# Patient Record
Sex: Male | Born: 1955 | Race: White | Hispanic: No | Smoking: Never smoker
Health system: Southern US, Community
[De-identification: ages and names within clinical notes are randomized; demographics above are authoritative.]

## PROBLEM LIST (undated history)

## (undated) DIAGNOSIS — R001 Bradycardia, unspecified: Secondary | ICD-10-CM

## (undated) DIAGNOSIS — I4719 Other supraventricular tachycardia: Secondary | ICD-10-CM

## (undated) DIAGNOSIS — F1090 Alcohol use, unspecified, uncomplicated: Secondary | ICD-10-CM

## (undated) DIAGNOSIS — R0600 Dyspnea, unspecified: Secondary | ICD-10-CM

## (undated) DIAGNOSIS — I493 Ventricular premature depolarization: Secondary | ICD-10-CM

## (undated) DIAGNOSIS — I491 Atrial premature depolarization: Secondary | ICD-10-CM

## (undated) DIAGNOSIS — D649 Anemia, unspecified: Secondary | ICD-10-CM

## (undated) DIAGNOSIS — I2510 Atherosclerotic heart disease of native coronary artery without angina pectoris: Secondary | ICD-10-CM

## (undated) DIAGNOSIS — Z7289 Other problems related to lifestyle: Secondary | ICD-10-CM

## (undated) DIAGNOSIS — Z9289 Personal history of other medical treatment: Secondary | ICD-10-CM

## (undated) DIAGNOSIS — I77819 Aortic ectasia, unspecified site: Secondary | ICD-10-CM

## (undated) DIAGNOSIS — M109 Gout, unspecified: Secondary | ICD-10-CM

## (undated) DIAGNOSIS — I209 Angina pectoris, unspecified: Secondary | ICD-10-CM

## (undated) DIAGNOSIS — I471 Supraventricular tachycardia: Secondary | ICD-10-CM

## (undated) HISTORY — DX: Atrial premature depolarization: I49.1

## (undated) HISTORY — PX: TONSILLECTOMY: SUR1361

## (undated) HISTORY — DX: Supraventricular tachycardia: I47.1

## (undated) HISTORY — DX: Atherosclerotic heart disease of native coronary artery without angina pectoris: I25.10

## (undated) HISTORY — DX: Other supraventricular tachycardia: I47.19

## (undated) HISTORY — DX: Other problems related to lifestyle: Z72.89

## (undated) HISTORY — DX: Aortic ectasia, unspecified site: I77.819

## (undated) HISTORY — DX: Bradycardia, unspecified: R00.1

## (undated) HISTORY — DX: Ventricular premature depolarization: I49.3

## (undated) HISTORY — DX: Alcohol use, unspecified, uncomplicated: F10.90

## (undated) NOTE — *Deleted (*Deleted)
[facility]   [phone] [phone]   Clinic Follow up Note   Patient Care Team: Deatra James, MD as PCP - General (Family Medicine) Malachy Mood, MD as Consulting Physician (Oncology) Radonna Ricker, RN as Registered Nurse Kathi Der, MD as Consulting Physician (Gastroenterology) Clinton Gallant, RN as [facility] Care Management Anabel Bene, RD as Dietitian (Nutrition) Corliss Skains, MD as Consulting Physician (Cardiothoracic Surgery) Willis Modena, MD as Consulting Physician (Gastroenterology)  Date of Service:  03/10/2020  CHIEF COMPLAINT: F/u ofEsophageal cancer  SUMMARY OF ONCOLOGIC HISTORY: Oncology History Overview Note  Cancer Staging Malignant neoplasm of lower third of esophagus (HCC) Staging form: Esophagus - Squamous Cell Carcinoma, AJCC 8th Edition - Clinical stage from 11/23/2019: Stage Unknown (cTX, cN1, cM0) - Signed by Malachy Mood, MD on 11/26/2019     Malignant neoplasm of lower third of esophagus (HCC)  09/14/2019 Imaging   CT AP W Contrast 09/14/19 IMPRESSION: 1. Large hiatal hernia. 2. Hepatic and bilateral simple renal cysts. 3. Colonic diverticulosis. 4. Small fat containing left inguinal hernia.   11/10/2019 Imaging   CT Chest 11/10/19  IMPRESSION: 1. There is a large mass of the lower third of the esophagus, with ill-defined margins, measuring approximately 6.0 x 5.4 x 7.7 cm. This appears enlarged and more appreciably masslike than appearance on prior CT dated 09/14/2019. 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 2. There are prominent gastrohepatic ligament lymph nodes adjacent to the lesser curvature, concerning for nodal metastatic disease although not ideally imaged on this examination of the chest. 3. No evidence of metastatic disease within in the chest. 4. Coronary artery disease.   11/22/2019 PET scan   PET 11/22/19 IMPRESSION: 1. Intensely  hypermetabolic (max SUV 20.2) lower thoracic esophageal 6.0 x 5.5 x 9.4 cm mass extending to the esophagogastric junction with probable involvement of the gastric cardia, compatible with primary esophageal malignancy. 2. Hypermetabolic gastrohepatic ligament nodal metastases. 3. No hypermetabolic liver or other distant metastases. 4. Chronic findings include: Aortic Atherosclerosis (ICD10-I70.0). Coronary atherosclerosis. Marked diffuse colonic diverticulosis.   11/23/2019 Procedure   EGD with Upper Endoscopy by Dr Dulce Sellar 11/23/19 IMPRESSION - Partially obstructing, likely malignant esophageal tumor was found in the lower third of the esophagus. Biopsied. 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 Biopsied. - Normal duodenal bulb, first portion of the duodenum and second portion of the duodenum.   11/23/2019 Initial Biopsy   FINAL MICROSCOPIC DIAGNOSIS:   A. STOMACH, CARDIA, BIOPSY:  - Invasive well-differentiated squamous cell carcinoma.  See comment   B. ESOPHAGUS, DISTAL, BIOPSY:  - Invasive well-differentiated squamous cell carcinoma.  See comment     COMMENT:   A  B.   Dr. Berneice Heinrich reviewed the case and concurs with the diagnosis.  Dr. Dulce Sellar was paged on 11/24/2019.    11/23/2019 Cancer Staging   Staging form: Esophagus - Squamous Cell Carcinoma, AJCC 8th Edition - Clinical stage from 11/23/2019: Stage Unknown (cTX, cN1, cM0) - Signed by Malachy Mood, MD on 11/26/2019   11/24/2019 Initial Diagnosis   Malignant neoplasm of lower third of esophagus (HCC)   12/06/2019 - 01/03/2020 Chemotherapy   Concurrent chemoradiation with weekly carboplatin and Taxol starting 12/06/19 - 01/13/20, last chemo 01/03/2020, cycle 6 held due to neutropenia   12/07/2019 - 01/13/2020 Radiation Therapy   Concurrent chemoradiation with Dr Mitzi Hansen starting 12/07/19      CURRENT THERAPY:  ***  INTERVAL HISTORY: *** ARIES TOWNLEY is here for a follow  up after surgery. He presents to the clinic alone.     REVIEW OF SYSTEMS:  *** Constitutional: Denies fevers, chills or abnormal weight loss Eyes: Denies blurriness of vision Ears, nose, mouth, throat, and face: Denies mucositis or sore throat Respiratory: Denies cough, dyspnea or wheezes Cardiovascular: Denies palpitation, chest discomfort or lower extremity swelling Gastrointestinal:  Denies nausea, heartburn or change in bowel habits Skin: Denies abnormal skin rashes Lymphatics: Denies new lymphadenopathy or easy bruising Neurological:Denies numbness, tingling or new weaknesses Behavioral/Psych: Mood is stable, no new changes  All other systems were reviewed with the patient and are negative.  MEDICAL HISTORY:  Past Medical History:  Diagnosis Date  . Anemia   . Anemia   . Malignant neoplasm of lower third of esophagus (HCC) 11/24/2019    SURGICAL HISTORY: Past Surgical History:  Procedure Laterality Date  . BIOPSY  11/23/2019   Procedure: BIOPSY;  Surgeon: Willis Modena, MD;  Location: [facility];  Service: Endoscopy;;  . ESOPHAGOGASTRODUODENOSCOPY N/A 02/24/2020   Procedure: ESOPHAGOGASTRODUODENOSCOPY (EGD);  Surgeon: Corliss Skains, MD;  Location[facility];  Service: Thoracic;  Laterality: N/A;  . ESOPHAGOGASTRODUODENOSCOPY (EGD) WITH PROPOFOL N/A 11/23/2019   Procedure: ESOPHAGOGASTRODUODENOSCOPY (EGD) WITH PROPOFOL;  Surgeon: Willis Modena, MD;  Location: [facility];  Service: Endoscopy;  Laterality: N/A;  . INTERCOSTAL NERVE BLOCK Right 02/24/2020   Procedure: INTERCOSTAL NERVE BLOCK;  Surgeon: Corliss Skains, MD;  Location: [facility] OR;  Service: Thoracic;  Laterality: Right;  . TONSILLECTOMY      I have reviewed the social history and family history with the patient and they are unchanged from previous note.  ALLERGIES:  has No Known Allergies.  MEDICATIONS:  No current facility-administered medications for this visit.   No current outpatient medications on file.   Facility-Administered Medications Ordered  in Other Visits  Medication Dose Route Frequency Provider Last Rate Last Admin  . acetaminophen (TYLENOL) 160 MG/5ML solution 650 mg  650 mg Per Tube Q4H PRN Corliss Skains, MD   650 mg at 03/10/20 0340  . enoxaparin (LOVENOX) injection 40 mg  40 mg Subcutaneous Q24H Mosetta Anis, RPH   40 mg at 03/09/20 1758  . feeding supplement (BOOST / RESOURCE BREEZE) liquid 1 Container  1 Container Oral TID BM Corliss Skains, MD   1 Container at 03/10/20 1000  . feeding supplement (OSMOLITE 1.5 CAL) liquid 1,520 mL  1,520 mL Per Tube Q24H Corliss Skains, MD 95 mL/hr at 03/10/20 0406 1,520 mL at 03/10/20 0406  . feeding supplement (PROSource TF) liquid 45 mL  45 mL Per Tube Daily Corliss Skains, MD   45 mL at 03/10/20 0923  . Gerhardt's butt cream   Topical Daily Corliss Skains, MD   Given at 03/08/20 1036  . guaiFENesin-dextromethorphan (ROBITUSSIN DM) 100-10 MG/5ML syrup 5 mL  5 mL Per Tube Q6H PRN Gold, Wayne E, PA-C   5 mL at 03/10/20 0923  . insulin aspart (novoLOG) injection 0-24 Units  0-24 Units Subcutaneous Q4H Rowe Clack, PA-C   2 Units at 03/10/20 0428  . levalbuterol (XOPENEX) nebulizer solution 0.63 mg  0.63 mg Nebulization Q6H PRN Corliss Skains, MD   0.63 mg at 03/06/20 0405  . morphine 2 MG/ML injection 1-4 mg  1-4 mg Intravenous Q1H PRN Gershon Crane E, PA-C   2 mg at 03/09/20 2058  . ondansetron (ZOFRAN) injection 4 mg  4 mg Intravenous Q4H PRN Gold, Wayne E, PA-C      .  pantoprazole (PROTONIX) injection 40 mg  40 mg Intravenous Q12H Gold, Wayne E, PA-C   40 mg at 03/10/20 1610  . piperacillin-tazobactam (ZOSYN) IVPB 3.375 g  3.375 g Intravenous Q8H Lightfoot, Eliezer Lofts, MD 12.5 mL/hr at 03/10/20 0646 3.375 g at 03/10/20 0646  . silver sulfADIAZINE (SILVADENE) 1 % cream   Topical BID Corliss Skains, MD   Given at 03/09/20 2054  . sorbitol 70 % solution 30 mL  30 mL Per Tube Once Doree Fudge M, PA-C      . vancomycin (VANCOREADY) IVPB  1750 mg/350 mL  1,750 mg Intravenous Q12H Corliss Skains, MD 175 mL/hr at 03/10/20 0345 1,750 mg at 03/10/20 0345    PHYSICAL EXAMINATION: ECOG PERFORMANCE STATUS: {CHL ONC ECOG PS:[id number]}  There were no vitals filed for this visit. There were no vitals filed for this visit. *** GENERAL:alert, no distress and comfortable SKIN: skin color, texture, turgor are normal, no rashes or significant lesions EYES: normal, Conjunctiva are pink and non-injected, sclera clear {OROPHARYNX:no exudate, no erythema and lips, buccal mucosa, and tongue normal}  NECK: supple, thyroid normal size, non-tender, without nodularity LYMPH:  no palpable lymphadenopathy in the cervical, axillary {or inguinal} LUNGS: clear to auscultation and percussion with normal breathing effort HEART: regular rate & rhythm and no murmurs and no lower extremity edema ABDOMEN:abdomen soft, non-tender and normal bowel sounds Musculoskeletal:no cyanosis of digits and no clubbing  NEURO: alert & oriented x 3 with fluent speech, no focal motor/sensory deficits  LABORATORY DATA:  I have reviewed the data as listed CBC Latest Ref Rng & Units 03/10/2020 03/09/2020 03/08/2020  WBC 4.0 - 10.5 K/uL 7.2 8.9 12.6(H)  Hemoglobin 13.0 - 17.0 g/dL 9.6(E) 4.5(W) 0.9(W)  Hematocrit 39 - 52 % 29.6(L) 29.6(L) 29.0(L)  Platelets 150 - 400 K/uL 347 379 355     CMP Latest Ref Rng & Units 03/10/2020 03/09/2020 03/08/2020  Glucose 70 - 99 mg/dL 119(J) 478(G) 956(O)  BUN 8 - 23 mg/dL 11 14 18   Creatinine 0.61 - 1.24 mg/dL 1.30 8.65 7.84  Sodium 135 - 145 mmol/L 134(L) 136 136  Potassium 3.5 - 5.1 mmol/L 3.6 4.0 3.7  Chloride 98 - 111 mmol/L 98 99 99  CO2 22 - 32 mmol/L 27 27 28   Calcium 8.9 - 10.3 mg/dL 7.4(L) 7.8(L) 7.8(L)  Total Protein 6.5 - 8.1 g/dL - - -  Total Bilirubin 0.3 - 1.2 mg/dL - - -  Alkaline Phos 38 - 126 U/L - - -  AST 15 - 41 U/L - - -  ALT 0 - 44 U/L - - -      RADIOGRAPHIC STUDIES: I have personally reviewed  the radiological images as listed and agreed with the findings in the report. DG CHEST PORT 1 VIEW  Result Date: 03/10/2020 CLINICAL DATA:  Pleural effusion. EXAM: PORTABLE CHEST 1 VIEW COMPARISON:  CT 03/07/2020.  Chest x-ray 03/07/2020. FINDINGS: Right chest tube in stable position. Left lower chest tube in stable position. Interim placement of a second left chest tube just above the left lower chest tube. Interim improvement of left-sided pleural effusion with residual pleural effusion present. No pneumothorax. Persistent bibasilar atelectasis/infiltrates. Small right pleural effusion. Heart size stable. IMPRESSION: Right chest tube in stable position. Left lower chest tube in stable position. Interim placement of a second left chest tube just above the left lower chest tube. Interim improvement of left-sided pleural effusion with residual moderate pleural effusion present. Persistent bibasilar atelectasis/infiltrates. Electronically Signed   By: Maisie Fus  Register   On: 03/10/2020 07:15   CT IMAGE GUIDED DRAINAGE BY PERCUTANEOUS CATHETER  Result Date: 03/08/2020 INDICATION: 70 year old male with large left hydropneumothorax status post esophagectomy and gastric pull-through procedure. He presents for CT-guided drain placement. EXAM: CT-guided chest tube MEDICATIONS: The patient is currently admitted to the hospital and receiving intravenous antibiotics. The antibiotics were administered within an appropriate time frame prior to the initiation of the procedure. ANESTHESIA/SEDATION: Fentanyl 50 mcg IV; Versed 1 mg IV Moderate Sedation Time:  16 minutes The patient was continuously monitored during the procedure by the interventional radiology nurse under my direct supervision. COMPLICATIONS: None immediate. PROCEDURE: Informed written consent was obtained from the patient after a thorough discussion of the procedural risks, benefits and alternatives. All questions were addressed. Maximal Sterile Barrier  Technique was utilized including caps, mask, sterile gowns, sterile gloves, sterile drape, hand hygiene and skin antiseptic. A timeout was performed prior to the initiation of the procedure. A planning axial CT scan was performed. The large hydropneumothorax was identified in the left hemithorax. A suitable skin entry site was selected and marked. The overlying skin was sterilely prepped and draped in the standard fashion using chlorhexidine skin prep. Local anesthesia was attained by infiltration with 1% lidocaine. A small dermatotomy was made. Under intermittent CT guidance, an 18 gauge trocar needle was advanced into the collection. A 0.035 wire was then advanced into the pleural space. The needle was removed. The skin tract was dilated to 12 [place]. A Cook 12 [place] all-purpose drainage catheter was advanced over the wire and formed in the hydropneumothorax. The catheter was connected to a pleura vac and wall suction. Follow-up CT imaging demonstrates a well-positioned percutaneous chest tube and decreasing hydropneumothorax. The catheter was secured to the skin with 0 Prolene suture. Sterile bandages were applied. IMPRESSION: Successful placement of a left-sided 60 French percutaneous thoracostomy tube. Electronically Signed   By: Malachy Moan M.D.   On: 03/08/2020 16:27     ASSESSMENT & PLAN:  JAMONT MELLIN is a 60 y.o. male with   1.Esophagealsquamous cell carcinoma, in distalesophagus,cTxN1M0 -The patient's work-up began in May 2021 after presenting with symptomatic anemia and syncope. -The patient's colonoscopy and an endoscopy was performed on 11/09/2019. The endoscopy was significant for a large esophageal tumor which is circumferential and non-obstructive around the distal esophagus with invasion into the cardia. The path noted ulcerated, highly atypical squamous cell epithelium. -11/10/19 The patient then had a CT scan of the chest which noted a large mass in the lower third of the  esophagus with ill-defined margins, measuring approximately 6.0 x 5.4 x 7.7 cm. The scan also demonstrated prominent gastrohepatic ligament lymph node adjacent to the lesser curvature. -His 7/19/21PET showed hypermetabolic low thoracic esophageal mass, and hypermetabolic gastrohepatic ligament node,no other node or distant metastasis. -He underwentsecondEGDand biopsyby Dr Dulce Sellar on 11/23/19.Hispathology showed Invasive well-differentiated squamous cell carcinomaof esophaguswhich extend tostomach cardia. -Patient started concurrent chemoradiation with weekly carboplatin and Taxol on8/2/21. He has been tolerating well so far  -S/p week 3 his weight is slowly trending down and his swallowing is improving.  -Labs reviewed, CBC and CMP WNL except WBC 3.3, Hg 10.5, BG 117, albumin 3.3. Overall adequate to proceed with week 3 CT today at same dose.  -Continue RT  -Will schedule EUS with Dr. Dulce Sellar after he completes chemoRT and PET scan 4 weeks after. He will f/u with Surgeon Dr Cliffton Asters after chemoRT -Follow-up weekly   2. Iron deficiency anemia secondary to blood loss -Secondary  to #1 -The patient received blood transfusion on 09/27/19 and 1 dose Feraheme on 09/18/19 while in the hospital for symptomatic anemia.Due to persistent low iron and anemia, he received 2 more IV iron doses in 11/2019. -Anemia now mild and stable.   3. Weight loss and dysphagia -He initially presented with mild-moderate dysphagia. His mass is nonobstructive -The patient reports losing approximately 60 pounds over 6 months since January 2021 -He was seen by dietitian -S/p week 4 his dysphagia is improving and able to eat most solid foods. He will continue sucralfate. His weight is slowly trending down, I encouraged him to continue nutritional supplement and eat enough to maintain weight.     PLAN: -He is tolerating treatment well -Labs reviewed and adequate to proceed with week 4 CT today  -Continue RT   -Follow-up next week -He will call Dr Cliffton Asters to schedule his f/u with him -will send him back to Dr. Dulce Sellar for EUS after last cycle chemoRT   No problem-specific Assessment & Plan notes found for this encounter.   No orders of the defined types were placed in this encounter.  All questions were answered. The patient knows to call the clinic with any problems, questions or concerns. No barriers to learning was detected. The total time spent in the appointment was {CHL ONC TIME VISIT - [id number]}.     Delphina Cahill 03/10/2020   Rogelia Rohrer, am acting as scribe for Malachy Mood, MD.   {Add scribe attestation statement}

---

## 2011-07-29 ENCOUNTER — Emergency Department (HOSPITAL_COMMUNITY)
Admission: EM | Admit: 2011-07-29 | Discharge: 2011-07-29 | Disposition: A | Discharge status: Home / Self Care | Payer: Self-pay | Attending: Emergency Medicine | Admitting: Emergency Medicine

## 2011-07-29 ENCOUNTER — Encounter (HOSPITAL_COMMUNITY): Payer: Self-pay | Admitting: Emergency Medicine

## 2011-07-29 DIAGNOSIS — W261XXA Contact with sword or dagger, initial encounter: Secondary | ICD-10-CM | POA: Insufficient documentation

## 2011-07-29 DIAGNOSIS — F17200 Nicotine dependence, unspecified, uncomplicated: Secondary | ICD-10-CM | POA: Insufficient documentation

## 2011-07-29 DIAGNOSIS — S61412A Laceration without foreign body of left hand, initial encounter: Secondary | ICD-10-CM

## 2011-07-29 DIAGNOSIS — W260XXA Contact with knife, initial encounter: Secondary | ICD-10-CM | POA: Insufficient documentation

## 2011-07-29 DIAGNOSIS — S61409A Unspecified open wound of unspecified hand, initial encounter: Secondary | ICD-10-CM | POA: Insufficient documentation

## 2011-07-29 DIAGNOSIS — Z23 Encounter for immunization: Secondary | ICD-10-CM | POA: Insufficient documentation

## 2011-07-29 MED ORDER — TETANUS-DIPHTH-ACELL PERTUSSIS 5-2.5-18.5 LF-MCG/0.5 IM SUSP
0.5000 mL | Freq: Once | INTRAMUSCULAR | Status: AC
  Administered 2011-07-29: 0.5 mL via INTRAMUSCULAR
  Filled 2011-07-29: qty 0.5

## 2011-07-29 MED ORDER — CEPHALEXIN 500 MG PO CAPS: 500.0000 mg | ORAL_CAPSULE | Freq: Four times a day (QID) | ORAL | Status: AC

## 2011-07-29 NOTE — ED Notes (Signed)
Pt with left hand laceration from razor; bleeding under control; pt sts not utd on TD

## 2011-07-29 NOTE — ED Provider Notes (Signed)
History     CSN: [id number]  Arrival date & time 07/29/11  1335   First MD Initiated Contact with Patient 07/29/11 1552      Chief Complaint  Patient presents with  . Extremity Laceration    (Consider location/radiation/quality/duration/timing/severity/associated sxs/prior treatment) Patient is a 56 y.o. male presenting with skin laceration. The history is provided by the patient.  Laceration  The incident occurred 1 to 2 hours ago. The laceration is located on the left hand. The laceration is 4 cm in size. The laceration mechanism was a a dirty knife. The pain is at a severity of 5/10. The pain is moderate. The pain has been constant since onset. He reports no foreign bodies present. His tetanus status is out of date.    History reviewed. No pertinent past medical history.  History reviewed. No pertinent past surgical history.  History reviewed. No pertinent family history.  History  Substance Use Topics  . Smoking status: Current Some Day Smoker  . Smokeless tobacco: Not on file  . Alcohol Use: Yes      Review of Systems  Constitutional: Negative for fever.  HENT: Negative for congestion, sore throat and neck pain.   Eyes: Negative.   Respiratory: Negative for chest tightness and shortness of breath.   Cardiovascular: Negative for chest pain.  Gastrointestinal: Negative for nausea and abdominal pain.  Genitourinary: Negative.   Musculoskeletal: Negative for joint swelling and arthralgias.  Skin: Positive for wound. Negative for rash.  Neurological: Negative for dizziness, weakness, light-headedness, numbness and headaches.  Hematological: Negative.   Psychiatric/Behavioral: Negative.     Allergies  Review of patient's allergies indicates no known allergies.  Home Medications   Current Outpatient Rx  Name Route Sig Dispense Refill  . IBUPROFEN 200 MG PO TABS Oral Take 200-400 mg by mouth every 6 (six) hours as needed. As needed for back pain.    . CEPHALEXIN  500 MG PO CAPS Oral Take 1 capsule (500 mg total) by mouth 4 (four) times daily. 20 capsule 0    BP 144/75  Pulse 66  Temp 98.3 F (36.8 C)  Resp 18  SpO2 96%  Physical Exam  Nursing note and vitals reviewed. Constitutional: He is oriented to person, place, and time. He appears well-developed and well-nourished.  HENT:  Head: Normocephalic and atraumatic.  Eyes: Conjunctivae are normal.  Neck: Normal range of motion.  Cardiovascular: Normal rate, regular rhythm, normal heart sounds and intact distal pulses.   Pulmonary/Chest: Effort normal and breath sounds normal. He has no wheezes.  Abdominal: Soft. Bowel sounds are normal. There is no tenderness.  Musculoskeletal: Normal range of motion.  Neurological: He is alert and oriented to person, place, and time.  Skin: Skin is warm and dry. No erythema.       4 cm laceration, subcutaneous left palm of hand proximal to index and third fingers.  No tendon involvement.  Patient has full range of motion and full-strength of all of his digits.  He does have lateral numbness to his distal index finger.  Less than 2 second cap refill present.  Psychiatric: He has a normal mood and affect.    ED Course  Procedures (including critical care time)  Labs Reviewed - No data to display No results found.   1. Laceration of left hand    LACERATION REPAIR Performed by: Candis Musa Authorized by: Candis Musa Consent: Verbal consent obtained. Risks and benefits: risks, benefits and alternatives were discussed Consent given by: patient Patient  identity confirmed: provided demographic data Prepped and Draped in normal sterile fashion Wound explored  Laceration Location: Left hand  Laceration Length: 4 cm  No Foreign Bodies seen or palpated  Anesthesia: local infiltration  Local anesthetic: lidocaine 2 % with epinephrine  Anesthetic total: 4 ml  Irrigation method: syringe Amount of cleaning: standard  Skin closure:  suture  Number of sutures: 8 simple interrupted   Technique: Simple interrupted   Patient tolerance: Patient tolerated the procedure well with no immediate complications.    MDM  Patient's tetanus was updated today.  He was prescribed Keflex 500 mg 4 times a day for 5 days.  Suture removal in 10 days at our urgent care Center, return sooner for any signs of infection including redness swelling increased pain or drainage of pus.        Candis Musa, PA 07/29/11 1706

## 2011-07-29 NOTE — ED Notes (Signed)
Discharge instructions reviewed with pt; verbalizes understanding.  No allergic reaction noted from tetanus shot.  No questions asked; no further c/o's voiced.  Pt ambulatory to lobby.  NAD noted.

## 2011-07-29 NOTE — Discharge Instructions (Signed)

## 2011-07-31 NOTE — ED Provider Notes (Signed)
Medical screening examination/treatment/procedure(s) were performed by non-physician practitioner and as supervising physician I was immediately available for consultation/collaboration.   Carleene Cooper III, MD 07/31/11 1014

## 2011-08-11 ENCOUNTER — Emergency Department (INDEPENDENT_AMBULATORY_CARE_PROVIDER_SITE_OTHER)
Admission: EM | Admit: 2011-08-11 | Discharge: 2011-08-11 | Disposition: A | Discharge status: Home / Self Care | Payer: Self-pay | Source: Home / Self Care | Attending: Family Medicine | Admitting: Family Medicine

## 2011-08-11 ENCOUNTER — Encounter (HOSPITAL_COMMUNITY): Payer: Self-pay | Admitting: *Deleted

## 2011-08-11 DIAGNOSIS — Z4802 Encounter for removal of sutures: Secondary | ICD-10-CM

## 2011-08-11 NOTE — Discharge Instructions (Signed)
Staple Removal  Care After  The staples used to close your skin have been removed. The wound needs continued care so it can heal completely and without problems. The care described here will need to be done for another 5-10 days unless your caregiver advises otherwise.   HOME CARE INSTRUCTIONS    Keep wound site dry and clean.   If skin adhesive strips were applied after the staples were removed, they will begin to peel off in a few days. If they remain after fourteen days, they may be peeled off and discarded.   If you still have a dressing, change it at least once a day or as instructed by your caregiver. If the bandage sticks, soak it off with warm water. Pat dry with a clean towel. Look for signs of infection (see below).   Reapply cream or ointment according to your caregiver's instruction. This will help prevent infection and keep the bandage from sticking. Use of a non-stick material over the wound and under the dressing or wrap will also help keep the bandage from sticking.   If the bandage becomes wet, dirty or develops a foul smell, change it as soon as possible.   New scars become sunburned easily. Use sunscreens with protection factor (SPF) of at least 15 when out in the sun.   Only take over-the-counter or prescription medicines for pain, discomfort or fever as directed by your caregiver.  SEEK IMMEDIATE MEDICAL CARE IF:    There is redness, swelling or increasing pain in the wound.   Pus is coming from the wound.   An unexplained oral temperature above 102 F (38.9 C) develops.   You notice a foul smell coming from the wound or dressing.   There is a breaking open of the suture line (edges not staying together) of the wound edges after staples have been removed.  Document Released: 04/04/2008 Document Revised: 04/11/2011 Document Reviewed: 04/04/2008  [employer] Patient Information 2012 [employer]

## 2011-08-11 NOTE — ED Provider Notes (Signed)
History     CSN: [id number]  Arrival date & time 08/11/11  1057   First MD Initiated Contact with Patient 08/11/11 1108      Chief Complaint  Patient presents with  . Suture / Staple Removal    (Consider location/radiation/quality/duration/timing/severity/associated sxs/prior treatment) Patient is a 56 y.o. male presenting with suture removal. The history is provided by the patient.  Suture / Staple Removal  The sutures were placed 11 to 14 days ago. There has been no treatment since the wound repair. There has been no drainage from the wound. There is no redness (Point finger left hand with numbness) present.    History reviewed. No pertinent past medical history.  History reviewed. No pertinent past surgical history.  History reviewed. No pertinent family history.  History  Substance Use Topics  . Smoking status: Never Smoker   . Smokeless tobacco: Not on file  . Alcohol Use: Yes      Review of Systems  All other systems reviewed and are negative.    Allergies  Review of patient's allergies indicates no known allergies.  Home Medications   Current Outpatient Rx  Name Route Sig Dispense Refill  . IBUPROFEN 200 MG PO TABS Oral Take 200-400 mg by mouth every 6 (six) hours as needed. As needed for back pain.      BP 129/89  Pulse 70  Temp(Src) 98.1 F (36.7 C) (Oral)  Resp 16  SpO2 96%  Physical Exam  Constitutional: He is oriented to person, place, and time. He appears well-developed and well-nourished.  Musculoskeletal: Normal range of motion.       Hand healing well. Sutures intact . Patient indicates that some come out on their own.  Neurological: He is alert and oriented to person, place, and time.  Skin: Skin is warm and dry.  Psychiatric: He has a normal mood and affect.    ED Course  SUTURE REMOVAL Performed by: Hassan Rowan Authorized by: Hassan Rowan Consent: Verbal consent not obtained. Written consent not obtained. Body area: upper  extremity Location details: left hand Wound Appearance: clean Sutures Removed: 5 Patient tolerance: Patient tolerated the procedure well with no immediate complications.   (including critical care time)  cfollow up as needed  MDM          Hassan Rowan, MD 08/11/11 2151

## 2011-08-11 NOTE — ED Notes (Cosign Needed)
Sutures palm left hand 3/25 [place] ed - per pt 2 sutures fell out - no redness or swelling at site

## 2017-05-20 ENCOUNTER — Ambulatory Visit: Payer: Self-pay

## 2017-05-20 NOTE — Telephone Encounter (Signed)
Patient called in with "back pain." He has an appointment for 05/21/17 already scheduled for this issue. He asked "what medication can I take, Tylenol, Advil, Aleve?" I advised he can take either one. He said "I've been taking Advil, so I will just keep doing that."

## 2017-05-21 ENCOUNTER — Other Ambulatory Visit: Payer: Self-pay

## 2017-05-21 ENCOUNTER — Encounter: Payer: Self-pay | Admitting: Physician Assistant

## 2017-05-21 ENCOUNTER — Ambulatory Visit: Payer: BLUE CROSS/BLUE SHIELD | Admitting: Physician Assistant

## 2017-05-21 VITALS — BP 148/92 | HR 76 | Temp 98.4°F | Resp 16 | Ht 71.0 in | Wt 302.0 lb

## 2017-05-21 DIAGNOSIS — M79604 Pain in right leg: Secondary | ICD-10-CM | POA: Diagnosis not present

## 2017-05-21 DIAGNOSIS — M5431 Sciatica, right side: Secondary | ICD-10-CM | POA: Diagnosis not present

## 2017-05-21 DIAGNOSIS — M545 Low back pain, unspecified: Secondary | ICD-10-CM

## 2017-05-21 LAB — POCT URINALYSIS DIP (MANUAL ENTRY)
Bilirubin, UA: NEGATIVE
Blood, UA: NEGATIVE
Glucose, UA: NEGATIVE mg/dL
Ketones, POC UA: NEGATIVE mg/dL
Leukocytes, UA: NEGATIVE
Nitrite, UA: NEGATIVE
Spec Grav, UA: >=1.03 — AB (ref 1.010–1.025)
Urobilinogen, UA: 2 E.U./dL — AB
pH, UA: 5.5 (ref 5.0–8.0)

## 2017-05-21 LAB — POCT GLYCOSYLATED HEMOGLOBIN (HGB A1C): Hemoglobin A1C: 5.6

## 2017-05-21 LAB — POC MICROSCOPIC URINALYSIS (UMFC): Mucus: ABSENT

## 2017-05-21 MED ORDER — TRAMADOL HCL 50 MG PO TABS: 50.0000 mg | ORAL_TABLET | Freq: Three times a day (TID) | ORAL | 0 refills | Status: DC | PRN

## 2017-05-21 MED ORDER — PREDNISONE 20 MG PO TABS: ORAL_TABLET | ORAL | 0 refills | Status: DC

## 2017-05-21 MED ORDER — MELOXICAM 15 MG PO TABS: 15.0000 mg | ORAL_TABLET | Freq: Every day | ORAL | 0 refills | Status: DC

## 2017-05-21 NOTE — Patient Instructions (Addendum)
Prednisone is a steroid - this will help reduce inflammation. Take this as directed (three days each of 60 mg, 40 mg, and 20 mg per day) Meloxicam is an NSAID - this will also help with inflammation. Do not use with any other otc pain medication other than tylenol/acetaminophen - so no aleve, ibuprofen, motrin, advil, etc. Tramadol is an opoid pain medication. This may make you drowsy. Do not take this if you need to work or drive.   Activity modification is as important as pain relieving treatment in the management of sciatica. The goals are to lessen nerve root impingement and to avoid activities that make your pain worse.  Come back and see me if you are not improving in 3-4 weeks.    Thank you for coming in today. I hope you feel we met your needs. Feel free to call PCP if you have any questions or further requests. Please consider signing up for MyChart if you do not already have it, as this is a great way to communicate with me.  Best,  Whitney McVey, PA-C   Sciatica Sciatica is pain, numbness, weakness, or tingling along the path of the sciatic nerve. The sciatic nerve starts in the lower back and runs down the back of each leg. The nerve controls the muscles in the lower leg and in the back of the knee. It also provides feeling (sensation) to the back of the thigh, the lower leg, and the sole of the foot. Sciatica is a symptom of another medical condition that pinches or puts pressure on the sciatic nerve. Generally, sciatica only affects one side of the body. Sciatica usually goes away on its own or with treatment. In some cases, sciatica may keep coming back (recur). What are the causes? This condition is caused by pressure on the sciatic nerve, or pinching of the sciatic nerve. This may be the result of:  A disk in between the bones of the spine (vertebrae) bulging out too far (herniated disk).  Age-related changes in the spinal disks (degenerative disk disease).  A pain disorder  that affects a muscle in the buttock (piriformis syndrome).  Extra bone growth (bone spur) near the sciatic nerve.  An injury or break (fracture) of the pelvis.  Pregnancy.  Tumor (rare).  What increases the risk? The following factors may make you more likely to develop this condition:  Playing sports that place pressure or stress on the spine, such as football or weight lifting.  Having poor strength and flexibility.  A history of back injury.  A history of back surgery.  Sitting for long periods of time.  Doing activities that involve repetitive bending or lifting.  Obesity.  What are the signs or symptoms? Symptoms can vary from mild to very severe, and they may include:  Any of these problems in the lower back, leg, hip, or buttock: ? Mild tingling or dull aches. ? Burning sensations. ? Sharp pains.  Numbness in the back of the calf or the sole of the foot.  Leg weakness.  Severe back pain that makes movement difficult.  These symptoms may get worse when you cough, sneeze, or laugh, or when you sit or stand for long periods of time. Being overweight may also make symptoms worse. In some cases, symptoms may recur over time. How is this diagnosed? This condition may be diagnosed based on:  Your symptoms.  A physical exam. Your health care provider may ask you to do certain movements to check whether  those movements trigger your symptoms.  You may have tests, including: ? Blood tests. ? X-rays. ? MRI. ? CT scan.  How is this treated? In many cases, this condition improves on its own, without any treatment. However, treatment may include:  Reducing or modifying physical activity during periods of pain.  Exercising and stretching to strengthen your abdomen and improve the flexibility of your spine.  Icing and applying heat to the affected area.  Medicines that help: ? To relieve pain and swelling. ? To relax your muscles.  Injections of medicines  that help to relieve pain, irritation, and inflammation around the sciatic nerve (steroids).  Surgery.  Follow these instructions at home: Medicines  Take over-the-counter and prescription medicines only as told by your health care provider.  Do not drive or operate heavy machinery while taking prescription pain medicine. Managing pain  If directed, apply ice to the affected area. ? Put ice in a plastic bag. ? Place a towel between your skin and the bag. ? Leave the ice on for 20 minutes, 2-3 times a day.  After icing, apply heat to the affected area before you exercise or as often as told by your health care provider. Use the heat source that your health care provider recommends, such as a moist heat pack or a heating pad. ? Place a towel between your skin and the heat source. ? Leave the heat on for 20-30 minutes. ? Remove the heat if your skin turns bright red. This is especially important if you are unable to feel pain, heat, or cold. You may have a greater risk of getting burned. Activity  Return to your normal activities as told by your health care provider. Ask your health care provider what activities are safe for you. ? Avoid activities that make your symptoms worse.  Take brief periods of rest throughout the day. Resting in a lying or standing position is usually better than sitting to rest. ? When you rest for longer periods, mix in some mild activity or stretching between periods of rest. This will help to prevent stiffness and pain. ? Avoid sitting for long periods of time without moving. Get up and move around at least one time each hour.  Exercise and stretch regularly, as told by your health care provider.  Do not lift anything that is heavier than 10 lb (4.5 kg) while you have symptoms of sciatica. When you do not have symptoms, you should still avoid heavy lifting, especially repetitive heavy lifting.  When you lift objects, always use proper lifting technique,  which includes: ? Bending your knees. ? Keeping the load close to your body. ? Avoiding twisting. General instructions  Use good posture. ? Avoid leaning forward while sitting. ? Avoid hunching over while standing.  Maintain a healthy weight. Excess weight puts extra stress on your back and makes it difficult to maintain good posture.  Wear supportive, comfortable shoes. Avoid wearing high heels.  Avoid sleeping on a mattress that is too soft or too hard. A mattress that is firm enough to support your back when you sleep may help to reduce your pain.  Keep all follow-up visits as told by your health care provider. This is important. Contact a health care provider if:  You have pain that wakes you up when you are sleeping.  You have pain that gets worse when you lie down.  Your pain is worse than you have experienced in the past.  Your pain lasts longer than 4  weeks.  You experience unexplained weight loss. Get help right away if:  You lose control of your bowel or bladder (incontinence).  You have: ? Weakness in your lower back, pelvis, buttocks, or legs that gets worse. ? Redness or swelling of your back. ? A burning sensation when you urinate. This information is not intended to replace advice given to you by your health care provider. Make sure you discuss any questions you have with your health care provider. Document Released: 04/16/2001 Document Revised: 09/26/2015 Document Reviewed: 12/30/2014 Elsevier Interactive Patient Education  2018 [employer].   IF you received an x-ray today, you will receive an invoice from [facility]. Please contact [facility] at [phone] with questions or concerns regarding your invoice.   IF you received labwork today, you will receive an invoice from [place]. Please contact [facility] at [phone] with questions or concerns regarding your invoice.   Our billing staff will not be able to assist you with  questions regarding bills from these companies.  You will be contacted with the lab results as soon as they are available. The fastest way to get your results is to activate your My Chart account. Instructions are located on the last page of this paperwork. If you have not heard from [place] regarding the results in 2 weeks, please contact this office.

## 2017-05-21 NOTE — Progress Notes (Signed)
Brett Dawson  MRN: [id number] DOB: November 25, 1955  PCP: Default, Provider, MD  Subjective:  Pt is a 62 year old male who presents to clinic for right low back pain x 5 days. Pain radiates down right leg. He has a tough time finding a position of comfort. Pain is 8/10. Pain is constant. Nothing makes it better. Has not taken anything to feel better.  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 Pain is worsening over the past few days. No MOI.  He had sciatica 20 years ago.  He has appt next week at [facility] physicians for a check up. Has not seen medical provider in about 20 years.  Denies muscle weakness, saddle anesthesia, loss of bowel or bladder function, n/t LES.   Review of Systems  Constitutional: Negative for diaphoresis and fatigue.  Musculoskeletal: Positive for arthralgias (right leg), back pain and gait problem.  Skin: Negative.   Neurological: Negative for weakness and numbness.  Psychiatric/Behavioral: Positive for sleep disturbance.    There are no active problems to display for this patient.   Current Outpatient Medications on File Prior to Visit  Medication Sig Dispense Refill  . ibuprofen (ADVIL,MOTRIN) 200 MG tablet Take 200-400 mg by mouth every 6 (six) hours as needed. As needed for back pain.     No current facility-administered medications on file prior to visit.     No Known Allergies   Objective:  BP (!) 148/92   Pulse 76   Temp 98.4 F (36.9 C) (Oral)   Resp 16   Ht 5\' 11"  (1.803 m)   Wt (!) 302 lb (137 kg)   SpO2 95%   BMI 42.12 kg/m   Physical Exam  Constitutional: He is oriented to person, place, and time and well-developed, well-nourished, and in no distress. No distress.  obese  Musculoskeletal:       Lumbar back: He exhibits normal range of motion, no tenderness, no bony tenderness, no pain and no spasm.  Neurological: He is alert and oriented to person, place, and time. He has normal sensation and normal  strength. He has an abnormal Straight Leg Raise Test (right leg). GCS score is 15.  Pt lies supine with knees bent to alleviate back pain. Appears comfortable in seated position.   Skin: Skin is warm and dry.  Psychiatric: Mood, memory, affect and judgment normal.  Vitals reviewed.  Results for orders placed or performed in visit on 05/21/17  POCT urinalysis dipstick  Result Value Ref Range   Color, UA yellow yellow   Clarity, UA clear clear   Glucose, UA negative negative mg/dL   Bilirubin, UA negative negative   Ketones, POC UA negative negative mg/dL   Spec Grav, UA >=3.086 (A) 1.010 - 1.025   Blood, UA negative negative   pH, UA 5.5 5.0 - 8.0   Protein Ur, POC trace (A) negative mg/dL   Urobilinogen, UA 2.0 (A) 0.2 or 1.0 E.U./dL   Nitrite, UA Negative Negative   Leukocytes, UA Negative Negative  POCT glycosylated hemoglobin (Hb A1C)  Result Value Ref Range   Hemoglobin A1C 5.6     Assessment and Plan :  1. Sciatica of right side - meloxicam (MOBIC) 15 MG tablet; Take 1 tablet (15 mg total) by mouth daily.  Dispense: 30 tablet; Refill: 0 - traMADol (ULTRAM) 50 MG tablet; Take 1 tablet (50 mg total) by mouth every 8 (eight) hours as needed.  Dispense: 30 tablet; Refill:  0 - predniSONE (DELTASONE) 20 MG tablet; Take 3 PO QAM x3days, 2 PO QAM x3days, 1 PO QAM x3days  Dispense: 18 tablet; Refill: 0 - Pt c/o right leg pain. +SLR on PE, plan to treat for sciatica. Discussed activity modifications. POCT A1C is 5.6%. UA is negative for UTI.  RTC in 3-4 weeks if no improvement. Consider referral or imaging.  2. Low back pain radiating to right lower extremity 3. Pain of right lower extremity - POCT urinalysis dipstick - POCT Microscopic Urinalysis ([facility]) - POCT glycosylated hemoglobin (Hb A1C)   Whitney Nickalas Mccarrick, PA-C  Primary [facility] at [facility] 05/21/2017 10:20 AM

## 2017-05-26 DIAGNOSIS — M5431 Sciatica, right side: Secondary | ICD-10-CM | POA: Diagnosis not present

## 2017-05-26 DIAGNOSIS — R062 Wheezing: Secondary | ICD-10-CM | POA: Diagnosis not present

## 2017-05-26 DIAGNOSIS — R030 Elevated blood-pressure reading, without diagnosis of hypertension: Secondary | ICD-10-CM | POA: Diagnosis not present

## 2018-08-17 DIAGNOSIS — M62838 Other muscle spasm: Secondary | ICD-10-CM | POA: Diagnosis not present

## 2019-02-26 DIAGNOSIS — K219 Gastro-esophageal reflux disease without esophagitis: Secondary | ICD-10-CM | POA: Diagnosis not present

## 2019-09-14 ENCOUNTER — Emergency Department (HOSPITAL_COMMUNITY)
Admission: EM | Admit: 2019-09-14 | Discharge: 2019-09-14 | Disposition: A | Discharge status: Home / Self Care | Payer: 59 | Attending: Emergency Medicine | Admitting: Emergency Medicine

## 2019-09-14 ENCOUNTER — Other Ambulatory Visit: Payer: Self-pay

## 2019-09-14 ENCOUNTER — Emergency Department (HOSPITAL_COMMUNITY): Payer: 59

## 2019-09-14 ENCOUNTER — Encounter (HOSPITAL_COMMUNITY): Payer: Self-pay | Admitting: Emergency Medicine

## 2019-09-14 DIAGNOSIS — D649 Anemia, unspecified: Secondary | ICD-10-CM | POA: Diagnosis not present

## 2019-09-14 DIAGNOSIS — R112 Nausea with vomiting, unspecified: Secondary | ICD-10-CM | POA: Insufficient documentation

## 2019-09-14 DIAGNOSIS — K7689 Other specified diseases of liver: Secondary | ICD-10-CM | POA: Insufficient documentation

## 2019-09-14 DIAGNOSIS — K449 Diaphragmatic hernia without obstruction or gangrene: Secondary | ICD-10-CM | POA: Diagnosis not present

## 2019-09-14 DIAGNOSIS — N281 Cyst of kidney, acquired: Secondary | ICD-10-CM | POA: Insufficient documentation

## 2019-09-14 DIAGNOSIS — Z79899 Other long term (current) drug therapy: Secondary | ICD-10-CM | POA: Diagnosis not present

## 2019-09-14 LAB — COMPREHENSIVE METABOLIC PANEL
ALT: 16 U/L (ref 0–44)
AST: 19 U/L (ref 15–41)
Albumin: 3.3 g/dL — ABNORMAL LOW (ref 3.5–5.0)
Alkaline Phosphatase: 62 U/L (ref 38–126)
Anion gap: 12 (ref 5–15)
BUN: 16 mg/dL (ref 8–23)
CO2: 24 mmol/L (ref 22–32)
Calcium: 8.9 mg/dL (ref 8.9–10.3)
Chloride: 100 mmol/L (ref 98–111)
Creatinine, Ser: 0.84 mg/dL (ref 0.61–1.24)
GFR calc Af Amer: >60 mL/min (ref >60)
GFR calc non Af Amer: >60 mL/min (ref >60)
Glucose, Bld: 104 mg/dL — ABNORMAL HIGH (ref 70–99)
Potassium: 3.9 mmol/L (ref 3.5–5.1)
Sodium: 136 mmol/L (ref 135–145)
Total Bilirubin: 0.7 mg/dL (ref 0.3–1.2)
Total Protein: 7.2 g/dL (ref 6.5–8.1)

## 2019-09-14 LAB — CBC
HCT: 22.4 % — ABNORMAL LOW (ref 39.0–52.0)
Hemoglobin: 6.7 g/dL — CL (ref 13.0–17.0)
MCH: 24.3 pg — ABNORMAL LOW (ref 26.0–34.0)
MCHC: 29.9 g/dL — ABNORMAL LOW (ref 30.0–36.0)
MCV: 81.2 fL (ref 80.0–100.0)
Platelets: 417 10*3/uL — ABNORMAL HIGH (ref 150–400)
RBC: 2.76 MIL/uL — ABNORMAL LOW (ref 4.22–5.81)
RDW: 15.5 % (ref 11.5–15.5)
WBC: 9.4 10*3/uL (ref 4.0–10.5)
nRBC: 0 % (ref 0.0–0.2)

## 2019-09-14 LAB — PREPARE RBC (CROSSMATCH)

## 2019-09-14 LAB — BPAM RBC: Unit Type and Rh: 1700

## 2019-09-14 LAB — ABO/RH: ABO/RH(D): B NEG

## 2019-09-14 LAB — POC OCCULT BLOOD, ED: Fecal Occult Bld: NEGATIVE

## 2019-09-14 LAB — TYPE AND SCREEN

## 2019-09-14 MED ORDER — SODIUM CHLORIDE 0.9 % IV SOLN
10.0000 mL/h | Freq: Once | INTRAVENOUS | Status: AC
  Administered 2019-09-14: 10 mL/h via INTRAVENOUS

## 2019-09-14 MED ORDER — IOHEXOL 300 MG/ML  SOLN
100.0000 mL | Freq: Once | INTRAMUSCULAR | Status: AC | PRN
  Administered 2019-09-14: 100 mL via INTRAVENOUS

## 2019-09-14 NOTE — ED Notes (Signed)
Pt stated that he is feeling fine. Repositioned him and bed. He is in good spirits.

## 2019-09-14 NOTE — ED Notes (Signed)
Patient transported to CT 

## 2019-09-14 NOTE — ED Triage Notes (Signed)
Pt states he had a f/u with [facility] today and had blood work and was told to come to ER due to an abnormal lab. Pt unsure exactly which lab or the value but did state he was placed on iron tablets last visit. Pt has no other complaints.

## 2019-09-14 NOTE — ED Notes (Signed)
Blood ready in blood bank

## 2019-09-14 NOTE — ED Provider Notes (Signed)
[facility] EMERGENCY DEPARTMENT Provider Note   CSN: [id number] Arrival date & time: 09/14/19  1502    History Chief Complaint  Patient presents with  . Abnormal Lab    Brett Dawson is a 64 y.o. male with past medical history significant for as a child who presents for evaluation of abnormal lab.  Was noted to be anemic at PCP office 1 month ago.  Started on iron.  Had recheck of labs today which noted worsening anemia.  Has been compliant with his medications.  Does note constipation and black stools which she thinks is likely due to his iron supplements.  Otherwise states he feels well.  Does have recurrent regurgitation of food which he describes as reflux.  Has noted a 40 pound weight loss over the last few months. No hemoptysis, vomiting, headache, lightheadedness, dizziness, chest pain, shortness of breath, abdominal pain, dysuria. Denies aggravating or leading factors. Has noticed appetite change due to reflux.  History obtained from patient and past medical records. No interpreter is used.  HPI    History reviewed. No pertinent past medical history.  There are no problems to display for this patient.   No past surgical history on file.     Family History  Problem Relation Age of Onset  . Hyperlipidemia Mother     Social History   Tobacco Use  . Smoking status: Never Smoker  . Smokeless tobacco: Never Used  Substance Use Topics  . Alcohol use: Yes    Alcohol/week: 40.0 standard drinks    Types: 40 Standard drinks or equivalent per week  . Drug use: Yes    Types: Marijuana    Home Medications Prior to Admission medications   Medication Sig Start Date End Date Taking? Authorizing Provider  FEROSUL 325 (65 Fe) MG tablet Take 325 mg by mouth daily. 07/27/19  Yes [provider]  ibuprofen (ADVIL,MOTRIN) 200 MG tablet Take 200-400 mg by mouth every 6 (six) hours as needed (back pain).    Yes [provider]  pantoprazole  (PROTONIX) 20 MG tablet Take 20 mg by mouth daily. 09/14/19  Yes [provider]  meloxicam (MOBIC) 15 MG tablet Take 1 tablet (15 mg total) by mouth daily. Patient not taking: Reported on 09/14/2019 05/21/17   McVey, Gelene Mink, PA-C  predniSONE (DELTASONE) 20 MG tablet Take 3 PO QAM x3days, 2 PO QAM x3days, 1 PO QAM x3days Patient not taking: Reported on 09/14/2019 05/21/17   McVey, Gelene Mink, PA-C  traMADol (ULTRAM) 50 MG tablet Take 1 tablet (50 mg total) by mouth every 8 (eight) hours as needed. Patient not taking: Reported on 09/14/2019 05/21/17   McVey, Gelene Mink, PA-C    Allergies    Patient has no known allergies.  Review of Systems   Review of Systems  Constitutional: Positive for appetite change.  HENT: Negative.   Respiratory: Negative.   Cardiovascular: Negative.   Gastrointestinal: Negative.        "reflux"  Genitourinary: Negative.        Dark stools  Musculoskeletal: Negative.   Skin: Negative.   Neurological: Negative.   All other systems reviewed and are negative.   Physical Exam Updated Vital Signs BP 109/75   Pulse 67   Temp 98.4 F (36.9 C)   Resp 11   Ht 5\' 11"  (1.803 m)   Wt 101.6 kg   SpO2 100%   BMI 31.24 kg/m   Physical Exam Vitals and nursing note reviewed.  Constitutional:  General: He is not in acute distress.    Appearance: He is well-developed. He is not ill-appearing, toxic-appearing or diaphoretic.  HENT:     Head: Normocephalic and atraumatic.     Nose: Nose normal.     Mouth/Throat:     Mouth: Mucous membranes are moist.  Eyes:     Pupils: Pupils are equal, round, and reactive to light.  Cardiovascular:     Rate and Rhythm: Normal rate and regular rhythm.     Pulses: Normal pulses.     Heart sounds: Normal heart sounds.  Pulmonary:     Effort: Pulmonary effort is normal. No respiratory distress.     Breath sounds: Normal breath sounds.  Abdominal:     General: Bowel sounds are normal. There is  no distension.     Palpations: Abdomen is soft.     Tenderness: There is no abdominal tenderness. There is no right CVA tenderness, left CVA tenderness, guarding or rebound.  Genitourinary:    Rectum: Guaiac result negative. No mass, tenderness, anal fissure or internal hemorrhoid.     Comments: Brown stool in vault. No gross blood. Musculoskeletal:        General: Normal range of motion.     Cervical back: Normal range of motion and neck supple.  Skin:    General: Skin is warm and dry.  Neurological:     Mental Status: He is alert.    ED Results / Procedures / Treatments   Labs (all labs ordered are listed, but only abnormal results are displayed) Labs Reviewed  COMPREHENSIVE METABOLIC PANEL - Abnormal; Notable for the following components:      Result Value   Glucose, Bld 104 (*)    Albumin 3.3 (*)    All other components within normal limits  CBC - Abnormal; Notable for the following components:   RBC 2.76 (*)    Hemoglobin 6.7 (*)    HCT 22.4 (*)    MCH 24.3 (*)    MCHC 29.9 (*)    Platelets 417 (*)    All other components within normal limits  POC OCCULT BLOOD, ED  POC OCCULT BLOOD, ED  TYPE AND SCREEN  ABO/RH  PREPARE RBC (CROSSMATCH)    EKG EKG Interpretation  Date/Time:  Tuesday Sep 14 2019 17:39:18 EDT Ventricular Rate:  64 PR Interval:    QRS Duration: 113 QT Interval:  440 QTC Calculation: 454 R Axis:   78 Text Interpretation: Sinus rhythm Incomplete right bundle branch block No STEMI Confirmed by Octaviano Glow [phone]) on 09/14/2019 5:49:44 PM   Radiology CT Abdomen Pelvis W Contrast  Result Date: 09/14/2019 CLINICAL DATA:  Abnormal labs with nausea and vomiting. EXAM: CT ABDOMEN AND PELVIS WITH CONTRAST TECHNIQUE: Multidetector CT imaging of the abdomen and pelvis was performed using the standard protocol following bolus administration of intravenous contrast. CONTRAST:  129mL OMNIPAQUE IOHEXOL 300 MG/ML  SOLN COMPARISON:  None. FINDINGS: Lower  chest: No acute abnormality. Hepatobiliary: A 5.0 cm x 4.4 cm cyst is seen within the right lobe of the liver, posterior to the gallbladder fossa. No gallstones, gallbladder wall thickening, or biliary dilatation. Pancreas: Unremarkable. No pancreatic ductal dilatation or surrounding inflammatory changes. Spleen: Normal in size without focal abnormality. Adrenals/Urinary Tract: Adrenal glands are unremarkable. Kidneys are normal in size, without renal calculi or hydronephrosis. Multiple simple cysts of various sizes are seen within both kidneys. Bladder is unremarkable. Stomach/Bowel: There is a large hiatal hernia. Appendix appears normal. No evidence of bowel dilatation. Noninflamed  diverticula are seen throughout the large bowel. Vascular/Lymphatic: No significant vascular findings are present. No enlarged abdominal or pelvic lymph nodes. Reproductive: Prostate is unremarkable. Other: There is a 2.1 cm x 1.5 cm fat containing left inguinal hernia. No abdominopelvic ascites. Musculoskeletal: Moderate severity degenerative changes seen within the lower lumbar spine. IMPRESSION: 1. Large hiatal hernia. 2. Hepatic and bilateral simple renal cysts. 3. Colonic diverticulosis. 4. Small fat containing left inguinal hernia. Electronically Signed   By: Virgina Norfolk M.D.   On: 09/14/2019 18:37   DG Chest Portable 1 View  Result Date: 09/14/2019 CLINICAL DATA:  Low hemoglobin and shortness of breath. EXAM: PORTABLE CHEST 1 VIEW COMPARISON:  None. FINDINGS: There is no evidence of acute infiltrate, pleural effusion or pneumothorax. The heart size and mediastinal contours are within normal limits. Degenerative changes seen within the mid to lower thoracic spine. IMPRESSION: No active disease. Electronically Signed   By: Virgina Norfolk M.D.   On: 09/14/2019 18:04    Procedures .Critical Care Performed by: Nettie Elm, PA-C Authorized by: Nettie Elm, PA-C   Critical care provider statement:     Critical care time (minutes):  45   Critical care was necessary to treat or prevent imminent or life-threatening deterioration of the following conditions: Anemia requiring transfusion.   Critical care was time spent personally by me on the following activities:  Discussions with consultants, evaluation of patient's response to treatment, examination of patient, ordering and performing treatments and interventions, ordering and review of laboratory studies, ordering and review of radiographic studies, pulse oximetry, re-evaluation of patient's condition, obtaining history from patient or surrogate and review of old charts   (including critical care time)  Medications Ordered in ED Medications  0.9 %  sodium chloride infusion (0 mL/hr Intravenous Stopped 09/14/19 2126)  iohexol (OMNIPAQUE) 300 MG/ML solution 100 mL (100 mLs Intravenous Contrast Given 09/14/19 1808)    ED Course  I have reviewed the triage vital signs and the nursing notes.  Pertinent labs & imaging results that were available during my care of the patient were reviewed by me and considered in my medical decision making (see chart for details).  64 year old male presents for evaluation of abnormal lab. Noted to be anemic at PCP office 1 month ago with repeat labs today was worsening anemia. Admits to dark stools however states this is been constant since he was started on iron. Has had a 40 pound weight loss over the last few months. Admits to decreased appetite which he thinks is likely due to reflux. Denies any pain, lightheadedness, dizziness. No gross blood on exam and occult negative. Due to weight changes will obtain CT abdomen pelvis. Patient is amenable to transfusion.  Labs and imaging personally reviewed and interpreted Occult negative, likely dark stools secondary to iron supplements CBC without leukocytosis, hemoglobin 6.7 Metabolic panel without electrolyte, renal abnormality DG chest without acute infiltrates,  cardiomegaly, pleural edema, pneumothorax CT abdomen pelvis without acute findings. EKG with incomplete right bundle branch.  Denies chest pain, shortness of breath.  Patient reassessed.  Currently getting transfusion.  Will attempt to consult [facility] as he has equal PCP to discuss disposition.  Consult with Dr. Alessandra Bevels with [facility].  States since patient is asymptomatic he may follow-up closely in office tomorrow or the next day.  Does not feel patient needs to be admitted at this time which I feel is reasonable.  Reassessed patient.  He continues to deny any symptoms.  Tolerating p.o. intake.  Discussed plan with close follow-up with [facility].  Patient amenable to plan.  Currently finishing up on transfusion.  Plan discharge once complete.  Patient reassessed. Transfusion complete.  Ambulatory that difficulty.  Continues to 90 lightheadedness or dizziness.  Stressed close follow-up with  The patient has been appropriately medically screened and/or stabilized in the ED. I have low suspicion for any other emergent medical condition which would require further screening, evaluation or treatment in the ED or require inpatient management.  Patient is hemodynamically stable and in no acute distress.  Patient able to ambulate in department prior to ED.  Evaluation does not show acute pathology that would require ongoing or additional emergent interventions while in the emergency department or further inpatient treatment.  I have discussed the diagnosis with the patient and answered all questions.  Pain is been managed while in the emergency department and patient has no further complaints prior to discharge.  Patient is comfortable with plan discussed in room and is stable for discharge at this time.  I have discussed strict return precautions for returning to the emergency department.  Patient was encouraged to follow-up with PCP/specialist refer to at discharge.  Patient seen eval by attending, Dr.  Langston Masker agrees with the treatment, plan and disposition  Clinical Course as of Sep 13 2225  Tue Sep 13, 7240  6021 64 year old male with a history of reflux and recently diagnosed iron deficiency anemia, on iron, presenting to the ED with concern for anemia.  Patient was seen by GI doctor today for an initial evaluation and told that his hemoglobin level was low and should come to the ED.  His hemoglobin here is 6.7.  Patient reports that he had blood test done approximately a month ago by his primary care doctor and was told his hemoglobin is low at that time, and started on iron pills, she has been compliant with.  The patient denies any chest pain, shortness of breath, lightheadedness.  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  He has had a distant history of anemia as a childhood but this apparently resolved.  He otherwise reports no medical problems.  He has never had a colonoscopy.  He has a family history of oropharyngeal cancer in his brother who was a heavy smoker.  On exam the patient is extremely well-appearing.  His vitals are within normal limits.  He is not tachycardic.  His Hemoccult was negative.  We have consented him verbally for transfusion 1 unit of blood given his hemoglobin less than 7 here.  He is also getting a CT scan to evaluate for possible malignancy.  Clinically he is well appearing, will reassess   [MT]  1849 IMPRESSION: 1. Large hiatal hernia. 2. Hepatic and bilateral simple renal cysts. 3. Colonic diverticulosis. 4. Small fat containing left inguinal hernia.   [MT]  2013 Spoke to GI who will be seeing him tomorrow in clinic.  Given that he is completely asymptomatic, I suspect his anemia is likely chronic.  His BUN is also not elevated to suggest acute upper GI bleed.  We can discharge him after his transfusion   [MT]    Clinical Course User Index [MT] Trifan, Carola Rhine, MD   MDM Rules/Calculators/A&P                        Final Clinical Impression(s) / ED Diagnoses Final diagnoses:  Anemia, unspecified type  Rx / DC Orders ED Discharge Orders    None       Jamile Rekowski A, PA-C 09/14/19 2227    Wyvonnia Dusky, MD 09/15/19 [phone]

## 2019-09-14 NOTE — ED Notes (Signed)
Pt back from CT

## 2019-09-14 NOTE — Discharge Instructions (Signed)
Follow up with GI tomorrow.  If you develop any lightheadedness, dizziness, passing out episodes or severe abdominal pain please seek reevaluation

## 2019-09-15 LAB — TYPE AND SCREEN
ABO/RH(D): B NEG
Antibody Screen: NEGATIVE
Unit division: 0

## 2019-09-15 LAB — BPAM RBC
Blood Product Expiration Date: 202105252359
ISSUE DATE / TIME: 202105111848

## 2019-09-17 ENCOUNTER — Encounter (HOSPITAL_COMMUNITY): Payer: Self-pay

## 2019-09-17 ENCOUNTER — Other Ambulatory Visit: Payer: Self-pay

## 2019-09-17 ENCOUNTER — Observation Stay (HOSPITAL_COMMUNITY)
Admission: EM | Admit: 2019-09-17 | Discharge: 2019-09-19 | Disposition: A | Discharge status: Home / Self Care | Payer: 59 | Attending: Internal Medicine | Admitting: Internal Medicine

## 2019-09-17 DIAGNOSIS — Z791 Long term (current) use of non-steroidal anti-inflammatories (NSAID): Secondary | ICD-10-CM | POA: Diagnosis not present

## 2019-09-17 DIAGNOSIS — D649 Anemia, unspecified: Secondary | ICD-10-CM | POA: Diagnosis present

## 2019-09-17 DIAGNOSIS — Z20822 Contact with and (suspected) exposure to covid-19: Secondary | ICD-10-CM | POA: Insufficient documentation

## 2019-09-17 DIAGNOSIS — E876 Hypokalemia: Secondary | ICD-10-CM | POA: Diagnosis not present

## 2019-09-17 DIAGNOSIS — R55 Syncope and collapse: Secondary | ICD-10-CM

## 2019-09-17 DIAGNOSIS — D509 Iron deficiency anemia, unspecified: Secondary | ICD-10-CM | POA: Diagnosis not present

## 2019-09-17 DIAGNOSIS — K219 Gastro-esophageal reflux disease without esophagitis: Secondary | ICD-10-CM | POA: Insufficient documentation

## 2019-09-17 DIAGNOSIS — D630 Anemia in neoplastic disease: Secondary | ICD-10-CM | POA: Diagnosis present

## 2019-09-17 DIAGNOSIS — W07XXXA Fall from chair, initial encounter: Secondary | ICD-10-CM | POA: Insufficient documentation

## 2019-09-17 HISTORY — DX: Anemia, unspecified: D64.9

## 2019-09-17 LAB — URINALYSIS, ROUTINE W REFLEX MICROSCOPIC
Bilirubin Urine: NEGATIVE
Glucose, UA: NEGATIVE mg/dL
Hgb urine dipstick: NEGATIVE
Ketones, ur: NEGATIVE mg/dL
Leukocytes,Ua: NEGATIVE
Nitrite: NEGATIVE
Protein, ur: NEGATIVE mg/dL
Specific Gravity, Urine: 1.024 (ref 1.005–1.030)
pH: 5 (ref 5.0–8.0)

## 2019-09-17 LAB — CBC
HCT: 25 % — ABNORMAL LOW (ref 39.0–52.0)
Hemoglobin: 7.4 g/dL — ABNORMAL LOW (ref 13.0–17.0)
MCH: 24 pg — ABNORMAL LOW (ref 26.0–34.0)
MCHC: 29.6 g/dL — ABNORMAL LOW (ref 30.0–36.0)
MCV: 81.2 fL (ref 80.0–100.0)
Platelets: 437 10*3/uL — ABNORMAL HIGH (ref 150–400)
RBC: 3.08 MIL/uL — ABNORMAL LOW (ref 4.22–5.81)
RDW: 15.9 % — ABNORMAL HIGH (ref 11.5–15.5)
WBC: 10.7 10*3/uL — ABNORMAL HIGH (ref 4.0–10.5)
nRBC: 0 % (ref 0.0–0.2)

## 2019-09-17 LAB — BASIC METABOLIC PANEL
Anion gap: 13 (ref 5–15)
BUN: 10 mg/dL (ref 8–23)
CO2: 24 mmol/L (ref 22–32)
Calcium: 8.7 mg/dL — ABNORMAL LOW (ref 8.9–10.3)
Chloride: 102 mmol/L (ref 98–111)
Creatinine, Ser: 0.77 mg/dL (ref 0.61–1.24)
GFR calc Af Amer: >60 mL/min (ref >60)
GFR calc non Af Amer: >60 mL/min (ref >60)
Glucose, Bld: 130 mg/dL — ABNORMAL HIGH (ref 70–99)
Potassium: 3.4 mmol/L — ABNORMAL LOW (ref 3.5–5.1)
Sodium: 139 mmol/L (ref 135–145)

## 2019-09-17 MED ORDER — SODIUM CHLORIDE 0.9% FLUSH
3.0000 mL | Freq: Once | INTRAVENOUS | Status: AC
  Administered 2019-09-18: 3 mL via INTRAVENOUS

## 2019-09-17 NOTE — ED Triage Notes (Addendum)
Pt arrives to ED w/ c/o syncopal episode last night. Pt had LOC episode of approx 1 minute. Pt was sitting down, did not fall or hit head. Pt states that he had a blood transfusion last Tuesday for hgb 7.1, pt has hx of anemia.

## 2019-09-18 ENCOUNTER — Encounter (HOSPITAL_COMMUNITY): Payer: Self-pay | Admitting: Internal Medicine

## 2019-09-18 ENCOUNTER — Other Ambulatory Visit: Payer: Self-pay

## 2019-09-18 DIAGNOSIS — D630 Anemia in neoplastic disease: Secondary | ICD-10-CM | POA: Diagnosis present

## 2019-09-18 LAB — COMPREHENSIVE METABOLIC PANEL
ALT: 15 U/L (ref 0–44)
AST: 17 U/L (ref 15–41)
Albumin: 3.4 g/dL — ABNORMAL LOW (ref 3.5–5.0)
Alkaline Phosphatase: 60 U/L (ref 38–126)
Anion gap: 9 (ref 5–15)
BUN: 9 mg/dL (ref 8–23)
CO2: 25 mmol/L (ref 22–32)
Calcium: 9 mg/dL (ref 8.9–10.3)
Chloride: 105 mmol/L (ref 98–111)
Creatinine, Ser: 0.73 mg/dL (ref 0.61–1.24)
GFR calc Af Amer: >60 mL/min (ref >60)
GFR calc non Af Amer: >60 mL/min (ref >60)
Glucose, Bld: 105 mg/dL — ABNORMAL HIGH (ref 70–99)
Potassium: 3.3 mmol/L — ABNORMAL LOW (ref 3.5–5.1)
Sodium: 139 mmol/L (ref 135–145)
Total Bilirubin: 0.6 mg/dL (ref 0.3–1.2)
Total Protein: 6.9 g/dL (ref 6.5–8.1)

## 2019-09-18 LAB — CBC
HCT: 24.5 % — ABNORMAL LOW (ref 39.0–52.0)
HCT: 26.2 % — ABNORMAL LOW (ref 39.0–52.0)
Hemoglobin: 7.2 g/dL — ABNORMAL LOW (ref 13.0–17.0)
Hemoglobin: 8 g/dL — ABNORMAL LOW (ref 13.0–17.0)
MCH: 23.5 pg — ABNORMAL LOW (ref 26.0–34.0)
MCH: 24.4 pg — ABNORMAL LOW (ref 26.0–34.0)
MCHC: 29.4 g/dL — ABNORMAL LOW (ref 30.0–36.0)
MCHC: 30.5 g/dL (ref 30.0–36.0)
MCV: 80.1 fL (ref 80.0–100.0)
MCV: 79.9 fL — ABNORMAL LOW (ref 80.0–100.0)
Platelets: 386 10*3/uL (ref 150–400)
Platelets: 361 10*3/uL (ref 150–400)
RBC: 3.06 MIL/uL — ABNORMAL LOW (ref 4.22–5.81)
RBC: 3.28 MIL/uL — ABNORMAL LOW (ref 4.22–5.81)
RDW: 15.7 % — ABNORMAL HIGH (ref 11.5–15.5)
RDW: 15.6 % — ABNORMAL HIGH (ref 11.5–15.5)
WBC: 8.5 10*3/uL (ref 4.0–10.5)
WBC: 9.3 10*3/uL (ref 4.0–10.5)
nRBC: 0 % (ref 0.0–0.2)
nRBC: 0 % (ref 0.0–0.2)

## 2019-09-18 LAB — LIPASE, BLOOD: Lipase: 35 U/L (ref 11–51)

## 2019-09-18 LAB — SARS CORONAVIRUS 2 BY RT PCR (HOSPITAL ORDER, PERFORMED IN ~~LOC~~ HOSPITAL LAB): SARS Coronavirus 2: NEGATIVE

## 2019-09-18 LAB — IRON AND TIBC
Iron: 12 ug/dL — ABNORMAL LOW (ref 45–182)
Saturation Ratios: 3 % — ABNORMAL LOW (ref 17.9–39.5)
TIBC: 351 ug/dL (ref 250–450)
UIBC: 339 ug/dL

## 2019-09-18 LAB — FERRITIN: Ferritin: 4 ng/mL — ABNORMAL LOW (ref 24–336)

## 2019-09-18 LAB — VITAMIN B12: Vitamin B-12: 213 pg/mL (ref 180–914)

## 2019-09-18 LAB — PREPARE RBC (CROSSMATCH)

## 2019-09-18 LAB — MAGNESIUM: Magnesium: 1.9 mg/dL (ref 1.7–2.4)

## 2019-09-18 LAB — PROTIME-INR
INR: 1.2 (ref 0.8–1.2)
Prothrombin Time: 14.4 seconds (ref 11.4–15.2)

## 2019-09-18 LAB — RETICULOCYTES
Immature Retic Fract: 24.8 % — ABNORMAL HIGH (ref 2.3–15.9)
RBC.: 3.09 MIL/uL — ABNORMAL LOW (ref 4.22–5.81)
Retic Count, Absolute: 47.6 10*3/uL (ref 19.0–186.0)
Retic Ct Pct: 1.5 % (ref 0.4–3.1)

## 2019-09-18 LAB — FOLATE: Folate: 7.8 ng/mL (ref >5.9)

## 2019-09-18 LAB — TROPONIN I (HIGH SENSITIVITY)
Troponin I (High Sensitivity): 4 ng/L (ref <18)
Troponin I (High Sensitivity): 4 ng/L (ref <18)

## 2019-09-18 LAB — LACTATE DEHYDROGENASE: LDH: 116 U/L (ref 98–192)

## 2019-09-18 LAB — HIV ANTIBODY (ROUTINE TESTING W REFLEX): HIV Screen 4th Generation wRfx: NONREACTIVE

## 2019-09-18 MED ORDER — POTASSIUM CHLORIDE CRYS ER 20 MEQ PO TBCR
20.0000 meq | EXTENDED_RELEASE_TABLET | Freq: Two times a day (BID) | ORAL | Status: AC
  Administered 2019-09-18 – 2019-09-19 (×2): 20 meq via ORAL
  Filled 2019-09-18 (×2): qty 1

## 2019-09-18 MED ORDER — SODIUM CHLORIDE 0.9 % IV SOLN
510.0000 mg | Freq: Once | INTRAVENOUS | Status: AC
  Administered 2019-09-18: 510 mg via INTRAVENOUS
  Filled 2019-09-18 (×2): qty 17

## 2019-09-18 MED ORDER — SODIUM CHLORIDE 0.9 % IV BOLUS
1000.0000 mL | Freq: Once | INTRAVENOUS | Status: AC
  Administered 2019-09-18: 1000 mL via INTRAVENOUS

## 2019-09-18 MED ORDER — PANTOPRAZOLE SODIUM 40 MG IV SOLR
40.0000 mg | Freq: Two times a day (BID) | INTRAVENOUS | Status: DC
  Administered 2019-09-18 – 2019-09-19 (×3): 40 mg via INTRAVENOUS
  Filled 2019-09-18 (×3): qty 40

## 2019-09-18 MED ORDER — SODIUM CHLORIDE 0.9% IV SOLUTION: Freq: Once | INTRAVENOUS | Status: AC

## 2019-09-18 NOTE — H&P (Addendum)
Date: 09/18/2019               Patient Name:  Brett Dawson MRN: [id number]  DOB: 02-19-1956 Age / Sex: 64 y.o., male   PCP: Default, Provider, MD         Medical Service: Internal Medicine Teaching Service         Attending Physician: Dr. Sid Falcon, MD    First Contact: Dr. Gilford Rile Pager: [phone]  Second Contact: Dr. Koleen Distance Pager: [phone]       After Hours (After 5p/  First Contact Pager: [phone]  weekends / holidays): Second Contact Pager: [phone]   Chief Complaint: Syncope  History of Present Illness:  Mr. Hartke is a 64 y/o male with a PMH of GERD and anemia, who presents to [facility] after experiencing a syncopal event. Patient states that he walked into a bar this afternoon to eat. While he was sitting at the bar, he states that he felt like "my head was swimming," felt sweaty, and had tunnel vision. He states that he then fainted. After falling out of the chair, he states no witnesses noticed any jerking movements. He denied urinary incontinence, fecal incontinence, or biting his tongue. He denies hitting his head. Patient attributes his symptoms to his anemia, which he was diagnosed with as a child. He additionally states that maybe his blood loss is due to his GERD. He is scheduled to meet with [facility] on Monday at 11:45 am for a telehealth visit. Patient does endorse 20-30 pound unintentional weight loss that he attributes to cutting back on alcohol since the COVID 19 Pandemic began. He states that he has not had a syncopal event before, and has not had an episode since coming to the ED. He denies nausea, vomiting, chest pain, palpitations, abdominal pain.    Patient was recently seen in the ED after getting a blood draw from his PCP, who told him he was anemic and needed to go to the ED. Patient arrived and had hgb of 6.7 and was transfused with a unit of blood and was discharged. Patient was found to have a hgb of 7.4 on admission with a repeat hgb of 7.2     Meds:  Current Meds  Medication Sig  . FEROSUL 325 (65 Fe) MG tablet Take 325 mg by mouth daily.     Allergies: Allergies as of 09/17/2019  . (No Known Allergies)   History reviewed. No pertinent past medical history.  Family History:  DM: None HTN: Mother Cancer: Brother (Throat)  Social History: Housing: Lives at home  Tobacco: Denies ETOH: Previous ETOH user, 2 ETOH per day, has stopped since COVID pandemic.  Drugs: Occasional marijuana use.   Review of Systems: A complete ROS was negative except as per HPI.   Physical Exam: Blood pressure 119/77, pulse (!) 55, temperature (!) 96.5 F (35.8 C), temperature source Tympanic, resp. rate 11, SpO2 100 %. Physical Exam Vitals and nursing note reviewed.  Constitutional:      General: He is not in acute distress.    Appearance: Normal appearance. He is not ill-appearing or toxic-appearing.  HENT:     Head: Normocephalic and atraumatic.  Eyes:     General:        Right eye: No discharge.        Left eye: No discharge.     Conjunctiva/sclera: Conjunctivae normal.  Cardiovascular:     Rate and Rhythm: Regular rhythm. Bradycardia present.  Pulses: Normal pulses.     Heart sounds: Normal heart sounds. No murmur. No friction rub. No gallop.   Pulmonary:     Effort: Pulmonary effort is normal.     Breath sounds: Normal breath sounds. No wheezing, rhonchi or rales.  Abdominal:     General: Bowel sounds are normal.     Palpations: Abdomen is soft.     Tenderness: There is no abdominal tenderness. There is no guarding.  Musculoskeletal:        General: No swelling.     Right lower leg: No edema.     Left lower leg: No edema.  Neurological:     General: No focal deficit present.     Mental Status: He is alert and oriented to person, place, and time.  Psychiatric:        Mood and Affect: Mood normal.        Behavior: Behavior normal.     EKG: personally reviewed my interpretation is NSR with no ST  elevations  Assessment & Plan by Problem: Active Problems:   Symptomatic anemia  [name] is a 64 y/o male with a PMH anemia and GERD, presenting to the ED for syncope and admitted for anemia.   Symptomatic Anemia:  Patient presenting to the ED with symptomatic anemia with syncope. Patient presented to the ED on 5/11 with a Hgb of 6.7 and received a unit of blood and was discharged. He presents today, after a night in the ED, with a Hgb of 7.4->7.2. Patient has a history of anemia since he was a child. Patient endorses a family history of PVD, but denies family history of blood disorders. Patient endorses dark stool, but takes an iron supplement, and is constipated with - FOBT on previous ED visit. Patient has a Hx of GERD, and wonders if this could be due to his reflux. Patient also endorses unintentional weight loss of 20-30 pounds, which may concerning for malignancy. Patient scheduled to meet with [facility] on Monday at 11:45 am. Receiving 1 unit PRBC and 510 [place]. Labs concerning for iron deficiency anemia. - Reached out to [facility]. We discussed waiting for Mr. Nocera's H&H results. If patient's anemia improves, he can be discharged and followed closely by GI in the outpatient setting. If patient is resistant, we will reach out to [facility]. We appreciate their recommendations.  - Follow up H&H - Trend CBC - LDH - Reticulocytes - Ferritin - Iron and TIBC - Folate - Vit b12 - Protonix 40 mg IV - Clear Liquid diet - Orthostatic vital signs - Continuous pulse oximetry - Vital signs  Hypokalemia:  K: 3.3 - KCL 20 mEq BID 2 doses - Trend BMP  Dispo: Admit patient to Observation with expected length of stay less than 2 midnights.  Signed: Maudie Mercury, MD 09/18/2019, [place] PM  Pager: [phone]

## 2019-09-18 NOTE — Progress Notes (Signed)
New Admission Note:  Arrival Method: By stretcher from ED at this time.  Mental Orientation: Alert and oriented x 4 Telemetry: Box 15, CCMD notified Assessment: Completed Skin: Completed, refer to flowsheets IV: R A.C. S.L. Pain: Denies Tubes: None Safety Measures: Safety Fall Prevention Plan was given, discussed. Admission: Completed [facility] [place] Orientation: Patient has been orientated to the room, unit and the staff. Family: None  Orders have been reviewed and implemented. Will continue to monitor the patient. Call light has been placed within reach and bed alarm has been activated. Pt was placed on a low bed due to being admitted for a syncopal episode and low hemoglobin. Pt understands why he needs to be on a low bed.   Perry Mount, RN  Phone Number: [phone]

## 2019-09-18 NOTE — ED Provider Notes (Signed)
[facility] EMERGENCY DEPARTMENT Provider Note   CSN: [id number] Arrival date & time: 09/17/19  1831     History Chief Complaint  Patient presents with  . Loss of Consciousness    Brett Dawson is a 64 y.o. male.  The history is provided by the patient and medical records. No language interpreter was used.  Loss of Consciousness Episode history:  Single Most recent episode:  Yesterday Duration:  1 minute Timing:  Unable to specify Progression:  Unchanged Chronicity:  New Witnessed: yes   Relieved by:  Nothing Worsened by:  Nothing Ineffective treatments:  None tried Associated symptoms: malaise/fatigue and nausea   Associated symptoms: no chest pain, no diaphoresis, no dizziness, no fever, no focal weakness, no headaches, no palpitations, no recent injury, no rectal bleeding, no seizures, no shortness of breath, no vomiting and no weakness        History reviewed. No pertinent past medical history.  There are no problems to display for this patient.   History reviewed. No pertinent surgical history.     Family History  Problem Relation Age of Onset  . Hyperlipidemia Mother     Social History   Tobacco Use  . Smoking status: Never Smoker  . Smokeless tobacco: Never Used  Substance Use Topics  . Alcohol use: Yes    Alcohol/week: 40.0 standard drinks    Types: 40 Standard drinks or equivalent per week  . Drug use: Yes    Types: Marijuana    Home Medications Prior to Admission medications   Medication Sig Start Date End Date Taking? Authorizing Provider  FEROSUL 325 (65 Fe) MG tablet Take 325 mg by mouth daily. 07/27/19   [provider]  ibuprofen (ADVIL,MOTRIN) 200 MG tablet Take 200-400 mg by mouth every 6 (six) hours as needed (back pain).     [provider]  meloxicam (MOBIC) 15 MG tablet Take 1 tablet (15 mg total) by mouth daily. Patient not taking: Reported on 09/14/2019 05/21/17   McVey, Gelene Mink, PA-C    pantoprazole (PROTONIX) 20 MG tablet Take 20 mg by mouth daily. 09/14/19   [provider]  predniSONE (DELTASONE) 20 MG tablet Take 3 PO QAM x3days, 2 PO QAM x3days, 1 PO QAM x3days Patient not taking: Reported on 09/14/2019 05/21/17   McVey, Gelene Mink, PA-C  traMADol (ULTRAM) 50 MG tablet Take 1 tablet (50 mg total) by mouth every 8 (eight) hours as needed. Patient not taking: Reported on 09/14/2019 05/21/17   McVey, Gelene Mink, PA-C    Allergies    Patient has no known allergies.  Review of Systems   Review of Systems  Constitutional: Positive for fatigue and malaise/fatigue. Negative for chills, diaphoresis and fever.  HENT: Negative for congestion.   Eyes: Negative for visual disturbance.  Respiratory: Negative for cough, chest tightness and shortness of breath.   Cardiovascular: Positive for syncope. Negative for chest pain and palpitations.  Gastrointestinal: Positive for nausea. Negative for abdominal pain and vomiting.  Musculoskeletal: Negative for back pain and neck pain.  Skin: Negative for wound.  Neurological: Positive for syncope and light-headedness. Negative for dizziness, focal weakness, seizures, facial asymmetry, speech difficulty, weakness, numbness and headaches.  Psychiatric/Behavioral: Negative for agitation.  All other systems reviewed and are negative.   Physical Exam Updated Vital Signs BP 104/64   Pulse 73   Temp 98 F (36.7 C) (Oral)   Resp 16   SpO2 100%   Physical Exam Vitals and nursing note reviewed.  Constitutional:      General: He is not in acute distress.    Appearance: He is well-developed. He is not ill-appearing, toxic-appearing or diaphoretic.  HENT:     Head: Normocephalic and atraumatic.     Nose: No congestion or rhinorrhea.     Mouth/Throat:     Mouth: Mucous membranes are moist.     Pharynx: No oropharyngeal exudate or posterior oropharyngeal erythema.  Eyes:     Extraocular Movements: Extraocular  movements intact.     Conjunctiva/sclera: Conjunctivae normal.     Pupils: Pupils are equal, round, and reactive to light.  Cardiovascular:     Rate and Rhythm: Normal rate and regular rhythm.     Heart sounds: No murmur.  Pulmonary:     Effort: Pulmonary effort is normal. No respiratory distress.     Breath sounds: Normal breath sounds. No wheezing, rhonchi or rales.  Chest:     Chest wall: No tenderness.  Abdominal:     General: Abdomen is flat.     Palpations: Abdomen is soft.     Tenderness: There is no abdominal tenderness. There is no right CVA tenderness, left CVA tenderness, guarding or rebound.  Musculoskeletal:        General: No tenderness.     Cervical back: Neck supple. No tenderness.     Right lower leg: No edema.     Left lower leg: No edema.  Skin:    General: Skin is warm and dry.     Capillary Refill: Capillary refill takes less than 2 seconds.     Coloration: Skin is pale.     Findings: No erythema or rash.  Neurological:     General: No focal deficit present.     Mental Status: He is alert.  Psychiatric:        Mood and Affect: Mood normal.     ED Results / Procedures / Treatments   Labs (all labs ordered are listed, but only abnormal results are displayed) Labs Reviewed  BASIC METABOLIC PANEL - Abnormal; Notable for the following components:      Result Value   Potassium 3.4 (*)    Glucose, Bld 130 (*)    Calcium 8.7 (*)    All other components within normal limits  CBC - Abnormal; Notable for the following components:   WBC 10.7 (*)    RBC 3.08 (*)    Hemoglobin 7.4 (*)    HCT 25.0 (*)    MCH 24.0 (*)    MCHC 29.6 (*)    RDW 15.9 (*)    Platelets 437 (*)    All other components within normal limits  URINALYSIS, ROUTINE W REFLEX MICROSCOPIC - Abnormal; Notable for the following components:   APPearance HAZY (*)    All other components within normal limits  CBC - Abnormal; Notable for the following components:   RBC 3.06 (*)    Hemoglobin  7.2 (*)    HCT 24.5 (*)    MCH 23.5 (*)    MCHC 29.4 (*)    RDW 15.7 (*)    All other components within normal limits  COMPREHENSIVE METABOLIC PANEL - Abnormal; Notable for the following components:   Potassium 3.3 (*)    Glucose, Bld 105 (*)    Albumin 3.4 (*)    All other components within normal limits  IRON AND TIBC - Abnormal; Notable for the following components:   Iron 12 (*)    Saturation Ratios 3 (*)  All other components within normal limits  FERRITIN - Abnormal; Notable for the following components:   Ferritin 4 (*)    All other components within normal limits  RETICULOCYTES - Abnormal; Notable for the following components:   RBC. 3.09 (*)    Immature Retic Fract 24.8 (*)    All other components within normal limits  SARS CORONAVIRUS 2 BY RT PCR (HOSPITAL ORDER, [facility])  MAGNESIUM  PROTIME-INR  LIPASE, BLOOD  VITAMIN B12  FOLATE  LACTATE DEHYDROGENASE  HIV ANTIBODY (ROUTINE TESTING W REFLEX)  CBC  PATHOLOGIST SMEAR REVIEW  CBG MONITORING, ED  TYPE AND SCREEN  PREPARE RBC (CROSSMATCH)  TROPONIN I (HIGH SENSITIVITY)  TROPONIN I (HIGH SENSITIVITY)    EKG EKG Interpretation  Date/Time:  Friday Sep 17 2019 18:37:52 EDT Ventricular Rate:  68 PR Interval:  200 QRS Duration: 102 QT Interval:  412 QTC Calculation: 438 R Axis:   67 Text Interpretation: Normal sinus rhythm Normal ECG When compared to prior, no significant changes seen. No sTEMI Confirmed by Antony Blackbird [phone]) on 09/18/2019 7:53:50 AM   Radiology No results found.  Procedures Procedures (including critical care time)  Medications Ordered in ED Medications  sodium chloride flush (NS) 0.9 % injection 3 mL (has no administration in time range)  pantoprazole (PROTONIX) injection 40 mg (40 mg Intravenous Given 09/18/19 1453)  sodium chloride 0.9 % bolus 1,000 mL (0 mLs Intravenous Stopped 09/18/19 1533)  0.9 %  sodium chloride infusion (Manually program via  Guardrails IV Fluids) ( Intravenous Stopped 09/18/19 1554)    ED Course  I have reviewed the triage vital signs and the nursing notes.  Pertinent labs & imaging results that were available during my care of the patient were reviewed by me and considered in my medical decision making (see chart for details).    MDM Rules/Calculators/A&P                      Brett Dawson is a 64 y.o. male with a past medical history significant for anemia of unknown etiology who presents with syncope.  Patient reports that he went to see his PCP earlier this week as he was having some nausea and vomiting episodes and thought it was reflux.  When he had blood drawn at PCP, he was told he had anemia and need to go to the emergency department.  Patient was found to have anemia of 6.7 here on Tuesday and was given a unit of blood before he was discharged as he was feeling well.  Patient says that last night, while sitting in a restaurant, he syncopized around 1 minute without trauma.  He reports everything got dark and he passed out.  He denied any preceding chest pain, palpitations, or shortness of breath.  He presented approximately 13 hours ago to the emergency department where he initially had blood work collected revealing a hemoglobin of 7.4.  I just saw the patient after approximately 13 hours and 15 minutes of his ED stay and aside from feeling tired, he denies any further syncopal episodes.  He denies any chest pain or palpitations at this time.  He says that he has had dark stools since he was started on iron pills but he reports that his fecal test was negative last time he was here.  He has no history of GI bleeds to his knowledge.  He reports that years ago he syncopized in the setting of anemia.  On  exam, lungs are clear and chest is nontender.  Abdomen is nontender.  Patient has no focal neurologic deficits and has good pulses in all extremities.  Patient does appear pale with pale mucous membranes.   Otherwise, exam was overall well-appearing.  Blood pressure was around [id number] systolic when I first evaluated him.  Clinically I am concerned about symptomatic anemia leading to a syncopal episode as it did years ago.  Patient had blood drawn over 12 hours ago thus, we will get a repeat blood draw right now to see what his hemoglobin is trending towards.  If he is downtrending, anticipate he will need blood transfusion at admission for symptomatic anemia.  If it is stable or increasing, will have a shared decision-making conversation about game plan as this is the second visit in the last week for significant anemia.  Anticipate reassessment after work-up.  Patient is in no distress on initial evaluation.  Repeat hemoglobin is downtrending to 7.2.  Given the patient's syncopal episode with downtrending hemoglobin, I feel he needs admission for determination of the etiology of his anemia as well as for treatment for his syncopal episode.  Patient agrees with plan of care and will be admitted.  Will defer to admitting team as to when to transfuse.  Patient agrees with this plan and was admitted in stable condition.   Final Clinical Impression(s) / ED Diagnoses Final diagnoses:  Syncope, unspecified syncope type  Symptomatic anemia     Clinical Impression: 1. Syncope, unspecified syncope type   2. Symptomatic anemia     Disposition: Admit  This note was prepared with assistance of Dragon voice recognition software. Occasional wrong-word or sound-a-like substitutions may have occurred due to the inherent limitations of voice recognition software.      Zakyra Kukuk, Gwenyth Allegra, MD 09/18/19 [phone]

## 2019-09-19 DIAGNOSIS — D649 Anemia, unspecified: Secondary | ICD-10-CM

## 2019-09-19 DIAGNOSIS — R55 Syncope and collapse: Secondary | ICD-10-CM

## 2019-09-19 LAB — BPAM RBC
Blood Product Expiration Date: 202105252359
ISSUE DATE / TIME: 202105151515
Unit Type and Rh: 1700

## 2019-09-19 LAB — TYPE AND SCREEN
ABO/RH(D): B NEG
Antibody Screen: NEGATIVE
Unit division: 0

## 2019-09-19 LAB — CBC
HCT: 25.6 % — ABNORMAL LOW (ref 39.0–52.0)
Hemoglobin: 7.7 g/dL — ABNORMAL LOW (ref 13.0–17.0)
MCH: 24.1 pg — ABNORMAL LOW (ref 26.0–34.0)
MCHC: 30.1 g/dL (ref 30.0–36.0)
MCV: 80 fL (ref 80.0–100.0)
Platelets: 377 10*3/uL (ref 150–400)
RBC: 3.2 MIL/uL — ABNORMAL LOW (ref 4.22–5.81)
RDW: 15.6 % — ABNORMAL HIGH (ref 11.5–15.5)
WBC: 9.4 10*3/uL (ref 4.0–10.5)
nRBC: 0 % (ref 0.0–0.2)

## 2019-09-19 LAB — BASIC METABOLIC PANEL
Anion gap: 8 (ref 5–15)
BUN: 6 mg/dL — ABNORMAL LOW (ref 8–23)
CO2: 24 mmol/L (ref 22–32)
Calcium: 8.7 mg/dL — ABNORMAL LOW (ref 8.9–10.3)
Chloride: 107 mmol/L (ref 98–111)
Creatinine, Ser: 0.78 mg/dL (ref 0.61–1.24)
GFR calc Af Amer: >60 mL/min (ref >60)
GFR calc non Af Amer: >60 mL/min (ref >60)
Glucose, Bld: 98 mg/dL (ref 70–99)
Potassium: 3.8 mmol/L (ref 3.5–5.1)
Sodium: 139 mmol/L (ref 135–145)

## 2019-09-19 LAB — HEMOGLOBIN AND HEMATOCRIT, BLOOD
HCT: 25.9 % — ABNORMAL LOW (ref 39.0–52.0)
Hemoglobin: 7.7 g/dL — ABNORMAL LOW (ref 13.0–17.0)

## 2019-09-19 NOTE — Progress Notes (Addendum)
   Subjective:  O/N Events: None Mr. Seidman feeling well this morning. No further light headedness or vision changes. Walking around the room without dizziness. Denies chest pain, shortness of breath. Had a regular BM this morning. Tolerating liquid diet. Discussed plan for discharge today with close GI follow-up.   Objective:  Vital signs in last 24 hours: Vitals:   09/18/19 1928 09/18/19 1945 09/18/19 2030 09/19/19 0451  BP: 125/64 109/67 110/77 119/63  Pulse: 79 67 70 66  Resp: 13 12 17 18   Temp:  98.4 F (36.9 C) 99 F (37.2 C) 98.6 F (37 C)  TempSrc:   Oral Oral  SpO2: 98% 99% 100% 98%   Physical Exam Constitutional:      General: He is not in acute distress.    Appearance: Normal appearance. He is not ill-appearing, toxic-appearing or diaphoretic.  HENT:     Head: Normocephalic and atraumatic.  Cardiovascular:     Rate and Rhythm: Normal rate and regular rhythm.     Pulses: Normal pulses.     Heart sounds: Normal heart sounds. No murmur. No friction rub. No gallop.   Pulmonary:     Effort: Pulmonary effort is normal.     Breath sounds: Normal breath sounds. No wheezing, rhonchi or rales.  Musculoskeletal:     Right lower leg: No edema.     Left lower leg: No edema.  Neurological:     Mental Status: He is alert and oriented to person, place, and time.  Psychiatric:        Mood and Affect: Mood normal.        Behavior: Behavior normal.     Assessment/Plan:  Active Problems:   Symptomatic anemia  Symptomatic Anemia:  Iron deficiency anemia MCV: 79.9. Receiving 1 unit PRBC and 510 Faraheme. Labs concerning for iron deficiency anemia. Will order H&H for 11:00 if stable, will discharge today - CBC: 7.7 - LDH 116 - Reticulocytes: immature retic fraction 24.8 - Ferritin 4 - Iron and TIBC 12, 3 - Folate 7.8  - Vit b12 213 - Protonix 40 mg IV - Clear Liquid diet - Orthostatic vital signs - Continuous pulse oximetry - Vital signs  Hypokalemia: RESOLVED K:  3.8   Prior to Admission Living Arrangement: Home Anticipated Discharge Location: Home Barriers to Discharge: Home  Dispo: Anticipated discharge in approximately 1 day(s).   Maudie Mercury, MD 09/19/2019, 6:38 AM Pager: [phone]

## 2019-09-19 NOTE — Discharge Instructions (Signed)
To Mr. Criado,  It was a pleasure taking care of you during your hospital stay. You were hospitalized for your low hemoglobin and syncope. You were given a unit of blood and an iron injection. Please continue taking your iron supplement. Please follow up with [facility] appointment tomorrow, and tell them about your brief stay here at [facility]. Please come back to be reevaluated if you have another fainting episode, shortness of breath, or light headed.    Anemia  Anemia is a condition in which you do not have enough red blood cells or hemoglobin. Hemoglobin is a substance in red blood cells that carries oxygen. When you do not have enough red blood cells or hemoglobin (are anemic), your body cannot get enough oxygen and your organs may not work properly. As a result, you may feel very tired or have other problems. What are the causes? Common causes of anemia include:  Excessive bleeding. Anemia can be caused by excessive bleeding inside or outside the body, including bleeding from the intestine or from periods in women.  Poor nutrition.  Long-lasting (chronic) kidney, thyroid, and liver disease.  Bone marrow disorders.  Cancer and treatments for cancer.  HIV (human immunodeficiency virus) and AIDS (acquired immunodeficiency syndrome).  Treatments for HIV and AIDS.  Spleen problems.  Blood disorders.  Infections, medicines, and autoimmune disorders that destroy red blood cells. What are the signs or symptoms? Symptoms of this condition include:  Minor weakness.  Dizziness.  Headache.  Feeling heartbeats that are irregular or faster than normal (palpitations).  Shortness of breath, especially with exercise.  Paleness.  Cold sensitivity.  Indigestion.  Nausea.  Difficulty sleeping.  Difficulty concentrating. Symptoms may occur suddenly or develop slowly. If your anemia is mild, you may not have symptoms. How is this diagnosed? This condition is diagnosed based  on:  Blood tests.  Your medical history.  A physical exam.  Bone marrow biopsy. Your health care provider may also check your stool (feces) for blood and may do additional testing to look for the cause of your bleeding. You may also have other tests, including:  Imaging tests, such as a CT scan or MRI.  Endoscopy.  Colonoscopy. How is this treated? Treatment for this condition depends on the cause. If you continue to lose a lot of blood, you may need to be treated at a hospital. Treatment may include:  Taking supplements of iron, vitamin Z61, or folic acid.  Taking a hormone medicine (erythropoietin) that can help to stimulate red blood cell growth.  Having a blood transfusion. This may be needed if you lose a lot of blood.  Making changes to your diet.  Having surgery to remove your spleen. Follow these instructions at home:  Take over-the-counter and prescription medicines only as told by your health care provider.  Take supplements only as told by your health care provider.  Follow any diet instructions that you were given.  Keep all follow-up visits as told by your health care provider. This is important. Contact a health care provider if:  You develop new bleeding anywhere in the body. Get help right away if:  You are very weak.  You are short of breath.  You have pain in your abdomen or chest.  You are dizzy or feel faint.  You have trouble concentrating.  You have bloody or black, tarry stools.  You vomit repeatedly or you vomit up blood. Summary  Anemia is a condition in which you do not have enough  red blood cells or enough of a substance in your red blood cells that carries oxygen (hemoglobin).  Symptoms may occur suddenly or develop slowly.  If your anemia is mild, you may not have symptoms.  This condition is diagnosed with blood tests as well as a medical history and physical exam. Other tests may be needed.  Treatment for this condition  depends on the cause of the anemia. This information is not intended to replace advice given to you by your health care provider. Make sure you discuss any questions you have with your health care provider. Document Revised: 04/04/2017 Document Reviewed: 05/24/2016 Elsevier Patient Education  [place].   Near-Syncope Near-syncope is when you suddenly feel like you might pass out (faint), but you do not actually lose consciousness. This may also be referred to as presyncope. During an episode of near-syncope, you may:  Feel dizzy, weak, or light-headed.  Feel nauseous.  See all white or all black in your field of vision, or see spots.  Have cold, clammy skin. This condition is caused by a sudden decrease in blood flow to the brain. This decrease can result from various causes, but most of those causes are not dangerous. However, near-syncope may be a sign of a serious medical problem, so it is important to seek medical care. Follow these instructions at home: Medicines  Take over-the-counter and prescription medicines only as told by your health care provider.  If you are taking blood pressure or heart medicine, get up slowly and take several minutes to sit and then stand. This can reduce dizziness. General instructions  Pay attention to any changes in your symptoms.  Talk with your health care provider about your symptoms. You may need to have testing to understand the cause of your near-syncope.  If you start to feel like you might faint, lie down right away and raise (elevate) your feet above the level of your heart. Breathe deeply and steadily. Wait until all of the symptoms have passed.  Have someone stay with you until you feel stable.  Do not drive, use machinery, or play sports until your health care provider says it is okay.  Drink enough fluid to keep your urine pale yellow.  Keep all follow-up visits as told by your health care provider. This is  important. Get help right away if you:  Have a seizure.  Have unusual pain in your chest, abdomen, or back.  Faint once or repeatedly.  Have a severe headache.  Are bleeding from your mouth or rectum, or you have black or tarry stool.  Have a very fast or irregular heartbeat (palpitations).  Are confused.  Have trouble walking.  Have severe weakness.  Have vision problems. These symptoms may represent a serious problem that is an emergency. Do not wait to see if your symptoms will go away. Get medical help right away. Call your local emergency services (911 in the [place] Do not drive yourself to the hospital. Summary  Near-syncope is when you suddenly feel like you might pass out (faint), but you do not actually lose consciousness.  This condition is caused by a sudden decrease in blood flow to the brain. This decrease can result from various causes, but most of those causes are not dangerous.  Near-syncope may be a sign of a serious medical problem, so it is important to seek medical care. This information is not intended to replace advice given to you by your health care provider. Make sure you discuss  any questions you have with your health care provider. Document Revised: 08/14/2018 Document Reviewed: 03/11/2018 Elsevier Patient Education  [place].

## 2019-09-19 NOTE — Discharge Summary (Signed)
Name: Brett Dawson MRN: [id number] DOB: [birth year] 64 y.o. PCP: Default, Provider, MD  Date of Admission: 09/17/2019  6:38 PM Date of Discharge: 09/19/2019 Attending Physician: Sid Falcon, MD  Discharge Diagnosis: 1. Symptomatic Iron Deficiency Anemia  Discharge Medications: Allergies as of 09/19/2019   No Known Allergies     Medication List    STOP taking these medications   meloxicam 15 MG tablet Commonly known as: MOBIC   traMADol 50 MG tablet Commonly known as: ULTRAM     TAKE these medications   FeroSul 325 (65 FE) MG tablet Generic drug: ferrous sulfate Take 325 mg by mouth daily.       Disposition and follow-up:   Brett Dawson was discharged from [facility] in Stable condition.  At the hospital follow up visit please address:  1.  Anemia Workup  2.  Labs / imaging needed at time of follow-up: CBC  3.  Pending labs/ test needing follow-up: None  Follow-up Appointments:   Hospital Course by problem list: 1. Symptomatic Anemia:  Patient presenting to the emergency department with symptomatic anemia asociated with syncope. Patient originally presented to the hemoglobin on 09/14/2019 with a hemoglobin of 6.7 and received a unit of packed red blood cells and was discharged in stable condition. He presented on 09/17/2019,after having a syncopal event with a hemoglobin of 7.4. Patient endorsed following with [facility] on Monday for a consult for an EGD and colonoscopy. [place] [facility] was consulted, and it was recommended that if the hemoglobin was stable in the morning patient should be followed in the outpatient setting. Patient's hemoglobin was 7.7 after observation overnight. Patient was discharged in stable condition with instructions to follow with [facility].   Discharge Vitals:   BP 102/64 (BP Location: Left Arm)   Pulse (!) 54   Temp 98.2 F (36.8 C) (Oral)   Resp 18   SpO2 97%   Pertinent Labs, Studies, and  Procedures:  CBC Latest Ref Rng & Units 09/19/2019 09/19/2019 09/18/2019  WBC 4.0 - 10.5 K/uL - 9.4 9.3  Hemoglobin 13.0 - 17.0 g/dL 7.7(L) 7.7(L) 8.0(L)  Hematocrit 39.0 - 52.0 % 25.9(L) 25.6(L) 26.2(L)  Platelets 150 - 400 K/uL - 377 361   Retic Ct Pct 0.4 - 3.1 % 1.5   RBC. 4.22 - 5.81 MIL/uL 3.09Low    Retic Count, Absolute 19.0 - 186.0 K/uL 47.6   Immature Retic Fract 2.3 - 15.9 % 24.8High      Ref Range & Units 2 d ago  Ferritin 24 - 336 ng/mL 4Low     Ref Range & Units 2 d ago   Iron 45 - 182 ug/dL 12Low    TIBC 250 - 450 ug/dL 351   Saturation Ratios 17.9 - 39.5 % 3Low    UIBC ug/dL 339     Ref Range & Units 2 d ago  Folate >5.9 ng/mL 7.8    Ref Range & Units 2 d ago   Vitamin B-12 180 - 914 pg/mL 213     Discharge Instructions: Discharge Instructions    Call MD for:  extreme fatigue   Complete by: As directed    Call MD for:  persistant dizziness or light-headedness   Complete by: As directed    Call MD for:  persistant nausea and vomiting   Complete by: As directed    Increase activity slowly   Complete by: As directed       Signed: Maudie Mercury, MD 09/19/2019,  11:58 AM   Pager: [phone]

## 2019-09-21 LAB — PATHOLOGIST SMEAR REVIEW

## 2019-11-10 ENCOUNTER — Encounter (HOSPITAL_COMMUNITY): Payer: Self-pay

## 2019-11-10 ENCOUNTER — Other Ambulatory Visit: Payer: Self-pay | Admitting: Gastroenterology

## 2019-11-10 ENCOUNTER — Ambulatory Visit (HOSPITAL_COMMUNITY)
Admission: RE | Admit: 2019-11-10 | Discharge: 2019-11-10 | Disposition: A | Discharge status: Home / Self Care | Payer: 59 | Source: Ambulatory Visit | Attending: Gastroenterology | Admitting: Gastroenterology

## 2019-11-10 ENCOUNTER — Other Ambulatory Visit (HOSPITAL_COMMUNITY): Payer: Self-pay | Admitting: Gastroenterology

## 2019-11-10 DIAGNOSIS — K2289 Other specified disease of esophagus: Secondary | ICD-10-CM

## 2019-11-10 DIAGNOSIS — K228 Other specified diseases of esophagus: Secondary | ICD-10-CM | POA: Insufficient documentation

## 2019-11-10 LAB — POCT I-STAT CREATININE: Creatinine, Ser: 0.8 mg/dL (ref 0.61–1.24)

## 2019-11-10 MED ORDER — IOHEXOL 300 MG/ML  SOLN
75.0000 mL | Freq: Once | INTRAMUSCULAR | Status: AC | PRN
  Administered 2019-11-10: 75 mL via INTRAVENOUS

## 2019-11-10 MED ORDER — SODIUM CHLORIDE (PF) 0.9 % IJ SOLN
INTRAMUSCULAR | Status: AC
  Filled 2019-11-10: qty 50

## 2019-11-11 NOTE — Progress Notes (Signed)
[place] [phone]   [phone]  CONSULT NOTE  REFERRING PHYSICIAN: Dr. Alessandra Bevels MD  REASON FOR CONSULTATION:  Esophageal Mass  HPI Brett Dawson is a 64 y.o. male with a past medical history significant for reflux, gout, alcohol use, iron deficiency anemia, and obesity is referred to the clinic for evaluation of an esophageal mass.  The patient's work-up began after he saw his primary care provider in early May 2021 for worsening symptoms of what he thought was reflux.  He had routine blood work performed while in the clinic that  which noted a hemoglobin at 6.7.  He was subsequently sent to the emergency room.  The patient was transfused with 1 unit of blood.  He also had a CT scan of the abdomen and pelvis performed while in the emergency room which is unremarkable except for a large hiatal hernia.  The patient was then admitted to the hospital a few days later on 09/18/2019 after presenting to the ER again for the chief complaint of iron deficiency anemia (Hbg 7.7) and syncope.  The patient was transfused with 1 unit of blood and given Feraheme. He was discharged with out patient workup to gastroenterology. Of note, he had FOBT's testing performed which was negative although he reported dark stools during this time. Besides the episode of syncope, the patient was asymptomatic with his anemia. He denied fatigue, chest pain, palpitations, or generalized weakness.   The patient is followed by [facility] gastroenterology with Dr. Alessandra Bevels.  The patient had lab work performed which noted iron deficiency with a ferritin of 4.9, iron low at 13, transferritin at 263, saturation at 3%, TIBC at 377.  He states he has a remote history of anemia when he was a child/teenager but denies known anemia since that time. He was started on ferrous sulfate by GI. The patient takes 1 tablet (324 mg) daily and is compliant with this medication. He experiences associated hard  stool for which he uses stool softeners. The patient recently had a colonoscopy and an endoscopy performed on 11/09/2019.  The colonoscopy demonstrated multiple polyps in the colon.  The patient had an endoscopy which was significant for a large esophageal tumor which is circumferential and non-obstructive around the distal esophagus with invasion into the cardia.   Final pathology report notes atypical squamous cell. The patient then had a CT scan of the chest which noted a large mass in the lower third of the esophagus with ill-defined margins, measuring approximately 6.0 x 5.4 x 7.7 cm.  The scan also demonstrated prominent gastrohepatic ligament lymph node adjacent to the lesser curvature, which is concerning for nodal metastatic disease although not ideally imaged on chest CT.  There is no evidence of metastatic disease in the chest.  Today, the patient is feeling fairly well. His main symptoms are related to occasionally feeling that "food is getting stuck" when swallowing solids. He notes this happens "sometimes" but resolves after coughing.  He denies any associated odynophagia. He also notes an approximate 60 lb weight loss since January 2021. He states he used to weight in the 270lb range and now weights around 210 lbs. He denies any abdominal pain, chest pain, melena, or hematochezia at this time.  He denies any nausea, vomiting, diarrhea, constipation, or bloating. He takes pantoprazole for reflux.   The patient's family history significant for mother who had cancer in her "back" although the patient is unsure of the primary site of the malignancy.  The patient's mother also reportedly had heart disease and had underwent CABG.  The patient's father was diagnosed with a brain tumor.  It is unclear if this was a primary brain tumor or a metastatic lesion.  The patient father had a surgical resection of the tumor and lived for several more years. His father passed away at the age of 17.  The patient had  another brother who is deceased from "throat cancer".  The patient has another brother without any known medical problems.  The patient also has a daughter who lives in [place] without any known medical problems.  Patient currently works as a Geophysical data processor.  Patient has a history of alcohol use in which he used to drink 2-3 beers daily.  Currently, the patient reports he drinks 1-2 alcoholic beverages per week.  The patient denies any drug use.  The patient denies any tobacco use but smokes pot daily.  The patient is divorced/widowed and has 1 daughter as noted above.    HPI  Past Medical History:  Diagnosis Date  . Anemia   . Anemia     History reviewed. No pertinent surgical history.  Family History  Problem Relation Age of Onset  . Hyperlipidemia Mother     Social History Social History   Tobacco Use  . Smoking status: Never Smoker  . Smokeless tobacco: Never Used  Substance Use Topics  . Alcohol use: Yes    Alcohol/week: 40.0 standard drinks    Types: 40 Standard drinks or equivalent per week  . Drug use: Yes    Types: Marijuana    No Known Allergies  Current Outpatient Medications  Medication Sig Dispense Refill  . Colchicine 0.6 MG CAPS Take 1 capsule by mouth 2 (two) times daily as needed (gout).     . FEROSUL 325 (65 Fe) MG tablet Take 325 mg by mouth daily.     No current facility-administered medications for this visit.    Review of Systems  REVIEW OF SYSTEMS:   Constitutional: Positive for weight loss. negative for appetite change, chills, fatigue, and fever. HENT: Positive for dysphagia. Negative for mouth sores, nosebleeds, odynophagia, or sore throat.  Eyes: Negative for eye problems and icterus.  Respiratory: Negative for cough, hemoptysis, shortness of breath and wheezing.   Cardiovascular: Negative for chest pain and leg swelling.  Gastrointestinal: Positive for reflux.  Negative for abdominal pain, melena, hematochezia,  constipation, diarrhea, nausea and vomiting.  Genitourinary: Negative for bladder incontinence, difficulty urinating, dysuria, frequency and hematuria.   Musculoskeletal: Negative for back pain, gait problem, neck pain and neck stiffness.  Skin: Negative for itching and rash.  Neurological: Negative for dizziness, extremity weakness, gait problem, headaches, light-headedness and seizures.  Hematological: Negative for adenopathy. Does not bruise/bleed easily.  Psychiatric/Behavioral: Negative for confusion, depression and sleep disturbance. The patient is not nervous/anxious.     PHYSICAL EXAMINATION:  Blood pressure (!) 99/49, pulse 77, temperature 97.7 F (36.5 C), temperature source Temporal, resp. rate 17, height 5\' 11"  (1.803 m), weight 214 lb 8 oz (97.3 kg), SpO2 100 %.  ECOG PERFORMANCE STATUS: 1  Physical Exam  Constitutional: Oriented to person, place, and time and well-developed, well-nourished, and in no distress.  HENT:  Head: Normocephalic and atraumatic.  Mouth/Throat: Oropharynx is clear and moist. No oropharyngeal exudate.  Eyes: Conjunctivae are normal. Right eye exhibits no discharge. Left eye exhibits no discharge. No scleral icterus.  Neck: Normal range of motion. Neck supple.  Cardiovascular: Normal rate, regular rhythm, normal  heart sounds and intact distal pulses.   Pulmonary/Chest: Effort normal and breath sounds normal. No respiratory distress. No wheezes. No rales.  Abdominal: Soft. Bowel sounds are normal. Exhibits no distension and no mass. There is no tenderness.  Musculoskeletal: Normal range of motion. Exhibits no edema.  Lymphadenopathy:    No cervical adenopathy.  Neurological: Alert and oriented to person, place, and time. Exhibits normal muscle tone. Gait normal. Coordination normal.  Skin: Skin is warm and dry. No rash noted. Not diaphoretic. No erythema. No pallor.  Psychiatric: Mood, memory and judgment normal.  Vitals reviewed.  LABORATORY  DATA: Lab Results  Component Value Date   WBC 8.1 11/12/2019   HGB 7.9 (L) 11/12/2019   HCT 26.6 (L) 11/12/2019   MCV 78.7 (L) 11/12/2019   PLT 366 11/12/2019      Chemistry      Component Value Date/Time   NA 140 11/12/2019 1217   K 4.5 11/12/2019 1217   CL 104 11/12/2019 1217   CO2 27 11/12/2019 1217   BUN 11 11/12/2019 1217   CREATININE 0.78 11/12/2019 1217      Component Value Date/Time   CALCIUM 9.1 11/12/2019 1217   ALKPHOS 69 11/12/2019 1217   AST 12 (L) 11/12/2019 1217   ALT 9 11/12/2019 1217   BILITOT 0.2 (L) 11/12/2019 1217       RADIOGRAPHIC STUDIES: CT CHEST W CONTRAST  Result Date: 11/10/2019 CLINICAL DATA:  Esophageal mass diagnosed by endoscopy EXAM: CT CHEST WITH CONTRAST TECHNIQUE: Multidetector CT imaging of the chest was performed during intravenous contrast administration. CONTRAST:  73mL OMNIPAQUE IOHEXOL 300 MG/ML  SOLN COMPARISON:  CT abdomen pelvis, 09/14/2019 FINDINGS: Cardiovascular: No significant vascular findings. Normal heart size. Left coronary artery calcifications. No pericardial effusion. Mediastinum/Nodes: No enlarged mediastinal, hilar, or axillary lymph nodes. There is a large mass of the lower third of the esophagus, with ill-defined margins, measuring approximately 6.0 x 5.4 x 7.7 cm (series 2, image 121, series 6, image 88). This appears enlarged and more appreciably masslike than appearance on prior CT dated 09/14/2019. Thyroid gland, trachea, and esophagus demonstrate no significant findings. Lungs/Pleura: Lungs are clear. No pleural effusion or pneumothorax. Upper Abdomen: No acute abnormality. Multiple cysts of the included kidneys. There are prominent gastrohepatic ligament lymph nodes adjacent to the lesser curvature measuring up to 1.4 x 1.2 cm (series 2, image 149). Musculoskeletal: No chest wall mass or suspicious bone lesions identified. IMPRESSION: 1. There is a large mass of the lower third of the esophagus, with ill-defined margins,  measuring approximately 6.0 x 5.4 x 7.7 cm. This appears enlarged and more appreciably masslike than appearance on prior CT dated 09/14/2019. 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 2. There are prominent gastrohepatic ligament lymph nodes adjacent to the lesser curvature, concerning for nodal metastatic disease although not ideally imaged on this examination of the chest. 3. No evidence of metastatic disease within in the chest. 4. Coronary artery disease. Electronically Signed   By: Eddie Candle M.D.   On: 11/10/2019 13:48    ASSESSMENT:  This is a very pleasant 64 year old Caucasian male with:   1.  Suspicious esophageal mass, biopsy ulcerated, highly atypical squamous cell epithelium -The patient's work-up began in May 2021 after presenting with symptomatic anemia and syncope. -The patient recently had a colonoscopy and an endoscopy performed on 11/09/2019.  The endoscopy was significant for a large esophageal tumor which is circumferential and non-obstructive around the distal esophagus with invasion into the cardia.  -11/10/19  The patient then had a CT scan of the chest which noted a large mass in the lower third of the esophagus with ill-defined margins, measuring approximately 6.0 x 5.4 x 7.7 cm. The scan also demonstrated prominent gastrohepatic ligament lymph node adjacent to the lesser curvature. -Pathology noted ulcerated, highly atypical squamous cell epithelium, a repeat biopsy is recommended.  -Dr. Burr Medico discussed the case with Dr. Alessandra Bevels who agreed to evaluate the patient for repeat biopsy.  -The patient was seen with Dr. Burr Medico today.  Dr. Burr Medico had a lengthy discussion today with the patient about his current condition and recommended treatment options if the pathology is consistent with carcinoma. The final treatment plan will be determined once we can confirm the diagnosis with pathology.  -Dr. Burr Medico also recommends completing the staging work-up with the staging PET  scan. -F/U with Dr. Burr Medico in ~2 weeks -I personally called the patient and discussed the change in plan  2. Iron deficiency anemia secondary to blood loss -Secondary to #1 -The patient received Feraheme x1 in May 2021 while in the hospital for symptomatic anemia. -We will repeat the patient CBC, CMP, iron studies, and ferritin today.  CBC from today demonstrated a hemoglobin of 7.9. Ferritin of 6, total iron of 14, saturation ratio at 5%. -I will arrange for the patient to have Feraheme infusions x2 starting next week.  3. Weight loss and dysphagia -The patient is reporting mild-moderate dysphagia at this time.  His mass is nonobstructive -The patient reports losing approximately 60 pounds since January 2021 -We will place a referral to the nutritionist at the cancer center for evaluation -In the meantime, the patient was encouraged to have a soft food diet and he was given a handout on this diet on his AVS today.  4. Alcohol use  -The patient has a prior history of alcohol abuse.  -He is currently drinking approximately 2-3 alcoholic beverages per week from the previous 3-4 beers daily. -Dr. Burr Medico strongly encouraged the patient to abstain from drinking alcohol.   PLAN: -Referral to dietician -Ordered staging PET scan  -Follow up with GI for consideration of rebiopsy -Repeat CBC, CMP, Ferritin, and iron studies -IV iron feraheme next week and the following week -F/U with Dr. Burr Medico in 2-3 weeks    Tarnisha Kachmar L Stoney Karczewski November 12, 2019, 4:09 PM  Addendum  I have seen the patient, examined him. I agree with the assessment and and plan and have edited the notes.   I reviewed his EGD reports, and discussed with pathologist Dr. Donato Heinz today.  EGD showed a large frail, ulcerated mass in the distal and the GE junction, however biopsy showed highly atypical squamous no definitive malignant cells.  I recommend repeating EGD and biopsy. I spoke with his GI Dr. Alessandra Bevels today and he is  agreeable/   Will get a PET for further evaluation.  I personally reviewed his CT scan images, which is concerning regional lymph node metastasis at gastrohepatic ligament.  If PET scan shows positive lymph node, I do not think we need EGD for staging.  We also discussed iron deficient anemia management and monitoring.  I discussed standard treatment course for locally advanced esophageal cancer which includes neoadjuvant chemo and radiation concurrently, followed by esophagectomy, and adjuvant immunotherapy. Will refer him to rad/onc and thoracic surgeon.  All questions were answered. Plan to discuss his case in thoracic tumor board when the definitive diagnosis is obtained.   [name]  11/12/2019

## 2019-11-11 NOTE — Progress Notes (Signed)
Spoke with patient regarding referral we received from [facility] for esophageal mass.  I explained my role as nurse navigator.  Informed him of appointment for Monday 7/12 at 3 pm with Dr. Burr Medico to arrive no later than 2:45.  He is aware of our location and had no further questions at this time.  Notified [facility] of upcoming appointment. Pathology report will be sent to [place] upon receipt.   Sent records from [facility] to HIM to be scanned into the chart.

## 2019-11-11 NOTE — Progress Notes (Signed)
Spoke with Manuela Schwartz at [facility] Pathology pathology is not back but she spoke with pathologist and they put a rush on it and should have it back tomorrow.  She will fax me the results.

## 2019-11-11 NOTE — Progress Notes (Signed)
Called patient back and asked him to come in tomorrow 7/9 at 11:00 to arrive at 10:45.  He agreed.

## 2019-11-12 ENCOUNTER — Inpatient Hospital Stay (HOSPITAL_BASED_OUTPATIENT_CLINIC_OR_DEPARTMENT_OTHER): Payer: 59 | Admitting: Physician Assistant

## 2019-11-12 ENCOUNTER — Other Ambulatory Visit: Payer: Self-pay | Admitting: Gastroenterology

## 2019-11-12 ENCOUNTER — Inpatient Hospital Stay: Payer: 59

## 2019-11-12 ENCOUNTER — Encounter: Payer: Self-pay | Admitting: Physician Assistant

## 2019-11-12 ENCOUNTER — Other Ambulatory Visit: Payer: Self-pay

## 2019-11-12 VITALS — BP 99/49 | HR 77 | Temp 97.7°F | Resp 17 | Ht 71.0 in | Wt 214.5 lb

## 2019-11-12 DIAGNOSIS — Z510 Encounter for antineoplastic radiation therapy: Secondary | ICD-10-CM | POA: Diagnosis not present

## 2019-11-12 DIAGNOSIS — D500 Iron deficiency anemia secondary to blood loss (chronic): Secondary | ICD-10-CM

## 2019-11-12 DIAGNOSIS — K2289 Other specified disease of esophagus: Secondary | ICD-10-CM | POA: Insufficient documentation

## 2019-11-12 DIAGNOSIS — E669 Obesity, unspecified: Secondary | ICD-10-CM | POA: Diagnosis not present

## 2019-11-12 DIAGNOSIS — K219 Gastro-esophageal reflux disease without esophagitis: Secondary | ICD-10-CM

## 2019-11-12 DIAGNOSIS — R1310 Dysphagia, unspecified: Secondary | ICD-10-CM | POA: Insufficient documentation

## 2019-11-12 DIAGNOSIS — I2510 Atherosclerotic heart disease of native coronary artery without angina pectoris: Secondary | ICD-10-CM | POA: Insufficient documentation

## 2019-11-12 DIAGNOSIS — K229 Disease of esophagus, unspecified: Secondary | ICD-10-CM | POA: Insufficient documentation

## 2019-11-12 DIAGNOSIS — Z01812 Encounter for preprocedural laboratory examination: Secondary | ICD-10-CM | POA: Insufficient documentation

## 2019-11-12 DIAGNOSIS — R634 Abnormal weight loss: Secondary | ICD-10-CM | POA: Diagnosis not present

## 2019-11-12 DIAGNOSIS — F1010 Alcohol abuse, uncomplicated: Secondary | ICD-10-CM | POA: Insufficient documentation

## 2019-11-12 DIAGNOSIS — Z20822 Contact with and (suspected) exposure to covid-19: Secondary | ICD-10-CM | POA: Insufficient documentation

## 2019-11-12 DIAGNOSIS — C155 Malignant neoplasm of lower third of esophagus: Secondary | ICD-10-CM | POA: Insufficient documentation

## 2019-11-12 DIAGNOSIS — Z79899 Other long term (current) drug therapy: Secondary | ICD-10-CM | POA: Diagnosis not present

## 2019-11-12 LAB — CMP (CANCER CENTER ONLY)
ALT: 9 U/L (ref 0–44)
AST: 12 U/L — ABNORMAL LOW (ref 15–41)
Albumin: 3.2 g/dL — ABNORMAL LOW (ref 3.5–5.0)
Alkaline Phosphatase: 69 U/L (ref 38–126)
Anion gap: 9 (ref 5–15)
BUN: 11 mg/dL (ref 8–23)
CO2: 27 mmol/L (ref 22–32)
Calcium: 9.1 mg/dL (ref 8.9–10.3)
Chloride: 104 mmol/L (ref 98–111)
Creatinine: 0.78 mg/dL (ref 0.61–1.24)
GFR, Est AFR Am: >60 mL/min (ref >60)
GFR, Estimated: >60 mL/min (ref >60)
Glucose, Bld: 109 mg/dL — ABNORMAL HIGH (ref 70–99)
Potassium: 4.5 mmol/L (ref 3.5–5.1)
Sodium: 140 mmol/L (ref 135–145)
Total Bilirubin: 0.2 mg/dL — ABNORMAL LOW (ref 0.3–1.2)
Total Protein: 7.1 g/dL (ref 6.5–8.1)

## 2019-11-12 LAB — CBC WITH DIFFERENTIAL (CANCER CENTER ONLY)
Abs Immature Granulocytes: 0.01 10*3/uL (ref 0.00–0.07)
Basophils Absolute: 0 10*3/uL (ref 0.0–0.1)
Basophils Relative: 0 %
Eosinophils Absolute: 0.3 10*3/uL (ref 0.0–0.5)
Eosinophils Relative: 4 %
HCT: 26.6 % — ABNORMAL LOW (ref 39.0–52.0)
Hemoglobin: 7.9 g/dL — ABNORMAL LOW (ref 13.0–17.0)
Immature Granulocytes: 0 %
Lymphocytes Relative: 16 %
Lymphs Abs: 1.3 10*3/uL (ref 0.7–4.0)
MCH: 23.4 pg — ABNORMAL LOW (ref 26.0–34.0)
MCHC: 29.7 g/dL — ABNORMAL LOW (ref 30.0–36.0)
MCV: 78.7 fL — ABNORMAL LOW (ref 80.0–100.0)
Monocytes Absolute: 0.6 10*3/uL (ref 0.1–1.0)
Monocytes Relative: 7 %
Neutro Abs: 5.9 10*3/uL (ref 1.7–7.7)
Neutrophils Relative %: 73 %
Platelet Count: 366 10*3/uL (ref 150–400)
RBC: 3.38 MIL/uL — ABNORMAL LOW (ref 4.22–5.81)
RDW: 18.6 % — ABNORMAL HIGH (ref 11.5–15.5)
WBC Count: 8.1 10*3/uL (ref 4.0–10.5)
nRBC: 0 % (ref 0.0–0.2)

## 2019-11-12 LAB — IRON AND TIBC
Iron: 14 ug/dL — ABNORMAL LOW (ref 42–163)
Saturation Ratios: 5 % — ABNORMAL LOW (ref 20–55)
TIBC: 275 ug/dL (ref 202–409)
UIBC: 262 ug/dL (ref 117–376)

## 2019-11-12 LAB — FERRITIN: Ferritin: 6 ng/mL — ABNORMAL LOW (ref 24–336)

## 2019-11-12 NOTE — Progress Notes (Signed)
Met with patient at initial medical oncology appointment today with [facility]ilingo[facility] PA-C and Dr. Burr Medico.  I explained my role as nurse navigator and he was given a card with my direct contact information and encouraged to call with any questions or concerns.

## 2019-11-12 NOTE — Patient Instructions (Addendum)
-We will check your blood work today to make sure that you do not need another blood transfusion.  We will also recheck her iron studies and plan on giving you an extra dose of IV iron in the next week or so. -We would like you to have a PET scan performed.  The scan is a more sensitive test to assess if there is other areas involved such as the lymph nodes. So far it appears the area of concern is localized.  -I have placed a referral to radiation oncology today.  He will likely see a radiation doctor named Dr. Lisbeth Renshaw.  He will discuss in more detail with you what to expect from possible radiation treatment -There is 2 main types of esophageal cancer.  Adenocarcinoma and squamous cell carcinoma.  Squamous cell carcinoma is more so associated with tobacco use and alcohol use.  Please continue cutting back on your alcohol intake with the goal of quitting completely. -I have added information below regarding a soft food diet.  I also placed referral to the dietitian who will speak with you in more details regarding recommendations. -We may refer you to see a surgeon in the future once we get the results from your PET scan.  It is likely the plan will consist of being treated with chemo and radiation for approximately 6 weeks or so. Possibly surgery following chemotherapy/radiation.  We will call you once we have a more definitive plan based on the final pathology. -Before any treatment is started, he will sit down with a nurse educator one-on-one who will discuss your treatment in more detail as well is give you important and valuable information regarding the cancer center.   Soft-Food Eating Plan A soft-food eating plan includes foods that are safe and easy to chew and swallow. Your health care provider or dietitian can help you find foods and flavors that fit into this plan. Follow this plan until your health care provider or dietitian says it is safe to start eating other foods and food textures. What  are tips for following this plan? General guidelines   Take small bites of food, or cut food into pieces about  inch or smaller. Bite-sized pieces of food are easier to chew and swallow.  Eat moist foods. Avoid overly dry foods.  Avoid foods that: ? Are difficult to swallow, such as dry, chunky, crispy, or sticky foods. ? Are difficult to chew, such as hard, tough, or stringy foods. ? Contain nuts, seeds, or fruits.  Follow instructions from your dietitian about the types of liquids that are safe for you to swallow. You may be allowed to have: ? Thick liquids only. This includes only liquids that are thicker than honey. ? Thin and thick liquids. This includes all beverages and foods that become liquid at room temperature.  To make thick liquids: ? Purchase a commercial liquid thickening powder. These are available at grocery stores and pharmacies. ? Mix the thickener into liquids according to instructions on the label. ? Purchase ready-made thickened liquids. ? Thicken soup by pureeing, straining to remove chunks, and adding flour, potato flakes, or corn starch. ? Add commercial thickener to foods that become liquid at room temperature, such as milk shakes, yogurt, ice cream, gelatin, and sherbet.  Ask your health care provider whether you need to take a fiber supplement. Cooking  Cook meats so they stay tender and moist. Use methods like braising, stewing, or baking in liquid.  Cook vegetables and fruit until they are  soft enough to be mashed with a fork.  Peel soft, fresh fruits such as peaches, nectarines, and melons.  When making soup, make sure chunks of meat and vegetables are smaller than  inch.  Reheat leftover foods slowly so that a tough crust does not form. What foods are allowed? The items listed below may not be a complete list. Talk with your dietitian about what dietary choices are best for you. Grains Breads, muffins, pancakes, or waffles moistened with syrup,  jelly, or butter. Dry cereals well-moistened with milk. Moist, cooked cereals. Well-cooked pasta and rice. Vegetables All soft-cooked vegetables. Shredded lettuce. Fruits All canned and cooked fruits. Soft, peeled fresh fruits. Strawberries. Dairy Milk. Cream. Yogurt. Cottage cheese. Soft cheese without the rind. Meats and other protein foods Tender, moist ground meat, poultry, or fish. Meat cooked in gravy or sauces. Eggs. Sweets and desserts Ice cream. Milk shakes. Sherbet. Pudding. Fats and oils Butter. Margarine. Olive, canola, sunflower, and grapeseed oil. Smooth salad dressing. Smooth cream cheese. Mayonnaise. Gravy. What foods are not allowed? The items listed bemay not be a complete list. Talk with your dietitian about what dietary choices are best for you. Grains Coarse or dry cereals, such as bran, granola, and shredded wheat. Tough or chewy crusty breads, such as [place] bread or baguettes. Breads with nuts, seeds, or fruit. Vegetables All raw vegetables. Cooked corn. Cooked vegetables that are tough or stringy. Tough, crisp, fried potatoes and potato skins. Fruits Fresh fruits with skins or seeds, or both, such as apples, pears, and grapes. Stringy, high-pulp fruits, such as papaya, pineapple, coconut, and mango. Fruit leather and all dried fruit. Dairy Yogurt with nuts or coconut. Meats and other protein foods Hard, dry sausages. Dry meat, poultry, or fish. Meats with gristle. Fish with bones. Fried meat or fish. Lunch meat and hotdogs. Nuts and seeds. Chunky peanut butter or other nut butters. Sweets and desserts Cakes or cookies that are very dry or chewy. Desserts with dried fruit, nuts, or coconut. Fried pastries. Very rich pastries. Fats and oils Cream cheese with fruit or nuts. Salad dressings with seeds or chunks. Summary  A soft-food eating plan includes foods that are safe and easy to swallow. Generally, the foods should be soft enough to be mashed with a  fork.  Avoid foods that are dry, hard to chew, crunchy, sticky, stringy, or crispy.  Ask your health care provider whether you need to thicken your liquids and if you need to take a fiber supplement. This information is not intended to replace advice given to you by your health care provider. Make sure you discuss any questions you have with your health care provider. Document Revised: 08/13/2018 Document Reviewed: 06/25/2016 Elsevier Patient Education  [place].

## 2019-11-15 ENCOUNTER — Ambulatory Visit: Payer: 59 | Admitting: Hematology

## 2019-11-15 ENCOUNTER — Telehealth: Payer: Self-pay | Admitting: Physician Assistant

## 2019-11-15 NOTE — Telephone Encounter (Signed)
Scheduled per los. Called and spoke with patient. Confirmed appt 

## 2019-11-19 ENCOUNTER — Other Ambulatory Visit: Payer: Self-pay

## 2019-11-19 ENCOUNTER — Other Ambulatory Visit (HOSPITAL_COMMUNITY)
Admission: RE | Admit: 2019-11-19 | Discharge: 2019-11-19 | Disposition: A | Discharge status: Home / Self Care | Payer: 59 | Source: Ambulatory Visit | Attending: Gastroenterology | Admitting: Gastroenterology

## 2019-11-19 ENCOUNTER — Inpatient Hospital Stay: Payer: 59

## 2019-11-19 VITALS — BP 105/58 | HR 61 | Temp 98.2°F | Resp 16

## 2019-11-19 DIAGNOSIS — D649 Anemia, unspecified: Secondary | ICD-10-CM

## 2019-11-19 DIAGNOSIS — Z510 Encounter for antineoplastic radiation therapy: Secondary | ICD-10-CM | POA: Diagnosis not present

## 2019-11-19 LAB — SARS CORONAVIRUS 2 (TAT 6-24 HRS): SARS Coronavirus 2: NEGATIVE

## 2019-11-19 MED ORDER — ACETAMINOPHEN 325 MG PO TABS
ORAL_TABLET | ORAL | Status: AC
  Filled 2019-11-19: qty 2

## 2019-11-19 MED ORDER — SODIUM CHLORIDE 0.9 % IV SOLN
510.0000 mg | Freq: Once | INTRAVENOUS | Status: AC
  Administered 2019-11-19: 510 mg via INTRAVENOUS
  Filled 2019-11-19: qty 510

## 2019-11-19 MED ORDER — SODIUM CHLORIDE 0.9 % IV SOLN
Freq: Once | INTRAVENOUS | Status: AC
  Filled 2019-11-19: qty 250

## 2019-11-19 MED ORDER — ACETAMINOPHEN 325 MG PO TABS
650.0000 mg | ORAL_TABLET | Freq: Once | ORAL | Status: AC
  Administered 2019-11-19: 650 mg via ORAL

## 2019-11-19 MED ORDER — DIPHENHYDRAMINE HCL 25 MG PO CAPS
ORAL_CAPSULE | ORAL | Status: AC
  Filled 2019-11-19: qty 2

## 2019-11-19 MED ORDER — DIPHENHYDRAMINE HCL 25 MG PO CAPS
50.0000 mg | ORAL_CAPSULE | Freq: Once | ORAL | Status: AC
  Administered 2019-11-19: 50 mg via ORAL

## 2019-11-19 NOTE — Patient Instructions (Signed)

## 2019-11-22 ENCOUNTER — Other Ambulatory Visit: Payer: Self-pay

## 2019-11-22 ENCOUNTER — Ambulatory Visit (HOSPITAL_COMMUNITY)
Admission: RE | Admit: 2019-11-22 | Discharge: 2019-11-22 | Disposition: A | Discharge status: Home / Self Care | Payer: 59 | Source: Ambulatory Visit | Attending: Physician Assistant | Admitting: Physician Assistant

## 2019-11-22 DIAGNOSIS — K2289 Other specified disease of esophagus: Secondary | ICD-10-CM

## 2019-11-22 DIAGNOSIS — K228 Other specified diseases of esophagus: Secondary | ICD-10-CM | POA: Insufficient documentation

## 2019-11-22 LAB — GLUCOSE, CAPILLARY: Glucose-Capillary: 100 mg/dL — ABNORMAL HIGH (ref 70–99)

## 2019-11-22 MED ORDER — FLUDEOXYGLUCOSE F - 18 (FDG) INJECTION
11.4000 | Freq: Once | INTRAVENOUS | Status: AC | PRN
  Administered 2019-11-22: 11.4 via INTRAVENOUS

## 2019-11-22 NOTE — Progress Notes (Signed)
Pre op call for endo procedure complete. Patient confirmed he has been quarantined since covid screening, will arrive to hospital at 1045, and confirmed he has a driver taking him home post procedure. All questions addressed.

## 2019-11-23 ENCOUNTER — Ambulatory Visit (HOSPITAL_COMMUNITY): Payer: 59 | Admitting: Anesthesiology

## 2019-11-23 ENCOUNTER — Encounter (HOSPITAL_COMMUNITY): Payer: Self-pay | Admitting: Gastroenterology

## 2019-11-23 ENCOUNTER — Encounter (HOSPITAL_COMMUNITY): Payer: 59

## 2019-11-23 ENCOUNTER — Ambulatory Visit (HOSPITAL_COMMUNITY)
Admission: RE | Admit: 2019-11-23 | Discharge: 2019-11-23 | Disposition: A | Discharge status: Home / Self Care | Payer: 59 | Attending: Gastroenterology | Admitting: Gastroenterology

## 2019-11-23 ENCOUNTER — Encounter (HOSPITAL_COMMUNITY)
Admission: RE | Disposition: A | Discharge status: Home / Self Care | Payer: Self-pay | Source: Home / Self Care | Attending: Gastroenterology

## 2019-11-23 DIAGNOSIS — D500 Iron deficiency anemia secondary to blood loss (chronic): Secondary | ICD-10-CM | POA: Diagnosis not present

## 2019-11-23 DIAGNOSIS — Z79899 Other long term (current) drug therapy: Secondary | ICD-10-CM | POA: Insufficient documentation

## 2019-11-23 DIAGNOSIS — K222 Esophageal obstruction: Secondary | ICD-10-CM | POA: Diagnosis not present

## 2019-11-23 DIAGNOSIS — K219 Gastro-esophageal reflux disease without esophagitis: Secondary | ICD-10-CM | POA: Insufficient documentation

## 2019-11-23 DIAGNOSIS — M109 Gout, unspecified: Secondary | ICD-10-CM | POA: Insufficient documentation

## 2019-11-23 DIAGNOSIS — C160 Malignant neoplasm of cardia: Secondary | ICD-10-CM | POA: Insufficient documentation

## 2019-11-23 DIAGNOSIS — R1310 Dysphagia, unspecified: Secondary | ICD-10-CM | POA: Diagnosis present

## 2019-11-23 HISTORY — PX: ESOPHAGOGASTRODUODENOSCOPY (EGD) WITH PROPOFOL: SHX5813

## 2019-11-23 HISTORY — PX: BIOPSY: SHX5522

## 2019-11-23 SURGERY — ESOPHAGOGASTRODUODENOSCOPY (EGD) WITH PROPOFOL
Anesthesia: Monitor Anesthesia Care

## 2019-11-23 MED ORDER — PROPOFOL 500 MG/50ML IV EMUL
INTRAVENOUS | Status: AC
  Filled 2019-11-23: qty 50

## 2019-11-23 MED ORDER — PROPOFOL 10 MG/ML IV BOLUS
INTRAVENOUS | Status: DC | PRN
  Administered 2019-11-23 (×2): 40 mg via INTRAVENOUS
  Administered 2019-11-23 (×4): 20 mg via INTRAVENOUS

## 2019-11-23 MED ORDER — LIDOCAINE 2% (20 MG/ML) 5 ML SYRINGE
INTRAMUSCULAR | Status: DC | PRN
  Administered 2019-11-23: 100 mg via INTRAVENOUS

## 2019-11-23 MED ORDER — PROPOFOL 10 MG/ML IV BOLUS
INTRAVENOUS | Status: AC
  Filled 2019-11-23: qty 20

## 2019-11-23 MED ORDER — PROPOFOL 500 MG/50ML IV EMUL
INTRAVENOUS | Status: DC | PRN
  Administered 2019-11-23: 100 ug/kg/min via INTRAVENOUS

## 2019-11-23 MED ORDER — LACTATED RINGERS IV SOLN
INTRAVENOUS | Status: DC
  Administered 2019-11-23: 1000 mL via INTRAVENOUS

## 2019-11-23 NOTE — H&P (Signed)
Patient interval history reviewed.  Patient examined again.  There has been no change from documented H/P scanned into chart from our office except as documented below.  Assessment:  1.  Esophageal mass, biopsies worrisome for malignancy but histology not definitive. 2.  PET scan, no organ metastases but regional adenopathy noted.  Plan:  1.  EGD with repeat biopsies.  Based on PET scan findings, EUS is not necessary. 2.  Risks (bleeding, infection, bowel perforation that could require surgery, sedation-related changes in cardiopulmonary systems), benefits (identification and possible treatment of source of symptoms, exclusion of certain causes of symptoms), and alternatives (watchful waiting, radiographic imaging studies, empiric medical treatment) of upper endoscopy (EGD) were explained to patient/family in detail and patient wishes to proceed.

## 2019-11-23 NOTE — Op Note (Signed)
[facility] Patient Name: Brett Dawson Procedure Date: 11/23/2019 MRN: [id number] Attending MD: Arta Silence , MD Date of Birth: [birth year] CSN: [id number] Age: 64 Admit Type: Outpatient Procedure:                Upper GI endoscopy Indications:              Dysphagia, Abnormal PET scan of the GI tract,                            history of malignant esophageal neoplasm Providers:                Arta Silence, MD, Cleda Daub, RN, Laverda Sorenson, Technician, Tyrone Apple, Technician,                            Danley Danker, CRNA Referring MD:             [name], MD Medicines:                Monitored Anesthesia Care Complications:            No immediate complications. Estimated Blood Loss:     Estimated blood loss was minimal. Procedure:                Pre-Anesthesia Assessment:                           - Prior to the procedure, a History and Physical                            was performed, and patient medications and                            allergies were reviewed. The patient's tolerance of                            previous anesthesia was also reviewed. The risks                            and benefits of the procedure and the sedation                            options and risks were discussed with the patient.                            All questions were answered, and informed consent                            was obtained. Prior Anticoagulants: The patient has                            taken no previous anticoagulant or antiplatelet  agents. ASA Grade Assessment: II - A patient with                            mild systemic disease. After reviewing the risks                            and benefits, the patient was deemed in                            satisfactory condition to undergo the procedure.                           After obtaining informed consent, the endoscope was                             passed under direct vision. Throughout the                            procedure, the patient's blood pressure, pulse, and                            oxygen saturations were monitored continuously. The                            GIF-H190 ([id number]) Olympus gastroscope was                            introduced through the mouth, and advanced to the                            second part of duodenum. The upper GI endoscopy was                            accomplished without difficulty. The patient                            tolerated the procedure well. Scope In: Scope Out: Findings:      A large, fungating and ulcerating mass was found in the lower third of       the esophagus, beginning 34 cm from the incisors and extending       circumferentially and confluently to the GE junction (41 cm) and further       into the cardia. The mass was partially obstructing and circumferential;       diagnostic endoscope could pass through tumor with mild resistance.       Biopsies were taken with a cold forceps for histology. Estimated blood       loss was minimal.      The exam of the esophagus was otherwise normal.      A medium-sized, fungating, infiltrative and ulcerated, circumferential       mass with no bleeding was found in the cardia, seemingly an extensive of       the distal esophageal mass (or vice versa). Biopsies were taken with a       cold forceps for histology.  The exam of the stomach was otherwise normal.      The duodenal bulb, first portion of the duodenum and second portion of       the duodenum were normal. Impression:               - Partially obstructing, likely malignant                            esophageal tumor was found in the lower third of                            the esophagus. Biopsied.                           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                            Biopsied.                           - Normal duodenal bulb, first  portion of the                            duodenum and second portion of the duodenum. Moderate Sedation:      Not Applicable - Patient had care per Anesthesia. Recommendation:           - Patient has a contact number available for                            emergencies. The signs and symptoms of potential                            delayed complications were discussed with the                            patient. Return to normal activities tomorrow.                            Written discharge instructions were provided to the                            patient.                           - Discharge patient to home (via wheelchair).                           - Chopped diet and mechanical soft diet                            indefinitely.                           - Continue present medications.                           - Await  pathology results.                           - Return to GI clinic PRN.                           - Return to referring physician as previously                            scheduled. Procedure Code(s):        --- Professional ---                           [phone], Esophagogastroduodenoscopy, flexible,                            transoral; with biopsy, single or multiple Diagnosis Code(s):        --- Professional ---                           D49.0, Neoplasm of unspecified behavior of                            digestive system                           R13.10, Dysphagia, unspecified                           Z85.01, Personal history of malignant neoplasm of                            esophagus                           R93.3, Abnormal findings on diagnostic imaging of                            other parts of digestive tract CPT copyright 2019 [employer]. All rights reserved. The codes documented in this report are preliminary and upon coder review may  be revised to meet current compliance requirements. Arta Silence, MD 11/23/2019 12:35:32  PM This report has been signed electronically. Number of Addenda: 0

## 2019-11-23 NOTE — Discharge Instructions (Signed)
Endoscopy °Care After °Please read the instructions outlined below and refer to this sheet in the next few weeks. These discharge instructions provide you with general information on caring for yourself after you leave the hospital. Your doctor may also give you specific instructions. While your treatment has been planned according to the most current medical practices available, unavoidable complications occasionally occur. If you have any problems or questions after discharge, please call Dr. Jodee Wagenaar ([facility]) at [phone]. ° °HOME CARE INSTRUCTIONS °Activity °· You may resume your regular activity but move at a slower pace for the next 24 hours.  °· Take frequent rest periods for the next 24 hours.  °· Walking will help expel (get rid of) the air and reduce the bloated feeling in your abdomen.  °· No driving for 24 hours (because of the anesthesia (medicine) used during the test).  °· You may shower.  °· Do not sign any important legal documents or operate any machinery for 24 hours (because of the anesthesia used during the test).  °Nutrition °· Drink plenty of fluids.  °· You may resume your normal diet.  °· Begin with a light meal and progress to your normal diet.  °· Avoid alcoholic beverages for 24 hours or as instructed by your caregiver.  °Medications °You may resume your normal medications unless your caregiver tells you otherwise. °What you can expect today °· You may experience abdominal discomfort such as a feeling of fullness or "gas" pains.  °· You may experience a sore throat for 2 to 3 days. This is normal. Gargling with salt water may help this.  °·  °SEEK IMMEDIATE MEDICAL CARE IF: °· You have excessive nausea (feeling sick to your stomach) and/or vomiting.  °· You have severe abdominal pain and distention (swelling).  °· You have trouble swallowing.  °· You have a temperature over 100° F (37.8° C).  °· You have rectal bleeding or vomiting of blood.  °Document Released:  12/05/2003 Document Revised: 01/02/2011 Document Reviewed: 06/17/2007 °[employer]® Patient Information ©2012 [employer]. °

## 2019-11-23 NOTE — Anesthesia Preprocedure Evaluation (Addendum)
Anesthesia Evaluation  Patient identified by MRN, date of birth, ID band Patient awake    Reviewed: Allergy & Precautions, NPO status , Patient's Chart, lab work & pertinent test results  Airway Mallampati: II  TM Distance: >3 FB Neck ROM: Full    Dental no notable dental hx. (+) Poor Dentition, Chipped, Missing, Loose, Dental Advisory Given   Pulmonary    Pulmonary exam normal breath sounds clear to auscultation       Cardiovascular Normal cardiovascular exam Rhythm:Regular Rate:Normal     Neuro/Psych negative psych ROS   GI/Hepatic (+)     substance abuse  marijuana use,   Endo/Other    Renal/GU   negative genitourinary   Musculoskeletal   Abdominal   Peds  Hematology  (+) Blood dyscrasia, anemia ,   Anesthesia Other Findings   Reproductive/Obstetrics                           Anesthesia Physical Anesthesia Plan  ASA: II  Anesthesia Plan: MAC   Post-op Pain Management:    Induction: Intravenous  PONV Risk Score and Plan: 1 and Treatment may vary due to age or medical condition  Airway Management Planned: Mask and Natural Airway  Additional Equipment:   Intra-op Plan:   Post-operative Plan:   Informed Consent: I have reviewed the patients History and Physical, chart, labs and discussed the procedure including the risks, benefits and alternatives for the proposed anesthesia with the patient or authorized representative who has indicated his/her understanding and acceptance.     Dental advisory given  Plan Discussed with: Anesthesiologist  Anesthesia Plan Comments:       Anesthesia Quick Evaluation

## 2019-11-23 NOTE — Transfer of Care (Signed)
Immediate Anesthesia Transfer of Care Note  Patient: Brett Dawson  Procedure(s) Performed: ESOPHAGOGASTRODUODENOSCOPY (EGD) WITH PROPOFOL (N/A )  Patient Location: Endoscopy Unit  Anesthesia Type:MAC  Level of Consciousness: alert  and oriented  Airway & Oxygen Therapy: Patient Spontanous Breathing  Post-op Assessment: Report given to RN and Post -op Vital signs reviewed and stable  Post vital signs: Reviewed and stable  Last Vitals:  Vitals Value Taken Time  BP    Temp    Pulse    Resp    SpO2      Last Pain:  Vitals:   11/23/19 1054  TempSrc: Oral  PainSc: 0-No pain         Complications: No complications documented.

## 2019-11-23 NOTE — Anesthesia Postprocedure Evaluation (Signed)
Anesthesia Post Note  Patient: Brett Dawson  Procedure(s) Performed: ESOPHAGOGASTRODUODENOSCOPY (EGD) WITH PROPOFOL (N/A ) BIOPSY     Patient location during evaluation: PACU Anesthesia Type: MAC Level of consciousness: awake and alert Pain management: pain level controlled Vital Signs Assessment: post-procedure vital signs reviewed and stable Respiratory status: spontaneous breathing, nonlabored ventilation, respiratory function stable and patient connected to nasal cannula oxygen Cardiovascular status: stable and blood pressure returned to baseline Postop Assessment: no apparent nausea or vomiting Anesthetic complications: no   No complications documented.  Last Vitals:  Vitals:   11/23/19 1245 11/23/19 1250  BP:  107/63  Pulse: 67 (!) 58  Resp: 15 13  Temp:    SpO2: 92% 97%    Last Pain:  Vitals:   11/23/19 1250  TempSrc:   PainSc: 0-No pain                 Shirely Toren

## 2019-11-24 ENCOUNTER — Other Ambulatory Visit: Payer: Self-pay

## 2019-11-24 ENCOUNTER — Ambulatory Visit
Admission: RE | Admit: 2019-11-24 | Discharge: 2019-11-24 | Disposition: A | Discharge status: Home / Self Care | Payer: 59 | Source: Ambulatory Visit | Attending: Radiation Oncology | Admitting: Radiation Oncology

## 2019-11-24 ENCOUNTER — Encounter: Payer: Self-pay | Admitting: Radiation Oncology

## 2019-11-24 VITALS — Ht 71.0 in | Wt 210.0 lb

## 2019-11-24 DIAGNOSIS — C155 Malignant neoplasm of lower third of esophagus: Secondary | ICD-10-CM

## 2019-11-24 DIAGNOSIS — K2289 Other specified disease of esophagus: Secondary | ICD-10-CM

## 2019-11-24 HISTORY — DX: Malignant neoplasm of lower third of esophagus: C15.5

## 2019-11-24 LAB — SURGICAL PATHOLOGY

## 2019-11-24 NOTE — Progress Notes (Signed)
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  Offered for Dr. Burr Medico to see him Friday 7/23 at 7:45 prior to his iron infusion.  He agreed and appreciated being able to talk to her sooner than Monday 7/26.  I changed his nutritionist appointment for 7/26 to a Telephone call so he does not have to come in that day.

## 2019-11-24 NOTE — Progress Notes (Addendum)
GI Location of Tumor / Histology: Mass at lower third esophagus  Brett Dawson presented in early May 2021 with symptomatic anemia and syncopal episode.  Upper Endoscopy 11/23/2019: A large, fungating and ulcerating mass was found in the lower third of the esophagus, beginning 34 cm from the incisors and extending circumferentially and confluently to the GE junction and further into the cardia. The mass was partially obstructing and circumferential;  PET 11/22/2019: Intensely hypermetabolic (max SUV 88.1) lower thoracic esophageal 6.0 x 5.5 x 9.4 cm mass extending to the esophagogastric junction with probable involvement of the gastric cardia, compatible with primary esophageal malignancy.   Hypermetabolic gastrohepatic ligament nodal metastases, largest 1.1 cm.  No hypermetabolic liver or other distant metastases.  CT Chest 11/10/2019: large mass in the lower third of the esophagus with ill defined margins, measuring approximately 6.0 x 5.4 x 7.7 cm.  Prominent gastrohepatic ligament lymph node adjacent to the lesser curvature, which is concerning for nodal metastatic disease although not ideally imaged on chest CT.  There is no evidence of metastatic disease in the chest.  Endoscopy 11/09/2019: large esophageal tumor which is circumferential and non-obstructive around the distal esophagus with invasion into the cardia.  Biopsies of GE Junction Mass 11/23/2019   Past/Anticipated interventions by surgeon, if any:   Past/Anticipated interventions by medical oncology, if any: PA Heilingoetter/ Dr. Burr Medico 11/12/2019 -Dr. Burr Medico discussed the case with Dr. Alessandra Bevels who agreed to evaluate the patient for repeat biopsy.  -The patient was seen with Dr. Burr Medico today.  Dr. Burr Medico had a lengthy discussion today with the patient about his current condition and recommended treatment options if the pathology is consistent with carcinoma. The final treatment plan will be determined once we can confirm the diagnosis with  pathology.  -Dr. Burr Medico also recommends completing the staging work-up with the staging PET scan. -F/U with Dr. Burr Medico in 11/29/2019   Weight changes, if any: Lost about 60 pounds since January/February 2021.  Bowel/Bladder complaints, if any: Has some constipation and black stools, he is taking iron daily.  Using stool softeners.  Nausea / Vomiting, if any: no  Pain issues, if any:  no  Appetite: Eating softer foods.  He is staying away from tougher meats.     SAFETY ISSUES:  Prior radiation? No  Pacemaker/ICD? No  Possible current pregnancy? n/a  Is the patient on methotrexate? No  Current Complaints/Details:

## 2019-11-24 NOTE — Progress Notes (Signed)
Radiation Oncology         [phone] ________________________________  Initial Outpatient Consultation - Conducted via telephone due to current COVID-19 concerns for limiting patient exposure  I spoke with the patient to conduct this consult visit via telephone to spare the patient unnecessary potential exposure in the healthcare setting during the current COVID-19 pandemic. The patient was notified in advance and was offered a [place] meeting to allow for face to face communication but unfortunately reported that they did not have the appropriate resources/technology to support such a visit and instead preferred to proceed with a telephone consult.    Name: Brett Dawson        MRN: [id number]  Date of Service: 11/24/2019 DOB: [birth year]  CC:Sun, Gari Crown, MD  Heilingoetter, Cassandr*     REFERRING PHYSICIAN: Heilingoetter, Cassandr*   DIAGNOSIS: The primary encounter diagnosis was Esophageal mass. A diagnosis of Malignant neoplasm of lower third of esophagus (HCC) was also pertinent to this visit.   HISTORY OF PRESENT ILLNESS: Brett Dawson is a 64 y.o. male seen at the request of Dr. Burr Medico for a probable esophageal malignancy who is still in the midst of work up. He developed symptoms felt to be related to reflux but routine blood work at his PCP office showed a hgb of 6.7, and he was sent for ED evaluation which included a CT of the abdomen and pelvis which showed a hiatial hernia. He represented back to the ED after brief admission with syncope and a hgb of 7.7. he was discharged with outpatient evaluation with gastroenterology after receiving blood products and has continued on iron supplementation. A colonoscopy and EGD was recommended and on 11/09/19 this was performed revealing multiple colon polyps and a large but non obstructing mass in the distal esophagus extending to and invading the gastric cardia. A biopsy of esophageal mass was consistent with atypical features, suspicious for  squamous cell carcinoma. A CT on 11/10/19 revealed a mass in the lower third of the esophagus measuring 6 x 5.4 x 7.7 cm with prominent gastrohepatic nodes measuring up to 14 x 12 mm. He had a PET scan on 11/22/19 that revealed hypermetabolic change with an SUV of 20.2 extending to the EG junction from the lower thoracic esophagus. No hypermetabolic findings were noted in the chest, but there was hypermetabolism of 4.8 in the gastrohepatic ligament nodes the largest measuring 1.1 cm. There was a simple 5.1 cm right liver cyst, and bilateral renal cysts. No other sites of disease were noted. He underwent repeat EGD yesterday and pathology confirmed squamous cell carcinoma. He's contacted to discuss treatment options of his cancer.   PREVIOUS RADIATION THERAPY: No   PAST MEDICAL HISTORY:  Past Medical History:  Diagnosis Date  . Anemia   . Anemia   . Malignant neoplasm of lower third of esophagus ([place]) 11/24/2019       PAST SURGICAL HISTORY: Past Surgical History:  Procedure Laterality Date  . TONSILLECTOMY       FAMILY HISTORY:  Family History  Problem Relation Age of Onset  . Hyperlipidemia Mother      SOCIAL HISTORY:  reports that he has never smoked. He has never used smokeless tobacco. He reports current alcohol use of about 40.0 standard drinks of alcohol per week. He reports current drug use. Drug: Marijuana. The patient is separated and lives in [place]. 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   ALLERGIES: Patient has no known allergies.   MEDICATIONS:  Current Outpatient Medications  Medication Sig Dispense Refill  . Colchicine 0.6 MG CAPS Take 1 capsule by mouth 2 (two) times daily as needed (gout).     . FEROSUL 325 (65 Fe) MG tablet Take 325 mg by mouth daily.     No current facility-administered medications for this encounter.     REVIEW OF SYSTEMS: On review of systems, the patient reports that he is doing well overall. He had  been able to eat solid foods but has lost weight that he attributed to discontinuing beer, and he's lost about 50 pounds since January. He denies any chest pain, shortness of breath, cough, fevers, chills, night sweats. He denies any bowel or bladder disturbances, and denies abdominal pain, nausea or vomiting. He denies any new musculoskeletal or joint aches or pains. A complete review of systems is obtained and is otherwise negative.     PHYSICAL EXAM:  Wt Readings from Last 3 Encounters:  11/24/19 210 lb (95.3 kg)  11/23/19 210 lb (95.3 kg)  11/12/19 214 lb 8 oz (97.3 kg)   Unable to assess due to encounter type.  ECOG = 1  0 - Asymptomatic (Fully active, able to carry on all predisease activities without restriction)  1 - Symptomatic but completely ambulatory (Restricted in physically strenuous activity but ambulatory and able to carry out work of a light or sedentary nature. For example, light housework, office work)  2 - Symptomatic, <50% in bed during the day (Ambulatory and capable of all self care but unable to carry out any work activities. Up and about more than 50% of waking hours)  3 - Symptomatic, >50% in bed, but not bedbound (Capable of only limited self-care, confined to bed or chair 50% or more of waking hours)  4 - Bedbound (Completely disabled. Cannot carry on any self-care. Totally confined to bed or chair)  5 - Death   Brett Dawson MM, Creech RH, Tormey DC, et al. [phone]). "Toxicity and response criteria of the [facility]". [place] Oncol. 5 (6): 649-55    LABORATORY DATA:  Lab Results  Component Value Date   WBC 8.1 11/12/2019   HGB 7.9 (L) 11/12/2019   HCT 26.6 (L) 11/12/2019   MCV 78.7 (L) 11/12/2019   PLT 366 11/12/2019   Lab Results  Component Value Date   NA 140 11/12/2019   K 4.5 11/12/2019   CL 104 11/12/2019   CO2 27 11/12/2019   Lab Results  Component Value Date   ALT 9 11/12/2019   AST 12 (L) 11/12/2019   ALKPHOS 69  11/12/2019   BILITOT 0.2 (L) 11/12/2019      RADIOGRAPHY: CT CHEST W CONTRAST  Result Date: 11/10/2019 CLINICAL DATA:  Esophageal mass diagnosed by endoscopy EXAM: CT CHEST WITH CONTRAST TECHNIQUE: Multidetector CT imaging of the chest was performed during intravenous contrast administration. CONTRAST:  24mL OMNIPAQUE IOHEXOL 300 MG/ML  SOLN COMPARISON:  CT abdomen pelvis, 09/14/2019 FINDINGS: Cardiovascular: No significant vascular findings. Normal heart size. Left coronary artery calcifications. No pericardial effusion. Mediastinum/Nodes: No enlarged mediastinal, hilar, or axillary lymph nodes. There is a large mass of the lower third of the esophagus, with ill-defined margins, measuring approximately 6.0 x 5.4 x 7.7 cm (series 2, image 121, series 6, image 88). This appears enlarged and more appreciably masslike than appearance on prior CT dated 09/14/2019. Thyroid gland, trachea, and esophagus demonstrate no significant findings. Lungs/Pleura: Lungs are clear. No pleural effusion or pneumothorax. Upper Abdomen: No acute abnormality. Multiple cysts  of the included kidneys. There are prominent gastrohepatic ligament lymph nodes adjacent to the lesser curvature measuring up to 1.4 x 1.2 cm (series 2, image 149). Musculoskeletal: No chest wall mass or suspicious bone lesions identified. IMPRESSION: 1. There is a large mass of the lower third of the esophagus, with ill-defined margins, measuring approximately 6.0 x 5.4 x 7.7 cm. This appears enlarged and more appreciably masslike than appearance on prior CT dated 09/14/2019. 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 2. There are prominent gastrohepatic ligament lymph nodes adjacent to the lesser curvature, concerning for nodal metastatic disease although not ideally imaged on this examination of the chest. 3. No evidence of metastatic disease within in the chest. 4. Coronary artery disease. Electronically Signed   By: Eddie Candle M.D.   On:  11/10/2019 13:48   NM PET Image Initial (PI) Skull Base To Thigh  Result Date: 11/22/2019 CLINICAL DATA:  Initial treatment strategy for esophageal mass suspected to be malignant. EXAM: NUCLEAR MEDICINE PET SKULL BASE TO THIGH TECHNIQUE: 11.4 mCi F-18 FDG was injected intravenously. Full-ring PET imaging was performed from the skull base to thigh after the radiotracer. CT data was obtained and used for attenuation correction and anatomic localization. Fasting blood glucose: 100 mg/dl COMPARISON:  11/10/2019 chest CT.  09/14/2019 CT abdomen/pelvis. FINDINGS: Mediastinal blood pool activity: SUV max 2.8 Liver activity: SUV max NA NECK: No hypermetabolic lymph nodes in the neck. Incidental CT findings: none CHEST: Intensely hypermetabolic lower thoracic esophageal mass measuring 6.0 x 5.5 cm axial dimensions and approximately 9.4 cm craniocaudal extent with max SUV 20.2 (series 4/image 97), extending to the esophagogastric junction with probable involvement of the gastric cardia. No enlarged or hypermetabolic axillary, mediastinal or hilar lymph nodes. No hypermetabolic pulmonary findings. Incidental CT findings: Coronary atherosclerosis. Atherosclerotic nonaneurysmal thoracic aorta. No acute consolidative airspace disease or significant pulmonary nodules. ABDOMEN/PELVIS: A few mildly enlarged hypermetabolic gastrohepatic ligament nodes, largest 1.1 cm with max SUV 4.8 (series 4/image 113). No additional enlarged or hypermetabolic lymph nodes in the abdomen. No abnormal hypermetabolic activity within the liver, pancreas, adrenal glands, or spleen. No hypermetabolic lymph nodes in the pelvis. Incidental CT findings: Simple 5.1 cm inferior right liver cyst. Numerous bilateral renal cysts, largest 3.9 cm in the lateral upper right kidney with thin faintly calcified internal septation. Mildly atherosclerotic nonaneurysmal abdominal aorta. Marked diffuse colonic diverticulosis. SKELETON: No focal hypermetabolic activity  to suggest skeletal metastasis. Incidental CT findings: none IMPRESSION: 1. Intensely hypermetabolic (max SUV 45.8) lower thoracic esophageal 6.0 x 5.5 x 9.4 cm mass extending to the esophagogastric junction with probable involvement of the gastric cardia, compatible with primary esophageal malignancy. 2. Hypermetabolic gastrohepatic ligament nodal metastases. 3. No hypermetabolic liver or other distant metastases. 4. Chronic findings include: Aortic Atherosclerosis (ICD10-I70.0). Coronary atherosclerosis. Marked diffuse colonic diverticulosis. Electronically Signed   By: Ilona Sorrel M.D.   On: 11/22/2019 17:11       IMPRESSION/PLAN: 1. Probable Squamous Cell Carcinoma of overlapping sites of the esophagus. Dr. Lisbeth Renshaw discusses the pathology findings and work up thus far and reviews the nature of esophageal cancer. Dr. Lisbeth Renshaw believes he is good candidate for chemoRT and would offer this therapy along with Dr. Burr Medico.  We discussed the risks, benefits, short, and long term effects of radiotherapy, and the patient is interested in proceeding. Dr. Lisbeth Renshaw discusses the delivery and logistics of radiotherapy and anticipates a course of 5 1/2 weeks of radiotherapy. He will simulate on Friday this week and at that time sign  written consent to proceed. We anticipate starting therapy 12/06/19.  Given current concerns for patient exposure during the COVID-19 pandemic, this encounter was conducted via telephone.  The patient has provided two factor identification and has given verbal consent for this type of encounter and has been advised to only accept a meeting of this type in a secure network environment. The time spent during this encounter was 60 minutes including preparation, discussion, and coordination of the patient's care. The attendants for this meeting include Blenda Nicely, RN, Dr. Lisbeth Renshaw, Hayden Pedro  and [name].  During the encounter,  Blenda Nicely, RN, Dr. Lisbeth Renshaw, and Hayden Pedro  were located at [facility] Radiation Oncology Department.  JANARD CULP was located at home.    The above documentation reflects my direct findings during this shared patient visit. Please see the separate note by Dr. Lisbeth Renshaw on this date for the remainder of the patient's plan of care.    Carola Rhine, PAC

## 2019-11-25 ENCOUNTER — Ambulatory Visit: Payer: 59 | Admitting: Radiation Oncology

## 2019-11-25 NOTE — Progress Notes (Signed)
[place]   Telephone:(336) [phone] Fax:(336) [phone]   Clinic Follow up Note   Patient Care Team: [name], MD as PCP - General (Family Medicine) [name], MD as Consulting Physician (Oncology) Jonnie Finner, RN as Registered Nurse Otis Brace, MD as Consulting Physician (Gastroenterology) Barbaraann Faster, RN as [facility] [facility]  Date of Service:  11/26/2019  CHIEF COMPLAINT: Esophoageal cancer  SUMMARY OF ONCOLOGIC HISTORY: Oncology History Overview Note  Cancer Staging Malignant neoplasm of lower third of esophagus ([place]) Staging form: Esophagus - Squamous Cell Carcinoma, AJCC 8th Edition - Clinical stage from 11/23/2019: Stage Unknown (cTX, cN1, cM0) - Signed by [name], MD on 11/26/2019     Malignant neoplasm of lower third of esophagus ([place])  09/14/2019 Imaging   CT AP W Contrast 09/14/19 IMPRESSION: 1. Large hiatal hernia. 2. Hepatic and bilateral simple renal cysts. 3. Colonic diverticulosis. 4. Small fat containing left inguinal hernia.   11/10/2019 Imaging   CT Chest 11/10/19  IMPRESSION: 1. There is a large mass of the lower third of the esophagus, with ill-defined margins, measuring approximately 6.0 x 5.4 x 7.7 cm. This appears enlarged and more appreciably masslike than appearance on prior CT dated 09/14/2019. 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 2. There are prominent gastrohepatic ligament lymph nodes adjacent to the lesser curvature, concerning for nodal metastatic disease although not ideally imaged on this examination of the chest. 3. No evidence of metastatic disease within in the chest. 4. Coronary artery disease.   11/22/2019 PET scan   PET 11/22/19 IMPRESSION: 1. Intensely hypermetabolic (max SUV 27.0) lower thoracic esophageal 6.0 x 5.5 x 9.4 cm mass extending to the esophagogastric junction with probable involvement of the gastric cardia, compatible with primary  esophageal malignancy. 2. Hypermetabolic gastrohepatic ligament nodal metastases. 3. No hypermetabolic liver or other distant metastases. 4. Chronic findings include: Aortic Atherosclerosis (ICD10-I70.0). Coronary atherosclerosis. Marked diffuse colonic diverticulosis.   11/23/2019 Procedure   EGD with Upper Endoscopy by Dr [name] 11/23/19 IMPRESSION - Partially obstructing, likely malignant esophageal tumor was found in the lower third of the esophagus. Biopsied. 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 Biopsied. - Normal duodenal bulb, first portion of the duodenum and second portion of the duodenum.   11/23/2019 Initial Biopsy   FINAL MICROSCOPIC DIAGNOSIS:   A. STOMACH, CARDIA, BIOPSY:  - Invasive well-differentiated squamous cell carcinoma.  See comment   B. ESOPHAGUS, DISTAL, BIOPSY:  - Invasive well-differentiated squamous cell carcinoma.  See comment     COMMENT:   A  B.   Dr. Tresa Moore reviewed the case and concurs with the diagnosis.  Dr. [name] was paged on 11/24/2019.    11/23/2019 Cancer Staging   Staging form: Esophagus - Squamous Cell Carcinoma, AJCC 8th Edition - Clinical stage from 11/23/2019: Stage Unknown (cTX, cN1, cM0) - Signed by [name], MD on 11/26/2019   11/24/2019 Initial Diagnosis   Malignant neoplasm of lower third of esophagus ([place])      CURRENT THERAPY:  Pending concurrent chemoradiation with weekly carboplatin and Taxol  INTERVAL HISTORY:  Brett Dawson is here for a follow up of Esophageal cancer. He presents to the clinic alone.  He underwent a second EGD and a biopsy earlier this week, which confirmed squamous cell carcinoma.  He is clinically stable, dysphagia is mild and is stable, he is tolerating most of food well.  He did lose about 6 pounds in the past 2 weeks.  No odynophagia, or  new complaints.  Review of system otherwise negative.  MEDICAL HISTORY:  Past Medical History:  Diagnosis Date  . Anemia   . Anemia   . Malignant  neoplasm of lower third of esophagus ([place]) 11/24/2019    SURGICAL HISTORY: Past Surgical History:  Procedure Laterality Date  . BIOPSY  11/23/2019   Procedure: BIOPSY;  Surgeon: Arta Silence, MD;  Location: [facility];  Service: Endoscopy;;  . ESOPHAGOGASTRODUODENOSCOPY (EGD) WITH PROPOFOL N/A 11/23/2019   Procedure: ESOPHAGOGASTRODUODENOSCOPY (EGD) WITH PROPOFOL;  Surgeon: Arta Silence, MD;  Location: [facility] ENDOSCOPY;  Service: Endoscopy;  Laterality: N/A;  . TONSILLECTOMY      I have reviewed the social history and family history with the patient and they are unchanged from previous note.  ALLERGIES:  has No Known Allergies.  MEDICATIONS:  Current Outpatient Medications  Medication Sig Dispense Refill  . Colchicine 0.6 MG CAPS Take 1 capsule by mouth 2 (two) times daily as needed (gout).     . FEROSUL 325 (65 Fe) MG tablet Take 325 mg by mouth daily.     No current facility-administered medications for this visit.    PHYSICAL EXAMINATION: ECOG PERFORMANCE STATUS: 1 - Symptomatic but completely ambulatory  Vitals:   11/26/19 0745  BP: (!) 109/59  Pulse: 69  Resp: 18  Temp: (!) 96.3 F (35.7 C)  SpO2: 100%   Filed Weights   11/26/19 0745  Weight: (!) 208 lb 6 oz (94.5 kg)    GENERAL:alert, no distress and comfortable SKIN: skin color, texture, turgor are normal, no rashes or significant lesions EYES: normal, Conjunctiva are pink and non-injected, sclera clear NECK: supple, thyroid normal size, non-tender, without nodularity LYMPH:  no palpable lymphadenopathy in the cervical, axillary  LUNGS: clear to auscultation and percussion with normal breathing effort HEART: regular rate & rhythm and no murmurs and no lower extremity edema ABDOMEN:abdomen soft, non-tender and normal bowel sounds Musculoskeletal:no cyanosis of digits and no clubbing  NEURO: alert & oriented x 3 with fluent speech, no focal motor/sensory deficits  LABORATORY DATA:  I have reviewed the data  as listed CBC Latest Ref Rng & Units 11/12/2019 09/19/2019 09/19/2019  WBC 4.0 - 10.5 K/uL 8.1 - 9.4  Hemoglobin 13.0 - 17.0 g/dL 7.9(L) 7.7(L) 7.7(L)  Hematocrit 39 - 52 % 26.6(L) 25.9(L) 25.6(L)  Platelets 150 - 400 K/uL 366 - 377     CMP Latest Ref Rng & Units 11/12/2019 11/10/2019 09/19/2019  Glucose 70 - 99 mg/dL 109(H) - 98  BUN 8 - 23 mg/dL 11 - 6(L)  Creatinine 0.61 - 1.24 mg/dL 0.78 0.80 0.78  Sodium 135 - 145 mmol/L 140 - 139  Potassium 3.5 - 5.1 mmol/L 4.5 - 3.8  Chloride 98 - 111 mmol/L 104 - 107  CO2 22 - 32 mmol/L 27 - 24  Calcium 8.9 - 10.3 mg/dL 9.1 - 8.7(L)  Total Protein 6.5 - 8.1 g/dL 7.1 - -  Total Bilirubin 0.3 - 1.2 mg/dL 0.2(L) - -  Alkaline Phos 38 - 126 U/L 69 - -  AST 15 - 41 U/L 12(L) - -  ALT 0 - 44 U/L 9 - -      RADIOGRAPHIC STUDIES: I have personally reviewed the radiological images as listed and agreed with the findings in the report. No results found.   ASSESSMENT & PLAN:  Brett Dawson is a 64 y.o. male with    1. Esophageal squamous cell carcinoma, in distal esophagus, cTxN1M0 -The patient's work-up began in May 2021 after presenting  with symptomatic anemia and syncope. -The patient's colonoscopy and an endoscopy was performed on 11/09/2019.  The endoscopy was significant for a large esophageal tumor which is circumferential and non-obstructive around the distal esophagus with invasion into the cardia. The path noted ulcerated, highly atypical squamous cell epithelium. -11/10/19 The patient then had a CT scan of the chest which noted a large mass in the lower third of the esophagus with ill-defined margins, measuring approximately 6.0 x 5.4 x 7.7 cm. The scan also demonstrated prominent gastrohepatic ligament lymph node adjacent to the lesser curvature. -I personally reviewed and discussed his PET scan from 11/22/19 with pt today which showed hypermetabolic low thoracic esophageal mass, and hypermetabolic gastrohepatic ligament node, no other node or  distant metastasis. -He underwent second EGD and biopsy by Dr [name] on 11/23/19. We discussed his pathology showed Invasive well-differentiated squamous cell carcinoma of esophagus which extend to stomach cardia.  -I discussed standard treatment course for locally advanced esophageal cancer which includes neoadjuvant chemo and radiation concurrently, followed by esophagectomy, and adjuvant immunotherapy. Will refer him to rad/onc and thoracic surgeon.  I recommend concurrent weekly carbo and Taxol --Chemotherapy consent: Side effects including but does not not limited to, fatigue, nausea, vomiting, diarrhea, hair loss, neuropathy, fluid retention, renal and kidney dysfunction, neutropenic fever, needed for blood transfusion, bleeding, were discussed with patient in great detail. He agrees to proceed. -The goal of therapy is curative -Plan to start chemo and radiation on August 2   2. Iron deficiency anemia secondary to blood loss -Secondary to #1 -The patient received blood transfusion on 09/27/19 and 1 dose Feraheme on 09/18/19 while in the hospital for symptomatic anemia. -Given persistently low iron (14) and Ferritin (6) and Hg (7.9) on 11/12/19 labs, he was given another Feraheme dose today    3. Weight loss and dysphagia -He initially presented with mild-moderate dysphagia. His mass is nonobstructive -The patient reports losing approximately 60 pounds over 6 months since January 2021 -He was previously referred to dietician, appointment next week.  -In the meantime, the patient was encouraged to have a soft food diet.   4. Alcohol use  -The patient has a prior history of alcohol abuse.  -He is currently drinking approximately 2-3 alcoholic beverages per week from the previous 3-4 beers daily. -I have strongly encouraged the patient to abstain from drinking alcohol.   PLAN: -PET and second the biopsy results reviewed with patient -Plan to start concurrent chemoradiation with  carboplatin and Taxol on August 2 -Cardiothoracic surgery referral -I will see him back on first day of chemo and radiation -He is scheduled to see dietitian next Monday -We will proceed second dose of Feraheme today -chemo class next week      No problem-specific Assessment & Plan notes found for this encounter.   Orders Placed This Encounter  Procedures  . Ambulatory referral to Cardiothoracic Surgery    Referral Priority:   Routine    Referral Type:   Surgical    Referral Reason:   Specialty Services Required    Requested Specialty:   Cardiothoracic Surgery    Number of Visits Requested:   1   All questions were answered. The patient knows to call the clinic with any problems, questions or concerns. No barriers to learning was detected. The total time spent in the appointment was 40 minutes.     [name], MD 11/26/2019  I, Joslyn Devon, am acting as scribe for [name], MD.   I have reviewed the  above documentation for accuracy and completeness, and I agree with the above.

## 2019-11-26 ENCOUNTER — Other Ambulatory Visit: Payer: Self-pay

## 2019-11-26 ENCOUNTER — Other Ambulatory Visit: Payer: Self-pay | Admitting: *Deleted

## 2019-11-26 ENCOUNTER — Inpatient Hospital Stay: Payer: 59

## 2019-11-26 ENCOUNTER — Inpatient Hospital Stay (HOSPITAL_BASED_OUTPATIENT_CLINIC_OR_DEPARTMENT_OTHER): Payer: 59 | Admitting: Hematology

## 2019-11-26 ENCOUNTER — Ambulatory Visit
Admission: RE | Admit: 2019-11-26 | Discharge: 2019-11-26 | Disposition: A | Discharge status: Home / Self Care | Payer: 59 | Source: Ambulatory Visit | Attending: Radiation Oncology | Admitting: Radiation Oncology

## 2019-11-26 ENCOUNTER — Telehealth: Payer: Self-pay | Admitting: Hematology

## 2019-11-26 VITALS — BP 109/59 | HR 69 | Temp 96.3°F | Resp 18 | Ht 71.0 in | Wt 208.4 lb

## 2019-11-26 VITALS — BP 108/68 | HR 69 | Temp 97.9°F | Resp 18

## 2019-11-26 DIAGNOSIS — C155 Malignant neoplasm of lower third of esophagus: Secondary | ICD-10-CM

## 2019-11-26 DIAGNOSIS — Z510 Encounter for antineoplastic radiation therapy: Secondary | ICD-10-CM | POA: Diagnosis not present

## 2019-11-26 DIAGNOSIS — D649 Anemia, unspecified: Secondary | ICD-10-CM

## 2019-11-26 MED ORDER — SODIUM CHLORIDE 0.9 % IV SOLN
510.0000 mg | Freq: Once | INTRAVENOUS | Status: AC
  Administered 2019-11-26: 510 mg via INTRAVENOUS
  Filled 2019-11-26: qty 510

## 2019-11-26 MED ORDER — SODIUM CHLORIDE 0.9 % IV SOLN
Freq: Once | INTRAVENOUS | Status: AC
  Filled 2019-11-26: qty 250

## 2019-11-26 MED ORDER — DIPHENHYDRAMINE HCL 25 MG PO CAPS
ORAL_CAPSULE | ORAL | Status: AC
  Filled 2019-11-26: qty 2

## 2019-11-26 MED ORDER — DIPHENHYDRAMINE HCL 25 MG PO CAPS
50.0000 mg | ORAL_CAPSULE | Freq: Once | ORAL | Status: AC
  Administered 2019-11-26: 50 mg via ORAL

## 2019-11-26 MED ORDER — ACETAMINOPHEN 325 MG PO TABS
ORAL_TABLET | ORAL | Status: AC
  Filled 2019-11-26: qty 2

## 2019-11-26 MED ORDER — ACETAMINOPHEN 325 MG PO TABS
650.0000 mg | ORAL_TABLET | Freq: Once | ORAL | Status: AC
  Administered 2019-11-26: 650 mg via ORAL

## 2019-11-26 NOTE — Telephone Encounter (Signed)
Pt is aware of appt on 7/29 per 7/23 los.

## 2019-11-26 NOTE — Patient Outreach (Signed)
[facility]) Care Management  11/26/2019  Brett Dawson 09/25/1955 [id number]   [facility] Telephone Assessment/Screen [employer] insurance referral  Referral Date: 11/24/19 Referral Source: [facility]  Referral Reason: esophageal tumor with invasion into cradia   Insurance: [employer] Admissions x 1 in last 6 months  11/23/19 EGD with upper endoscopy Dr [name] 11/19/19  Another Feraheme dose given 09/17/19 to 09/19/19 syncope hgb 6.7 given blood + Feraheme on 09/18/19 dx  systemic iron deficiency anemia observed and Hgb increased to 7.7 followed by [facility] gastroenterology   Outreach attempt #1 successful  Patient is able to verify HIPAA, DOB and address Reviewed and addressed referral to [facility] with patient  Confirms 60 lbs weight loss ht 5'11" Last wt 208 lbs BMI 29.01  (was 268 lbs) no Supplement at this time  He reports he continues to eat but not as much as he had prior to May 2021 He denies pain  Spoke with pcp in 2021 about gout   THN RN CM completed THN initial telephonic screening and found no care coordination needs at this time  He denies needs with transportation to medical appointments, medication needs   THN RN CM discussed THN services of THN RN CM, THN SW and [facility] plus further [facility] RN CM  follow up outreach to continue to monitor for needs and disease management  He agrees to further follow outreaches   Social: Brett Dawson is a 64 year old male who 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 He lives alone and is independent with all care He has a brother Brett Dawson who assists at intervals. His co workers are very supportive He has a daughter in [place] He states he can get to all of his medical appointments independently He denies needs with social determinants such as housing, finances etc  He was encouraged to please updated [facility] RN CM if there are changes in needs with social determinants    Conditions: Large fat containing hiatal hernia that was found to be Esophageal mass with invasion into cradia-approximately 6.0 x 5.4 x 7.7 cm after biopsies  -followed by Dr Ky Barban, hepatic and bilateral simple renal cysts, colonic diverticulosis, dysphagia,  60 lb weight loss in 6 months since January 2021, 09/17/19 syncope leading to  systemic iron deficiency anemia, history of alcohol abuse, (pt now states 2-3 beers per week was 3-4 qd)  gout  [address] with syncope on 09/17/19 with no injury He was entering a bar  DME: none  Medications: He denies concerns with taking medications as prescribed, affording medications, side effects of medications and questions about medications   Advance Directives: He denies need for assist with advance directives    Consent: THN RN CM reviewed [facility] services with patient. Patient gave verbal consent for services Brett Dawson telephonic RN CM.   Plan: [facility] RN CM will follow up with Brett Dawson within the next 2 weeks for further follow up  [facility] RN CM will send a Gaffer with [facility] brochure, Magnet, [facility] consent form with return envelope and know before you go sheet enclosed for review MD involvement barriers letter to be sent  Pt encouraged to return a call to [facility] RN CM prn Routed note to MD   [place]. Brett Hamman, RN, BSN, [place] Coordinator Office number [phone] Mobile number [phone]  Main THN number [phone] Fax number [phone]

## 2019-11-26 NOTE — Patient Instructions (Signed)

## 2019-11-27 ENCOUNTER — Encounter: Payer: Self-pay | Admitting: Hematology

## 2019-11-27 MED ORDER — PROCHLORPERAZINE MALEATE 10 MG PO TABS: 10.0000 mg | ORAL_TABLET | Freq: Four times a day (QID) | ORAL | 1 refills | Status: DC | PRN

## 2019-11-27 MED ORDER — ONDANSETRON HCL 8 MG PO TABS: 8.0000 mg | ORAL_TABLET | Freq: Two times a day (BID) | ORAL | 1 refills | Status: DC | PRN

## 2019-11-27 NOTE — Progress Notes (Signed)
START ON PATHWAY REGIMEN - Gastroesophageal     Administer weekly during RT:     Paclitaxel      Carboplatin   **Always confirm dose/schedule in your pharmacy ordering system**  Patient Characteristics: Esophageal & GE Junction, Squamous Cell, Preoperative or Nonsurgical Candidate (Clinical Staging), cT2 or Higher or cN+, Surgical Candidate (Up to cT4a) - Preoperative Therapy Histology: Squamous Cell Disease Classification: Esophageal Therapeutic Status: Preoperative or Nonsurgical Candidate (Clinical Staging) AJCC M Category: cM0 AJCC 8 Stage Grouping: Unknown AJCC Grade: G1 AJCC Location: Lower AJCC T Category: cTX AJCC N Category: cN1 Intent of Therapy: Curative Intent, Discussed with Patient

## 2019-11-29 ENCOUNTER — Inpatient Hospital Stay: Payer: 59 | Admitting: Nutrition

## 2019-11-29 ENCOUNTER — Telehealth: Payer: Self-pay | Admitting: Nutrition

## 2019-11-29 ENCOUNTER — Telehealth: Payer: Self-pay | Admitting: Hematology

## 2019-11-29 ENCOUNTER — Inpatient Hospital Stay: Payer: 59

## 2019-11-29 ENCOUNTER — Inpatient Hospital Stay: Payer: 59 | Admitting: Hematology

## 2019-11-29 NOTE — Telephone Encounter (Signed)
64 year old male diagnosed with esophageal cancer.  He is a patient of Dr. Burr Medico. Plan is for concurrent chemoradiation therapy.  Past medical history includes reflux, gout, alcohol usage, iron deficiency anemia, and obesity.  Medications include colchicine, ferosul, Zofran, and Compazine.  Labs were reviewed.  Height: 5 feet 11 inches. Weight: 214.5 pounds on July 9. Usual body weight: 270 pounds per patient (21% weight loss.) 302 pounds documented in January 2019. BMI: 29.92.  Patient reports weight loss was unintentional.  Reports he started drinking less beer during the pandemic because he was not going out to restaurants. He denies nausea and vomiting. He does have some swallowing issues and reports some foods feel like they are stuck in his throat. He has been drinking boost. Reports he has gout and tries to avoid certain foods so as not to cause a flareup.  Nutrition diagnosis:  Unintended weight loss related to esophageal cancer as evidenced by 21% weight loss from usual body weight.  Intervention: Educated patient to consume smaller more frequent meals and snacks with increased calories and protein to support weight maintenance. Reviewed strategies for improving swallowing difficulties. Brief education provided on foods to avoid secondary to gout. Encourage protein foods 6 times daily. I will mail fact sheets to patient's home address. Provided contact information.  Questions were answered.  Teach back method used.  Monitoring, evaluation, goals: Patient will tolerate adequate calories and protein to minimize weight loss throughout treatment.  Next visit: Tuesday, August 3 during infusion.  **Disclaimer: This note was dictated with voice recognition software. Similar sounding words can inadvertently be transcribed and this note may contain transcription errors which may not have been corrected upon publication of note.**

## 2019-11-29 NOTE — Telephone Encounter (Signed)
Scheduled per 7/23 los. Pt is aware of appts added. Noted to give pt updated calendar on next visit.

## 2019-11-29 NOTE — Progress Notes (Signed)
See telephone call

## 2019-11-30 ENCOUNTER — Telehealth: Payer: Self-pay | Admitting: Hematology

## 2019-11-30 NOTE — Telephone Encounter (Signed)
Rescheduled per 7/27 staff message. appts moved from 8/10 to 8/9 per YF. Noted to give pt appt calendar on next visit.

## 2019-12-01 NOTE — Progress Notes (Signed)
Patient calls for clarification on his appointment on Friday.  I explained this is with the thoracic surgeon for a consult to see whether they think he will be able to have surgery at some point for his esophageal cancer.  He verbalized an understanding.  He is also asking he we have any samples or coupons for Boost.  He has been drinking the chocolate.  I have forwarded to this Ernestene Kiel RD.

## 2019-12-02 ENCOUNTER — Other Ambulatory Visit: Payer: Self-pay

## 2019-12-02 ENCOUNTER — Other Ambulatory Visit: Payer: 59

## 2019-12-02 ENCOUNTER — Inpatient Hospital Stay: Payer: 59

## 2019-12-02 DIAGNOSIS — Z510 Encounter for antineoplastic radiation therapy: Secondary | ICD-10-CM | POA: Diagnosis not present

## 2019-12-03 ENCOUNTER — Institutional Professional Consult (permissible substitution): Payer: 59 | Admitting: Thoracic Surgery (Cardiothoracic Vascular Surgery)

## 2019-12-03 ENCOUNTER — Encounter: Payer: Self-pay | Admitting: Thoracic Surgery (Cardiothoracic Vascular Surgery)

## 2019-12-03 VITALS — BP 121/71 | HR 74 | Temp 97.7°F | Resp 20 | Ht 71.0 in | Wt 210.0 lb

## 2019-12-03 DIAGNOSIS — C155 Malignant neoplasm of lower third of esophagus: Secondary | ICD-10-CM

## 2019-12-03 NOTE — Progress Notes (Signed)
[address]       [place] [address]             [phone]                    Curtiss G [place] Medical Record #[id number] Date of Birth: 1956-01-30  Referring: [name], MD Primary Care: [name], MD Primary Cardiologist: No primary care provider on file.  Chief Complaint:    Chief Complaint  Patient presents with  . Esophageal Cancer    Surgical eval, upper GI 11/22/19, PET Scan 11/23/19    History of Present Illness:    Brett Dawson 64 y.o. male is referred by Dr. 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  He has had approximately 60 pound weight loss since January of this year which he thought was attributed to cutting down on alcohol intake.  He is also had some dysphagia, but he presented to the emergency department for evaluation and was noted to be anemic.  He underwent cross-sectional imaging which noted a large lower esophageal mass.  On his original EGD the biopsies were inconclusive, but on repeat endoscopy the pathology was consistent with squamous cell cancer.  The mass was noted at the lower esophagus with extension into the cardia of the stomach.  He is met with medical oncology, and is scheduled to undergo concurrent chemoradiation starting on August 2.  In regards to his diet he is able to tolerate most food but must eat small portions.  He occasionally has some burning with certain meals.    Smoking Hx: Non-smoker   Zubrod Score: At the time of surgery this patient's most appropriate activity status/level should be described as: '[x]'     0    Normal activity, no symptoms '[]'     1    Restricted in physical strenuous activity but ambulatory, able to do out light work '[]'     2    Ambulatory and capable of self care, unable to do work activities, up and about               >50 % of waking hours                              '[]'     3    Only limited self care, in bed greater than 50% of waking  hours '[]'     4    Completely disabled, no self care, confined to bed or chair '[]'     5    Moribund   Past Medical History:  Diagnosis Date  . Anemia   . Anemia   . Malignant neoplasm of lower third of esophagus ([place]) 11/24/2019    Past Surgical History:  Procedure Laterality Date  . BIOPSY  11/23/2019   Procedure: BIOPSY;  Surgeon: Arta Silence, MD;  Location: [facility];  Service: Endoscopy;;  . ESOPHAGOGASTRODUODENOSCOPY (EGD) WITH PROPOFOL N/A 11/23/2019   Procedure: ESOPHAGOGASTRODUODENOSCOPY (EGD) WITH PROPOFOL;  Surgeon: Arta Silence, MD;  Location: [facility];  Service: Endoscopy;  Laterality: N/A;  . TONSILLECTOMY      Family History  Problem Relation Age of Onset  . Hyperlipidemia Mother      Social History   Tobacco Use  Smoking Status Never Smoker  Smokeless Tobacco Never Used    Social History   Substance and Sexual Activity  Alcohol Use Yes  .  Alcohol/week: 40.0 standard drinks  . Types: 40 Standard drinks or equivalent per week     No Known Allergies  Current Outpatient Medications  Medication Sig Dispense Refill  . Colchicine 0.6 MG CAPS Take 1 capsule by mouth 2 (two) times daily as needed (gout).     . FEROSUL 325 (65 Fe) MG tablet Take 325 mg by mouth daily.    . ondansetron (ZOFRAN) 8 MG tablet Take 1 tablet (8 mg total) by mouth 2 (two) times daily as needed for refractory nausea / vomiting. Start on day 3 after chemo. 30 tablet 1  . pantoprazole (PROTONIX) 40 MG tablet Take 40 mg by mouth daily.    . prochlorperazine (COMPAZINE) 10 MG tablet Take 1 tablet (10 mg total) by mouth every 6 (six) hours as needed (Nausea or vomiting). 30 tablet 1   No current facility-administered medications for this visit.    Review of Systems  Constitutional: Positive for malaise/fatigue and weight loss.  Respiratory: Negative.   Cardiovascular: Positive for chest pain.  Gastrointestinal: Positive for abdominal pain, nausea and vomiting. Negative for  heartburn.  Musculoskeletal: Negative.      PHYSICAL EXAMINATION: BP 121/71   Pulse 74   Temp 97.7 F (36.5 C) (Skin)   Resp 20   Ht '5\' 11"'  (1.803 m)   Wt (!) 210 lb (95.3 kg)   SpO2 98% Comment: RA  BMI 29.29 kg/m  Physical Exam Constitutional:      Appearance: Normal appearance.  HENT:     Head: Normocephalic and atraumatic.  Eyes:     Extraocular Movements: Extraocular movements intact.     Conjunctiva/sclera: Conjunctivae normal.  Cardiovascular:     Rate and Rhythm: Normal rate.  Pulmonary:     Effort: Pulmonary effort is normal. No respiratory distress.  Abdominal:     General: Abdomen is flat. There is no distension.     Palpations: Abdomen is soft.  Musculoskeletal:        General: Normal range of motion.     Cervical back: Normal range of motion.  Skin:    General: Skin is warm and dry.  Neurological:     General: No focal deficit present.     Mental Status: He is alert and oriented to person, place, and time.     Diagnostic Studies & Laboratory data:    CT Scan: CT from May 2021 showed what was thought to originally be large hiatal hernia.  But on repeat imaging in July, this was noted to be more consistent with an esophageal mass.  It does extend up behind the heart in the lower esophagus.  Gastrohepatic ligament lymph nodes are also enlarged.  PET/CT: PET/CT from 11/22/2019 shows a 6 cm x 9 cm esophageal mass with an SUV uptake of 20.2.  The gastric cardia is also hypermetabolic.  Gastrohepatic ligament nodes are enlarged 1.1 cm in the SUV of 4.8.  EGD/EUS: Endoscopy was performed by Dr. [name] on 11/23/2019.  An EUS was not performed  A large, fungating and ulcerating mass was found in the lower third of the esophagus, beginning 34 cmfrom the incisors and extending circumferentially and confluently to the GE junction (41 cm) and further into the cardia. The mass was partially obstructing and circumferential; diagnostic endoscope could pass through tumor  with mild resistance.     Path:  A. STOMACH, CARDIA, BIOPSY:  - Invasive well-differentiated squamous cell carcinoma. See comment   B. ESOPHAGUS, DISTAL, BIOPSY:  - Invasive well-differentiated squamous cell carcinoma.  See comme      I have independently reviewed the above radiology studies  and reviewed the findings with the patient.   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  This is a very large tumor, and an EUS would have been very helpful and adequate staging.  There does not appear to be any extension into any of the mediastinal structures on cross-sectional imaging but this again is unclear.  There does appear to be some lymphadenopathy in the gastrohepatic ligament.  He is scheduled to begin neoadjuvant chemoradiation starting on August 2.  He has already had significant weight loss and that has lost 60 pounds, and will need to maintain his current weight to be an adequate surgical candidate.  I will touch base with him every 3weeks for weight checks.  But if he drops below 190 pounds he would be at high risk of complications following surgery.  Additionally, this patient will likely benefit from a post therapy EGD with EUS to ensure no mediastinal invasion given the size of his tumor.  He is scheduled to undergo 6 weeks of neoadjuvant chemo rads, and will require a repeat PET/CT 4 weeks upon completion.  And we will discuss  surgical options at that point.     I  spent 55 minutes with  the patient face to face and greater then 50% of the time was spent in counseling and coordination of care.    Brett Dawson 12/03/2019 6:07 PM

## 2019-12-06 ENCOUNTER — Other Ambulatory Visit: Payer: Self-pay

## 2019-12-06 ENCOUNTER — Other Ambulatory Visit: Payer: Self-pay | Admitting: *Deleted

## 2019-12-06 ENCOUNTER — Other Ambulatory Visit: Payer: Self-pay | Admitting: Hematology

## 2019-12-06 ENCOUNTER — Ambulatory Visit
Admission: RE | Admit: 2019-12-06 | Discharge: 2019-12-06 | Disposition: A | Discharge status: Home / Self Care | Payer: 59 | Source: Ambulatory Visit | Attending: Radiation Oncology | Admitting: Radiation Oncology

## 2019-12-06 DIAGNOSIS — C155 Malignant neoplasm of lower third of esophagus: Secondary | ICD-10-CM | POA: Diagnosis present

## 2019-12-06 DIAGNOSIS — D508 Other iron deficiency anemias: Secondary | ICD-10-CM

## 2019-12-06 DIAGNOSIS — Z79899 Other long term (current) drug therapy: Secondary | ICD-10-CM | POA: Diagnosis not present

## 2019-12-06 DIAGNOSIS — Z5111 Encounter for antineoplastic chemotherapy: Secondary | ICD-10-CM | POA: Diagnosis not present

## 2019-12-06 DIAGNOSIS — Z01812 Encounter for preprocedural laboratory examination: Secondary | ICD-10-CM | POA: Insufficient documentation

## 2019-12-06 DIAGNOSIS — F1010 Alcohol abuse, uncomplicated: Secondary | ICD-10-CM | POA: Diagnosis not present

## 2019-12-06 DIAGNOSIS — D500 Iron deficiency anemia secondary to blood loss (chronic): Secondary | ICD-10-CM | POA: Insufficient documentation

## 2019-12-06 DIAGNOSIS — Z7952 Long term (current) use of systemic steroids: Secondary | ICD-10-CM | POA: Diagnosis not present

## 2019-12-06 DIAGNOSIS — R1310 Dysphagia, unspecified: Secondary | ICD-10-CM | POA: Diagnosis not present

## 2019-12-06 DIAGNOSIS — Z510 Encounter for antineoplastic radiation therapy: Secondary | ICD-10-CM | POA: Insufficient documentation

## 2019-12-06 DIAGNOSIS — K219 Gastro-esophageal reflux disease without esophagitis: Secondary | ICD-10-CM | POA: Insufficient documentation

## 2019-12-06 DIAGNOSIS — I2510 Atherosclerotic heart disease of native coronary artery without angina pectoris: Secondary | ICD-10-CM | POA: Diagnosis not present

## 2019-12-06 DIAGNOSIS — E669 Obesity, unspecified: Secondary | ICD-10-CM | POA: Insufficient documentation

## 2019-12-06 DIAGNOSIS — Z20822 Contact with and (suspected) exposure to covid-19: Secondary | ICD-10-CM | POA: Insufficient documentation

## 2019-12-06 NOTE — Patient Outreach (Signed)
[facility]) Care Management  12/06/2019  Brett Dawson 03-13-1956 [id number]   [facility] &/29/21 Case Conference note and  THN Follow up Telephone Assessment/Screen for this [facility] referral  Brett Dawson was referred to [facility] on 11/24/19 Referral Source: [facility]  Referral Reason: esophageal tumor with invasion into cradia   Insurance: [employer] Admissions x 1 in last 6 months  11/23/19 EGD with upper endoscopy Dr Dulce Sellar 11/19/19  Another Feraheme dose given 09/17/19 to 09/19/19 syncope hgb 6.7 given blood + feraheme on 09/18/19 dx  systemic iron deficiency anemia observed and Hgb increased to 7.7 followed by [facility] gastroenterology   On 12/02/19 [facility] RN CM reviewed Brett Dawson during [facility] multidisciplinary care discussion [facility] RN CM reviewed pt's medical history and present dx of esophageal cancer with low Hemoglobin (Hg) values & Dysphagia, Feraheme doses, alcohol history (hx), weight loss from ?268 to 208 (60 lb wt loss since January 2021) Recent nutritional consult 11/29/19  and his intake of supplements discussed. Started Chocolate boost. Discussed the upcoming consult with a cardiovascular thoracic surgeon.  To start chemo/radiation on 12/06/19 Recommendations to continue to follow for pre and post op education/care (care coordination and disease management) after his visit with the cardiac thoracic surgeon  On 12/03/19 to see if he will be able to have surgery at some point for his esophageal cancer.  Outreach attempt #2  successful to his home/mobile  number after he contacted [facility] office line  Patient is able to verify HIPAA, DOB and address Reviewed and addressed referral to [facility] patient  Follow up assessment  Brett Dawson confirms his visit with the nutritionist on 11/29/19 He continues to eat soft foods, blended without issues and takes in the chocolate boost supplements He continues to work and reports no difficulty He reports his hours  have been cut back only by "five hours a week" He had weighed 302 lbs in 2019 At the beginning of 2021 he was at 26-270 lbs   He states the cardiac thoracic surgeon, Brynda Greathouse who "explained stuff, where it is located and it is not spreading to my heart" will re evaluate him after his finishes his radiation He reports he had his first chemo treatment (paclitaxel infusion, aloxi injection, carboplatin injection) today 12/06/19 and feels well  He is listed in Epic to have treatment daily in the mornings until 01/13/20 (6 weeks). He denies pain or any other worsening symptom. He states he went to work at 12 noon after this treatment "I feel fine" He reports he is still working basically full time, his company is small and his supervisors will be working with him related to his schedule and pay. He reports there is not an Human resources (HR) program . He states he will be "fine as long I can continue to work enough to pay my bills" He was encouraged to outreach to [facility] RN CM if things changes.     [facility]N RN CM followed up on possible dental needs He reports he does have some dental needs (teeth that need pulling) but he was informed by his oncology provider those needs will be care for after the treatments are completed. He reports wanting to get a partial denture.  THN RN CM discussed the importance of monitoring and reporting any bleeding, sores, pain or swelling of his teeth to his pcp or oncologist [facility] RN CM discussed minimizing risks of infection He voices understanding     [facility] RN Cm  interventions THN RN CM answered questions about stage of his esophageal cancer  It is not listed at this time it has not been documented in Epic [facility] RN CM discussed staging related to size and if it has spread  [facility] RN CM referred him to the oncologist   [facility] RN CM answered questions about his food texture as he reports burping and tasting the foods he has eaten  He has been given education on soft food eating  plan in July by a PA   Brett Dawson is to be sent EMMI materials via his listed e-mail address in [facility] manager for Chemotherapy, esophageal cancer, nausea and vomiting with cancer treatment, diet after mouth or throat surgery, soft diet   Social: Brett Dawson is a 64 year old male who 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 He lives alone and is independent with all care He has a brother [name] who assists at intervals. His co workers are very supportive He has a daughter in [place] He states he can get to all of his medical appointments independently He denies needs with social determinants such as housing, finances etc  He was encouraged to please updated [facility] RN CM if there are changes in needs with social determinants   Conditions: Large fat containing hiatal hernia that was found to be Esophageal mass with invasion into cradia-approximately 6.0 x 5.4 x 7.7 cm after biopsies  -followed by Dr Adrian Saran, hepatic and bilateral simple renal cysts, colonic diverticulosis, dysphagia,  60 lb weight loss in 6 months since January 2021, 09/17/19 syncope leading to  systemic iron deficiency anemia, history of alcohol abuse, (pt now states 2-3 beers per week was 3-4 qd)  gout  [place] with syncope on 09/17/19 with no injury He was entering a bar   Plans [facility] RN CM will follow up with Brett Dawson within the next 7-10 business days to see how he is doing with his oncology treatments, evaluate for care coordination and disease management needs Pt encouraged to return a call to [facility] RN CM prn Routed note to MD  Goals Addressed              This Visit's Progress     Patient Stated   .  Patient will be able to manage his health needs (including weight loss, fall prevention) at home related to treatments for esophageal cancer (pt-stated)        CARE PLAN ENTRY (see longitudinal plan of care for additional care plan information)  Current Barriers:  Marland Kitchen Knowledge  Deficits related to fall precautions . Knowledge Deficits related to esophageal cancer, weight loss and side effects of cancer treatment  . Cognitive Deficits  Clinical Goal(s):  Marland Kitchen Over the next 45 days, patient will not experience additional falls Last fall Sep 17 2019  . Over the next 90 days, patient will be able to maintain his weight and report any abnormal or worsening symptoms to MDs during and after cancer treatment  Interventions:   . Assessed for falls since last encounter. . Assessed patients knowledge of fall risk prevention secondary to previously provided education. . Follow up on his coping and home management after medical appointments and treatments . Assess & Encouraged good nutritional intake to include intake of Boost supplements . Assess for weight changes . Answered questions about cancer staging, food intake, food texture . Assessed for dental care and needs . Discussed the importance of monitoring and reporting  any bleeding, sores, pain or swelling of his teeth to his pcp or oncologist [facility] RN CM discussed minimizing risks of infection  . Chemotherapy, esophageal cancer, nausea and vomiting with cancer treatment, diet after mouth or throat surgery, soft diet EMMI materials sent via e-mail . Assessed for social determinants of health needs and encouraged patient to outreach if needs arise   Patient Self Care Activities:  . De-clutter walkways . Change positions slowly . Wear secure fitting shoes at all times with ambulation . Utilize home lighting for dim lit areas . Have self and pet awareness at all times . Reports any bleeding, sores, pain or swelling of his teeth to his pcp or oncologist  . Eats soft foods and take in nutritional supplements (Boost) . Maintains good oral care  . Follow up with MD about cancer staging  Plan: . CCM RN CM will follow up within the next 7-10 business days to see how he is doing with his oncology treatments, evaluate for care  coordination and disease management needs    Initial goal documentation Please see other previous [facility] care plan information listed in Epic under the flow sheet section           Miyah [place] L. Noelle Penner, RN, BSN, CCM [facility] Telephonic Care Management Care Coordinator Office number [phone] Mobile number [phone]  Main THN number [phone] Fax number [phone]

## 2019-12-06 NOTE — Progress Notes (Signed)
[place]   [phone] [phone]   Clinic Follow up Note   Patient Care Team: [name], MD as PCP - General (Family Medicine) [name], MD as Consulting Physician (Oncology) Jonnie Finner, RN as Registered Nurse Otis Brace, MD as Consulting Physician (Gastroenterology) Barbaraann Faster, RN as [place] [facility] Karie Mainland, RD as Dietitian (Nutrition) 12/07/2019  CHIEF COMPLAINT: Follow-up locally advanced esophagus cancer  SUMMARY OF ONCOLOGIC HISTORY: Oncology History Overview Note  Cancer Staging Malignant neoplasm of lower third of esophagus [facility]) Staging form: Esophagus - Squamous Cell Carcinoma, AJCC 8th Edition - Clinical stage from 11/23/2019: Stage Unknown (cTX, cN1, cM0) - Signed by [name], MD on 11/26/2019     Malignant neoplasm of lower third of esophagus ([place])  09/14/2019 Imaging   CT AP W Contrast 09/14/19 IMPRESSION: 1. Large hiatal hernia. 2. Hepatic and bilateral simple renal cysts. 3. Colonic diverticulosis. 4. Small fat containing left inguinal hernia.   11/10/2019 Imaging   CT Chest 11/10/19  IMPRESSION: 1. There is a large mass of the lower third of the esophagus, with ill-defined margins, measuring approximately 6.0 x 5.4 x 7.7 cm. This appears enlarged and more appreciably masslike than appearance on prior CT dated 09/14/2019. 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 2. There are prominent gastrohepatic ligament lymph nodes adjacent to the lesser curvature, concerning for nodal metastatic disease although not ideally imaged on this examination of the chest. 3. No evidence of metastatic disease within in the chest. 4. Coronary artery disease.   11/22/2019 PET scan   PET 11/22/19 IMPRESSION: 1. Intensely hypermetabolic (max SUV 66.0) lower thoracic esophageal 6.0 x 5.5 x 9.4 cm mass extending to the esophagogastric junction with probable involvement of  the gastric cardia, compatible with primary esophageal malignancy. 2. Hypermetabolic gastrohepatic ligament nodal metastases. 3. No hypermetabolic liver or other distant metastases. 4. Chronic findings include: Aortic Atherosclerosis (ICD10-I70.0). Coronary atherosclerosis. Marked diffuse colonic diverticulosis.   11/23/2019 Procedure   EGD with Upper Endoscopy by Dr [name] 11/23/19 IMPRESSION - Partially obstructing, likely malignant esophageal tumor was found in the lower third of the esophagus. Biopsied. 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 Biopsied. - Normal duodenal bulb, first portion of the duodenum and second portion of the duodenum.   11/23/2019 Initial Biopsy   FINAL MICROSCOPIC DIAGNOSIS:   A. STOMACH, CARDIA, BIOPSY:  - Invasive well-differentiated squamous cell carcinoma.  See comment   B. ESOPHAGUS, DISTAL, BIOPSY:  - Invasive well-differentiated squamous cell carcinoma.  See comment     COMMENT:   A  B.   Dr. Tresa Moore reviewed the case and concurs with the diagnosis.  Dr. [name] was paged on 11/24/2019.    11/23/2019 Cancer Staging   Staging form: Esophagus - Squamous Cell Carcinoma, AJCC 8th Edition - Clinical stage from 11/23/2019: Stage Unknown (cTX, cN1, cM0) - Signed by [name], MD on 11/26/2019   11/24/2019 Initial Diagnosis   Malignant neoplasm of lower third of esophagus ([place])   12/06/2019 -  Chemotherapy   The patient had dexamethasone (DECADRON) 4 MG tablet, 8 mg, Oral, Daily, 0 of 1 cycle, Start date: --, End date: -- palonosetron (ALOXI) injection 0.25 mg, 0.25 mg, Intravenous,  Once, 0 of 1 cycle CARBOplatin (PARAPLATIN) in sodium chloride 0.9 % 100 mL chemo infusion, , Intravenous,  Once, 0 of 1 cycle PACLitaxel (TAXOL) 108 mg in sodium chloride 0.9 % 250 mL chemo infusion (</= 80mg /m2), 50 mg/m2 = 108 mg, Intravenous,  Once, 0 of 1 cycle  for chemotherapy treatment.      CURRENT THERAPY: Neoadjuvant ccRT with Taxol and carboplatin,  starting 8/2  INTERVAL HISTORY: Brett Dawson returns for follow-up as scheduled.  He began radiation on 12/06/2019.  He is scheduled to start chemo today.  He is doing well, energy and appetite are normal.  He is more or less able to eat a normal diet including salads and meat he has to chew heavily and take small portions.  He has mild dysphagia and will need to cough, no odynophagia.  Denies any pain.  He takes iron once daily, has constipation and dark stools, denies nausea, vomiting, diarrhea, blood in stool.  He has significantly cut back on alcohol but still drinks beer occasionally.  Denies recent fever, chills, chest pain, dyspnea.  He has occasional cough with white phlegm.  At night his hands fall asleep. Feet swell periodically, wears compression socks; otherwise denies neuropathy.    MEDICAL HISTORY:  Past Medical History:  Diagnosis Date  . Anemia   . Anemia   . Malignant neoplasm of lower third of esophagus ([place]) 11/24/2019    SURGICAL HISTORY: Past Surgical History:  Procedure Laterality Date  . BIOPSY  11/23/2019   Procedure: BIOPSY;  Surgeon: Arta Silence, MD;  Location: [facility];  Service: Endoscopy;;  . ESOPHAGOGASTRODUODENOSCOPY (EGD) WITH PROPOFOL N/A 11/23/2019   Procedure: ESOPHAGOGASTRODUODENOSCOPY (EGD) WITH PROPOFOL;  Surgeon: Arta Silence, MD;  Location: [facility];  Service: Endoscopy;  Laterality: N/A;  . TONSILLECTOMY      I have reviewed the social history and family history with the patient and they are unchanged from previous note.  ALLERGIES:  has No Known Allergies.  MEDICATIONS:  Current Outpatient Medications  Medication Sig Dispense Refill  . Colchicine 0.6 MG CAPS Take 1 capsule by mouth 2 (two) times daily as needed (gout).     . FEROSUL 325 (65 Fe) MG tablet Take 325 mg by mouth daily.    . ondansetron (ZOFRAN) 8 MG tablet Take 1 tablet (8 mg total) by mouth 2 (two) times daily as needed for refractory nausea / vomiting. Start on day 3  after chemo. 30 tablet 1  . pantoprazole (PROTONIX) 40 MG tablet Take 40 mg by mouth daily.    . prochlorperazine (COMPAZINE) 10 MG tablet Take 1 tablet (10 mg total) by mouth every 6 (six) hours as needed (Nausea or vomiting). 30 tablet 1   No current facility-administered medications for this visit.    PHYSICAL EXAMINATION: ECOG PERFORMANCE STATUS: 0 - Asymptomatic  Vitals:   12/07/19 0823  BP: 109/65  Pulse: 65  Resp: 18  Temp: 97.8 F (36.6 C)  SpO2: 100%   Filed Weights   12/07/19 0823  Weight: 208 lb 3.2 oz (94.4 kg)    GENERAL:alert, no distress and comfortable SKIN: No rash to exposed skin EYES:  sclera clear LUNGS: clear with normal breathing effort HEART: regular rate & rhythm, mild bilateral lower extremity edema NEURO: alert & oriented x 3 with fluent speech  LABORATORY DATA:  I have reviewed the data as listed CBC Latest Ref Rng & Units 12/07/2019 11/12/2019 09/19/2019  WBC 4.0 - 10.5 K/uL 8.2 8.1 -  Hemoglobin 13.0 - 17.0 g/dL 9.6(L) 7.9(L) 7.7(L)  Hematocrit 39 - 52 % 31.2(L) 26.6(L) 25.9(L)  Platelets 150 - 400 K/uL 318 366 -     CMP Latest Ref Rng & Units 12/07/2019 11/12/2019 11/10/2019  Glucose 70 - 99 mg/dL 136(H) 109(H) -  BUN  8 - 23 mg/dL 12 11 -  Creatinine 0.61 - 1.24 mg/dL 0.71 0.78 0.80  Sodium 135 - 145 mmol/L 137 140 -  Potassium 3.5 - 5.1 mmol/L 3.8 4.5 -  Chloride 98 - 111 mmol/L 104 104 -  CO2 22 - 32 mmol/L 24 27 -  Calcium 8.9 - 10.3 mg/dL 9.8 9.1 -  Total Protein 6.5 - 8.1 g/dL 7.1 7.1 -  Total Bilirubin 0.3 - 1.2 mg/dL 0.2(L) 0.2(L) -  Alkaline Phos 38 - 126 U/L 62 69 -  AST 15 - 41 U/L 12(L) 12(L) -  ALT 0 - 44 U/L 9 9 -      RADIOGRAPHIC STUDIES: I have personally reviewed the radiological images as listed and agreed with the findings in the report. No results found.   ASSESSMENT & PLAN: Brett Dawson is a 64 y.o. male with    1. Esophageal squamous cell carcinoma, in distal esophagus, cTxN1M0 -He presented with  symptomatic anemia and syncope in 09/2019 and 60 pound weight loss since 05/2019.  Endoscopy on 11/09/2019 significant for a large distal esophageal tumor with invasion into the cardia. The path noted ulcerated, highly atypical squamous cell epithelium. -11/10/19 CT noted a large mass in the lower third of the esophagus measuring approximately 6.0 x 5.4 x 7.7 cm with prominent gastrohepatic ligament lymph node adjacent to the lesser curvature. -11/22/2019 PET showed hypermetabolic low thoracic esophageal mass with probable involvement in the gastric cardia, and hypermetabolic gastrohepatic ligament node, no other node or distant metastasis. -He underwent second EGD and biopsy by Dr [name] on 11/23/19. Pathology showed Invasive well-differentiated squamous cell carcinoma of esophagus which extend to stomach cardia.  -He was seen by cardiothoracic surgeon Dr. Kipp Brood on 12/03/2019 who feels this is potentially resectable in the event he is able to maintain his weight (goal >190) during ccRT.  He will require post therapy EGD with EUS to ensure no mediastinal invasion given the size of his tumor and repeat PET/CT 4 weeks after completing neoadjuvant treatment at which time he will discuss surgical options with the patient. -For his locally advanced esophageal squamous cell carcinoma, he began neoadjuvant concurrent chemoradiation with Taxol/carbo with curative intent on 8/2 (chemo starting 12/07/19)  2. Iron deficiency anemia secondary to blood loss -Secondary to #1 -S/p blood transfusion on 09/27/19 and IV Feraheme on 09/18/19 while in the hospital for symptomatic anemia, then again on 7/16 and 11/26/2019. -Hgb improved to 9.6 today, ferritin is pending   3. Weight loss and dysphagia -He initially presented with mild-moderate dysphagia. His mass is nonobstructive -The patient reports losing approximately 60 pounds over 6 months since January 2021 -Followed by dietitian -Weight stable on a normal diet, mild  dysphagia without odynophagia  4. Alcohol use  -The patient has a prior history of alcohol abuse.  -He has cut back significantly, again encouraged to stop altogether  Disposition: Mr. Moehring appears stable.  He is able to maintain a stable weight on a normal diet with mild dysphagia, no odynophagia.  He began radiation on 12/06/2019.  We reviewed potential side effects and symptom management with chemo.  CBC and CMP are stable, adequate to begin day 1 Taxol and carboplatin today. Will follow up on the ferritin from today.  Follow-up weekly with chemo.   All questions were answered. The patient knows to call the clinic with any problems, questions or concerns. No barriers to learning were detected.     Alla Feeling, NP 12/07/19

## 2019-12-07 ENCOUNTER — Inpatient Hospital Stay: Payer: 59

## 2019-12-07 ENCOUNTER — Ambulatory Visit: Payer: 59

## 2019-12-07 ENCOUNTER — Encounter: Payer: Self-pay | Admitting: Hematology

## 2019-12-07 ENCOUNTER — Inpatient Hospital Stay: Payer: 59 | Attending: Physician Assistant | Admitting: Nurse Practitioner

## 2019-12-07 ENCOUNTER — Ambulatory Visit
Admission: RE | Admit: 2019-12-07 | Discharge: 2019-12-07 | Disposition: A | Discharge status: Home / Self Care | Payer: 59 | Source: Ambulatory Visit | Attending: Radiation Oncology | Admitting: Radiation Oncology

## 2019-12-07 ENCOUNTER — Telehealth: Payer: Self-pay

## 2019-12-07 ENCOUNTER — Other Ambulatory Visit: Payer: Self-pay

## 2019-12-07 ENCOUNTER — Encounter: Payer: Self-pay | Admitting: Nurse Practitioner

## 2019-12-07 ENCOUNTER — Inpatient Hospital Stay: Payer: 59 | Admitting: Nutrition

## 2019-12-07 VITALS — BP 106/65 | HR 58 | Temp 98.2°F | Resp 18

## 2019-12-07 VITALS — BP 109/65 | HR 65 | Temp 97.8°F | Resp 18 | Ht 71.0 in | Wt 208.2 lb

## 2019-12-07 DIAGNOSIS — D508 Other iron deficiency anemias: Secondary | ICD-10-CM

## 2019-12-07 DIAGNOSIS — I2510 Atherosclerotic heart disease of native coronary artery without angina pectoris: Secondary | ICD-10-CM | POA: Insufficient documentation

## 2019-12-07 DIAGNOSIS — D500 Iron deficiency anemia secondary to blood loss (chronic): Secondary | ICD-10-CM | POA: Insufficient documentation

## 2019-12-07 DIAGNOSIS — R1310 Dysphagia, unspecified: Secondary | ICD-10-CM | POA: Insufficient documentation

## 2019-12-07 DIAGNOSIS — F1010 Alcohol abuse, uncomplicated: Secondary | ICD-10-CM | POA: Insufficient documentation

## 2019-12-07 DIAGNOSIS — Z5111 Encounter for antineoplastic chemotherapy: Secondary | ICD-10-CM | POA: Diagnosis not present

## 2019-12-07 DIAGNOSIS — C155 Malignant neoplasm of lower third of esophagus: Secondary | ICD-10-CM

## 2019-12-07 DIAGNOSIS — Z7952 Long term (current) use of systemic steroids: Secondary | ICD-10-CM | POA: Insufficient documentation

## 2019-12-07 DIAGNOSIS — Z79899 Other long term (current) drug therapy: Secondary | ICD-10-CM | POA: Insufficient documentation

## 2019-12-07 LAB — CBC WITH DIFFERENTIAL (CANCER CENTER ONLY)
Abs Immature Granulocytes: 0.04 10*3/uL (ref 0.00–0.07)
Basophils Absolute: 0 10*3/uL (ref 0.0–0.1)
Basophils Relative: 0 %
Eosinophils Absolute: 0.1 10*3/uL (ref 0.0–0.5)
Eosinophils Relative: 2 %
HCT: 31.2 % — ABNORMAL LOW (ref 39.0–52.0)
Hemoglobin: 9.6 g/dL — ABNORMAL LOW (ref 13.0–17.0)
Immature Granulocytes: 1 %
Lymphocytes Relative: 9 %
Lymphs Abs: 0.7 10*3/uL (ref 0.7–4.0)
MCH: 24.5 pg — ABNORMAL LOW (ref 26.0–34.0)
MCHC: 30.8 g/dL (ref 30.0–36.0)
MCV: 79.6 fL — ABNORMAL LOW (ref 80.0–100.0)
Monocytes Absolute: 0.5 10*3/uL (ref 0.1–1.0)
Monocytes Relative: 6 %
Neutro Abs: 6.8 10*3/uL (ref 1.7–7.7)
Neutrophils Relative %: 82 %
Platelet Count: 318 10*3/uL (ref 150–400)
RBC: 3.92 MIL/uL — ABNORMAL LOW (ref 4.22–5.81)
RDW: 21.7 % — ABNORMAL HIGH (ref 11.5–15.5)
WBC Count: 8.2 10*3/uL (ref 4.0–10.5)
nRBC: 0 % (ref 0.0–0.2)

## 2019-12-07 LAB — CMP (CANCER CENTER ONLY)
ALT: 9 U/L (ref 0–44)
AST: 12 U/L — ABNORMAL LOW (ref 15–41)
Albumin: 3.3 g/dL — ABNORMAL LOW (ref 3.5–5.0)
Alkaline Phosphatase: 62 U/L (ref 38–126)
Anion gap: 9 (ref 5–15)
BUN: 12 mg/dL (ref 8–23)
CO2: 24 mmol/L (ref 22–32)
Calcium: 9.8 mg/dL (ref 8.9–10.3)
Chloride: 104 mmol/L (ref 98–111)
Creatinine: 0.71 mg/dL (ref 0.61–1.24)
GFR, Est AFR Am: >60 mL/min (ref >60)
GFR, Estimated: >60 mL/min (ref >60)
Glucose, Bld: 136 mg/dL — ABNORMAL HIGH (ref 70–99)
Potassium: 3.8 mmol/L (ref 3.5–5.1)
Sodium: 137 mmol/L (ref 135–145)
Total Bilirubin: 0.2 mg/dL — ABNORMAL LOW (ref 0.3–1.2)
Total Protein: 7.1 g/dL (ref 6.5–8.1)

## 2019-12-07 LAB — FERRITIN: Ferritin: 183 ng/mL (ref 24–336)

## 2019-12-07 MED ORDER — PALONOSETRON HCL INJECTION 0.25 MG/5ML
INTRAVENOUS | Status: AC
  Filled 2019-12-07: qty 5

## 2019-12-07 MED ORDER — DIPHENHYDRAMINE HCL 50 MG/ML IJ SOLN
INTRAMUSCULAR | Status: AC
  Filled 2019-12-07: qty 1

## 2019-12-07 MED ORDER — SODIUM CHLORIDE 0.9 % IV SOLN
20.0000 mg | Freq: Once | INTRAVENOUS | Status: AC
  Administered 2019-12-07: 20 mg via INTRAVENOUS
  Filled 2019-12-07: qty 20

## 2019-12-07 MED ORDER — SODIUM CHLORIDE 0.9 % IV SOLN
300.0000 mg | Freq: Once | INTRAVENOUS | Status: AC
  Administered 2019-12-07: 300 mg via INTRAVENOUS
  Filled 2019-12-07: qty 30

## 2019-12-07 MED ORDER — SODIUM CHLORIDE 0.9 % IV SOLN
Freq: Once | INTRAVENOUS | Status: AC
  Filled 2019-12-07: qty 250

## 2019-12-07 MED ORDER — DIPHENHYDRAMINE HCL 50 MG/ML IJ SOLN
25.0000 mg | Freq: Once | INTRAMUSCULAR | Status: AC
  Administered 2019-12-07: 25 mg via INTRAVENOUS

## 2019-12-07 MED ORDER — SODIUM CHLORIDE 0.9 % IV SOLN
50.0000 mg/m2 | Freq: Once | INTRAVENOUS | Status: AC
  Administered 2019-12-07: 108 mg via INTRAVENOUS
  Filled 2019-12-07: qty 18

## 2019-12-07 MED ORDER — FAMOTIDINE IN NACL 20-0.9 MG/50ML-% IV SOLN
20.0000 mg | Freq: Once | INTRAVENOUS | Status: AC
  Administered 2019-12-07: 20 mg via INTRAVENOUS

## 2019-12-07 MED ORDER — FAMOTIDINE IN NACL 20-0.9 MG/50ML-% IV SOLN
INTRAVENOUS | Status: AC
  Filled 2019-12-07: qty 50

## 2019-12-07 MED ORDER — PALONOSETRON HCL INJECTION 0.25 MG/5ML
0.2500 mg | Freq: Once | INTRAVENOUS | Status: AC
  Administered 2019-12-07: 0.25 mg via INTRAVENOUS

## 2019-12-07 NOTE — Progress Notes (Signed)
Patient requested speaking with someone regarding financial assistance. Gave card to registration staff with my contact information for patient to call to discuss.   Went to lobby area to call for patient to formally introduce myself and discuss [employer], no answer. Patient has several appointments today. Will wait for call from patient to avoid delays.

## 2019-12-07 NOTE — Progress Notes (Signed)
Nutrition follow-up completed with patient during infusion for esophageal cancer. Today is patient's first chemotherapy.  Radiation therapy started yesterday. Weight is relatively stable and was documented as 208.2 pounds down from 210 pounds July 20 but down from 214.5 pounds July 9. Patient reports appetite is normal. He takes small bites and chews his food well. He is drinking boost twice daily. Denies nutrition impact symptoms.  Nutrition diagnosis: Unintended weight loss continues.  Intervention: Educated patient to continue strategies for small frequent meals and snacks consisting of high-calorie, high-protein food. Encouraged him to take small bites and chew food well. Continue boost twice daily. Questions were answered.  Teach back method used.  Monitoring, evaluation, goals: Patient will tolerate increased calories and protein for weight stability.  Next visit: Monday, August 16 during infusion.  Patient has my contact information if he develops nutrition questions or concerns prior to next appointment.  **Disclaimer: This note was dictated with voice recognition software. Similar sounding words can inadvertently be transcribed and this note may contain transcription errors which may not have been corrected upon publication of note.**

## 2019-12-07 NOTE — Telephone Encounter (Signed)
I called and spoke with Mr. Brett Dawson and notified him that his ferritin responded well to IV iron, No need for additional iron infusions for now. I also notified Mr. Brett Dawson that we will continue to monitor him closely and continue oral iron as tolerated/ Mr. Brett Dawson verbalized understanding.

## 2019-12-07 NOTE — Patient Instructions (Signed)
Paclitaxel injection What is this medicine? PACLITAXEL (PAK li TAX el) is a chemotherapy drug. It targets fast dividing cells, like cancer cells, and causes these cells to die. This medicine is used to treat ovarian cancer, breast cancer, lung cancer, Kaposi's sarcoma, and other cancers. This medicine may be used for other purposes; ask your health care provider or pharmacist if you have questions. COMMON BRAND NAME(S): Onxol, Taxol What should I tell my health care provider before I take this medicine? They need to know if you have any of these conditions:  history of irregular heartbeat  liver disease  low blood counts, like low white cell, platelet, or red cell counts  lung or breathing disease, like asthma  tingling of the fingers or toes, or other nerve disorder  an unusual or allergic reaction to paclitaxel, alcohol, polyoxyethylated castor oil, other chemotherapy, other medicines, foods, dyes, or preservatives  pregnant or trying to get pregnant  breast-feeding How should I use this medicine? This drug is given as an infusion into a vein. It is administered in a hospital or clinic by a specially trained health care professional. Talk to your pediatrician regarding the use of this medicine in children. Special care may be needed. Overdosage: If you think you have taken too much of this medicine contact a poison control center or emergency room at once. NOTE: This medicine is only for you. Do not share this medicine with others. What if I miss a dose? It is important not to miss your dose. Call your doctor or health care professional if you are unable to keep an appointment. What may interact with this medicine? Do not take this medicine with any of the following medications:  disulfiram  metronidazole This medicine may also interact with the following medications:  antiviral medicines for hepatitis, HIV or AIDS  certain antibiotics like erythromycin and  clarithromycin  certain medicines for fungal infections like ketoconazole and itraconazole  certain medicines for seizures like carbamazepine, phenobarbital, phenytoin  gemfibrozil  nefazodone  rifampin  St. John's wort This list may not describe all possible interactions. Give your health care provider a list of all the medicines, herbs, non-prescription drugs, or dietary supplements you use. Also tell them if you smoke, drink alcohol, or use illegal drugs. Some items may interact with your medicine. What should I watch for while using this medicine? Your condition will be monitored carefully while you are receiving this medicine. You will need important blood work done while you are taking this medicine. This medicine can cause serious allergic reactions. To reduce your risk you will need to take other medicine(s) before treatment with this medicine. If you experience allergic reactions like skin rash, itching or hives, swelling of the face, lips, or tongue, tell your doctor or health care professional right away. In some cases, you may be given additional medicines to help with side effects. Follow all directions for their use. This drug may make you feel generally unwell. This is not uncommon, as chemotherapy can affect healthy cells as well as cancer cells. Report any side effects. Continue your course of treatment even though you feel ill unless your doctor tells you to stop. Call your doctor or health care professional for advice if you get a fever, chills or sore throat, or other symptoms of a cold or flu. Do not treat yourself. This drug decreases your body's ability to fight infections. Try to avoid being around people who are sick. This medicine may increase your risk to bruise   or bleed. Call your doctor or health care professional if you notice any unusual bleeding. Be careful brushing and flossing your teeth or using a toothpick because you may get an infection or bleed more easily.  If you have any dental work done, tell your dentist you are receiving this medicine. Avoid taking products that contain aspirin, acetaminophen, ibuprofen, naproxen, or ketoprofen unless instructed by your doctor. These medicines may hide a fever. Do not become pregnant while taking this medicine. Women should inform their doctor if they wish to become pregnant or think they might be pregnant. There is a potential for serious side effects to an unborn child. Talk to your health care professional or pharmacist for more information. Do not breast-feed an infant while taking this medicine. Men are advised not to father a child while receiving this medicine. This product may contain alcohol. Ask your pharmacist or healthcare provider if this medicine contains alcohol. Be sure to tell all healthcare providers you are taking this medicine. Certain medicines, like metronidazole and disulfiram, can cause an unpleasant reaction when taken with alcohol. The reaction includes flushing, headache, nausea, vomiting, sweating, and increased thirst. The reaction can last from 30 minutes to several hours. What side effects may I notice from receiving this medicine? Side effects that you should report to your doctor or health care professional as soon as possible:  allergic reactions like skin rash, itching or hives, swelling of the face, lips, or tongue  breathing problems  changes in vision  fast, irregular heartbeat  high or low blood pressure  mouth sores  pain, tingling, numbness in the hands or feet  signs of decreased platelets or bleeding - bruising, pinpoint red spots on the skin, black, tarry stools, blood in the urine  signs of decreased red blood cells - unusually weak or tired, feeling faint or lightheaded, falls  signs of infection - fever or chills, cough, sore throat, pain or difficulty passing urine  signs and symptoms of liver injury like dark yellow or brown urine; general ill feeling or  flu-like symptoms; light-colored stools; loss of appetite; nausea; right upper belly pain; unusually weak or tired; yellowing of the eyes or skin  swelling of the ankles, feet, hands  unusually slow heartbeat Side effects that usually do not require medical attention (report to your doctor or health care professional if they continue or are bothersome):  diarrhea  hair loss  loss of appetite  muscle or joint pain  nausea, vomiting  pain, redness, or irritation at site where injected  tiredness This list may not describe all possible side effects. Call your doctor for medical advice about side effects. You may report side effects to FDA at 1-800-FDA-1088. Where should I keep my medicine? This drug is given in a hospital or clinic and will not be stored at home. NOTE: This sheet is a summary. It may not cover all possible information. If you have questions about this medicine, talk to your doctor, pharmacist, or health care provider.  2020 Elsevier/Gold Standard (2016-12-24 13:14:55) Carboplatin injection What is this medicine? CARBOPLATIN (KAR boe pla tin) is a chemotherapy drug. It targets fast dividing cells, like cancer cells, and causes these cells to die. This medicine is used to treat ovarian cancer and many other cancers. This medicine may be used for other purposes; ask your health care provider or pharmacist if you have questions. COMMON BRAND NAME(S): Paraplatin What should I tell my health care provider before I take this medicine? They need to   know if you have any of these conditions:  blood disorders  hearing problems  kidney disease  recent or ongoing radiation therapy  an unusual or allergic reaction to carboplatin, cisplatin, other chemotherapy, other medicines, foods, dyes, or preservatives  pregnant or trying to get pregnant  breast-feeding How should I use this medicine? This drug is usually given as an infusion into a vein. It is administered in a  hospital or clinic by a specially trained health care professional. Talk to your pediatrician regarding the use of this medicine in children. Special care may be needed. Overdosage: If you think you have taken too much of this medicine contact a poison control center or emergency room at once. NOTE: This medicine is only for you. Do not share this medicine with others. What if I miss a dose? It is important not to miss a dose. Call your doctor or health care professional if you are unable to keep an appointment. What may interact with this medicine?  medicines for seizures  medicines to increase blood counts like filgrastim, pegfilgrastim, sargramostim  some antibiotics like amikacin, gentamicin, neomycin, streptomycin, tobramycin  vaccines Talk to your doctor or health care professional before taking any of these medicines:  acetaminophen  aspirin  ibuprofen  ketoprofen  naproxen This list may not describe all possible interactions. Give your health care provider a list of all the medicines, herbs, non-prescription drugs, or dietary supplements you use. Also tell them if you smoke, drink alcohol, or use illegal drugs. Some items may interact with your medicine. What should I watch for while using this medicine? Your condition will be monitored carefully while you are receiving this medicine. You will need important blood work done while you are taking this medicine. This drug may make you feel generally unwell. This is not uncommon, as chemotherapy can affect healthy cells as well as cancer cells. Report any side effects. Continue your course of treatment even though you feel ill unless your doctor tells you to stop. In some cases, you may be given additional medicines to help with side effects. Follow all directions for their use. Call your doctor or health care professional for advice if you get a fever, chills or sore throat, or other symptoms of a cold or flu. Do not treat  yourself. This drug decreases your body's ability to fight infections. Try to avoid being around people who are sick. This medicine may increase your risk to bruise or bleed. Call your doctor or health care professional if you notice any unusual bleeding. Be careful brushing and flossing your teeth or using a toothpick because you may get an infection or bleed more easily. If you have any dental work done, tell your dentist you are receiving this medicine. Avoid taking products that contain aspirin, acetaminophen, ibuprofen, naproxen, or ketoprofen unless instructed by your doctor. These medicines may hide a fever. Do not become pregnant while taking this medicine. Women should inform their doctor if they wish to become pregnant or think they might be pregnant. There is a potential for serious side effects to an unborn child. Talk to your health care professional or pharmacist for more information. Do not breast-feed an infant while taking this medicine. What side effects may I notice from receiving this medicine? Side effects that you should report to your doctor or health care professional as soon as possible:  allergic reactions like skin rash, itching or hives, swelling of the face, lips, or tongue  signs of infection - fever or   chills, cough, sore throat, pain or difficulty passing urine  signs of decreased platelets or bleeding - bruising, pinpoint red spots on the skin, black, tarry stools, nosebleeds  signs of decreased red blood cells - unusually weak or tired, fainting spells, lightheadedness  breathing problems  changes in hearing  changes in vision  chest pain  high blood pressure  low blood counts - This drug may decrease the number of white blood cells, red blood cells and platelets. You may be at increased risk for infections and bleeding.  nausea and vomiting  pain, swelling, redness or irritation at the injection site  pain, tingling, numbness in the hands or  feet  problems with balance, talking, walking  trouble passing urine or change in the amount of urine Side effects that usually do not require medical attention (report to your doctor or health care professional if they continue or are bothersome):  hair loss  loss of appetite  metallic taste in the mouth or changes in taste This list may not describe all possible side effects. Call your doctor for medical advice about side effects. You may report side effects to FDA at 1-800-FDA-1088. Where should I keep my medicine? This drug is given in a hospital or clinic and will not be stored at home. NOTE: This sheet is a summary. It may not cover all possible information. If you have questions about this medicine, talk to your doctor, pharmacist, or health care provider.  2020 Elsevier/Gold Standard (2007-07-28 14:38:05)  

## 2019-12-07 NOTE — Telephone Encounter (Signed)
-----   Message from Alla Feeling, NP sent at 12/07/2019  3:44 PM EDT ----- Please let him know ferritin responded well to IV iron, no need for additional iron infusions for now, will monitor closely. Continues oral iron as tolerated.  Thanks, Regan Rakers

## 2019-12-08 ENCOUNTER — Ambulatory Visit
Admission: RE | Admit: 2019-12-08 | Discharge: 2019-12-08 | Disposition: A | Discharge status: Home / Self Care | Payer: 59 | Source: Ambulatory Visit | Attending: Radiation Oncology | Admitting: Radiation Oncology

## 2019-12-08 ENCOUNTER — Other Ambulatory Visit: Payer: Self-pay

## 2019-12-08 DIAGNOSIS — Z5111 Encounter for antineoplastic chemotherapy: Secondary | ICD-10-CM | POA: Diagnosis not present

## 2019-12-08 NOTE — Progress Notes (Signed)
Brett Dawson   [phone] Fax:(336) [phone]   Clinic Follow up Note   Patient Care Team: [name], MD as PCP - General (Family Medicine) [name], MD as Consulting Physician (Oncology) Jonnie Finner, RN as Registered Nurse Otis Brace, MD as Consulting Physician (Gastroenterology) Barbaraann Faster, RN as [facility] Karie Mainland, RD as Dietitian (Nutrition) Lajuana Matte, MD as Consulting Physician (Cardiothoracic Surgery)  Date of Service:  12/13/2019  CHIEF COMPLAINT: F/u of Esophoageal cancer  SUMMARY OF ONCOLOGIC HISTORY: Oncology History Overview Note  Cancer Staging Malignant neoplasm of lower third of esophagus [facility]) Staging form: Esophagus - Squamous Cell Carcinoma, AJCC 8th Edition - Clinical stage from 11/23/2019: Stage Unknown (cTX, cN1, cM0) - Signed by [name], MD on 11/26/2019     Malignant neoplasm of lower third of esophagus ([place])  09/14/2019 Imaging   CT AP W Contrast 09/14/19 IMPRESSION: 1. Large hiatal hernia. 2. Hepatic and bilateral simple renal cysts. 3. Colonic diverticulosis. 4. Small fat containing left inguinal hernia.   11/10/2019 Imaging   CT Chest 11/10/19  IMPRESSION: 1. There is a large mass of the lower third of the esophagus, with ill-defined margins, measuring approximately 6.0 x 5.4 x 7.7 cm. This appears enlarged and more appreciably masslike than appearance on prior CT dated 09/14/2019. 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 2. There are prominent gastrohepatic ligament lymph nodes adjacent to the lesser curvature, concerning for nodal metastatic disease although not ideally imaged on this examination of the chest. 3. No evidence of metastatic disease within in the chest. 4. Coronary artery disease.   11/22/2019 PET scan   PET 11/22/19 IMPRESSION: 1. Intensely hypermetabolic (max SUV 16.0) lower thoracic esophageal 6.0 x 5.5 x 9.4 cm  mass extending to the esophagogastric junction with probable involvement of the gastric cardia, compatible with primary esophageal malignancy. 2. Hypermetabolic gastrohepatic ligament nodal metastases. 3. No hypermetabolic liver or other distant metastases. 4. Chronic findings include: Aortic Atherosclerosis (ICD10-I70.0). Coronary atherosclerosis. Marked diffuse colonic diverticulosis.   11/23/2019 Procedure   EGD with Upper Endoscopy by Dr [name] 11/23/19 IMPRESSION - Partially obstructing, likely malignant esophageal tumor was found in the lower third of the esophagus. Biopsied. 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 Biopsied. - Normal duodenal bulb, first portion of the duodenum and second portion of the duodenum.   11/23/2019 Initial Biopsy   FINAL MICROSCOPIC DIAGNOSIS:   A. STOMACH, CARDIA, BIOPSY:  - Invasive well-differentiated squamous cell carcinoma.  See comment   B. ESOPHAGUS, DISTAL, BIOPSY:  - Invasive well-differentiated squamous cell carcinoma.  See comment     COMMENT:   A  B.   Dr. Tresa Moore reviewed the case and concurs with the diagnosis.  Dr. [name] was paged on 11/24/2019.    11/23/2019 Cancer Staging   Staging form: Esophagus - Squamous Cell Carcinoma, AJCC 8th Edition - Clinical stage from 11/23/2019: Stage Unknown (cTX, cN1, cM0) - Signed by [name], MD on 11/26/2019   11/24/2019 Initial Diagnosis   Malignant neoplasm of lower third of esophagus ([place])   12/07/2019 -  Chemotherapy   Concurrent chemoradiation with weekly carboplatin and Taxol starting 12/07/19   12/07/2019 -  Radiation Therapy   Concurrent chemoradiation with Dr Lisbeth Renshaw starting 12/07/19      CURRENT THERAPY:  Concurrent chemoradiation with weekly carboplatin and Taxol starting 12/07/19  INTERVAL HISTORY:  Brett Dawson is here for a follow up and treatment. He presents to the clinic alone.  Doing well overall he tolerated first week treatment well last week, had a mild nausea last  Friday, resolved after taking Compazine.  His appetite is good, he is on soft diet.  He also drinks boost 2 bottles a day.  He has lost 7 pounds since last week, but some of the weight change was related to his clothing per patient.  No fever, chills, pain, or other new complaints.  Energy level is decent.  Review of system otherwise negative  MEDICAL HISTORY:  Past Medical History:  Diagnosis Date  . Anemia   . Anemia   . Malignant neoplasm of lower third of esophagus ([place]) 11/24/2019    SURGICAL HISTORY: Past Surgical History:  Procedure Laterality Date  . BIOPSY  11/23/2019   Procedure: BIOPSY;  Surgeon: Arta Silence, MD;  Location: [facility];  Service: Endoscopy;;  . ESOPHAGOGASTRODUODENOSCOPY (EGD) WITH PROPOFOL N/A 11/23/2019   Procedure: ESOPHAGOGASTRODUODENOSCOPY (EGD) WITH PROPOFOL;  Surgeon: Arta Silence, MD;  Location: [facility];  Service: Endoscopy;  Laterality: N/A;  . TONSILLECTOMY      I have reviewed the social history and family history with the patient and they are unchanged from previous note.  ALLERGIES:  has No Known Allergies.  MEDICATIONS:  Current Outpatient Medications  Medication Sig Dispense Refill  . Colchicine 0.6 MG CAPS Take 1 capsule by mouth 2 (two) times daily as needed (gout).     . FEROSUL 325 (65 Fe) MG tablet Take 325 mg by mouth daily.    . ondansetron (ZOFRAN) 8 MG tablet Take 1 tablet (8 mg total) by mouth 2 (two) times daily as needed for refractory nausea / vomiting. Start on day 3 after chemo. 30 tablet 1  . pantoprazole (PROTONIX) 40 MG tablet Take 40 mg by mouth daily.    . prochlorperazine (COMPAZINE) 10 MG tablet Take 1 tablet (10 mg total) by mouth every 6 (six) hours as needed (Nausea or vomiting). 30 tablet 1   No current facility-administered medications for this visit.    PHYSICAL EXAMINATION: ECOG PERFORMANCE STATUS: 1 - Symptomatic but completely ambulatory  Vitals:   12/13/19 0812  BP: 103/70  Pulse: 66  Resp:  18  Temp: (!) 96.7 F (35.9 C)  SpO2: 100%   Filed Weights   12/13/19 0812  Weight: 201 lb 8 oz (91.4 kg)    GENERAL:alert, no distress and comfortable SKIN: skin color, texture, turgor are normal, no rashes or significant lesions EYES: normal, Conjunctiva are pink and non-injected, sclera clear NECK: supple, thyroid normal size, non-tender, without nodularity LYMPH:  no palpable lymphadenopathy in the cervical, axillary  LUNGS: clear to auscultation and percussion with normal breathing effort HEART: regular rate & rhythm and no murmurs and no lower extremity edema ABDOMEN:abdomen soft, non-tender and normal bowel sounds Musculoskeletal:no cyanosis of digits and no clubbing, mild bilateral lower extremity edema, patient is on compression socks NEURO: alert & oriented x 3 with fluent speech, no focal motor/sensory deficits  LABORATORY DATA:  I have reviewed the data as listed CBC Latest Ref Rng & Units 12/07/2019 11/12/2019 09/19/2019  WBC 4.0 - 10.5 K/uL 8.2 8.1 -  Hemoglobin 13.0 - 17.0 g/dL 9.6(L) 7.9(L) 7.7(L)  Hematocrit 39 - 52 % 31.2(L) 26.6(L) 25.9(L)  Platelets 150 - 400 K/uL 318 366 -     CMP Latest Ref Rng & Units 12/07/2019 11/12/2019 11/10/2019  Glucose 70 - 99 mg/dL 136(H) 109(H) -  BUN 8 - 23 mg/dL 12 11 -  Creatinine 0.61 - 1.24 mg/dL 0.71  0.78 0.80  Sodium 135 - 145 mmol/L 137 140 -  Potassium 3.5 - 5.1 mmol/L 3.8 4.5 -  Chloride 98 - 111 mmol/L 104 104 -  CO2 22 - 32 mmol/L 24 27 -  Calcium 8.9 - 10.3 mg/dL 9.8 9.1 -  Total Protein 6.5 - 8.1 g/dL 7.1 7.1 -  Total Bilirubin 0.3 - 1.2 mg/dL 0.2(L) 0.2(L) -  Alkaline Phos 38 - 126 U/L 62 69 -  AST 15 - 41 U/L 12(L) 12(L) -  ALT 0 - 44 U/L 9 9 -      RADIOGRAPHIC STUDIES: I have personally reviewed the radiological images as listed and agreed with the findings in the report. No results found.   ASSESSMENT & PLAN:  WES LEZOTTE is a 64 y.o. male with    1. Esophageal squamous cell carcinoma, in distal  esophagus, cTxN1M0 -The patient's work-up began in May 2021 after presenting with symptomatic anemia and syncope. -The patient's colonoscopy and an endoscopy was performed on 11/09/2019.  The endoscopy was significant for a large esophageal tumor which is circumferential and non-obstructive around the distal esophagus with invasion into the cardia. The path noted ulcerated, highly atypical squamous cell epithelium. -11/10/19 The patient then had a CT scan of the chest which noted a large mass in the lower third of the esophagus with ill-defined margins, measuring approximately 6.0 x 5.4 x 7.7 cm. The scan also demonstrated prominent gastrohepatic ligament lymph node adjacent to the lesser curvature. -His 11/22/19 PET showed hypermetabolic low thoracic esophageal mass, and hypermetabolic gastrohepatic ligament node, no other node or distant metastasis. -He underwent second EGD and biopsy by Dr [name] on 11/23/19. His pathology showed Invasive well-differentiated squamous cell carcinoma of esophagus which extend to stomach cardia.  -Patient started concurrent chemoradiation with weekly carboplatin and Taxol on 8/2, tolerated first week well. -Lab today is pending, if adequate, will proceed to second cycle chemotherapy at the same dose. -Follow-up weekly   2. Iron deficiency anemia secondary to blood loss -Secondary to #1 -The patient received blood transfusion on 09/27/19 and 1 dose Feraheme on 09/18/19 while in the hospital for symptomatic anemia. Due to persistent low iron and anemia, he received 2 more IV iron doses in 11/2019.  -Anemia improved  3. Weight loss and dysphagia -He initially presented with mild-moderate dysphagia. His mass is nonobstructive -The patient reports losing approximately 60 pounds over 6 months since January 2021 -He was seen by dietitian -He is on boost to bottles a day, I encouraged him to increase  4. Alcohol use  -The patient has a prior history of alcohol abuse.  -He  is currently drinking approximately 2-3 alcoholic beverages per week from the previous 3-4 beers daily. -I have strongly encouraged the patient to abstain from drinking alcohol.   PLAN: -He is tolerating treatment well -If labs are adequate, plan to proceed to second cycle chemotherapy today -Follow-up next week  No problem-specific Assessment & Plan notes found for this encounter.   No orders of the defined types were placed in this encounter.  All questions were answered. The patient knows to call the clinic with any problems, questions or concerns. No barriers to learning was detected. The total time spent in the appointment was 30 minutes.     [name], MD 12/13/2019   I, Joslyn Devon, am acting as scribe for [name], MD.   I have reviewed the above documentation for accuracy and completeness, and I agree with the above.

## 2019-12-09 ENCOUNTER — Ambulatory Visit
Admission: RE | Admit: 2019-12-09 | Discharge: 2019-12-09 | Disposition: A | Discharge status: Home / Self Care | Payer: 59 | Source: Ambulatory Visit | Attending: Radiation Oncology | Admitting: Radiation Oncology

## 2019-12-09 ENCOUNTER — Telehealth: Payer: Self-pay | Admitting: Nurse Practitioner

## 2019-12-09 ENCOUNTER — Other Ambulatory Visit: Payer: Self-pay

## 2019-12-09 DIAGNOSIS — Z5111 Encounter for antineoplastic chemotherapy: Secondary | ICD-10-CM | POA: Diagnosis not present

## 2019-12-09 NOTE — Telephone Encounter (Signed)
No 8/3 los °

## 2019-12-10 ENCOUNTER — Other Ambulatory Visit: Payer: Self-pay

## 2019-12-10 ENCOUNTER — Ambulatory Visit
Admission: RE | Admit: 2019-12-10 | Discharge: 2019-12-10 | Disposition: A | Discharge status: Home / Self Care | Payer: 59 | Source: Ambulatory Visit | Attending: Radiation Oncology | Admitting: Radiation Oncology

## 2019-12-10 DIAGNOSIS — Z5111 Encounter for antineoplastic chemotherapy: Secondary | ICD-10-CM | POA: Diagnosis not present

## 2019-12-10 DIAGNOSIS — C155 Malignant neoplasm of lower third of esophagus: Secondary | ICD-10-CM

## 2019-12-10 MED ORDER — SONAFINE EX EMUL
1.0000 "application " | Freq: Once | CUTANEOUS | Status: AC
  Administered 2019-12-10: 1 via TOPICAL

## 2019-12-10 NOTE — Progress Notes (Signed)
Pt here for patient teaching.  Pt given Radiation and You booklet, skin care instructions and Sonafine.  Reviewed areas of pertinence such as fatigue, hair loss, skin changes and throat changes . Pt able to give teach back of to pat skin and use unscented/gentle soap,apply Sonafine bid and avoid applying anything to skin within 4 hours of treatment. Pt verbalizes understanding of information given and will contact nursing with any questions or concerns.     Deanna Wiater M. Mateya Torti RN, BSN     

## 2019-12-11 ENCOUNTER — Other Ambulatory Visit: Payer: Self-pay | Admitting: Hematology

## 2019-12-11 DIAGNOSIS — C155 Malignant neoplasm of lower third of esophagus: Secondary | ICD-10-CM

## 2019-12-13 ENCOUNTER — Encounter: Payer: Self-pay | Admitting: Hematology

## 2019-12-13 ENCOUNTER — Other Ambulatory Visit: Payer: Self-pay

## 2019-12-13 ENCOUNTER — Inpatient Hospital Stay: Payer: 59

## 2019-12-13 ENCOUNTER — Inpatient Hospital Stay (HOSPITAL_BASED_OUTPATIENT_CLINIC_OR_DEPARTMENT_OTHER): Payer: 59 | Admitting: Hematology

## 2019-12-13 ENCOUNTER — Ambulatory Visit
Admission: RE | Admit: 2019-12-13 | Discharge: 2019-12-13 | Disposition: A | Discharge status: Home / Self Care | Payer: 59 | Source: Ambulatory Visit | Attending: Radiation Oncology | Admitting: Radiation Oncology

## 2019-12-13 VITALS — BP 103/70 | HR 66 | Temp 96.7°F | Resp 18 | Ht 71.0 in | Wt 201.5 lb

## 2019-12-13 DIAGNOSIS — C155 Malignant neoplasm of lower third of esophagus: Secondary | ICD-10-CM | POA: Diagnosis not present

## 2019-12-13 DIAGNOSIS — Z5111 Encounter for antineoplastic chemotherapy: Secondary | ICD-10-CM | POA: Diagnosis not present

## 2019-12-13 LAB — CBC WITH DIFFERENTIAL (CANCER CENTER ONLY)
Abs Immature Granulocytes: 0.12 10*3/uL — ABNORMAL HIGH (ref 0.00–0.07)
Basophils Absolute: 0 10*3/uL (ref 0.0–0.1)
Basophils Relative: 0 %
Eosinophils Absolute: 0.2 10*3/uL (ref 0.0–0.5)
Eosinophils Relative: 2 %
HCT: 32.3 % — ABNORMAL LOW (ref 39.0–52.0)
Hemoglobin: 10.1 g/dL — ABNORMAL LOW (ref 13.0–17.0)
Immature Granulocytes: 1 %
Lymphocytes Relative: 5 %
Lymphs Abs: 0.5 10*3/uL — ABNORMAL LOW (ref 0.7–4.0)
MCH: 25.1 pg — ABNORMAL LOW (ref 26.0–34.0)
MCHC: 31.3 g/dL (ref 30.0–36.0)
MCV: 80.1 fL (ref 80.0–100.0)
Monocytes Absolute: 0.6 10*3/uL (ref 0.1–1.0)
Monocytes Relative: 7 %
Neutro Abs: 8.4 10*3/uL — ABNORMAL HIGH (ref 1.7–7.7)
Neutrophils Relative %: 85 %
Platelet Count: 368 10*3/uL (ref 150–400)
RBC: 4.03 MIL/uL — ABNORMAL LOW (ref 4.22–5.81)
RDW: 21.5 % — ABNORMAL HIGH (ref 11.5–15.5)
WBC Count: 9.9 10*3/uL (ref 4.0–10.5)
nRBC: 0 % (ref 0.0–0.2)

## 2019-12-13 LAB — CMP (CANCER CENTER ONLY)
ALT: 8 U/L (ref 0–44)
AST: 9 U/L — ABNORMAL LOW (ref 15–41)
Albumin: 3.4 g/dL — ABNORMAL LOW (ref 3.5–5.0)
Alkaline Phosphatase: 61 U/L (ref 38–126)
Anion gap: 8 (ref 5–15)
BUN: 13 mg/dL (ref 8–23)
CO2: 25 mmol/L (ref 22–32)
Calcium: 10 mg/dL (ref 8.9–10.3)
Chloride: 103 mmol/L (ref 98–111)
Creatinine: 0.69 mg/dL (ref 0.61–1.24)
GFR, Est AFR Am: >60 mL/min (ref >60)
GFR, Estimated: >60 mL/min (ref >60)
Glucose, Bld: 96 mg/dL (ref 70–99)
Potassium: 4.3 mmol/L (ref 3.5–5.1)
Sodium: 136 mmol/L (ref 135–145)
Total Bilirubin: 0.3 mg/dL (ref 0.3–1.2)
Total Protein: 7.4 g/dL (ref 6.5–8.1)

## 2019-12-13 MED ORDER — PALONOSETRON HCL INJECTION 0.25 MG/5ML
INTRAVENOUS | Status: AC
  Filled 2019-12-13: qty 5

## 2019-12-13 MED ORDER — PALONOSETRON HCL INJECTION 0.25 MG/5ML
0.2500 mg | Freq: Once | INTRAVENOUS | Status: AC
  Administered 2019-12-13: 0.25 mg via INTRAVENOUS

## 2019-12-13 MED ORDER — DIPHENHYDRAMINE HCL 50 MG/ML IJ SOLN
25.0000 mg | Freq: Once | INTRAMUSCULAR | Status: AC
  Administered 2019-12-13: 25 mg via INTRAVENOUS

## 2019-12-13 MED ORDER — DIPHENHYDRAMINE HCL 50 MG/ML IJ SOLN
INTRAMUSCULAR | Status: AC
  Filled 2019-12-13: qty 1

## 2019-12-13 MED ORDER — FAMOTIDINE IN NACL 20-0.9 MG/50ML-% IV SOLN
INTRAVENOUS | Status: AC
  Filled 2019-12-13: qty 50

## 2019-12-13 MED ORDER — SODIUM CHLORIDE 0.9 % IV SOLN
300.0000 mg | Freq: Once | INTRAVENOUS | Status: AC
  Administered 2019-12-13: 300 mg via INTRAVENOUS
  Filled 2019-12-13: qty 30

## 2019-12-13 MED ORDER — SODIUM CHLORIDE 0.9 % IV SOLN
Freq: Once | INTRAVENOUS | Status: AC
  Filled 2019-12-13: qty 250

## 2019-12-13 MED ORDER — FEROSUL 325 (65 FE) MG PO TABS: 325.0000 mg | ORAL_TABLET | Freq: Every day | ORAL | 2 refills | Status: DC

## 2019-12-13 MED ORDER — SODIUM CHLORIDE 0.9 % IV SOLN
50.0000 mg/m2 | Freq: Once | INTRAVENOUS | Status: AC
  Administered 2019-12-13: 108 mg via INTRAVENOUS
  Filled 2019-12-13: qty 18

## 2019-12-13 MED ORDER — SODIUM CHLORIDE 0.9 % IV SOLN
20.0000 mg | Freq: Once | INTRAVENOUS | Status: AC
  Administered 2019-12-13: 20 mg via INTRAVENOUS
  Filled 2019-12-13: qty 20

## 2019-12-13 MED ORDER — FAMOTIDINE IN NACL 20-0.9 MG/50ML-% IV SOLN
20.0000 mg | Freq: Once | INTRAVENOUS | Status: AC
  Administered 2019-12-13: 20 mg via INTRAVENOUS

## 2019-12-13 NOTE — Patient Instructions (Signed)
[facility] Discharge Instructions for Patients Receiving Chemotherapy  Today you received the following chemotherapy agents: paclitaxel and carboplatin.  To help prevent nausea and vomiting after your treatment, we encourage you to take your nausea medication as directed.   If you develop nausea and vomiting that is not controlled by your nausea medication, call the clinic.   BELOW ARE SYMPTOMS THAT SHOULD BE REPORTED IMMEDIATELY:  *FEVER GREATER THAN 100.5 F  *CHILLS WITH OR WITHOUT FEVER  NAUSEA AND VOMITING THAT IS NOT CONTROLLED WITH YOUR NAUSEA MEDICATION  *UNUSUAL SHORTNESS OF BREATH  *UNUSUAL BRUISING OR BLEEDING  TENDERNESS IN MOUTH AND THROAT WITH OR WITHOUT PRESENCE OF ULCERS  *URINARY PROBLEMS  *BOWEL PROBLEMS  UNUSUAL RASH Items with * indicate a potential emergency and should be followed up as soon as possible.  Feel free to call the clinic should you have any questions or concerns. The clinic phone number is [phone].  Please show the CHEMO ALERT CARD at check-in to the Emergency Department and triage nurse.   

## 2019-12-14 ENCOUNTER — Ambulatory Visit: Payer: 59 | Admitting: Nurse Practitioner

## 2019-12-14 ENCOUNTER — Ambulatory Visit: Payer: 59

## 2019-12-14 ENCOUNTER — Encounter: Payer: Self-pay | Admitting: *Deleted

## 2019-12-14 ENCOUNTER — Other Ambulatory Visit: Payer: 59

## 2019-12-14 ENCOUNTER — Other Ambulatory Visit: Payer: Self-pay | Admitting: *Deleted

## 2019-12-14 ENCOUNTER — Other Ambulatory Visit: Payer: Self-pay

## 2019-12-14 ENCOUNTER — Ambulatory Visit
Admission: RE | Admit: 2019-12-14 | Discharge: 2019-12-14 | Disposition: A | Discharge status: Home / Self Care | Payer: 59 | Source: Ambulatory Visit | Attending: Radiation Oncology | Admitting: Radiation Oncology

## 2019-12-14 DIAGNOSIS — Z5111 Encounter for antineoplastic chemotherapy: Secondary | ICD-10-CM | POA: Diagnosis not present

## 2019-12-14 DIAGNOSIS — M109 Gout, unspecified: Secondary | ICD-10-CM | POA: Insufficient documentation

## 2019-12-14 NOTE — Patient Outreach (Signed)
[facility]) Care Management  12/14/2019  Brett Dawson 1955-11-24 [id number]   [facility] Follow up Telephone Assessment/Screen for [facility] insurancereferral  Brett Dawson was referred to [facility] on 11/24/19 Referral Source:[employer] Insurance Referral Reason:esophageal tumor with invasion into cradia  Insurance:[employer] Admissions x1in last 6 months  11/23/19 EGD with upper endoscopy Dr Dulce Sellar 7/16/21AnotherFeraheme dosegiven Admission 09/17/19 to 09/19/19 syncopehgb 6.7 given blood+ feraheme on 09/18/19 dx systemic iron deficiency anemia observed and Hgb increased to 7.7 followed by [facility] gastroenterology    Follow up assessment after second oncology treatment Patient is able to verify HIPAA [facility] Portability and Accountability Act) identifiers, date of birth (DOB) and address Reviewed and addressed the purpose of the follow up call with the patient  Consent: [facility] ([facility] Customer service manager) RN CM reviewed [facility] services with patient. Patient gave verbal consent for services.  Assessment of symptoms after treatments He reports he continues to cope well  He denies he has fatigue He confirms he was anemic "as a child. They use to give me liver" He reports he now is not able to eat liver related to worsening symptoms of his gout.  He reports being familiar with fatigue and feels he has adjusted so that he is not able to determine any increased fatigue symptoms  He reports post oncology treatment symptoms noted to include burping and tasting foods recently ingested, chest discomfort/soreness and Phlegm noted primarily in the mornings that may cause some shortness of breath (sob). He also reports having a history of phlegm in his throat before being diagnosed and starting treatment (tx) He is on Protonix 40 mg daily [facility] RN CM discussed home interventions to assist with these symptoms to include eating soft foods to promote complete  digestion, GERD (gastroesophageal reflux disease), difference in chest pain and discomfort (when to report to MD) work on cough & breathing techniques to remove phlegm and encourage the night use of a prn inhaler he reports the pcp had provided a while before his esophageal diagnosis. Explained the use of the inhaler to promote opening up the lungs. He will check his pharmacy to see if he has any further refills for this inhaler. Not listed in Epic  He reports he sleeps on his side. He is unable to state if snoring is noted. His insurance coverage does not offer an over the counter (OTC) program to possibly get a pulse oximeter to check oxygen intake    Constipation related to iron intake is minimal per patient Discussed the importance of monitoring constipation at home. He reports his stool are black dark and solid at intervals and he take miralax Encouraged vegetable, water  He has gout and is not able to take certain foods no liver  Still reports no problem swallowing except bread and hamburger  He reports eating pork chop, chicken in small pieces Discussed discount coupons for boost  [facility] RN CM consulted with [facility] CMA about boost coupons  Still reported with some weight loss and he has been encouraged to increase the boost intake from bid to tid by Dr Mosetta Putt per pt   Importance of follow up with primary care provider (PCP) saw Dr Wynelle Link after gout dx in January or  March 2021  encourage to make an appointment with pcp  Discussed the importance of follow ups with primary care provider (pcp)  Falls No further falls   Malignant neoplasm Stage  Brett Dawson and [facility] RN CM discussed the cancer staging Discussed  size 6.0 x 5.4 x 7.7 being between size of an egg and a peach He report Dr Cliffton Asters showed him pictures [facility] RN CM discussed and answered questions about Dr Mosetta Putt note indicating 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 He lives alone and isindependent with all careHe has a brother [name] who assists at intervals. 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, 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, cN1, cM0) , hepatic and bilateral simple renal cysts, colonic diverticulosis, dysphagia,60 lb weight loss in 6 months since January 2021, 5/14/21syncope leading to systemic iron deficiency anemia- reports dx as anemic as a child,history of alcohol abuse,(pt now states2-3 beers per week was 3-4 qd) gout  Fellwith syncope on 09/17/19 withno injury He was entering a bar  Plans [facility] RN CM will follow up with Brett Dawson within the next 14-21 business days Pt encouraged to return a call to [facility] RN CM prn Routed note to MD  Goals Addressed              This Visit's Progress     Patient Stated   .  Patient will be able to manage his health needs (including weight loss, fall prevention) at home related to treatments for esophageal cancer (pt-stated)   On track     CARE PLAN ENTRY (see longitudinal plan of care for additional care plan information)  Current Barriers:  Marland Kitchen Knowledge Deficits related to fall precautions . Knowledge Deficits related to esophageal cancer, weight loss and side effects of cancer treatment  . Cognitive Deficits  Clinical  Goal(s):  Marland Kitchen Over the next 45 days, patient will not experience additional falls Last fall Sep 17 2019  . Over the next 90 days, patient will be able to maintain his weight and report any abnormal or worsening symptoms to MDs during and after cancer treatment- Still reported with some weight loss and he has been encouraged to increase the boost intake from bid to tid by Dr Mosetta Putt per pt   Interventions:   . Assessed for falls since last encounter. . Assessed patients knowledge of fall risk prevention secondary to previously provided education. . Follow up on his coping and home management after medical appointments and treatments . Assess & Encouraged good nutritional intake to include intake of Boost supplements . Assess for weight changes . Answered questions about cancer staging, food intake, food texture . Assessed for dental care and needs . Discussed the importance of monitoring and reporting any bleeding, sores, pain or swelling of his teeth to his pcp or oncologist [facility] RN CM discussed minimizing risks of infection  . Chemotherapy, esophageal cancer, nausea and vomiting with cancer treatment, diet after mouth or throat surgery, soft diet EMMI materials sent via e-mail . Assessed for social determinants of health needs and encouraged patient to outreach if  needs arise . Sent further EMMI materials on stages of cancer, low purine diet, home treatments for constipation when you have cancer, coughing techniques and barrett's esophagus discharge instructions via his e-mail address   Patient Self Care Activities:  . De-clutter walkways . Change positions slowly . Wear secure fitting shoes at all times with ambulation . Utilize home lighting for dim lit areas . Have self and pet awareness at all times . Reports any bleeding, sores, pain or swelling of his teeth to his pcp or oncologist  . Eats soft foods and take in nutritional supplements (Boost)-increase intake from bid to tid  prn . Maintains good oral care  . Follow up with MD about cancer staging . Night cough, breathing treatments  . Make an appointment with pcp . Check on inhaler prescription . Review EMMI education materials   Plan: . CCM RN CM will follow up within the next 14-21 business days to see how he is doing with his oncology treatments, evaluate for care coordination and disease management needs    Initial goal documentation Please see other previous [facility] care plan information listed in Epic under the flow sheet section            Tamarra Geiselman L. Noelle Penner, RN, BSN, CCM [facility] Telephonic Care Management Care Coordinator Office number [phone] Mobile number [phone]  Main [facility] number [phone] Fax number [phone]

## 2019-12-15 ENCOUNTER — Other Ambulatory Visit: Payer: Self-pay

## 2019-12-15 ENCOUNTER — Ambulatory Visit
Admission: RE | Admit: 2019-12-15 | Discharge: 2019-12-15 | Disposition: A | Discharge status: Home / Self Care | Payer: 59 | Source: Ambulatory Visit | Attending: Radiation Oncology | Admitting: Radiation Oncology

## 2019-12-15 DIAGNOSIS — Z5111 Encounter for antineoplastic chemotherapy: Secondary | ICD-10-CM | POA: Diagnosis not present

## 2019-12-16 ENCOUNTER — Ambulatory Visit
Admission: RE | Admit: 2019-12-16 | Discharge: 2019-12-16 | Disposition: A | Discharge status: Home / Self Care | Payer: 59 | Source: Ambulatory Visit | Attending: Radiation Oncology | Admitting: Radiation Oncology

## 2019-12-16 ENCOUNTER — Other Ambulatory Visit: Payer: Self-pay

## 2019-12-16 DIAGNOSIS — Z5111 Encounter for antineoplastic chemotherapy: Secondary | ICD-10-CM | POA: Diagnosis not present

## 2019-12-17 ENCOUNTER — Other Ambulatory Visit: Payer: Self-pay

## 2019-12-17 ENCOUNTER — Other Ambulatory Visit: Payer: Self-pay | Admitting: *Deleted

## 2019-12-17 ENCOUNTER — Ambulatory Visit
Admission: RE | Admit: 2019-12-17 | Discharge: 2019-12-17 | Disposition: A | Discharge status: Home / Self Care | Payer: 59 | Source: Ambulatory Visit | Attending: Radiation Oncology | Admitting: Radiation Oncology

## 2019-12-17 DIAGNOSIS — Z5111 Encounter for antineoplastic chemotherapy: Secondary | ICD-10-CM | POA: Diagnosis not present

## 2019-12-17 NOTE — Patient Outreach (Signed)
[facility]) Care Management  12/17/2019  Brett Dawson [birth year] [id number]   [place] coordination-  [facility] multidisciplinary care discussion (MCD)   [facility] MCD team was sent a template report on 12/08/19  [facility] RN CM reviewed Epic notes [facility] RN CM reviewed and provided an update on patient status and progression to the [facility] MCD team He is doing well with his oncology treatments daily in the mornings, still goes to work, has support of his co workers, Has loss further weight (from 208 lbs on 12/07/19 to 201 lbs on 12/13/19). To increase boost from bid to tid. Boost coupons to be mailed from [facility] when available via [place] RN CM answered questions for MCD team  Recommendations - Continue [facility] services,  Jenny Reichmann, [place] staff ,recommends patient connects with [facility] program via [facility] customer service number [phone]) to complete the HRA (health risk assessment) application to receive assist with possible services and funds   Plan [facility] RN CM will follow up with [name] within the next 14-21 business days Pt encouraged to return a call to [facility] RN CM prn Routed note to MD   [place]. Lavina Hamman, RN, BSN, [place] Coordinator Office number [phone] Mobile number [phone]  Main [facility] number [phone] Fax number [phone]

## 2019-12-19 NOTE — Progress Notes (Signed)
[place]   Telephone:([phone]) [phone] [phone]) [phone]   Clinic Follow up Note   Patient Care Team: [name], MD as PCP - General (Family Medicine) [name], MD as Consulting Physician (Oncology) Jonnie Finner, RN as Registered Nurse Otis Brace, MD as Consulting Physician (Gastroenterology) Barbaraann Faster, RN as [place] Management Karie Mainland, RD as Dietitian (Nutrition) Lajuana Matte, MD as Consulting Physician (Cardiothoracic Surgery) 12/20/2019  CHIEF COMPLAINT: F/u esophagus cancer   SUMMARY OF ONCOLOGIC HISTORY: Oncology History Overview Note  Cancer Staging Malignant neoplasm of lower third of esophagus [facility]) Staging form: Esophagus - Squamous Cell Carcinoma, AJCC 8th Edition - Clinical stage from 11/23/2019: Stage Unknown (cTX, cN1, cM0) - Signed by [name], MD on 11/26/2019     Malignant neoplasm of lower third of esophagus ([place])  09/14/2019 Imaging   CT AP W Contrast 09/14/19 IMPRESSION: 1. Large hiatal hernia. 2. Hepatic and bilateral simple renal cysts. 3. Colonic diverticulosis. 4. Small fat containing left inguinal hernia.   11/10/2019 Imaging   CT Chest 11/10/19  IMPRESSION: 1. There is a large mass of the lower third of the esophagus, with ill-defined margins, measuring approximately 6.0 x 5.4 x 7.7 cm. This appears enlarged and more appreciably masslike than appearance on prior CT dated 09/14/2019. 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 2. There are prominent gastrohepatic ligament lymph nodes adjacent to the lesser curvature, concerning for nodal metastatic disease although not ideally imaged on this examination of the chest. 3. No evidence of metastatic disease within in the chest. 4. Coronary artery disease.   11/22/2019 PET scan   PET 11/22/19 IMPRESSION: 1. Intensely hypermetabolic (max SUV 24.2) lower thoracic esophageal 6.0 x 5.5 x 9.4 cm mass extending to the  esophagogastric junction with probable involvement of the gastric cardia, compatible with primary esophageal malignancy. 2. Hypermetabolic gastrohepatic ligament nodal metastases. 3. No hypermetabolic liver or other distant metastases. 4. Chronic findings include: Aortic Atherosclerosis (ICD10-I70.0). Coronary atherosclerosis. Marked diffuse colonic diverticulosis.   11/23/2019 Procedure   EGD with Upper Endoscopy by Dr [name] 11/23/19 IMPRESSION - Partially obstructing, likely malignant esophageal tumor was found in the lower third of the esophagus. Biopsied. 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 Biopsied. - Normal duodenal bulb, first portion of the duodenum and second portion of the duodenum.   11/23/2019 Initial Biopsy   FINAL MICROSCOPIC DIAGNOSIS:   A. STOMACH, CARDIA, BIOPSY:  - Invasive well-differentiated squamous cell carcinoma.  See comment   B. ESOPHAGUS, DISTAL, BIOPSY:  - Invasive well-differentiated squamous cell carcinoma.  See comment     COMMENT:   A  B.   Dr. Tresa Moore reviewed the case and concurs with the diagnosis.  Dr. [name] was paged on 11/24/2019.    11/23/2019 Cancer Staging   Staging form: Esophagus - Squamous Cell Carcinoma, AJCC 8th Edition - Clinical stage from 11/23/2019: Stage Unknown (cTX, cN1, cM0) - Signed by [name], MD on 11/26/2019   11/24/2019 Initial Diagnosis   Malignant neoplasm of lower third of esophagus ([place])   12/07/2019 -  Chemotherapy   Concurrent chemoradiation with weekly carboplatin and Taxol starting 12/07/19   12/07/2019 -  Radiation Therapy   Concurrent chemoradiation with Dr Lisbeth Renshaw starting 12/07/19     CURRENT THERAPY: ccRT with taxol and carboplatin, starting 12/07/19   INTERVAL HISTORY: Mr. Cockrell returns for f/u and treatment as scheduled. He is beginning week 3 chemoRT.  He feels well, denies fatigue.  He had nausea one  time that resolved with anti-emetic.  He thinks he may not have eaten much that day.  Manages  chronic constipation with stool softener.  Odynophagia resolved since starting radiation; no dysphagia if he eats small pieces.  He ate meat last night without difficulty.  He has to clear his phlegm in the morning and "has to fill his lungs back up," which causes some mild discomfort in the lower chest then resolves.  Denies exertional chest pain or dyspnea. Denies fever, chills.  Denies mucositis, rash, neuropathy.   MEDICAL HISTORY:  Past Medical History:  Diagnosis Date  . Anemia   . Anemia   . Malignant neoplasm of lower third of esophagus ([place]) 11/24/2019    SURGICAL HISTORY: Past Surgical History:  Procedure Laterality Date  . BIOPSY  11/23/2019   Procedure: BIOPSY;  Surgeon: Arta Silence, MD;  Location: [facility] ENDOSCOPY;  Service: Endoscopy;;  . ESOPHAGOGASTRODUODENOSCOPY (EGD) WITH PROPOFOL N/A 11/23/2019   Procedure: ESOPHAGOGASTRODUODENOSCOPY (EGD) WITH PROPOFOL;  Surgeon: Arta Silence, MD;  Location: [facility] ENDOSCOPY;  Service: Endoscopy;  Laterality: N/A;  . TONSILLECTOMY      I have reviewed the social history and family history with the patient and they are unchanged from previous note.  ALLERGIES:  has No Known Allergies.  MEDICATIONS:  Current Outpatient Medications  Medication Sig Dispense Refill  . Colchicine 0.6 MG CAPS Take 1 capsule by mouth 2 (two) times daily as needed (gout).     . FEROSUL 325 (65 Fe) MG tablet Take 1 tablet (325 mg total) by mouth daily. 30 tablet 2  . ondansetron (ZOFRAN) 8 MG tablet Take 1 tablet (8 mg total) by mouth 2 (two) times daily as needed for refractory nausea / vomiting. Start on day 3 after chemo. 30 tablet 1  . pantoprazole (PROTONIX) 40 MG tablet Take 40 mg by mouth daily.    . polyethylene glycol (MIRALAX / GLYCOLAX) 17 g packet Take 17 g by mouth daily.    . prochlorperazine (COMPAZINE) 10 MG tablet TAKE 1 TABLET(10 MG) BY MOUTH EVERY 6 HOURS AS NEEDED FOR NAUSEA OR VOMITING 30 tablet 1   No current facility-administered  medications for this visit.   Facility-Administered Medications Ordered in Other Visits  Medication Dose Route Frequency Provider Last Rate Last Admin  . CARBOplatin (PARAPLATIN) 300 mg in sodium chloride 0.9 % 250 mL chemo infusion  300 mg Intravenous Once [name], MD      . dexamethasone (DECADRON) 20 mg in sodium chloride 0.9 % 50 mL IVPB  20 mg Intravenous Once [name], MD      . famotidine (PEPCID) IVPB 20 mg premix  20 mg Intravenous Once [name], MD 200 mL/hr at 12/20/19 1009 20 mg at 12/20/19 1009  . PACLitaxel (TAXOL) 108 mg in sodium chloride 0.9 % 250 mL chemo infusion (</= 80mg /m2)  50 mg/m2 (Treatment Plan Recorded) Intravenous Once [name], MD        PHYSICAL EXAMINATION: ECOG PERFORMANCE STATUS: 0 - Asymptomatic  Vitals:   12/20/19 0814  BP: 105/62  Pulse: 71  Resp: 18  Temp: (!) 96.8 F (36 C)  SpO2: 100%   Filed Weights   12/20/19 0814  Weight: 206 lb 1.6 oz (93.5 kg)    GENERAL:alert, no distress and comfortable SKIN: No rash EYES: sclera clear LUNGS: clear with normal breathing effort HEART: regular rate & rhythm ABDOMEN: abdomen soft, non-tender and normal bowel sounds NEURO: alert & oriented x 3 with fluent speech  LABORATORY DATA:  I have  reviewed the data as listed CBC Latest Ref Rng & Units 12/20/2019 12/13/2019 12/07/2019  WBC 4.0 - 10.5 K/uL 4.9 9.9 8.2  Hemoglobin 13.0 - 17.0 g/dL 10.4(L) 10.1(L) 9.6(L)  Hematocrit 39 - 52 % 33.0(L) 32.3(L) 31.2(L)  Platelets 150 - 400 K/uL 249 368 318     CMP Latest Ref Rng & Units 12/20/2019 12/13/2019 12/07/2019  Glucose 70 - 99 mg/dL 116(H) 96 136(H)  BUN 8 - 23 mg/dL 17 13 12   Creatinine 0.61 - 1.24 mg/dL 0.68 0.69 0.71  Sodium 135 - 145 mmol/L 138 136 137  Potassium 3.5 - 5.1 mmol/L 4.0 4.3 3.8  Chloride 98 - 111 mmol/L 105 103 104  CO2 22 - 32 mmol/L 24 25 24   Calcium 8.9 - 10.3 mg/dL 9.6 10.0 9.8  Total Protein 6.5 - 8.1 g/dL 6.7 7.4 7.1  Total Bilirubin 0.3 - 1.2 mg/dL <0.2(L) 0.3 0.2(L)   Alkaline Phos 38 - 126 U/L 65 61 62  AST 15 - 41 U/L 14(L) 9(L) 12(L)  ALT 0 - 44 U/L 13 8 9       RADIOGRAPHIC STUDIES: I have personally reviewed the radiological images as listed and agreed with the findings in the report. No results found.   ASSESSMENT & PLAN: RAJEEV ESCUE a 64 y.o.malewith    1.Esophagealsquamous cell carcinoma, in distalesophagus,cTxN1M0 -He presented with symptomatic anemia and syncope in 09/2019 and 60 pound weight loss since 05/2019.  Endoscopy on 11/09/2019 significant for a large distal esophageal tumor with invasion into the cardia. The path noted ulcerated, highly atypical squamous cell epithelium. -11/10/19 CT noted a large mass in the lower third of the esophagus measuring approximately 6.0 x 5.4 x 7.7 cm with prominent gastrohepatic ligament lymph node adjacent to the lesser curvature. -11/22/2019 PET showed hypermetabolic low thoracic esophageal mass with probable involvement in the gastric cardia, and hypermetabolic gastrohepatic ligament node,no other node or distant metastasis. -He underwentsecondEGDand biopsyby Dr [name] on 11/23/19. Pathology showed Invasive well-differentiated squamous cell carcinomaof esophaguswhich extend tostomach cardia.  -He was seen by cardiothoracic surgeon Dr. Kipp Brood on 12/03/2019 who feels this is potentially resectable in the event he is able to maintain his weight (goal >190) during ccRT.  He will require post therapy EGD with EUS to ensure no mediastinal invasion given the size of his tumor and repeat PET/CT 4 weeks after completing neoadjuvant treatment at which time he will discuss surgical options with the patient. -For his locally advanced esophageal squamous cell carcinoma, he began neoadjuvant concurrent chemoradiation with Taxol/carbo with curative intent on 8/2 (chemo starting 12/07/19)  2. Iron deficiency anemia secondary to blood loss -Secondary to #1 -S/p blood transfusion on 09/27/19 and IV Feraheme  on 09/18/19 while in the hospital for symptomatic anemia, then again on 7/16 and 11/26/2019. -Hgb improved to 10.4 today, ferritin 183 on 12/07/19 much improved   3. Weight loss and dysphagia -He initially presented with mild-moderate dysphagia. His mass is nonobstructive -The patient reports losing approximately 60 pounds over 6 months since January 2021 -Followed by dietitian -Weight stable on a normal diet, mild dysphagia without odynophagia  4. Alcohol use  -The patient has a prior history of alcohol abuse.  -He has cut back significantly, again encouraged to stop altogether  Disposition: Mr. Tanguma appears stable.  He has completed 2 weeks of ccRT with Taxol and carboplatin.  He tolerates chemo well without significant side effects.  Odynophagia resolved, he is able to eat a normal diet.  He is gaining weight.  Overall  tolerating treatment well.  I reviewed the CBC and CMP from today.  He will proceed with cycle 3 Taxol/carbo today.  He will continue daily radiation.  Follow-up weekly on treatment.  All questions were answered. The patient knows to call the clinic with any problems, questions or concerns. No barriers to learning were detected.     Alla Feeling, NP 12/20/19

## 2019-12-20 ENCOUNTER — Inpatient Hospital Stay: Payer: 59 | Admitting: Nutrition

## 2019-12-20 ENCOUNTER — Encounter: Payer: Self-pay | Admitting: Nurse Practitioner

## 2019-12-20 ENCOUNTER — Ambulatory Visit
Admission: RE | Admit: 2019-12-20 | Discharge: 2019-12-20 | Disposition: A | Discharge status: Home / Self Care | Payer: 59 | Source: Ambulatory Visit | Attending: Radiation Oncology | Admitting: Radiation Oncology

## 2019-12-20 ENCOUNTER — Inpatient Hospital Stay: Payer: 59

## 2019-12-20 ENCOUNTER — Other Ambulatory Visit: Payer: Self-pay

## 2019-12-20 ENCOUNTER — Inpatient Hospital Stay (HOSPITAL_BASED_OUTPATIENT_CLINIC_OR_DEPARTMENT_OTHER): Payer: 59 | Admitting: Nurse Practitioner

## 2019-12-20 VITALS — BP 105/62 | HR 71 | Temp 96.8°F | Resp 18 | Ht 71.0 in | Wt 206.1 lb

## 2019-12-20 DIAGNOSIS — C155 Malignant neoplasm of lower third of esophagus: Secondary | ICD-10-CM

## 2019-12-20 DIAGNOSIS — Z5111 Encounter for antineoplastic chemotherapy: Secondary | ICD-10-CM | POA: Diagnosis not present

## 2019-12-20 LAB — CMP (CANCER CENTER ONLY)
ALT: 13 U/L (ref 0–44)
AST: 14 U/L — ABNORMAL LOW (ref 15–41)
Albumin: 3.2 g/dL — ABNORMAL LOW (ref 3.5–5.0)
Alkaline Phosphatase: 65 U/L (ref 38–126)
Anion gap: 9 (ref 5–15)
BUN: 17 mg/dL (ref 8–23)
CO2: 24 mmol/L (ref 22–32)
Calcium: 9.6 mg/dL (ref 8.9–10.3)
Chloride: 105 mmol/L (ref 98–111)
Creatinine: 0.68 mg/dL (ref 0.61–1.24)
GFR, Est AFR Am: >60 mL/min (ref >60)
GFR, Estimated: >60 mL/min (ref >60)
Glucose, Bld: 116 mg/dL — ABNORMAL HIGH (ref 70–99)
Potassium: 4 mmol/L (ref 3.5–5.1)
Sodium: 138 mmol/L (ref 135–145)
Total Bilirubin: <0.2 mg/dL — ABNORMAL LOW (ref 0.3–1.2)
Total Protein: 6.7 g/dL (ref 6.5–8.1)

## 2019-12-20 LAB — CBC WITH DIFFERENTIAL (CANCER CENTER ONLY)
Abs Immature Granulocytes: 0.02 10*3/uL (ref 0.00–0.07)
Basophils Absolute: 0 10*3/uL (ref 0.0–0.1)
Basophils Relative: 0 %
Eosinophils Absolute: 0.1 10*3/uL (ref 0.0–0.5)
Eosinophils Relative: 2 %
HCT: 33 % — ABNORMAL LOW (ref 39.0–52.0)
Hemoglobin: 10.4 g/dL — ABNORMAL LOW (ref 13.0–17.0)
Immature Granulocytes: 0 %
Lymphocytes Relative: 6 %
Lymphs Abs: 0.3 10*3/uL — ABNORMAL LOW (ref 0.7–4.0)
MCH: 25.6 pg — ABNORMAL LOW (ref 26.0–34.0)
MCHC: 31.5 g/dL (ref 30.0–36.0)
MCV: 81.1 fL (ref 80.0–100.0)
Monocytes Absolute: 0.4 10*3/uL (ref 0.1–1.0)
Monocytes Relative: 8 %
Neutro Abs: 4.1 10*3/uL (ref 1.7–7.7)
Neutrophils Relative %: 84 %
Platelet Count: 249 10*3/uL (ref 150–400)
RBC: 4.07 MIL/uL — ABNORMAL LOW (ref 4.22–5.81)
RDW: 22.4 % — ABNORMAL HIGH (ref 11.5–15.5)
WBC Count: 4.9 10*3/uL (ref 4.0–10.5)
nRBC: 0 % (ref 0.0–0.2)

## 2019-12-20 MED ORDER — SODIUM CHLORIDE 0.9 % IV SOLN
20.0000 mg | Freq: Once | INTRAVENOUS | Status: AC
  Administered 2019-12-20: 20 mg via INTRAVENOUS
  Filled 2019-12-20: qty 20

## 2019-12-20 MED ORDER — DIPHENHYDRAMINE HCL 50 MG/ML IJ SOLN
INTRAMUSCULAR | Status: AC
  Filled 2019-12-20: qty 1

## 2019-12-20 MED ORDER — SODIUM CHLORIDE 0.9 % IV SOLN
50.0000 mg/m2 | Freq: Once | INTRAVENOUS | Status: AC
  Administered 2019-12-20: 108 mg via INTRAVENOUS
  Filled 2019-12-20: qty 18

## 2019-12-20 MED ORDER — SODIUM CHLORIDE 0.9 % IV SOLN
300.0000 mg | Freq: Once | INTRAVENOUS | Status: AC
  Administered 2019-12-20: 300 mg via INTRAVENOUS
  Filled 2019-12-20: qty 30

## 2019-12-20 MED ORDER — FAMOTIDINE IN NACL 20-0.9 MG/50ML-% IV SOLN
INTRAVENOUS | Status: AC
  Filled 2019-12-20: qty 50

## 2019-12-20 MED ORDER — SODIUM CHLORIDE 0.9 % IV SOLN
Freq: Once | INTRAVENOUS | Status: AC
  Filled 2019-12-20: qty 250

## 2019-12-20 MED ORDER — FAMOTIDINE IN NACL 20-0.9 MG/50ML-% IV SOLN
20.0000 mg | Freq: Once | INTRAVENOUS | Status: AC
  Administered 2019-12-20: 20 mg via INTRAVENOUS

## 2019-12-20 MED ORDER — PALONOSETRON HCL INJECTION 0.25 MG/5ML
0.2500 mg | Freq: Once | INTRAVENOUS | Status: AC
  Administered 2019-12-20: 0.25 mg via INTRAVENOUS

## 2019-12-20 MED ORDER — PALONOSETRON HCL INJECTION 0.25 MG/5ML
INTRAVENOUS | Status: AC
  Filled 2019-12-20: qty 5

## 2019-12-20 MED ORDER — DIPHENHYDRAMINE HCL 50 MG/ML IJ SOLN
25.0000 mg | Freq: Once | INTRAMUSCULAR | Status: AC
  Administered 2019-12-20: 25 mg via INTRAVENOUS

## 2019-12-20 NOTE — Progress Notes (Signed)
Nutrition follow-up completed with patient during infusion for esophageal cancer. Weight documented is 206.1 pounds from August 16 down slightly from 208.2 pounds August 3. Patient reports his appetite is normal.   Reports he is swallowing easier. He ate a steak last night by chewing very well and eating small bites. Reports he is having some constipation but is taking over-the-counter medication. Reports difficulty with anemia.  Nutrition diagnosis: Unintended weight loss continues.  Intervention: Encourage patient to continue strategies for soft foods as tolerated.  Recommended he continue small bites and chew food well. Patient should continue boost or Ensure twice daily.  Provided coupons. Continue bowel regimen.  Encouraged increased fluid intake. Educated patient on high iron foods and strategies to increase iron absorption.  Provided fact sheet.  Monitoring, evaluation, goals: Patient will tolerate increased calories and protein to minimize weight loss.  Next visit: Monday, August 23 during infusion.  **Disclaimer: This note was dictated with voice recognition software. Similar sounding words can inadvertently be transcribed and this note may contain transcription errors which may not have been corrected upon publication of note.**

## 2019-12-20 NOTE — Patient Instructions (Signed)
[facility] Discharge Instructions for Patients Receiving Chemotherapy  Today you received the following chemotherapy agents: paclitaxel and carboplatin.  To help prevent nausea and vomiting after your treatment, we encourage you to take your nausea medication as directed.   If you develop nausea and vomiting that is not controlled by your nausea medication, call the clinic.   BELOW ARE SYMPTOMS THAT SHOULD BE REPORTED IMMEDIATELY:  *FEVER GREATER THAN 100.5 F  *CHILLS WITH OR WITHOUT FEVER  NAUSEA AND VOMITING THAT IS NOT CONTROLLED WITH YOUR NAUSEA MEDICATION  *UNUSUAL SHORTNESS OF BREATH  *UNUSUAL BRUISING OR BLEEDING  TENDERNESS IN MOUTH AND THROAT WITH OR WITHOUT PRESENCE OF ULCERS  *URINARY PROBLEMS  *BOWEL PROBLEMS  UNUSUAL RASH Items with * indicate a potential emergency and should be followed up as soon as possible.  Feel free to call the clinic should you have any questions or concerns. The clinic phone number is [phone].  Please show the CHEMO ALERT CARD at check-in to the Emergency Department and triage nurse.   

## 2019-12-21 ENCOUNTER — Other Ambulatory Visit: Payer: Self-pay

## 2019-12-21 ENCOUNTER — Ambulatory Visit
Admission: RE | Admit: 2019-12-21 | Discharge: 2019-12-21 | Disposition: A | Discharge status: Home / Self Care | Payer: 59 | Source: Ambulatory Visit | Attending: Radiation Oncology | Admitting: Radiation Oncology

## 2019-12-21 DIAGNOSIS — Z5111 Encounter for antineoplastic chemotherapy: Secondary | ICD-10-CM | POA: Diagnosis not present

## 2019-12-22 ENCOUNTER — Other Ambulatory Visit: Payer: Self-pay

## 2019-12-22 ENCOUNTER — Telehealth: Payer: Self-pay | Admitting: Nurse Practitioner

## 2019-12-22 ENCOUNTER — Ambulatory Visit
Admission: RE | Admit: 2019-12-22 | Discharge: 2019-12-22 | Disposition: A | Discharge status: Home / Self Care | Payer: 59 | Source: Ambulatory Visit | Attending: Radiation Oncology | Admitting: Radiation Oncology

## 2019-12-22 DIAGNOSIS — Z5111 Encounter for antineoplastic chemotherapy: Secondary | ICD-10-CM | POA: Diagnosis not present

## 2019-12-22 NOTE — Telephone Encounter (Signed)
No 8/16 los

## 2019-12-23 ENCOUNTER — Ambulatory Visit
Admission: RE | Admit: 2019-12-23 | Discharge: 2019-12-23 | Disposition: A | Discharge status: Home / Self Care | Payer: 59 | Source: Ambulatory Visit | Attending: Radiation Oncology | Admitting: Radiation Oncology

## 2019-12-23 ENCOUNTER — Other Ambulatory Visit: Payer: Self-pay

## 2019-12-23 DIAGNOSIS — Z5111 Encounter for antineoplastic chemotherapy: Secondary | ICD-10-CM | POA: Diagnosis not present

## 2019-12-24 ENCOUNTER — Ambulatory Visit
Admission: RE | Admit: 2019-12-24 | Discharge: 2019-12-24 | Disposition: A | Discharge status: Home / Self Care | Payer: 59 | Source: Ambulatory Visit | Attending: Radiation Oncology | Admitting: Radiation Oncology

## 2019-12-24 ENCOUNTER — Other Ambulatory Visit: Payer: Self-pay | Admitting: Radiation Oncology

## 2019-12-24 ENCOUNTER — Telehealth: Payer: 59 | Admitting: Thoracic Surgery (Cardiothoracic Vascular Surgery)

## 2019-12-24 ENCOUNTER — Other Ambulatory Visit: Payer: Self-pay

## 2019-12-24 DIAGNOSIS — Z5111 Encounter for antineoplastic chemotherapy: Secondary | ICD-10-CM | POA: Diagnosis not present

## 2019-12-24 MED ORDER — SUCRALFATE 1 G PO TABS
1.0000 g | ORAL_TABLET | Freq: Four times a day (QID) | ORAL | 2 refills | Status: DC
Start: 2019-12-24 — End: 2020-04-21

## 2019-12-24 NOTE — Progress Notes (Signed)
Brett Dawson   Telephone:(336) [phone] Fax:(336) [phone]   Clinic Follow up Note   Patient Care Team: [name], MD as PCP - General (Family Medicine) [name], MD as Consulting Physician (Oncology) Jonnie Finner, RN as Registered Nurse Otis Brace, MD as Consulting Physician (Gastroenterology) Barbaraann Faster, RN as [place] Management Karie Mainland, RD as Dietitian (Nutrition) Lajuana Matte, MD as Consulting Physician (Cardiothoracic Surgery)  Date of Service:  12/27/2019  CHIEF COMPLAINT: F/u of Esophageal cancer  SUMMARY OF ONCOLOGIC HISTORY: Oncology History Overview Note  Cancer Staging Malignant neoplasm of lower third of esophagus [facility]) Staging form: Esophagus - Squamous Cell Carcinoma, AJCC 8th Edition - Clinical stage from 11/23/2019: Stage Unknown (cTX, cN1, cM0) - Signed by [name], MD on 11/26/2019     Malignant neoplasm of lower third of esophagus ([place])  09/14/2019 Imaging   CT AP W Contrast 09/14/19 IMPRESSION: 1. Large hiatal hernia. 2. Hepatic and bilateral simple renal cysts. 3. Colonic diverticulosis. 4. Small fat containing left inguinal hernia.   11/10/2019 Imaging   CT Chest 11/10/19  IMPRESSION: 1. There is a large mass of the lower third of the esophagus, with ill-defined margins, measuring approximately 6.0 x 5.4 x 7.7 cm. This appears enlarged and more appreciably masslike than appearance on prior CT dated 09/14/2019. 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 2. There are prominent gastrohepatic ligament lymph nodes adjacent to the lesser curvature, concerning for nodal metastatic disease although not ideally imaged on this examination of the chest. 3. No evidence of metastatic disease within in the chest. 4. Coronary artery disease.   11/22/2019 PET scan   PET 11/22/19 IMPRESSION: 1. Intensely hypermetabolic (max SUV 84.1) lower thoracic esophageal 6.0 x 5.5 x 9.4 cm  mass extending to the esophagogastric junction with probable involvement of the gastric cardia, compatible with primary esophageal malignancy. 2. Hypermetabolic gastrohepatic ligament nodal metastases. 3. No hypermetabolic liver or other distant metastases. 4. Chronic findings include: Aortic Atherosclerosis (ICD10-I70.0). Coronary atherosclerosis. Marked diffuse colonic diverticulosis.   11/23/2019 Procedure   EGD with Upper Endoscopy by Dr [name] 11/23/19 IMPRESSION - Partially obstructing, likely malignant esophageal tumor was found in the lower third of the esophagus. Biopsied. 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 Biopsied. - Normal duodenal bulb, first portion of the duodenum and second portion of the duodenum.   11/23/2019 Initial Biopsy   FINAL MICROSCOPIC DIAGNOSIS:   A. STOMACH, CARDIA, BIOPSY:  - Invasive well-differentiated squamous cell carcinoma.  See comment   B. ESOPHAGUS, DISTAL, BIOPSY:  - Invasive well-differentiated squamous cell carcinoma.  See comment     COMMENT:   A  B.   Dr. Tresa Moore reviewed the case and concurs with the diagnosis.  Dr. [name] was paged on 11/24/2019.    11/23/2019 Cancer Staging   Staging form: Esophagus - Squamous Cell Carcinoma, AJCC 8th Edition - Clinical stage from 11/23/2019: Stage Unknown (cTX, cN1, cM0) - Signed by [name], MD on 11/26/2019   11/24/2019 Initial Diagnosis   Malignant neoplasm of lower third of esophagus ([place])   12/06/2019 -  Chemotherapy   Concurrent chemoradiation with weekly carboplatin and Taxol starting 12/06/19   12/07/2019 -  Radiation Therapy   Concurrent chemoradiation with Dr Lisbeth Renshaw starting 12/07/19      CURRENT THERAPY:  Concurrent chemoradiation with weekly carboplatin and Taxol starting 12/07/19  INTERVAL HISTORY:  Brett Dawson is here for a follow up and treatment. He presents to the clinic alone.  He notes he is doing well. He notes his RT is going well. He was recently started on Sucralfate  which has helped his swallowing. He notes he stopped PPI as he does not have Acid reflux currently. He notes he is eating most solid foods adequately. He notes he was not able to follow up his surgeon Dr Kipp Brood recently but was not rescheduled.      REVIEW OF SYSTEMS:   Constitutional: Denies fevers, chills (+) Mild weight loss Eyes: Denies blurriness of vision Ears, nose, mouth, throat, and face: Denies mucositis or sore throat (+) Improved Dysphagia  Respiratory: Denies cough, dyspnea or wheezes  Cardiovascular: Denies palpitation, chest discomfort or lower extremity swelling Gastrointestinal:  Denies nausea, heartburn or change in bowel habits Skin: Denies abnormal skin rashes Lymphatics: Denies new lymphadenopathy or easy bruising Neurological:Denies numbness, tingling or new weaknesses Behavioral/Psych: Mood is stable, no new changes  All other systems were reviewed with the patient and are negative.  MEDICAL HISTORY:  Past Medical History:  Diagnosis Date  . Anemia   . Anemia   . Malignant neoplasm of lower third of esophagus ([place]) 11/24/2019    SURGICAL HISTORY: Past Surgical History:  Procedure Laterality Date  . BIOPSY  11/23/2019   Procedure: BIOPSY;  Surgeon: Arta Silence, MD;  Location: [facility];  Service: Endoscopy;;  . ESOPHAGOGASTRODUODENOSCOPY (EGD) WITH PROPOFOL N/A 11/23/2019   Procedure: ESOPHAGOGASTRODUODENOSCOPY (EGD) WITH PROPOFOL;  Surgeon: Arta Silence, MD;  Location: [facility];  Service: Endoscopy;  Laterality: N/A;  . TONSILLECTOMY      I have reviewed the social history and family history with the patient and they are unchanged from previous note.  ALLERGIES:  has No Known Allergies.  MEDICATIONS:  Current Outpatient Medications  Medication Sig Dispense Refill  . Colchicine 0.6 MG CAPS Take 1 capsule by mouth 2 (two) times daily as needed (gout).     . FEROSUL 325 (65 Fe) MG tablet Take 1 tablet (325 mg total) by mouth daily. 30  tablet 2  . ondansetron (ZOFRAN) 8 MG tablet Take 1 tablet (8 mg total) by mouth 2 (two) times daily as needed for refractory nausea / vomiting. Start on day 3 after chemo. 30 tablet 1  . polyethylene glycol (MIRALAX / GLYCOLAX) 17 g packet Take 17 g by mouth daily.    . prochlorperazine (COMPAZINE) 10 MG tablet TAKE 1 TABLET(10 MG) BY MOUTH EVERY 6 HOURS AS NEEDED FOR NAUSEA OR VOMITING 30 tablet 1  . sucralfate (CARAFATE) 1 g tablet Take 1 tablet (1 g total) by mouth 4 (four) times daily. Dissolve each tablet in 15 cc water before use. 120 tablet 2   No current facility-administered medications for this visit.    PHYSICAL EXAMINATION: ECOG PERFORMANCE STATUS: 1 - Symptomatic but completely ambulatory  Vitals:   12/27/19 0903  BP: 107/64  Pulse: 65  Resp: 17  Temp: (!) 97.5 F (36.4 C)  SpO2: 100%   Filed Weights   12/27/19 0903  Weight: 204 lb 3.2 oz (92.6 kg)    Due to COVID19 we will limit examination to appearance. Patient had no complaints.  GENERAL:alert, no distress and comfortable SKIN: skin color normal, no rashes or significant lesions EYES: normal, Conjunctiva are pink and non-injected, sclera clear  NEURO: alert & oriented x 3 with fluent speech   LABORATORY DATA:  I have reviewed the data as listed CBC Latest Ref Rng & Units 12/27/2019 12/20/2019 12/13/2019  WBC 4.0 - 10.5 K/uL 3.3(L) 4.9 9.9  Hemoglobin  13.0 - 17.0 g/dL 10.5(L) 10.4(L) 10.1(L)  Hematocrit 39 - 52 % 33.4(L) 33.0(L) 32.3(L)  Platelets 150 - 400 K/uL 165 249 368     CMP Latest Ref Rng & Units 12/27/2019 12/20/2019 12/13/2019  Glucose 70 - 99 mg/dL 117(H) 116(H) 96  BUN 8 - 23 mg/dL 14 17 13   Creatinine 0.61 - 1.24 mg/dL 0.66 0.68 0.69  Sodium 135 - 145 mmol/L 136 138 136  Potassium 3.5 - 5.1 mmol/L 4.0 4.0 4.3  Chloride 98 - 111 mmol/L 107 105 103  CO2 22 - 32 mmol/L 23 24 25   Calcium 8.9 - 10.3 mg/dL 9.4 9.6 10.0  Total Protein 6.5 - 8.1 g/dL 6.5 6.7 7.4  Total Bilirubin 0.3 - 1.2 mg/dL 0.2(L)  <0.2(L) 0.3  Alkaline Phos 38 - 126 U/L 70 65 61  AST 15 - 41 U/L 32 14(L) 9(L)  ALT 0 - 44 U/L 39 13 8      RADIOGRAPHIC STUDIES: I have personally reviewed the radiological images as listed and agreed with the findings in the report. No results found.   ASSESSMENT & PLAN:  TREMON SAINVIL is a 64 y.o. male with    1.Esophagealsquamous cell carcinoma, in distalesophagus,cTxN1M0 -The patient's work-up began in May 2021 after presenting with symptomatic anemia and syncope. -The patient's colonoscopy and an endoscopy was performed on 11/09/2019. The endoscopy was significant for a large esophageal tumor which is circumferential and non-obstructive around the distal esophagus with invasion into the cardia. The path noted ulcerated, highly atypical squamous cell epithelium. -11/10/19 The patient then had a CT scan of the chest which noted a large mass in the lower third of the esophagus with ill-defined margins, measuring approximately 6.0 x 5.4 x 7.7 cm. The scan also demonstrated prominent gastrohepatic ligament lymph node adjacent to the lesser curvature. -His 11/22/19 PET showed hypermetabolic low thoracic esophageal mass, and hypermetabolic gastrohepatic ligament node,no other node or distant metastasis. -He underwentsecondEGDand biopsyby Dr [name] on 11/23/19. His pathology showed Invasive well-differentiated squamous cell carcinomaof esophaguswhich extend tostomach cardia.  -Patient started concurrent chemoradiation with weekly carboplatin and Taxol on 12/06/19. He has been tolerating well so far  -S/p week 3 his weight is slowly trending down and his swallowing is improving.  -Labs reviewed, CBC and CMP WNL except WBC 3.3, Hg 10.5, BG 117, albumin 3.3. Overall adequate to proceed with week 3 CT today at same dose.  -Continue RT  -Will schedule EUS with Dr. [name] after he completes chemoRT and PET scan 4 weeks after. He will f/u with Surgeon Dr Kipp Brood after chemoRT -Follow-up  weekly   2. Iron deficiency anemia secondary to blood loss -Secondary to #1 -The patient received blood transfusion on 09/27/19 and 1 dose Feraheme on 09/18/19 while in the hospital for symptomatic anemia. Due to persistent low iron and anemia, he received 2 more IV iron doses in 11/2019.  -Anemia now mild and stable.   3. Weight loss and dysphagia -He initially presented with mild-moderate dysphagia. His mass is nonobstructive -The patient reports losing approximately 60 pounds over 6 months since January 2021 -He was seen by dietitian -S/p week 4 his dysphagia is improving and able to eat most solid foods. He will continue sucralfate. His weight is slowly trending down, I encouraged him to continue nutritional supplement and eat enough to maintain weight.     PLAN: -He is tolerating treatment well -Labs reviewed and adequate to proceed with week 4 CT today  -Continue RT  -Follow-up next week -  He will call Dr Kipp Brood to schedule his f/u with him -will send him back to Dr. [name] for EUS after last cycle chemoRT   No problem-specific Assessment & Plan notes found for this encounter.   No orders of the defined types were placed in this encounter.  All questions were answered. The patient knows to call the clinic with any problems, questions or concerns. No barriers to learning was detected. The total time spent in the appointment was 30 minutes.     [name], MD 12/27/2019   I, Joslyn Devon, am acting as scribe for [name], MD.   I have reviewed the above documentation for accuracy and completeness, and I agree with the above.

## 2019-12-27 ENCOUNTER — Other Ambulatory Visit: Payer: Self-pay

## 2019-12-27 ENCOUNTER — Inpatient Hospital Stay: Payer: 59

## 2019-12-27 ENCOUNTER — Inpatient Hospital Stay (HOSPITAL_BASED_OUTPATIENT_CLINIC_OR_DEPARTMENT_OTHER): Payer: 59 | Admitting: Hematology

## 2019-12-27 ENCOUNTER — Inpatient Hospital Stay: Payer: 59 | Admitting: Nutrition

## 2019-12-27 ENCOUNTER — Encounter: Payer: Self-pay | Admitting: Hematology

## 2019-12-27 ENCOUNTER — Ambulatory Visit
Admission: RE | Admit: 2019-12-27 | Discharge: 2019-12-27 | Disposition: A | Discharge status: Home / Self Care | Payer: 59 | Source: Ambulatory Visit | Attending: Radiation Oncology | Admitting: Radiation Oncology

## 2019-12-27 ENCOUNTER — Other Ambulatory Visit: Payer: Self-pay | Admitting: Hematology

## 2019-12-27 VITALS — BP 107/64 | HR 65 | Temp 97.5°F | Resp 17 | Ht 71.0 in | Wt 204.2 lb

## 2019-12-27 DIAGNOSIS — C155 Malignant neoplasm of lower third of esophagus: Secondary | ICD-10-CM

## 2019-12-27 DIAGNOSIS — Z5111 Encounter for antineoplastic chemotherapy: Secondary | ICD-10-CM | POA: Diagnosis not present

## 2019-12-27 LAB — CMP (CANCER CENTER ONLY)
ALT: 39 U/L (ref 0–44)
AST: 32 U/L (ref 15–41)
Albumin: 3.3 g/dL — ABNORMAL LOW (ref 3.5–5.0)
Alkaline Phosphatase: 70 U/L (ref 38–126)
Anion gap: 6 (ref 5–15)
BUN: 14 mg/dL (ref 8–23)
CO2: 23 mmol/L (ref 22–32)
Calcium: 9.4 mg/dL (ref 8.9–10.3)
Chloride: 107 mmol/L (ref 98–111)
Creatinine: 0.66 mg/dL (ref 0.61–1.24)
GFR, Est AFR Am: >60 mL/min (ref >60)
GFR, Estimated: >60 mL/min (ref >60)
Glucose, Bld: 117 mg/dL — ABNORMAL HIGH (ref 70–99)
Potassium: 4 mmol/L (ref 3.5–5.1)
Sodium: 136 mmol/L (ref 135–145)
Total Bilirubin: 0.2 mg/dL — ABNORMAL LOW (ref 0.3–1.2)
Total Protein: 6.5 g/dL (ref 6.5–8.1)

## 2019-12-27 LAB — CBC WITH DIFFERENTIAL (CANCER CENTER ONLY)
Abs Immature Granulocytes: 0.02 10*3/uL (ref 0.00–0.07)
Basophils Absolute: 0 10*3/uL (ref 0.0–0.1)
Basophils Relative: 1 %
Eosinophils Absolute: 0.1 10*3/uL (ref 0.0–0.5)
Eosinophils Relative: 3 %
HCT: 33.4 % — ABNORMAL LOW (ref 39.0–52.0)
Hemoglobin: 10.5 g/dL — ABNORMAL LOW (ref 13.0–17.0)
Immature Granulocytes: 1 %
Lymphocytes Relative: 5 %
Lymphs Abs: 0.2 10*3/uL — ABNORMAL LOW (ref 0.7–4.0)
MCH: 25.8 pg — ABNORMAL LOW (ref 26.0–34.0)
MCHC: 31.4 g/dL (ref 30.0–36.0)
MCV: 82.1 fL (ref 80.0–100.0)
Monocytes Absolute: 0.3 10*3/uL (ref 0.1–1.0)
Monocytes Relative: 10 %
Neutro Abs: 2.7 10*3/uL (ref 1.7–7.7)
Neutrophils Relative %: 80 %
Platelet Count: 165 10*3/uL (ref 150–400)
RBC: 4.07 MIL/uL — ABNORMAL LOW (ref 4.22–5.81)
RDW: 23.3 % — ABNORMAL HIGH (ref 11.5–15.5)
WBC Count: 3.3 10*3/uL — ABNORMAL LOW (ref 4.0–10.5)
nRBC: 0 % (ref 0.0–0.2)

## 2019-12-27 MED ORDER — SODIUM CHLORIDE 0.9 % IV SOLN
300.0000 mg | Freq: Once | INTRAVENOUS | Status: AC
  Administered 2019-12-27: 300 mg via INTRAVENOUS
  Filled 2019-12-27: qty 30

## 2019-12-27 MED ORDER — SODIUM CHLORIDE 0.9 % IV SOLN
20.0000 mg | Freq: Once | INTRAVENOUS | Status: AC
  Administered 2019-12-27: 20 mg via INTRAVENOUS
  Filled 2019-12-27: qty 20

## 2019-12-27 MED ORDER — FAMOTIDINE IN NACL 20-0.9 MG/50ML-% IV SOLN
20.0000 mg | Freq: Once | INTRAVENOUS | Status: AC
  Administered 2019-12-27: 20 mg via INTRAVENOUS

## 2019-12-27 MED ORDER — SODIUM CHLORIDE 0.9 % IV SOLN
Freq: Once | INTRAVENOUS | Status: AC
  Filled 2019-12-27: qty 250

## 2019-12-27 MED ORDER — DIPHENHYDRAMINE HCL 50 MG/ML IJ SOLN
25.0000 mg | Freq: Once | INTRAMUSCULAR | Status: AC
  Administered 2019-12-27: 25 mg via INTRAVENOUS

## 2019-12-27 MED ORDER — SODIUM CHLORIDE 0.9 % IV SOLN
50.0000 mg/m2 | Freq: Once | INTRAVENOUS | Status: AC
  Administered 2019-12-27: 108 mg via INTRAVENOUS
  Filled 2019-12-27: qty 18

## 2019-12-27 MED ORDER — FAMOTIDINE IN NACL 20-0.9 MG/50ML-% IV SOLN
INTRAVENOUS | Status: AC
  Filled 2019-12-27: qty 50

## 2019-12-27 MED ORDER — PALONOSETRON HCL INJECTION 0.25 MG/5ML
INTRAVENOUS | Status: AC
  Filled 2019-12-27: qty 5

## 2019-12-27 MED ORDER — DIPHENHYDRAMINE HCL 50 MG/ML IJ SOLN
INTRAMUSCULAR | Status: AC
  Filled 2019-12-27: qty 1

## 2019-12-27 MED ORDER — PALONOSETRON HCL INJECTION 0.25 MG/5ML
0.2500 mg | Freq: Once | INTRAVENOUS | Status: AC
  Administered 2019-12-27: 0.25 mg via INTRAVENOUS

## 2019-12-27 NOTE — Progress Notes (Signed)
Nutrition follow-up completed with patient during infusion for esophageal cancer. Weight decreased and documented as 204.2 pounds on August 23 down from 206.1 pounds August 16. Patient reports he has a good appetite and he is swallowing without difficulty. He is using sucralfate and it seems to be helping. Noted labs: Glucose 117 on August 23. Patient has increased boost 3 times daily between meals. He denies other nutrition impact symptoms at this time.  Nutrition diagnosis: Unintended weight loss continues.  Intervention: Patient educated to continue strategies for consuming adequate calories and protein by varying textures and temperatures of food as needed. Continue oral nutrition supplements 3 times daily between meals. Continue bowel regimen with increased fluid intake. Questions were answered.  Teach back method used.  Monitoring, evaluation, goals: Patient will continue to work to increase calories and protein to minimize further weight loss.  Next visit: Monday, August 30 during infusion.  **Disclaimer: This note was dictated with voice recognition software. Similar sounding words can inadvertently be transcribed and this note may contain transcription errors which may not have been corrected upon publication of note.**

## 2019-12-27 NOTE — Patient Instructions (Signed)
[facility] Discharge Instructions for Patients Receiving Chemotherapy  Today you received the following chemotherapy agents: Taxol/Carbo  To help prevent nausea and vomiting after your treatment, we encourage you to take your nausea medication as directed.   If you develop nausea and vomiting that is not controlled by your nausea medication, call the clinic.   BELOW ARE SYMPTOMS THAT SHOULD BE REPORTED IMMEDIATELY:  *FEVER GREATER THAN 100.5 F  *CHILLS WITH OR WITHOUT FEVER  NAUSEA AND VOMITING THAT IS NOT CONTROLLED WITH YOUR NAUSEA MEDICATION  *UNUSUAL SHORTNESS OF BREATH  *UNUSUAL BRUISING OR BLEEDING  TENDERNESS IN MOUTH AND THROAT WITH OR WITHOUT PRESENCE OF ULCERS  *URINARY PROBLEMS  *BOWEL PROBLEMS  UNUSUAL RASH Items with * indicate a potential emergency and should be followed up as soon as possible.  Feel free to call the clinic should you have any questions or concerns. The clinic phone number is [phone].  Please show the CHEMO ALERT CARD at check-in to the Emergency Department and triage nurse.   

## 2019-12-28 ENCOUNTER — Other Ambulatory Visit: Payer: Self-pay | Admitting: *Deleted

## 2019-12-28 ENCOUNTER — Encounter: Payer: Self-pay | Admitting: *Deleted

## 2019-12-28 ENCOUNTER — Telehealth: Payer: Self-pay

## 2019-12-28 ENCOUNTER — Other Ambulatory Visit: Payer: Self-pay

## 2019-12-28 ENCOUNTER — Ambulatory Visit
Admission: RE | Admit: 2019-12-28 | Discharge: 2019-12-28 | Disposition: A | Discharge status: Home / Self Care | Payer: 59 | Source: Ambulatory Visit | Attending: Radiation Oncology | Admitting: Radiation Oncology

## 2019-12-28 DIAGNOSIS — Z5111 Encounter for antineoplastic chemotherapy: Secondary | ICD-10-CM | POA: Diagnosis not present

## 2019-12-28 NOTE — Patient Outreach (Signed)
[facility]) Care Management  12/28/2019  Brett Dawson Mar 19, 1956 [id number]   [place] coordination- Boost coupons  Collaboration with [facility] CMA, Thea Alken to have patient sent Boost coupons via [employer] L. Lavina Hamman, RN, BSN, [place] Coordinator Office number [phone] Mobile number [phone]  Main [facility] number [phone] Fax number [phone]

## 2019-12-28 NOTE — Patient Outreach (Addendum)
[facility]) Care Management  12/28/2019  Brett Dawson 03-21-1956 [id number]   THNFollow upTelephone Assessment/Screen forthisBright Health insurancereferral  MrEricG Dawson was referred to [facility] on 11/24/19 Referral Source:[employer] Insurance Referral Reason:esophageal tumor with invasion into cradia  Insurance:[employer] Admissions x1in last 6 months  11/23/19 EGD with upper endoscopy Dr Dulce Sellar 7/16/21AnotherFeraheme dosegiven Admission 09/17/19 to 09/19/19 syncopeHgb 6.7 given blood+ Feraheme on 09/18/19 dxsystemic iron deficiency anemia observed and Hgb increased to 7.7 followed by [facility] gastroenterology    Follow up assessment after second oncology treatment Patient is able to verify HIPAA [facility] Portability and Accountability Act) identifiers, date of birth (DOB) and address Reviewed and addressed the purpose of the follow up call with the patient  Consent: [facility]) RN CM reviewed [facility] services with patient. Patient gave verbal consent for services.  Assessment of symptoms after treatments He reports he continues to cope well  He reports the only recent symptoms since the last [facility] RN CM outreach was nausea and was resolved with Zofran He reports this occurred on 12/24/19 He reports feeling better on 12/25/19 He reports also noted chest discomfort "sore when swallowing" , "felt like I had something in my throat" He denies shortness of breath (sob), presyncope, or syncope He is tolerating radiation treatments well  He had a follow up with Dr Mosetta Putt on 12/27/19 and will follow up again next week  He denies fatigue as he is aware of his increasing Hemoglobin A1C (HgA1c) of 10.5 Hemoglobin & Hematocrit     Component Value Date/Time   HGB 10.5 (L) 12/27/2019 0810   HCT 33.4 (L) 12/27/2019 0810   He is aware he will be sent back to see Dr Dulce Sellar for and Esophageal ultrasound after the last September 2021  chemo/radiation treatment Nutrition He continues to take boost tid as recommended by Dr Mosetta Putt He reports taking one at breakfast, around 2 pm and then around 8 pm   Falls No further falls   [facility] RN CM intervention [facility] RN CM discussed the mailed boost coupons sent to him today with assist of [facility] CMA He was encouraged to updated [facility] RN CM of need of more prn  Outreach conference with Brett Trucks to Dr Cliffton Asters office [phone]) to get assist with scheduling his return visit after his radiation is completed in September 2021 He is to have a repeat PET/CT 4 weeks after his 6 weeks of neoadjuvant chemo radiation treatments Spoke with Darl Pikes who transferred the call to Dr Lucilla Lame nurse.  A message was left for the nurse to return a call to Brett Jennette   Social:Brett BESSIE Dawson is a 64 year old male who continues to drive and work in Animal nutritionist at [employer]. He lives alone and isindependent with all careHe has a brother Brett Dawson who assists at intervals. His co workers are very supportive He has Dentist in [employer] He states he can get to all of his medical appointments independently He denies needs with social determinants such as housing, finances etc  He was encouraged to please updated [facility] RN CM if there are changes in needs with social determinants  Conditions:Largefat containinghiatal herniathat was found to beEsophageal masswith invasion into cradia-approximately 6.0 x 5.4 x 7.7 cmafterbiopsies -followed by Dr Adrian Saran as he is dx with malignant neoplasm of lower third of esophagus-squamous cell carcinoma, 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, cN1, cM0) , hepatic and bilateral simple renal cysts, colonic diverticulosis, dysphagia,60 lb weight  loss in 6 months since January 2021, 5/14/21syncope leading to systemic iron deficiency anemia- reports dx as anemic as a child,history of alcohol abuse,(pt now states2-3 beers per week was 3-4 qd) gout   Fellwith syncope on 09/17/19 withno injury He was entering a bar  Plans [facility] RN CM will follow up with Brett Dawson within the next 14-21 business days Pt encouraged to return a call to [facility] RN CM prn Routed note to MD Goals Addressed              This Visit's Progress     Patient Stated   .  Patient will be able to manage his health needs (including weight loss, fall prevention) at home related to treatments for esophageal cancer (pt-stated)        CARE PLAN ENTRY (see longitudinal plan of care for additional care plan information)  Current Barriers:  [place] Knowledge Deficits related to fall precautions . Knowledge Deficits related to esophageal cancer, weight loss and side effects of cancer treatment  . Cognitive Deficits  Clinical Goal(s):  Marland Kitchen Over the next 45 days, patient will not experience additional falls Last fall Sep 17 2019 12/28/19 Continues without falls . Over the next 90 days, patient will be able to maintain his weight and report any abnormal or worsening symptoms to MDs during and after cancer treatment- Still reported with some weight loss and he has been encouraged to increase the boost intake from bid to tid by Dr Mosetta Putt per pt -12/28/19 Pt continues with boost tid, doing well and was mailed boost coupons by [facility] CMA  Interventions:   . Assessed for falls since last encounter. . Assessed patients knowledge of fall risk prevention secondary to previously provided education. . Follow up on his coping and home management after medical appointments and treatments . Assess & Encouraged good nutritional intake to include intake of Boost supplements . Assess for weight changes . Answered questions about cancer staging, food intake, food texture . Assessed for dental care and needs . Discussed the importance of monitoring and reporting any bleeding, sores, pain or swelling of his teeth to his pcp or oncologist [facility] RN CM discussed minimizing risks of infection  . Chemotherapy,  esophageal cancer, nausea and vomiting with cancer treatment, diet after mouth or throat surgery, soft diet EMMI materials sent via e-mail . Assessed for social determinants of health needs and encouraged patient to outreach if needs arise . Sent further EMMI materials on stages of cancer, low purine diet, home treatments for constipation when you have cancer, coughing techniques and barrett's esophagus discharge instructions via his e-mail address . Boost coupons sent via mail on 12/28/19 with collaboration of [facility] CMA, Adelina Mings . 12/28/19 Conferenced with him to thoracic surgeon office to assist with scheduling appointment to be seen after radiation completed   Patient Self Care Activities:  . De-clutter walkways . Change positions slowly . Wear secure fitting shoes at all times with ambulation . Utilize home lighting for dim lit areas . Have self and pet awareness at all times . Reports any bleeding, sores, pain or swelling of his teeth to his pcp or oncologist  . Eats soft foods and take in nutritional supplements (Boost)-increase intake from bid to tid prn . Maintains good oral care  . Follow up with MD about cancer staging . Night cough, breathing treatments  . Make an appointment with pcp . Check on inhaler prescription . Review EMMI education materials   Plan: . CCM RN CM  will follow up within the next 14-21 business days to see how he is doing with his oncology treatments, evaluate for care coordination and disease management needs    Please see past updates related to this goal by clicking on the "Past Updates" button in the selected goal  Please see other previous [facility] care plan information listed in Epic under the flow sheet section           Lyrica Mcclarty L. Noelle Penner, RN, BSN, CCM [facility] Telephonic Care Management Care Coordinator Office number [phone] Mobile number [phone]  Main [facility] number [phone] Fax number [phone]

## 2019-12-28 NOTE — Telephone Encounter (Signed)
Attempted to contact Mr. Lady back in regards to follow-up appointment with Dr. Kipp Brood after Chemo/Rad.  Unable to leave voicemail message as voicemail box was full.

## 2019-12-29 ENCOUNTER — Telehealth: Payer: Self-pay | Admitting: Hematology

## 2019-12-29 ENCOUNTER — Ambulatory Visit
Admission: RE | Admit: 2019-12-29 | Discharge: 2019-12-29 | Disposition: A | Discharge status: Home / Self Care | Payer: 59 | Source: Ambulatory Visit | Attending: Radiation Oncology | Admitting: Radiation Oncology

## 2019-12-29 ENCOUNTER — Other Ambulatory Visit: Payer: Self-pay

## 2019-12-29 DIAGNOSIS — Z5111 Encounter for antineoplastic chemotherapy: Secondary | ICD-10-CM | POA: Diagnosis not present

## 2019-12-29 NOTE — Telephone Encounter (Signed)
No 8/23 los

## 2019-12-30 ENCOUNTER — Other Ambulatory Visit: Payer: Self-pay

## 2019-12-30 ENCOUNTER — Ambulatory Visit
Admission: RE | Admit: 2019-12-30 | Discharge: 2019-12-30 | Disposition: A | Discharge status: Home / Self Care | Payer: 59 | Source: Ambulatory Visit | Attending: Radiation Oncology | Admitting: Radiation Oncology

## 2019-12-30 DIAGNOSIS — Z5111 Encounter for antineoplastic chemotherapy: Secondary | ICD-10-CM | POA: Diagnosis not present

## 2019-12-31 ENCOUNTER — Other Ambulatory Visit: Payer: Self-pay

## 2019-12-31 ENCOUNTER — Ambulatory Visit
Admission: RE | Admit: 2019-12-31 | Discharge: 2019-12-31 | Disposition: A | Discharge status: Home / Self Care | Payer: 59 | Source: Ambulatory Visit | Attending: Radiation Oncology | Admitting: Radiation Oncology

## 2019-12-31 DIAGNOSIS — Z5111 Encounter for antineoplastic chemotherapy: Secondary | ICD-10-CM | POA: Diagnosis not present

## 2020-01-01 NOTE — Progress Notes (Signed)
[facility]   [phone] [phone]   Clinic Follow up Note   Patient Care Team: [name], MD as PCP - General (Family Medicine) [name], MD as Consulting Physician (Oncology) Jonnie Finner, RN as Registered Nurse Otis Brace, MD as Consulting Physician (Gastroenterology) Barbaraann Faster, RN as [place] [facility] Karie Mainland, RD as Dietitian (Nutrition) Lajuana Matte, MD as Consulting Physician (Cardiothoracic Surgery) Arta Silence, MD as Consulting Physician (Gastroenterology) 01/03/2020  CHIEF COMPLAINT: F/u esophagus cancer   SUMMARY OF ONCOLOGIC HISTORY: Oncology History Overview Note  Cancer Staging Malignant neoplasm of lower third of esophagus [facility]) Staging form: Esophagus - Squamous Cell Carcinoma, AJCC 8th Edition - Clinical stage from 11/23/2019: Stage Unknown (cTX, cN1, cM0) - Signed by [name], MD on 11/26/2019     Malignant neoplasm of lower third of esophagus ([place])  09/14/2019 Imaging   CT AP W Contrast 09/14/19 IMPRESSION: 1. Large hiatal hernia. 2. Hepatic and bilateral simple renal cysts. 3. Colonic diverticulosis. 4. Small fat containing left inguinal hernia.   11/10/2019 Imaging   CT Chest 11/10/19  IMPRESSION: 1. There is a large mass of the lower third of the esophagus, with ill-defined margins, measuring approximately 6.0 x 5.4 x 7.7 cm. This appears enlarged and more appreciably masslike than appearance on prior CT dated 09/14/2019. 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 2. There are prominent gastrohepatic ligament lymph nodes adjacent to the lesser curvature, concerning for nodal metastatic disease although not ideally imaged on this examination of the chest. 3. No evidence of metastatic disease within in the chest. 4. Coronary artery disease.   11/22/2019 PET scan   PET 11/22/19 IMPRESSION: 1. Intensely hypermetabolic (max SUV 31.4)  lower thoracic esophageal 6.0 x 5.5 x 9.4 cm mass extending to the esophagogastric junction with probable involvement of the gastric cardia, compatible with primary esophageal malignancy. 2. Hypermetabolic gastrohepatic ligament nodal metastases. 3. No hypermetabolic liver or other distant metastases. 4. Chronic findings include: Aortic Atherosclerosis (ICD10-I70.0). Coronary atherosclerosis. Marked diffuse colonic diverticulosis.   11/23/2019 Procedure   EGD with Upper Endoscopy by Dr [name] 11/23/19 IMPRESSION - Partially obstructing, likely malignant esophageal tumor was found in the lower third of the esophagus. Biopsied. 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 Biopsied. - Normal duodenal bulb, first portion of the duodenum and second portion of the duodenum.   11/23/2019 Initial Biopsy   FINAL MICROSCOPIC DIAGNOSIS:   A. STOMACH, CARDIA, BIOPSY:  - Invasive well-differentiated squamous cell carcinoma.  See comment   B. ESOPHAGUS, DISTAL, BIOPSY:  - Invasive well-differentiated squamous cell carcinoma.  See comment     COMMENT:   A  B.   Dr. Tresa Moore reviewed the case and concurs with the diagnosis.  Dr. [name] was paged on 11/24/2019.    11/23/2019 Cancer Staging   Staging form: Esophagus - Squamous Cell Carcinoma, AJCC 8th Edition - Clinical stage from 11/23/2019: Stage Unknown (cTX, cN1, cM0) - Signed by [name], MD on 11/26/2019   11/24/2019 Initial Diagnosis   Malignant neoplasm of lower third of esophagus ([place])   12/06/2019 -  Chemotherapy   Concurrent chemoradiation with weekly carboplatin and Taxol starting 12/06/19   12/07/2019 -  Radiation Therapy   Concurrent chemoradiation with Dr Lisbeth Renshaw starting 12/07/19     CURRENT THERAPY: Concurrent chemoRT with taxol and carboplatin starting 12/07/19  INTERVAL HISTORY: Mr. Bencomo returns for f/u and treatment as scheduled. He continues ccRT with taxol and carboplatin.  He is  doing very well.  Remains able to eat a solid and  regular diet as long as he eats small portions and chews well.  No odynophagia with eating.  Has mild esophageal pain with clearing phlegm in the morning.  Phlegm is clear, no fever, chills, cough, chest pain, dyspnea.  He has mild nausea, resolves with medication.  No mucositis.  Able to eat and drink well.  No constipation, diarrhea, bleeding, rash, or neuropathy.   MEDICAL HISTORY:  Past Medical History:  Diagnosis Date  . Anemia   . Anemia   . Malignant neoplasm of lower third of esophagus ([place]) 11/24/2019    SURGICAL HISTORY: Past Surgical History:  Procedure Laterality Date  . BIOPSY  11/23/2019   Procedure: BIOPSY;  Surgeon: Arta Silence, MD;  Location: [facility];  Service: Endoscopy;;  . ESOPHAGOGASTRODUODENOSCOPY (EGD) WITH PROPOFOL N/A 11/23/2019   Procedure: ESOPHAGOGASTRODUODENOSCOPY (EGD) WITH PROPOFOL;  Surgeon: Arta Silence, MD;  Location: [facility];  Service: Endoscopy;  Laterality: N/A;  . TONSILLECTOMY      I have reviewed the social history and family history with the patient and they are unchanged from previous note.  ALLERGIES:  has No Known Allergies.  MEDICATIONS:  Current Outpatient Medications  Medication Sig Dispense Refill  . Colchicine 0.6 MG CAPS Take 1 capsule by mouth 2 (two) times daily as needed (gout).     . FEROSUL 325 (65 Fe) MG tablet Take 1 tablet (325 mg total) by mouth daily. 30 tablet 2  . ondansetron (ZOFRAN) 8 MG tablet Take 1 tablet (8 mg total) by mouth 2 (two) times daily as needed for refractory nausea / vomiting. Start on day 3 after chemo. 30 tablet 1  . polyethylene glycol (MIRALAX / GLYCOLAX) 17 g packet Take 17 g by mouth daily.    . prochlorperazine (COMPAZINE) 10 MG tablet TAKE 1 TABLET(10 MG) BY MOUTH EVERY 6 HOURS AS NEEDED FOR NAUSEA OR VOMITING 30 tablet 1  . sucralfate (CARAFATE) 1 g tablet Take 1 tablet (1 g total) by mouth 4 (four) times daily. Dissolve each tablet in 15 cc water before use. 120 tablet 2   No  current facility-administered medications for this visit.   Facility-Administered Medications Ordered in Other Visits  Medication Dose Route Frequency Provider Last Rate Last Admin  . dexamethasone (DECADRON) 20 mg in sodium chloride 0.9 % 50 mL IVPB  20 mg Intravenous Once [name], MD      . famotidine (PEPCID) IVPB 20 mg premix  20 mg Intravenous Once [name], MD 200 mL/hr at 01/03/20 1041 20 mg at 01/03/20 1041  . PACLitaxel (TAXOL) 108 mg in sodium chloride 0.9 % 250 mL chemo infusion (</= 80mg /m2)  50 mg/m2 (Treatment Plan Recorded) Intravenous Once [name], MD        PHYSICAL EXAMINATION: ECOG PERFORMANCE STATUS: 0 - Asymptomatic  Vitals:   01/03/20 0900  BP: 121/88  Pulse: 83  Resp: 18  Temp: (!) 97.2 F (36.2 C)  SpO2: 100%   Filed Weights   01/03/20 0900  Weight: 208 lb 8 oz (94.6 kg)    GENERAL:alert, no distress and comfortable SKIN: No rash to exposed skin, no erythema to treatment field EYES: sclera clear LUNGS:  normal breathing effort HEART:  no lower extremity edema NEURO: alert & oriented x 3 with fluent speech, normal gait  LABORATORY DATA:  I have reviewed the data as listed CBC Latest Ref Rng & Units 01/03/2020 12/27/2019 12/20/2019  WBC 4.0 - 10.5 K/uL 2.5(L)  3.3(L) 4.9  Hemoglobin 13.0 - 17.0 g/dL 10.0(L) 10.5(L) 10.4(L)  Hematocrit 39 - 52 % 31.0(L) 33.4(L) 33.0(L)  Platelets 150 - 400 K/uL 95(L) 165 249     CMP Latest Ref Rng & Units 01/03/2020 12/27/2019 12/20/2019  Glucose 70 - 99 mg/dL 127(H) 117(H) 116(H)  BUN 8 - 23 mg/dL 12 14 17   Creatinine 0.61 - 1.24 mg/dL 0.63 0.66 0.68  Sodium 135 - 145 mmol/L 140 136 138  Potassium 3.5 - 5.1 mmol/L 3.9 4.0 4.0  Chloride 98 - 111 mmol/L 106 107 105  CO2 22 - 32 mmol/L 27 23 24   Calcium 8.9 - 10.3 mg/dL 9.4 9.4 9.6  Total Protein 6.5 - 8.1 g/dL 6.1(L) 6.5 6.7  Total Bilirubin 0.3 - 1.2 mg/dL 0.2(L) 0.2(L) <0.2(L)  Alkaline Phos 38 - 126 U/L 64 70 65  AST 15 - 41 U/L 29 32 14(L)  ALT 0 - 44 U/L  34 39 13      RADIOGRAPHIC STUDIES: I have personally reviewed the radiological images as listed and agreed with the findings in the report. No results found.   ASSESSMENT & PLAN: GORDAN GRELL a 64 y.o.malewith    1.Esophagealsquamous cell carcinoma, in distalesophagus,cTxN1M0 -He presented with symptomatic anemia and syncope in 09/2019 and 60 pound weight loss since 05/2019.Endoscopy on 11/09/2019 significant for a largedistalesophageal tumor with invasion into the cardia. The path noted ulcerated, highly atypical squamous cell epithelium. -11/10/19 CT noted a large mass in the lower third of the esophagus measuring approximately 6.0 x 5.4 x 7.7 cmwithprominent gastrohepatic ligament lymph node adjacent to the lesser curvature. -7/[id number] hypermetabolic low thoracic esophageal masswith probable involvement inthe gastric cardia, and hypermetabolic gastrohepatic ligament node,no other node or distant metastasis. -He underwentsecondEGDand biopsyby Dr [name] on 11/23/19.Pathology showed Invasive well-differentiated squamous cell carcinomaof esophaguswhich extend tostomach cardia. -He was seen bycardiothoracicsurgeon Dr. Kipp Brood on 12/03/2019 who feels this is potentially resectable in the event he is able to maintain his weight (goal >190) during ccRT. He will require post therapy EGD with EUS to ensure no mediastinal invasion given the size of his tumor and repeat PET/CT 4 weeks after completing neoadjuvant treatment at which time he will discuss surgical options with the patient. -For his locally advanced esophageal squamous cell carcinoma, he beganneoadjuvantconcurrent chemoradiation with Taxol/carbo with curative intenton 8/2 (chemo starting 12/07/19)  2. Iron deficiency anemia secondary to blood loss -Secondary to #1 -S/pblood transfusion on 09/27/19 andIV Feraheme on5/15/21 while in the hospital for symptomatic anemia, then again on 7/16 and  11/26/2019. -Hgb improved, in 10 range on chemo, ferritin 183 on 12/07/19 much improved   3. Weight loss and dysphagia -He initially presented with mild-moderate dysphagia. His mass is nonobstructive -The patient reports losing approximately 60 pounds over 6 months since January 2021 -Followed by dietitian -Weight stable on a normal diet, mild dysphagia without odynophagia  4. Alcohol use  -The patient has a prior history of alcohol abuse.  -He has cut back significantly, again encouraged to stop altogether   Disposition: Mr. Burley appears stable.  He has completed 4 weeks of ccRT with Taxol and carboplatin.  He tolerates treatment well without significant side effects.  He has no significant dysphagia or odynophagia with normal diet, he has gained weight.  Overall tolerating treatment well.  We reviewed the CBC and CMP from today.  He will proceed with week 5 Taxol today, we are holding carboplatin due to moderate thrombocytopenia.  He will continue RT, follow-up next week for final chemo  if he continues to tolerate well.    Anticipate completing treatment on 9/9. He will need repeat EUS (Dr. [name]) and PET approx 4 weeks after completing chemoRT and f/u with Dr. Kipp Brood.   All questions were answered. The patient knows to call the clinic with any problems, questions or concerns. No barriers to learning were detected.     Alla Feeling, NP 01/03/20

## 2020-01-03 ENCOUNTER — Encounter: Payer: Self-pay | Admitting: Nurse Practitioner

## 2020-01-03 ENCOUNTER — Inpatient Hospital Stay: Payer: 59

## 2020-01-03 ENCOUNTER — Inpatient Hospital Stay: Payer: 59 | Admitting: Nutrition

## 2020-01-03 ENCOUNTER — Inpatient Hospital Stay (HOSPITAL_BASED_OUTPATIENT_CLINIC_OR_DEPARTMENT_OTHER): Payer: 59 | Admitting: Nurse Practitioner

## 2020-01-03 ENCOUNTER — Other Ambulatory Visit: Payer: Self-pay

## 2020-01-03 ENCOUNTER — Ambulatory Visit
Admission: RE | Admit: 2020-01-03 | Discharge: 2020-01-03 | Disposition: A | Discharge status: Home / Self Care | Payer: 59 | Source: Ambulatory Visit | Attending: Radiation Oncology | Admitting: Radiation Oncology

## 2020-01-03 VITALS — BP 121/88 | HR 83 | Temp 97.2°F | Resp 18 | Ht 71.0 in | Wt 208.5 lb

## 2020-01-03 DIAGNOSIS — C155 Malignant neoplasm of lower third of esophagus: Secondary | ICD-10-CM

## 2020-01-03 DIAGNOSIS — Z5111 Encounter for antineoplastic chemotherapy: Secondary | ICD-10-CM | POA: Diagnosis not present

## 2020-01-03 LAB — CBC WITH DIFFERENTIAL (CANCER CENTER ONLY)
Abs Immature Granulocytes: 0.03 10*3/uL (ref 0.00–0.07)
Basophils Absolute: 0 10*3/uL (ref 0.0–0.1)
Basophils Relative: 1 %
Eosinophils Absolute: 0.1 10*3/uL (ref 0.0–0.5)
Eosinophils Relative: 3 %
HCT: 31 % — ABNORMAL LOW (ref 39.0–52.0)
Hemoglobin: 10 g/dL — ABNORMAL LOW (ref 13.0–17.0)
Immature Granulocytes: 1 %
Lymphocytes Relative: 4 %
Lymphs Abs: 0.1 10*3/uL — ABNORMAL LOW (ref 0.7–4.0)
MCH: 26.7 pg (ref 26.0–34.0)
MCHC: 32.3 g/dL (ref 30.0–36.0)
MCV: 82.9 fL (ref 80.0–100.0)
Monocytes Absolute: 0.3 10*3/uL (ref 0.1–1.0)
Monocytes Relative: 10 %
Neutro Abs: 2 10*3/uL (ref 1.7–7.7)
Neutrophils Relative %: 81 %
Platelet Count: 95 10*3/uL — ABNORMAL LOW (ref 150–400)
RBC: 3.74 MIL/uL — ABNORMAL LOW (ref 4.22–5.81)
RDW: 23.9 % — ABNORMAL HIGH (ref 11.5–15.5)
WBC Count: 2.5 10*3/uL — ABNORMAL LOW (ref 4.0–10.5)
nRBC: 0 % (ref 0.0–0.2)

## 2020-01-03 LAB — CMP (CANCER CENTER ONLY)
ALT: 34 U/L (ref 0–44)
AST: 29 U/L (ref 15–41)
Albumin: 3.1 g/dL — ABNORMAL LOW (ref 3.5–5.0)
Alkaline Phosphatase: 64 U/L (ref 38–126)
Anion gap: 7 (ref 5–15)
BUN: 12 mg/dL (ref 8–23)
CO2: 27 mmol/L (ref 22–32)
Calcium: 9.4 mg/dL (ref 8.9–10.3)
Chloride: 106 mmol/L (ref 98–111)
Creatinine: 0.63 mg/dL (ref 0.61–1.24)
GFR, Est AFR Am: >60 mL/min (ref >60)
GFR, Estimated: >60 mL/min (ref >60)
Glucose, Bld: 127 mg/dL — ABNORMAL HIGH (ref 70–99)
Potassium: 3.9 mmol/L (ref 3.5–5.1)
Sodium: 140 mmol/L (ref 135–145)
Total Bilirubin: 0.2 mg/dL — ABNORMAL LOW (ref 0.3–1.2)
Total Protein: 6.1 g/dL — ABNORMAL LOW (ref 6.5–8.1)

## 2020-01-03 MED ORDER — DIPHENHYDRAMINE HCL 50 MG/ML IJ SOLN
25.0000 mg | Freq: Once | INTRAMUSCULAR | Status: AC
  Administered 2020-01-03: 25 mg via INTRAVENOUS

## 2020-01-03 MED ORDER — SODIUM CHLORIDE 0.9 % IV SOLN
50.0000 mg/m2 | Freq: Once | INTRAVENOUS | Status: AC
  Administered 2020-01-03: 108 mg via INTRAVENOUS
  Filled 2020-01-03: qty 18

## 2020-01-03 MED ORDER — FAMOTIDINE IN NACL 20-0.9 MG/50ML-% IV SOLN
20.0000 mg | Freq: Once | INTRAVENOUS | Status: AC
  Administered 2020-01-03: 20 mg via INTRAVENOUS

## 2020-01-03 MED ORDER — SODIUM CHLORIDE 0.9 % IV SOLN
20.0000 mg | Freq: Once | INTRAVENOUS | Status: AC
  Administered 2020-01-03: 20 mg via INTRAVENOUS
  Filled 2020-01-03: qty 20

## 2020-01-03 MED ORDER — SODIUM CHLORIDE 0.9 % IV SOLN
Freq: Once | INTRAVENOUS | Status: AC
  Filled 2020-01-03: qty 250

## 2020-01-03 MED ORDER — DIPHENHYDRAMINE HCL 50 MG/ML IJ SOLN
INTRAMUSCULAR | Status: AC
  Filled 2020-01-03: qty 1

## 2020-01-03 MED ORDER — PALONOSETRON HCL INJECTION 0.25 MG/5ML
INTRAVENOUS | Status: AC
  Filled 2020-01-03: qty 5

## 2020-01-03 MED ORDER — PALONOSETRON HCL INJECTION 0.25 MG/5ML
0.2500 mg | Freq: Once | INTRAVENOUS | Status: AC
  Administered 2020-01-03: 0.25 mg via INTRAVENOUS

## 2020-01-03 MED ORDER — FAMOTIDINE IN NACL 20-0.9 MG/50ML-% IV SOLN
INTRAVENOUS | Status: AC
  Filled 2020-01-03: qty 50

## 2020-01-03 NOTE — Progress Notes (Signed)
Nutrition follow-up completed with patient during infusion for esophageal cancer. Weight improved and was documented as 208.5 pounds up from 204.2 pounds August 23. Patient continues to have a good appetite and denies difficulty swallowing. Labs were reviewed. Patient continues boost 3 times daily between meals. Denies nutrition impact symptoms.  Nutrition diagnosis: Unintended weight loss improved.  Intervention: Patient educated to continue strategies for increased calories and protein as tolerated. Continue boost 3 times daily between meals. Provided support and encouragement.  Monitoring, evaluation, goals: Patient will continue to work to increase calories and protein to minimize weight loss.  Next visit: Tuesday, September 7 during infusion.  **Disclaimer: This note was dictated with voice recognition software. Similar sounding words can inadvertently be transcribed and this note may contain transcription errors which may not have been corrected upon publication of note.**

## 2020-01-03 NOTE — Patient Instructions (Addendum)
[facility] Discharge Instructions for Patients Receiving Chemotherapy  Today you received the following chemotherapy agents: Paclitaxel  To help prevent nausea and vomiting after your treatment, we encourage you to take your nausea medication  as prescribed.    If you develop nausea and vomiting that is not controlled by your nausea medication, call the clinic.   BELOW ARE SYMPTOMS THAT SHOULD BE REPORTED IMMEDIATELY:  *FEVER GREATER THAN 100.5 F  *CHILLS WITH OR WITHOUT FEVER  NAUSEA AND VOMITING THAT IS NOT CONTROLLED WITH YOUR NAUSEA MEDICATION  *UNUSUAL SHORTNESS OF BREATH  *UNUSUAL BRUISING OR BLEEDING  TENDERNESS IN MOUTH AND THROAT WITH OR WITHOUT PRESENCE OF ULCERS  *URINARY PROBLEMS  *BOWEL PROBLEMS  UNUSUAL RASH Items with * indicate a potential emergency and should be followed up as soon as possible.  Feel free to call the clinic should you have any questions or concerns. The clinic phone number is [phone].  Please show the [facility] at check-in to the Emergency Department and triage nurse.   Thrombocytopenia Thrombocytopenia means that you have a low number of platelets in your blood. Platelets are tiny cells in the blood. When you bleed, they clump together at the cut or injury to stop the bleeding. This is called blood clotting. If you do not have enough platelets, it can cause bleeding problems. Some cases of this condition are mild while others are more severe. What are the causes? This condition may be caused by:  Your body not making enough platelets. This may be caused by: ? Your bone marrow not making blood cells (aplastic anemia). ? Cancer in the bone marrow. ? Certain medicines. ? Infection in the bone marrow. ? Drinking a lot of alcohol.  Your body destroying platelets too quickly. This may be caused by: ? Certain immune diseases. ? Certain medicines. ? Certain blood clotting disorders. ? Certain disorders that  are passed from parent to child (inherited). ? Certain bleeding disorders. ? Pregnancy. ? Having a spleen that is larger than normal. What are the signs or symptoms?  Bleeding that is not normal.  Nosebleeds.  Heavy menstrual periods.  Blood in the pee (urine) or poop (stool).  A purple-like color to the skin (purpura).  Bruising.  A rash that looks like pinpoint, purple-red spots (petechiae). How is this treated?  Treatment of another condition that is causing the low platelet count.  Medicines to help protect your platelets from being destroyed.  A replacement (transfusion) of platelets to stop or prevent bleeding.  Surgery to remove the spleen. Follow these instructions at home: Activity  Avoid activities that could cause you to get hurt or bruised. Follow instructions about how to prevent falls.  Take care not to cut yourself: ? When you shave. ? When you use scissors, needles, knives, or other tools.  Take care not to burn yourself: ? When you use an iron. ? When you cook. General instructions   Check your skin and the inside of your mouth for bruises or blood as told by your doctor.  Check to see if there is blood in your spit (sputum), pee, and poop. Do this as told by your doctor.  Do not drink alcohol.  Take over-the-counter and prescription medicines only as told by your doctor.  Do not take any medicines that have aspirin or NSAIDs in them. These medicines can thin your blood and cause you to bleed.  Tell all of your doctors that you have this condition. Be sure  to tell your dentist and eye doctor too. Contact a doctor if:  You have bruises and you do not know why. Get help right away if:  You are bleeding anywhere on your body.  You have blood in your spit, pee, or poop. Summary  Thrombocytopenia means that you have a low number of platelets in your blood.  Platelets are needed for blood clotting.  Symptoms of this condition include  bleeding that is not normal, and bruising.  Take care not to cut or burn yourself. This information is not intended to replace advice given to you by your health care provider. Make sure you discuss any questions you have with your health care provider. Document Revised: 01/22/2018 Document Reviewed: 01/22/2018 Elsevier Patient Education  [place].

## 2020-01-04 ENCOUNTER — Other Ambulatory Visit: Payer: Self-pay | Admitting: *Deleted

## 2020-01-04 ENCOUNTER — Encounter: Payer: Self-pay | Admitting: *Deleted

## 2020-01-04 ENCOUNTER — Other Ambulatory Visit: Payer: Self-pay

## 2020-01-04 ENCOUNTER — Ambulatory Visit
Admission: RE | Admit: 2020-01-04 | Discharge: 2020-01-04 | Disposition: A | Discharge status: Home / Self Care | Payer: 59 | Source: Ambulatory Visit | Attending: Radiation Oncology | Admitting: Radiation Oncology

## 2020-01-04 DIAGNOSIS — Z5111 Encounter for antineoplastic chemotherapy: Secondary | ICD-10-CM | POA: Diagnosis not present

## 2020-01-04 NOTE — Patient Outreach (Signed)
[facility]) Care Management  01/04/2020  QUARTEZ BRADEEN 06-Mar-1956 [id number]   THNFollow upTelephone Assessment/Screen forthisBright Health insurancereferral  MrEricG Flaugher was referred to [facility] on 11/24/19 Referral Source:[facility] Insurance Referral Reason:esophageal tumor with invasion into cardia  Insurance:[facility] Admissions x1in last 6 months  11/23/19 EGD with upper endoscopy Dr Dulce Sellar 7/16/21AnotherFeraheme dosegiven Admission5/14/21 to 09/19/19 syncopeHgb 6.7 given blood+ Feraheme on 09/18/19 dxsystemic iron deficiency anemia observed and Hgb increased to 7.7 followed by [facility] gastro[facility]ology    Follow up assessment after oncology treatments/progression Patient is able to verify HIPAA [facility] Portability and Accountability Act) identifiers, date of birth (DOB) and address Reviewed and addressed the purpose of the follow up call with the patient  Consent: [facility]) RN CM reviewed [facility] services with patient. Patient gave verbal consent for services.  Assessment of symptoms after treatments He reports he continues to cope well He voices his treatments end on 01/13/20  He has been using Carafate and he reports only noting any symptoms at the beginning of his day He states in the mornings after he awakes he feels like he has to cough up phlegm and some throat tenderness but this improves by mid morning  Less nausea, No emesis Has Zofran prn   Nutrition and weight He continues to take his supplements TID and voices he has gained weight since the last outreach  He is meeting with his nutritionist related to 60 lb weight loss in 6 months since January 2021 He states he weight has been from 205 -210 lbs  He reports his last weight was 208.5 lb   BMI  [facility] RN CM provide education on Body Mass Index (BMI) calculations   Dr Cliffton Asters discussed wt loss and discussed not wanting to see him below 190  for ht of 5'11" Follow up appointment with Dr Sherlynn Stalls foot scheduled for 01/21/20     His Hemoglobin was 10.5 now listed as 10.0 on 01/03/20   Hemoglobin & Hematocrit     Component Value Date/Time   HGB 10.0 (L) 01/03/2020 0813   HCT 31.0 (L) 01/03/2020 0813      FallsNo further falls   [facility] RN CM intervention [facility] RN CM discussed BMI He was encouraged to updated [facility] RN CM of need of more coupons and assistance prn  Discussed upcoming appointments and [facility] future outreach    Social:Mr GENEVIEVE VIZZINI is a 64 year old male who continues to drive and work in maintenance&security at [employer]. He lives alone and isindependent with all careHe has a brother [name] who assists at intervals. His co workers are very supportive He has Dentist in [employer] He states he can get to all of his medical appointments independently He denies needs with social determinants such as housing, finances etc  He was encouraged to please updated [facility] RN CM if there are changes in needs with social determinants  Conditions:Largefat containinghiatal herniathat was found to beEsophageal masswith invasion into cardia-approximately 6.0 x 5.4 x 7.7 cmafterbiopsies -followed by Dr Lucienne Capers he is dx with malignant neoplasm of lower third of esophagus-squamous cell carcinoma, 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, cN1, cM0), hepatic and bilateral simple renal cysts, colonic diverticulosis, dysphagia,60 lb weight loss in 6 months since January 2021, 5/14/21syncope leading to systemic iron deficiency anemia- reports dx as anemic as a child,history of alcohol abuse,(pt now states2-3 beers per week was 3-4 qd) gout  Fellwith syncope on 09/17/19 withno injury He was entering a bar  Plans [facility] RN CM will follow up with Mr Posa within the next 21-28 business days Pt encouraged to return a call to [facility] RN CM prn Routed note to MD  Goals Addressed              This Visit's Progress       Patient Stated   .  [facility]) Patient will be able to manage his health needs (including weight loss, fall prevention) at home related to treatments for esophageal cancer (pt-stated)   On track     CARE PLAN ENTRY (see longitudinal plan of care for additional care plan information)  Current Barriers:  Marland Kitchen Knowledge Deficits related to fall precautions . Knowledge Deficits related to esophageal cancer, weight loss and side effects of cancer treatment  . Cognitive Deficits  Clinical Goal(s):  Marland Kitchen Over the next 45 days, patient will not experience additional falls Last fall Sep 17 2019 01/04/20 Continues without falls . Over the next 90 days, patient will be able to maintain his weight and report any abnormal or worsening symptoms to MDs during and after cancer treatment- Still reported with some weight loss and he has been encouraged to increase the boost intake from bid to tid by Dr Mosetta Putt per pt -01/04/20 Pt continues with boost tid, doing well, maintaining wt with boost, and Nutritionist outreach  Interventions:   . Assessed for falls since last encounter. . Assessed patients knowledge of fall risk prevention secondary to previously provided education. . Follow up on his coping and home management after medical appointments and treatments . Assess & Encouraged good nutritional intake to include intake of Boost supplements . Assess for weight changes . Answered questions about cancer staging, food intake, food texture . Assessed for dental care and needs . Discussed the importance of monitoring and reporting any bleeding, sores, pain or swelling of his teeth to his pcp or oncologist [facility] RN CM discussed minimizing risks of infection  . Chemotherapy, esophageal cancer, nausea and vomiting with cancer treatment, diet after mouth or throat surgery, soft diet EMMI materials sent via e-mail . Assessed for social determinants of health needs and encouraged patient to outreach if needs arise . Sent  further EMMI materials on stages of cancer, low purine diet, home treatments for constipation when you have cancer, coughing techniques and barrett's esophagus discharge instructions via his e-mail address . Boost coupons sent via mail on 12/28/19 with collaboration of [facility] CMA, Adelina Mings . 12/28/19 Conferenced with him to thoracic surgeon office to assist with scheduling appointment to be seen after radiation completed . BMI education completed   Patient Self Care Activities:  . De-clutter walkways . Change positions slowly . Wear secure fitting shoes at all times with ambulation . Utilize home lighting for dim lit areas . Have self and pet awareness at all times . Reports any bleeding, sores, pain or swelling of his teeth to his pcp or oncologist  . Eats soft foods and take in nutritional supplements (Boost)-increase intake from bid to tid prn . Maintains good oral care  . Follow up with MD about cancer staging . Night cough, breathing treatments  . Make an appointment with pcp . Check on inhaler prescription . Review EMMI education materials   Plan: . CCM RN CM will follow up within the next 21-28 business days to see how he is doing with his oncology treatments, evaluate for care coordination and disease management needs    Please see past updates related to this goal by  clicking on the "Past Updates" button in the selected goal   Please see other previous [facility] care plan information listed in Epic under the flow sheet section            Maliya Marich L. Noelle Penner, RN, BSN, CCM [facility] Telephonic Care Management Care Coordinator Office number [phone] Mobile number [phone]  Main [facility] number [phone] Fax number [phone]

## 2020-01-05 ENCOUNTER — Telehealth: Payer: Self-pay | Admitting: Nurse Practitioner

## 2020-01-05 ENCOUNTER — Ambulatory Visit
Admission: RE | Admit: 2020-01-05 | Discharge: 2020-01-05 | Disposition: A | Discharge status: Home / Self Care | Payer: 59 | Source: Ambulatory Visit | Attending: Radiation Oncology | Admitting: Radiation Oncology

## 2020-01-05 ENCOUNTER — Other Ambulatory Visit: Payer: Self-pay

## 2020-01-05 DIAGNOSIS — Z510 Encounter for antineoplastic radiation therapy: Secondary | ICD-10-CM | POA: Insufficient documentation

## 2020-01-05 DIAGNOSIS — C155 Malignant neoplasm of lower third of esophagus: Secondary | ICD-10-CM | POA: Diagnosis present

## 2020-01-05 DIAGNOSIS — Z23 Encounter for immunization: Secondary | ICD-10-CM | POA: Diagnosis not present

## 2020-01-05 DIAGNOSIS — I2510 Atherosclerotic heart disease of native coronary artery without angina pectoris: Secondary | ICD-10-CM | POA: Insufficient documentation

## 2020-01-05 DIAGNOSIS — Z01812 Encounter for preprocedural laboratory examination: Secondary | ICD-10-CM | POA: Insufficient documentation

## 2020-01-05 DIAGNOSIS — D500 Iron deficiency anemia secondary to blood loss (chronic): Secondary | ICD-10-CM | POA: Insufficient documentation

## 2020-01-05 DIAGNOSIS — Z20822 Contact with and (suspected) exposure to covid-19: Secondary | ICD-10-CM | POA: Insufficient documentation

## 2020-01-05 DIAGNOSIS — Z79899 Other long term (current) drug therapy: Secondary | ICD-10-CM | POA: Insufficient documentation

## 2020-01-05 DIAGNOSIS — R1310 Dysphagia, unspecified: Secondary | ICD-10-CM | POA: Insufficient documentation

## 2020-01-05 DIAGNOSIS — E669 Obesity, unspecified: Secondary | ICD-10-CM | POA: Insufficient documentation

## 2020-01-05 DIAGNOSIS — K219 Gastro-esophageal reflux disease without esophagitis: Secondary | ICD-10-CM | POA: Insufficient documentation

## 2020-01-05 NOTE — Telephone Encounter (Signed)
No 8/30 los

## 2020-01-06 ENCOUNTER — Other Ambulatory Visit: Payer: Self-pay

## 2020-01-06 ENCOUNTER — Ambulatory Visit
Admission: RE | Admit: 2020-01-06 | Discharge: 2020-01-06 | Disposition: A | Discharge status: Home / Self Care | Payer: 59 | Source: Ambulatory Visit | Attending: Radiation Oncology | Admitting: Radiation Oncology

## 2020-01-06 DIAGNOSIS — Z510 Encounter for antineoplastic radiation therapy: Secondary | ICD-10-CM | POA: Diagnosis not present

## 2020-01-07 ENCOUNTER — Other Ambulatory Visit: Payer: Self-pay

## 2020-01-07 ENCOUNTER — Ambulatory Visit
Admission: RE | Admit: 2020-01-07 | Discharge: 2020-01-07 | Disposition: A | Discharge status: Home / Self Care | Payer: 59 | Source: Ambulatory Visit | Attending: Radiation Oncology | Admitting: Radiation Oncology

## 2020-01-07 DIAGNOSIS — Z510 Encounter for antineoplastic radiation therapy: Secondary | ICD-10-CM | POA: Diagnosis not present

## 2020-01-07 MED FILL — Dexamethasone Sodium Phosphate Inj 100 MG/10ML: INTRAMUSCULAR | Qty: 2 | Status: AC

## 2020-01-10 NOTE — Progress Notes (Signed)
[place]   [phone] [phone]   Clinic Follow up Note   Patient Care Team: [name], MD as PCP - General (Family Medicine) [name], MD as Consulting Physician (Oncology) Jonnie Finner, RN as Registered Nurse Otis Brace, MD as Consulting Physician (Gastroenterology) Barbaraann Faster, RN as [place] [facility] Karie Mainland, RD as Dietitian (Nutrition) Lajuana Matte, MD as Consulting Physician (Cardiothoracic Surgery) Arta Silence, MD as Consulting Physician (Gastroenterology) 01/11/2020  CHIEF COMPLAINT: F/u esophagus cancer   SUMMARY OF ONCOLOGIC HISTORY: Oncology History Overview Note  Cancer Staging Malignant neoplasm of lower third of esophagus [facility]) Staging form: Esophagus - Squamous Cell Carcinoma, AJCC 8th Edition - Clinical stage from 11/23/2019: Stage Unknown (cTX, cN1, cM0) - Signed by [name], MD on 11/26/2019     Malignant neoplasm of lower third of esophagus ([place])  09/14/2019 Imaging   CT AP W Contrast 09/14/19 IMPRESSION: 1. Large hiatal hernia. 2. Hepatic and bilateral simple renal cysts. 3. Colonic diverticulosis. 4. Small fat containing left inguinal hernia.   11/10/2019 Imaging   CT Chest 11/10/19  IMPRESSION: 1. There is a large mass of the lower third of the esophagus, with ill-defined margins, measuring approximately 6.0 x 5.4 x 7.7 cm. This appears enlarged and more appreciably masslike than appearance on prior CT dated 09/14/2019. 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 2. There are prominent gastrohepatic ligament lymph nodes adjacent to the lesser curvature, concerning for nodal metastatic disease although not ideally imaged on this examination of the chest. 3. No evidence of metastatic disease within in the chest. 4. Coronary artery disease.   11/22/2019 PET scan   PET 11/22/19 IMPRESSION: 1. Intensely hypermetabolic (max SUV 79.8) lower  thoracic esophageal 6.0 x 5.5 x 9.4 cm mass extending to the esophagogastric junction with probable involvement of the gastric cardia, compatible with primary esophageal malignancy. 2. Hypermetabolic gastrohepatic ligament nodal metastases. 3. No hypermetabolic liver or other distant metastases. 4. Chronic findings include: Aortic Atherosclerosis (ICD10-I70.0). Coronary atherosclerosis. Marked diffuse colonic diverticulosis.   11/23/2019 Procedure   EGD with Upper Endoscopy by Dr [name] 11/23/19 IMPRESSION - Partially obstructing, likely malignant esophageal tumor was found in the lower third of the esophagus. Biopsied. 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 Biopsied. - Normal duodenal bulb, first portion of the duodenum and second portion of the duodenum.   11/23/2019 Initial Biopsy   FINAL MICROSCOPIC DIAGNOSIS:   A. STOMACH, CARDIA, BIOPSY:  - Invasive well-differentiated squamous cell carcinoma.  See comment   B. ESOPHAGUS, DISTAL, BIOPSY:  - Invasive well-differentiated squamous cell carcinoma.  See comment     COMMENT:   A  B.   Dr. Tresa Moore reviewed the case and concurs with the diagnosis.  Dr. [name] was paged on 11/24/2019.    11/23/2019 Cancer Staging   Staging form: Esophagus - Squamous Cell Carcinoma, AJCC 8th Edition - Clinical stage from 11/23/2019: Stage Unknown (cTX, cN1, cM0) - Signed by [name], MD on 11/26/2019   11/24/2019 Initial Diagnosis   Malignant neoplasm of lower third of esophagus ([place])   12/06/2019 -  Chemotherapy   Concurrent chemoradiation with weekly carboplatin and Taxol starting 12/06/19   12/07/2019 -  Radiation Therapy   Concurrent chemoradiation with Dr Lisbeth Renshaw starting 12/07/19     CURRENT THERAPY: Concurrent chemoRT with taxol and carboplatin starting 12/07/19  INTERVAL HISTORY: [name] returns for f/u and treatment as scheduled. He continues ccRT with taxol and carboplatin. Carbo was held  last week for thrombocytopenia. He is doing  well. Remains able to eat and drink most things that are well chewed, no significant pain. He coughs to clear phelgm in the morning, no other cough, chest pain, dyspnea. Denies n/v/c/d. Denies neuropathy.    MEDICAL HISTORY:  Past Medical History:  Diagnosis Date  . Anemia   . Anemia   . Malignant neoplasm of lower third of esophagus ([place]) 11/24/2019    SURGICAL HISTORY: Past Surgical History:  Procedure Laterality Date  . BIOPSY  11/23/2019   Procedure: BIOPSY;  Surgeon: Arta Silence, MD;  Location: [facility];  Service: Endoscopy;;  . ESOPHAGOGASTRODUODENOSCOPY (EGD) WITH PROPOFOL N/A 11/23/2019   Procedure: ESOPHAGOGASTRODUODENOSCOPY (EGD) WITH PROPOFOL;  Surgeon: Arta Silence, MD;  Location: [facility];  Service: Endoscopy;  Laterality: N/A;  . TONSILLECTOMY      I have reviewed the social history and family history with the patient and they are unchanged from previous note.  ALLERGIES:  has No Known Allergies.  MEDICATIONS:  Current Outpatient Medications  Medication Sig Dispense Refill  . Colchicine 0.6 MG CAPS Take 1 capsule by mouth 2 (two) times daily as needed (gout).     . FEROSUL 325 (65 Fe) MG tablet Take 1 tablet (325 mg total) by mouth daily. 30 tablet 2  . ondansetron (ZOFRAN) 8 MG tablet Take 1 tablet (8 mg total) by mouth 2 (two) times daily as needed for refractory nausea / vomiting. Start on day 3 after chemo. 30 tablet 1  . pantoprazole (PROTONIX) 40 MG tablet Take 40 mg by mouth daily.    . polyethylene glycol (MIRALAX / GLYCOLAX) 17 g packet Take 17 g by mouth daily.    . prochlorperazine (COMPAZINE) 10 MG tablet TAKE 1 TABLET(10 MG) BY MOUTH EVERY 6 HOURS AS NEEDED FOR NAUSEA OR VOMITING 30 tablet 1  . sucralfate (CARAFATE) 1 g tablet Take 1 tablet (1 g total) by mouth 4 (four) times daily. Dissolve each tablet in 15 cc water before use. 120 tablet 2   No current facility-administered medications for this visit.    PHYSICAL EXAMINATION: ECOG  PERFORMANCE STATUS: 1 - Symptomatic but completely ambulatory  Vitals:   01/11/20 0922  BP: 112/65  Pulse: 65  Resp: 18  Temp: 97.8 F (36.6 C)  SpO2: 100%   Filed Weights   01/11/20 0922  Weight: 205 lb 6.4 oz (93.2 kg)    GENERAL:alert, no distress and comfortable SKIN: no rash, hyperpigmentation, or erythema  EYES: sclera clear LUNGS: clear with normal breathing effort HEART: regular rate & rhythm NEURO: alert & oriented x 3 with fluent speech  LABORATORY DATA:  I have reviewed the data as listed CBC Latest Ref Rng & Units 01/11/2020 01/03/2020 12/27/2019  WBC 4.0 - 10.5 K/uL 1.2(L) 2.5(L) 3.3(L)  Hemoglobin 13.0 - 17.0 g/dL 9.8(L) 10.0(L) 10.5(L)  Hematocrit 39 - 52 % 30.8(L) 31.0(L) 33.4(L)  Platelets 150 - 400 K/uL 118(L) 95(L) 165     CMP Latest Ref Rng & Units 01/11/2020 01/03/2020 12/27/2019  Glucose 70 - 99 mg/dL 105(H) 127(H) 117(H)  BUN 8 - 23 mg/dL 11 12 14   Creatinine 0.61 - 1.24 mg/dL 0.68 0.63 0.66  Sodium 135 - 145 mmol/L 139 140 136  Potassium 3.5 - 5.1 mmol/L 4.2 3.9 4.0  Chloride 98 - 111 mmol/L 105 106 107  CO2 22 - 32 mmol/L 29 27 23   Calcium 8.9 - 10.3 mg/dL 9.0 9.4 9.4  Total Protein 6.5 - 8.1 g/dL 6.4(L) 6.1(L) 6.5  Total Bilirubin 0.3 - 1.2 mg/dL <0.2(L) 0.2(L) 0.2(L)  Alkaline Phos 38 - 126 U/L 71 64 70  AST 15 - 41 U/L 37 29 32  ALT 0 - 44 U/L 49(H) 34 39      RADIOGRAPHIC STUDIES: I have personally reviewed the radiological images as listed and agreed with the findings in the report. No results found.   ASSESSMENT & PLAN: Brett Dawson a 63 y.o.malewith    1.Esophagealsquamous cell carcinoma, in distalesophagus,cTxN1M0 -He presented with symptomatic anemia and syncope in 09/2019 and 60 pound weight loss since 05/2019.Endoscopy on 11/09/2019 significant for a largedistalesophageal tumor with invasion into the cardia. The path noted ulcerated, highly atypical squamous cell epithelium. -11/10/19 CT noted a large mass in the lower  third of the esophagus measuring approximately 6.0 x 5.4 x 7.7 cmwithprominent gastrohepatic ligament lymph node adjacent to the lesser curvature. -6/94/[id number] hypermetabolic low thoracic esophageal masswith probable involvement inthe gastric cardia, and hypermetabolic gastrohepatic ligament node,no other node or distant metastasis. -He underwentsecondEGDand biopsyby Dr [name] on 11/23/19.Pathology showed Invasive well-differentiated squamous cell carcinomaof esophaguswhich extend tostomach cardia. -He was seen bycardiothoracicsurgeon Dr. Kipp Brood on 12/03/2019 who feels this is potentially resectable in the event he is able to maintain his weight (goal >190) during ccRT. He will require post therapy EGD with EUS to ensure no mediastinal invasion given the size of his tumor and repeat PET/CT 4 weeks after completing neoadjuvant treatment at which time he will discuss surgical options with the patient. -For his locally advanced esophageal squamous cell carcinoma, he beganneoadjuvantconcurrent chemoradiation with Taxol/carbo with curative intenton 8/2 (chemo starting 12/07/19)  2. Iron deficiency anemia secondary to blood loss -Secondary to #1 -S/pblood transfusion on 09/27/19 andIV Feraheme on5/15/21 while in the hospital for symptomatic anemia, then again on 7/16 and 11/26/2019. -Hgb improved, in 10 range on chemo, ferritin183 on 12/07/19 much improved  3. Weight loss and dysphagia -He initially presented with mild-moderate dysphagia. His mass is nonobstructive -The patient reports losing approximately 60 pounds over 6 months since January 2021 -Followed by dietitian -Weight stable on a normal diet, mild dysphagia without odynophagia  4. Alcohol use  -The patient has a prior history of alcohol abuse.  -He has cut back significantly, again encouraged to stop altogether   Disposition: Mr. Heidelberger appears stable.  He completed 5 weeks of neoadjuvant ccRT,  carboplatin was held with week 5 due to thrombocytopenia.  Platelets have improved today but he has developed moderate neutropenia with ANC 0.9. CMP is stable. He is otherwise doing well, tolerating treatment without significant toxicities.  Weight is stable.  He is maintaining normal diet overall.  I recommend to hold final chemo today due to neutropenia.  He will complete radiation on 9/9 and proceed with follow-up with cardiothoracic surgery on 9/17.  We will see him back in 2 weeks to monitor his count recovery, and order restaging scans at next visit.  He agrees to receive first covid19 vaccine this week in order to be fully vaccinated prior to surgery.   All questions were answered. The patient knows to call the clinic with any problems, questions or concerns. No barriers to learning was detected.     Alla Feeling, NP 01/11/20

## 2020-01-11 ENCOUNTER — Inpatient Hospital Stay: Payer: 59 | Attending: Nurse Practitioner | Admitting: Nurse Practitioner

## 2020-01-11 ENCOUNTER — Ambulatory Visit
Admission: RE | Admit: 2020-01-11 | Discharge: 2020-01-11 | Disposition: A | Discharge status: Home / Self Care | Payer: 59 | Source: Ambulatory Visit | Attending: Radiation Oncology | Admitting: Radiation Oncology

## 2020-01-11 ENCOUNTER — Inpatient Hospital Stay: Payer: 59

## 2020-01-11 ENCOUNTER — Encounter: Payer: Self-pay | Admitting: Nurse Practitioner

## 2020-01-11 ENCOUNTER — Inpatient Hospital Stay: Payer: 59 | Admitting: Nutrition

## 2020-01-11 ENCOUNTER — Other Ambulatory Visit: Payer: Self-pay

## 2020-01-11 ENCOUNTER — Encounter: Payer: 59 | Admitting: Nutrition

## 2020-01-11 VITALS — BP 112/65 | HR 65 | Temp 97.8°F | Resp 18 | Ht 71.0 in | Wt 205.4 lb

## 2020-01-11 DIAGNOSIS — C155 Malignant neoplasm of lower third of esophagus: Secondary | ICD-10-CM

## 2020-01-11 DIAGNOSIS — Z510 Encounter for antineoplastic radiation therapy: Secondary | ICD-10-CM | POA: Diagnosis not present

## 2020-01-11 LAB — CBC WITH DIFFERENTIAL (CANCER CENTER ONLY)
Abs Immature Granulocytes: 0.04 10*3/uL (ref 0.00–0.07)
Basophils Absolute: 0 10*3/uL (ref 0.0–0.1)
Basophils Relative: 1 %
Eosinophils Absolute: 0 10*3/uL (ref 0.0–0.5)
Eosinophils Relative: 3 %
HCT: 30.8 % — ABNORMAL LOW (ref 39.0–52.0)
Hemoglobin: 9.8 g/dL — ABNORMAL LOW (ref 13.0–17.0)
Immature Granulocytes: 3 %
Lymphocytes Relative: 5 %
Lymphs Abs: 0.1 10*3/uL — ABNORMAL LOW (ref 0.7–4.0)
MCH: 26.6 pg (ref 26.0–34.0)
MCHC: 31.8 g/dL (ref 30.0–36.0)
MCV: 83.5 fL (ref 80.0–100.0)
Monocytes Absolute: 0.2 10*3/uL (ref 0.1–1.0)
Monocytes Relative: 16 %
Neutro Abs: 0.9 10*3/uL — ABNORMAL LOW (ref 1.7–7.7)
Neutrophils Relative %: 72 %
Platelet Count: 118 10*3/uL — ABNORMAL LOW (ref 150–400)
RBC: 3.69 MIL/uL — ABNORMAL LOW (ref 4.22–5.81)
RDW: 24.7 % — ABNORMAL HIGH (ref 11.5–15.5)
WBC Count: 1.2 10*3/uL — ABNORMAL LOW (ref 4.0–10.5)
nRBC: 0 % (ref 0.0–0.2)

## 2020-01-11 LAB — CMP (CANCER CENTER ONLY)
ALT: 49 U/L — ABNORMAL HIGH (ref 0–44)
AST: 37 U/L (ref 15–41)
Albumin: 3.1 g/dL — ABNORMAL LOW (ref 3.5–5.0)
Alkaline Phosphatase: 71 U/L (ref 38–126)
Anion gap: 5 (ref 5–15)
BUN: 11 mg/dL (ref 8–23)
CO2: 29 mmol/L (ref 22–32)
Calcium: 9 mg/dL (ref 8.9–10.3)
Chloride: 105 mmol/L (ref 98–111)
Creatinine: 0.68 mg/dL (ref 0.61–1.24)
GFR, Est AFR Am: >60 mL/min (ref >60)
GFR, Estimated: >60 mL/min (ref >60)
Glucose, Bld: 105 mg/dL — ABNORMAL HIGH (ref 70–99)
Potassium: 4.2 mmol/L (ref 3.5–5.1)
Sodium: 139 mmol/L (ref 135–145)
Total Bilirubin: <0.2 mg/dL — ABNORMAL LOW (ref 0.3–1.2)
Total Protein: 6.4 g/dL — ABNORMAL LOW (ref 6.5–8.1)

## 2020-01-12 ENCOUNTER — Telehealth: Payer: Self-pay | Admitting: Nurse Practitioner

## 2020-01-12 ENCOUNTER — Other Ambulatory Visit: Payer: Self-pay

## 2020-01-12 ENCOUNTER — Ambulatory Visit
Admission: RE | Admit: 2020-01-12 | Discharge: 2020-01-12 | Disposition: A | Discharge status: Home / Self Care | Payer: 59 | Source: Ambulatory Visit | Attending: Radiation Oncology | Admitting: Radiation Oncology

## 2020-01-12 DIAGNOSIS — Z510 Encounter for antineoplastic radiation therapy: Secondary | ICD-10-CM | POA: Diagnosis not present

## 2020-01-12 NOTE — Telephone Encounter (Signed)
Scheduled per 9/7 los. Pt is aware of appt times and dates.

## 2020-01-13 ENCOUNTER — Encounter: Payer: Self-pay | Admitting: Radiation Oncology

## 2020-01-13 ENCOUNTER — Other Ambulatory Visit: Payer: Self-pay

## 2020-01-13 ENCOUNTER — Ambulatory Visit
Admission: RE | Admit: 2020-01-13 | Discharge: 2020-01-13 | Disposition: A | Discharge status: Home / Self Care | Payer: 59 | Source: Ambulatory Visit | Attending: Radiation Oncology | Admitting: Radiation Oncology

## 2020-01-13 ENCOUNTER — Inpatient Hospital Stay: Payer: 59

## 2020-01-13 DIAGNOSIS — Z510 Encounter for antineoplastic radiation therapy: Secondary | ICD-10-CM | POA: Diagnosis not present

## 2020-01-13 DIAGNOSIS — Z23 Encounter for immunization: Secondary | ICD-10-CM

## 2020-01-21 ENCOUNTER — Other Ambulatory Visit: Payer: Self-pay

## 2020-01-21 ENCOUNTER — Encounter: Payer: Self-pay | Admitting: Thoracic Surgery (Cardiothoracic Vascular Surgery)

## 2020-01-21 ENCOUNTER — Other Ambulatory Visit: Payer: Self-pay | Admitting: *Deleted

## 2020-01-21 ENCOUNTER — Ambulatory Visit: Payer: 59 | Admitting: Thoracic Surgery (Cardiothoracic Vascular Surgery)

## 2020-01-21 VITALS — BP 117/70 | HR 67 | Temp 97.6°F | Resp 20 | Ht 71.0 in | Wt 207.0 lb

## 2020-01-21 DIAGNOSIS — C155 Malignant neoplasm of lower third of esophagus: Secondary | ICD-10-CM

## 2020-01-21 NOTE — Progress Notes (Signed)
     [address]       [place] [address]             [phone]         Brett Dawson comes in for a preoperative check.  He completed his neoadjuvant chemotherapy and radiation on September 9.  He did not get his last dose of chemotherapy likely due to leukopia.  His weight has been stable throughout his neoadjuvant therapy, and he has been able to tolerate diet without much dysphagia and odynophagia.  He is scheduled to come back in 3 to 4 weeks with is post therapy PET/CT at that point we will discuss surgical planning.

## 2020-01-24 ENCOUNTER — Other Ambulatory Visit: Payer: Self-pay

## 2020-01-24 ENCOUNTER — Other Ambulatory Visit: Payer: Self-pay | Admitting: *Deleted

## 2020-01-24 NOTE — Patient Outreach (Addendum)
[facility]) Care Management  01/24/2020  Brett Dawson 1956-02-11 [id number]   [facility] follow up outreach Brett Dawson was referred to [facility] on 11/24/19 Referral Source:[employer] Insurance Referral Reason:esophageal tumor with invasion into cardia  Insurance:[employer] Admissions x1in last 6 months  11/23/19 EGD with upper endoscopy Dr Dulce Sellar 7/16/21AnotherFeraheme dosegiven Admission5/14/21 to 09/19/19 syncopeHgb6.7 given blood+Ferahemeon 09/18/19 dxsystemic iron deficiency anemia observed and Hgb increased to 7.7 followed by [facility] gastroenterology    Follow up assessment  Patient is able to verify HIPAA [facility] Portability and Accountability Act) identifiers, date of birth (DOB) and address Reviewed and addressed the purpose of the follow up call with the patient  Consent: [facility]) RN CM reviewed [facility] services with patient. Patient gave verbal consent for services.  Brett Dawson has completed his neoadjuvant chemotherapy and radiation on September 9 1st Covid vaccine on 01/13/20 He did not get his last dose of chemotherapy likely due to leukopenia.  His weight has been stable 01/21/20 Pre op check  PET scan 02/17/20 Dr Cliffton Asters follow up on 02/18/20   Discussed leukopenia Discussed appointments He reports he can still feel something dangling in the back of his throat with swallowing Commended him for hard work He reports maintaining his weight & his lowest weight was 205 lb  [facility] RN CM discussed him following up with his Nutritionist/Dietitian about a weight maintenance plan  He reports he is presently working with social security related to the disability process related to the time he will need to recuperate. He states he has been informed he may have to be in the hospital for week plus the healing time at home. His employer does not have a human resources staff person nor vacation time. His employer  encouraged him to speak with supplemental security income (SSI) staff He did and forms will be sent to him in the mail and he has an appointment to met with SSI staff around the 02/07/20. [facility] RN CM discussed and offered [facility] SW services He reports [facility] SW are not needed at this time but if needed he will notify [facility] RN CM   Coping Brett Dawson reports he is doing well He reports he goes out to eat and continues to work. He denies a loss of interest in things and symptoms of depression He was encouraged to continue to find opportunities of self care.  Plans [facility] RN CM will follow up with Brett Dawson within the next 30 business days Pt encouraged to return a call to [facility] RN CM prn  Goals Addressed              This Visit's Progress     Patient Stated   .  [facility]) Patient will be able to manage his health needs (including weight loss, fall prevention) at home related to treatments for esophageal cancer (pt-stated)   On track     CARE PLAN ENTRY (see longitudinal plan of care for additional care plan information)  Current Barriers:  Marland Kitchen Knowledge Deficits related to fall precautions . Knowledge Deficits related to esophageal cancer, weight loss and side effects of cancer treatment  . Cognitive Deficits  Clinical Goal(s):  Marland Kitchen Over the next 45 days, patient will not experience additional falls Last fall Sep 17 2019 01/24/20 Goal met  . Over the next 90 days, patient will be able to maintain his weight and report any abnormal or worsening symptoms to MDs during and after cancer treatment- Still reported with some weight  loss and he has been encouraged to increase the boost intake from bid to tid by Dr Mosetta Putt per pt -01/24/20 Pt continues with boost tid, doing well, maintaining wt with boost, and Nutritionist outreach  Interventions:   . Assessed for falls since last encounter. . Assessed patients knowledge of fall risk prevention secondary to previously provided education. . Follow up on his coping and home  management after medical appointments and treatments . Assess & Encouraged good nutritional intake to include intake of Boost supplements . Assess for weight changes . Answered questions about cancer staging, food intake, food texture . Assessed for dental care and needs . Discussed the importance of monitoring and reporting any bleeding, sores, pain or swelling of his teeth to his pcp or oncologist [facility] RN CM discussed minimizing risks of infection  . Chemotherapy, esophageal cancer, nausea and vomiting with cancer treatment, diet after mouth or throat surgery, soft diet EMMI materials sent via e-mail . Assessed for social determinants of health needs and encouraged patient to outreach if needs arise . Sent further EMMI materials on stages of cancer, low purine diet, home treatments for constipation when you have cancer, coughing techniques and barrett's esophagus discharge instructions via his e-mail address . Boost coupons sent via mail on 12/28/19 with collaboration of [facility] CMA, Adelina Mings . 12/28/19 Conferenced with him to thoracic surgeon office to assist with scheduling appointment to be seen after radiation completed . BMI education completed . Leukopenia education discussed   Patient Self Care Activities:  . De-clutter walkways . Change positions slowly . Wear secure fitting shoes at all times with ambulation . Utilize home lighting for dim lit areas . Have self and pet awareness at all times . Reports any bleeding, sores, pain or swelling of his teeth to his pcp or oncologist  . Eats soft foods and take in nutritional supplements (Boost)-increase intake from bid to tid prn . Maintains good oral care  . Follow up with MD about cancer staging . Night cough, breathing treatments  . Make an appointment with pcp . Check on inhaler prescription . Review EMMI education materials   Plan: . CCM RN CM will follow up within the next 30 business days to evaluate for care coordination and  disease management needs    Please see past updates related to this goal by clicking on the "Past Updates" button in the selected goal   Please see other previous [facility] care plan information listed in Epic under the flow sheet section           Dewight Catino L. Noelle Penner, RN, BSN, CCM [facility] Telephonic Care Management Care Coordinator Office number [phone] [facility] number [phone] Fax number [phone]

## 2020-01-26 ENCOUNTER — Inpatient Hospital Stay (HOSPITAL_BASED_OUTPATIENT_CLINIC_OR_DEPARTMENT_OTHER): Payer: 59 | Admitting: Nurse Practitioner

## 2020-01-26 ENCOUNTER — Other Ambulatory Visit: Payer: Self-pay

## 2020-01-26 ENCOUNTER — Inpatient Hospital Stay: Payer: 59

## 2020-01-26 ENCOUNTER — Encounter: Payer: Self-pay | Admitting: Nurse Practitioner

## 2020-01-26 VITALS — BP 119/75 | HR 62 | Temp 97.6°F | Resp 18 | Ht 71.0 in | Wt 210.8 lb

## 2020-01-26 DIAGNOSIS — C155 Malignant neoplasm of lower third of esophagus: Secondary | ICD-10-CM | POA: Diagnosis not present

## 2020-01-26 DIAGNOSIS — Z510 Encounter for antineoplastic radiation therapy: Secondary | ICD-10-CM | POA: Diagnosis not present

## 2020-01-26 LAB — CMP (CANCER CENTER ONLY)
ALT: 21 U/L (ref 0–44)
AST: 29 U/L (ref 15–41)
Albumin: 3.1 g/dL — ABNORMAL LOW (ref 3.5–5.0)
Alkaline Phosphatase: 96 U/L (ref 38–126)
Anion gap: 5 (ref 5–15)
BUN: 10 mg/dL (ref 8–23)
CO2: 29 mmol/L (ref 22–32)
Calcium: 8.8 mg/dL — ABNORMAL LOW (ref 8.9–10.3)
Chloride: 106 mmol/L (ref 98–111)
Creatinine: 0.71 mg/dL (ref 0.61–1.24)
GFR, Est AFR Am: >60 mL/min (ref >60)
GFR, Estimated: >60 mL/min (ref >60)
Glucose, Bld: 99 mg/dL (ref 70–99)
Potassium: 4.2 mmol/L (ref 3.5–5.1)
Sodium: 140 mmol/L (ref 135–145)
Total Bilirubin: 0.3 mg/dL (ref 0.3–1.2)
Total Protein: 6.3 g/dL — ABNORMAL LOW (ref 6.5–8.1)

## 2020-01-26 LAB — CBC WITH DIFFERENTIAL (CANCER CENTER ONLY)
Abs Immature Granulocytes: 0.01 10*3/uL (ref 0.00–0.07)
Basophils Absolute: 0 10*3/uL (ref 0.0–0.1)
Basophils Relative: 1 %
Eosinophils Absolute: 0.1 10*3/uL (ref 0.0–0.5)
Eosinophils Relative: 2 %
HCT: 31.7 % — ABNORMAL LOW (ref 39.0–52.0)
Hemoglobin: 10.2 g/dL — ABNORMAL LOW (ref 13.0–17.0)
Immature Granulocytes: 0 %
Lymphocytes Relative: 19 %
Lymphs Abs: 0.6 10*3/uL — ABNORMAL LOW (ref 0.7–4.0)
MCH: 28.1 pg (ref 26.0–34.0)
MCHC: 32.2 g/dL (ref 30.0–36.0)
MCV: 87.3 fL (ref 80.0–100.0)
Monocytes Absolute: 0.6 10*3/uL (ref 0.1–1.0)
Monocytes Relative: 21 %
Neutro Abs: 1.7 10*3/uL (ref 1.7–7.7)
Neutrophils Relative %: 57 %
Platelet Count: 152 10*3/uL (ref 150–400)
RBC: 3.63 MIL/uL — ABNORMAL LOW (ref 4.22–5.81)
RDW: 26.6 % — ABNORMAL HIGH (ref 11.5–15.5)
WBC Count: 3 10*3/uL — ABNORMAL LOW (ref 4.0–10.5)
nRBC: 0 % (ref 0.0–0.2)

## 2020-01-26 NOTE — Progress Notes (Signed)
[place]   Telephone:(336) [phone] Fax:(336) [phone]   Clinic Follow up Note   Patient Care Team: [name], MD as PCP - General (Family Medicine) [name], MD as Consulting Physician (Oncology) Jonnie Finner, RN as Registered Nurse Otis Brace, MD as Consulting Physician (Gastroenterology) Barbaraann Faster, RN as [place] Management Karie Mainland, RD as Dietitian (Nutrition) Lajuana Matte, MD as Consulting Physician (Cardiothoracic Surgery) Arta Silence, MD as Consulting Physician (Gastroenterology) 01/26/2020  CHIEF COMPLAINT: Follow-up esophagus cancer  SUMMARY OF ONCOLOGIC HISTORY: Oncology History Overview Note  Cancer Staging Malignant neoplasm of lower third of esophagus [facility]) Staging form: Esophagus - Squamous Cell Carcinoma, AJCC 8th Edition - Clinical stage from 11/23/2019: Stage Unknown (cTX, cN1, cM0) - Signed by [name], MD on 11/26/2019     Malignant neoplasm of lower third of esophagus ([place])  09/14/2019 Imaging   CT AP W Contrast 09/14/19 IMPRESSION: 1. Large hiatal hernia. 2. Hepatic and bilateral simple renal cysts. 3. Colonic diverticulosis. 4. Small fat containing left inguinal hernia.   11/10/2019 Imaging   CT Chest 11/10/19  IMPRESSION: 1. There is a large mass of the lower third of the esophagus, with ill-defined margins, measuring approximately 6.0 x 5.4 x 7.7 cm. This appears enlarged and more appreciably masslike than appearance on prior CT dated 09/14/2019. 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 2. There are prominent gastrohepatic ligament lymph nodes adjacent to the lesser curvature, concerning for nodal metastatic disease although not ideally imaged on this examination of the chest. 3. No evidence of metastatic disease within in the chest. 4. Coronary artery disease.   11/22/2019 PET scan   PET 11/22/19 IMPRESSION: 1. Intensely hypermetabolic (max SUV 65.0)  lower thoracic esophageal 6.0 x 5.5 x 9.4 cm mass extending to the esophagogastric junction with probable involvement of the gastric cardia, compatible with primary esophageal malignancy. 2. Hypermetabolic gastrohepatic ligament nodal metastases. 3. No hypermetabolic liver or other distant metastases. 4. Chronic findings include: Aortic Atherosclerosis (ICD10-I70.0). Coronary atherosclerosis. Marked diffuse colonic diverticulosis.   11/23/2019 Procedure   EGD with Upper Endoscopy by Dr [name] 11/23/19 IMPRESSION - Partially obstructing, likely malignant esophageal tumor was found in the lower third of the esophagus. Biopsied. 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 Biopsied. - Normal duodenal bulb, first portion of the duodenum and second portion of the duodenum.   11/23/2019 Initial Biopsy   FINAL MICROSCOPIC DIAGNOSIS:   A. STOMACH, CARDIA, BIOPSY:  - Invasive well-differentiated squamous cell carcinoma.  See comment   B. ESOPHAGUS, DISTAL, BIOPSY:  - Invasive well-differentiated squamous cell carcinoma.  See comment     COMMENT:   A  B.   Dr. Tresa Moore reviewed the case and concurs with the diagnosis.  Dr. [name] was paged on 11/24/2019.    11/23/2019 Cancer Staging   Staging form: Esophagus - Squamous Cell Carcinoma, AJCC 8th Edition - Clinical stage from 11/23/2019: Stage Unknown (cTX, cN1, cM0) - Signed by [name], MD on 11/26/2019   11/24/2019 Initial Diagnosis   Malignant neoplasm of lower third of esophagus ([place])   12/06/2019 - 01/03/2020 Chemotherapy   Concurrent chemoradiation with weekly carboplatin and Taxol starting 12/06/19 - 01/13/20, last chemo 01/03/2020, cycle 6 held due to neutropenia   12/07/2019 - 01/13/2020 Radiation Therapy   Concurrent chemoradiation with Dr Lisbeth Renshaw starting 12/07/19     CURRENT THERAPY: S/p neoadjuvant CCR T completed 12/06/19-01/13/2020, carboplatin held with cycle 5 due to thrombocytopenia, cycle 6 carbo/Taxol held due to  neutropenia  INTERVAL  HISTORY: Brett Dawson returns for follow-up as scheduled.  He was seen by Dr. Kipp Brood on 01/21/2020.  A PET scan has been scheduled on 02/17/2020 after which a  surgical decision will be made.  He feels well, tolerating a normal regular diet without dysphagia or odynophagia.  He is drinking well.  Energy is adequate.  He has a loose tooth that needs to be pulled.  No nausea, vomiting, constipation, diarrhea, neuropathy, fever, chills, cough, chest pain, dyspnea, or other concerns.   MEDICAL HISTORY:  Past Medical History:  Diagnosis Date  . Anemia   . Anemia   . Malignant neoplasm of lower third of esophagus ([place]) 11/24/2019    SURGICAL HISTORY: Past Surgical History:  Procedure Laterality Date  . BIOPSY  11/23/2019   Procedure: BIOPSY;  Surgeon: Arta Silence, MD;  Location: [facility];  Service: Endoscopy;;  . ESOPHAGOGASTRODUODENOSCOPY (EGD) WITH PROPOFOL N/A 11/23/2019   Procedure: ESOPHAGOGASTRODUODENOSCOPY (EGD) WITH PROPOFOL;  Surgeon: Arta Silence, MD;  Location: [facility] ENDOSCOPY;  Service: Endoscopy;  Laterality: N/A;  . TONSILLECTOMY      I have reviewed the social history and family history with the patient and they are unchanged from previous note.  ALLERGIES:  has No Known Allergies.  MEDICATIONS:  Current Outpatient Medications  Medication Sig Dispense Refill  . Colchicine 0.6 MG CAPS Take 1 capsule by mouth 2 (two) times daily as needed (gout).     . FEROSUL 325 (65 Fe) MG tablet Take 1 tablet (325 mg total) by mouth daily. 30 tablet 2  . ondansetron (ZOFRAN) 8 MG tablet Take 1 tablet (8 mg total) by mouth 2 (two) times daily as needed for refractory nausea / vomiting. Start on day 3 after chemo. 30 tablet 1  . pantoprazole (PROTONIX) 40 MG tablet Take 40 mg by mouth daily.    . polyethylene glycol (MIRALAX / GLYCOLAX) 17 g packet Take 17 g by mouth daily.    . prochlorperazine (COMPAZINE) 10 MG tablet TAKE 1 TABLET(10 MG) BY MOUTH EVERY 6 HOURS AS NEEDED FOR NAUSEA OR  VOMITING 30 tablet 1  . sucralfate (CARAFATE) 1 g tablet Take 1 tablet (1 g total) by mouth 4 (four) times daily. Dissolve each tablet in 15 cc water before use. 120 tablet 2   No current facility-administered medications for this visit.    PHYSICAL EXAMINATION: ECOG PERFORMANCE STATUS: 0 - Asymptomatic  Vitals:   01/26/20 1446  BP: 119/75  Pulse: 62  Resp: 18  Temp: 97.6 F (36.4 C)  SpO2: 100%   Filed Weights   01/26/20 1446  Weight: 210 lb 12.8 oz (95.6 kg)    GENERAL:alert, no distress and comfortable SKIN: No rash to exposed skin EYES: sclera clear LUNGS: normal breathing effort HEART:  no lower extremity edema NEURO: alert & oriented x 3 with fluent speech  LABORATORY DATA:  I have reviewed the data as listed CBC Latest Ref Rng & Units 01/26/2020 01/11/2020 01/03/2020  WBC 4.0 - 10.5 K/uL 3.0(L) 1.2(L) 2.5(L)  Hemoglobin 13.0 - 17.0 g/dL 10.2(L) 9.8(L) 10.0(L)  Hematocrit 39 - 52 % 31.7(L) 30.8(L) 31.0(L)  Platelets 150 - 400 K/uL 152 118(L) 95(L)     CMP Latest Ref Rng & Units 01/26/2020 01/11/2020 01/03/2020  Glucose 70 - 99 mg/dL 99 105(H) 127(H)  BUN 8 - 23 mg/dL 10 11 12   Creatinine 0.61 - 1.24 mg/dL 0.71 0.68 0.63  Sodium 135 - 145 mmol/L 140 139 140  Potassium 3.5 - 5.1 mmol/L 4.2  4.2 3.9  Chloride 98 - 111 mmol/L 106 105 106  CO2 22 - 32 mmol/L 29 29 27   Calcium 8.9 - 10.3 mg/dL 8.8(L) 9.0 9.4  Total Protein 6.5 - 8.1 g/dL 6.3(L) 6.4(L) 6.1(L)  Total Bilirubin 0.3 - 1.2 mg/dL 0.3 <0.2(L) 0.2(L)  Alkaline Phos 38 - 126 U/L 96 71 64  AST 15 - 41 U/L 29 37 29  ALT 0 - 44 U/L 21 49(H) 34      RADIOGRAPHIC STUDIES: I have personally reviewed the radiological images as listed and agreed with the findings in the report. No results found.   ASSESSMENT & PLAN: Brett Dawson a 64 y.o.malewith    1.Esophagealsquamous cell carcinoma, in distalesophagus,cTxN1M0 -He presented with symptomatic anemia and syncope in 09/2019 and 60 pound weight loss  since 05/2019.Endoscopy on 11/09/2019 significant for a largedistalesophageal tumor with invasion into the cardia. The path noted ulcerated, highly atypical squamous cell epithelium. -11/10/19 CT noted a large mass in the lower third of the esophagus measuring approximately 6.0 x 5.4 x 7.7 cmwithprominent gastrohepatic ligament lymph node adjacent to the lesser curvature. -5/17/[id number] hypermetabolic low thoracic esophageal masswith probable involvement inthe gastric cardia, and hypermetabolic gastrohepatic ligament node,no other node or distant metastasis. -He underwentsecondEGDand biopsyby Dr [name] on 11/23/19.Pathology showed Invasive well-differentiated squamous cell carcinomaof esophaguswhich extend tostomach cardia. -He was seen bycardiothoracicsurgeon Dr. Kipp Brood on 12/03/2019 who feels this is potentially resectable in the event he is able to maintain his weight (goal >190) during ccRT. He will require post therapy EGD with EUS to ensure no mediastinal invasion given the size of his tumor and repeat PET/CT 4 weeks after completing neoadjuvant treatment at which time he will discuss surgical options with the patient. -For his locally advanced esophageal squamous cell carcinoma, he completed ccRT with taxol and carboplatin with curative intent 12/06/19 - 01/13/19, carboplatin was held with cycle 5 due to thrombocytopenia, taxol/carbo held with cycle 6 due to neutropenia   2. Iron deficiency anemia secondary to blood loss -Secondary to #1 -S/pblood transfusion on 09/27/19 andIV Feraheme on5/15/21 while in the hospital for symptomatic anemia, then again on 7/16 and 11/26/2019. -Hgb improved, in 10 range on chemo, ferritin183 on 12/07/19 much improved  3. Weight loss and dysphagia -He initially presented with mild-moderate dysphagia. His mass is nonobstructive -The patient reports losing approximately 60 pounds over 6 months since January 2021 -Followed by  dietitian -Weight stable on chemoRT, able to tolerate normal diet with mild dysphagia without odynophagia -dysphagia resolved after chemoRT  4. Alcohol use  -The patient has a prior history of alcohol abuse.  -He has cut back significantly, again encouraged to stop altogether  Disposition: Mr. Blaney appears stable.  He completed ccRT on 01/13/2020, last chemo on 01/03/2020. He has recovered well from treatment.  He is gaining weight.  Tolerating a normal diet; dysphagia resolved after chemoRT.  He was able to recover and function well.  He was seen by Dr. Kipp Brood for surgical consideration.  The decision is pending restaging studies. PET scheduled on 02/17/2020.  I will discuss with Dr. Kipp Brood to see if he also requires repeat EGD with EUS to make the final decision.  If he is offered surgery we will follow-up with him after surgical resection.  If he is deemed not a surgical candidate we will offer him consolidation chemotherapy. Tentative plan to follow-up in 4 weeks after additional work-up.    All questions were answered. The patient knows to call the clinic with any problems, questions  or concerns. No barriers to learning were detected.     Alla Feeling, NP 01/26/20

## 2020-01-27 ENCOUNTER — Telehealth: Payer: Self-pay | Admitting: Nurse Practitioner

## 2020-01-27 NOTE — Telephone Encounter (Signed)
Scheduled per 9/22 los. Unable to reach pt. No voicemail set up. Mailing pt appt calendar.

## 2020-02-01 NOTE — Progress Notes (Signed)
  Radiation Oncology         [phone] ________________________________  Name: Brett Dawson MRN: [id number]  Date: 01/13/2020  DOB: 1955/05/31  End of Treatment Note  Diagnosis:   esophageal cancer     Indication for treatment::  curative       Radiation treatment dates:   12/06/19 - 01/13/20  Site/dose:   The patient was treated to the esophagus to a dose of 45 Gy in 25 fractions using an IMRT technique. The patient then received a boost for an additional 5.4 Gy to yield a total dose of 50.4 Gy. An SIB technique was also used to treat the tumor to a dose of 50 Gy/6Gy for a total of 56 Gy total.  Narrative: The patient tolerated radiation treatment and completed the prescribed course.     Plan: The patient has completed radiation treatment. The patient will return to radiation oncology clinic for routine followup in one month. I advised the patient to call or return sooner if they have any questions or concerns related to their recovery or treatment. ________________________________  Jodelle Gross, M.D., Ph.D.

## 2020-02-03 ENCOUNTER — Other Ambulatory Visit: Payer: Self-pay

## 2020-02-03 ENCOUNTER — Inpatient Hospital Stay: Payer: 59

## 2020-02-03 DIAGNOSIS — Z23 Encounter for immunization: Secondary | ICD-10-CM

## 2020-02-07 ENCOUNTER — Telehealth: Payer: Self-pay | Admitting: Radiation Oncology

## 2020-02-10 NOTE — Telephone Encounter (Signed)
  Radiation Oncology         [phone] ________________________________  Name: Brett Dawson MRN: [id number]  Date of Service: 02/07/2020  DOB: 1955/10/09  Post Treatment Telephone Note  Diagnosis:   cTxN1M0 squamous cell carcinoma of the distal esophagus.  Interval Since Last Radiation:  4 weeks   12/06/19 - 01/13/20: The patient was treated to the esophagus to a dose of 45 Gy in 25 fractions using an IMRT technique. The patient then received a boost for an additional 5.4 Gy to yield a total dose of 50.4 Gy. An SIB technique was also used to treat the tumor to a dose of 50 Gy/6Gy for a total of 56 Gy total.  Narrative:  The patient was contacted today for routine follow-up. During treatment he did very well with radiotherapy and did not have significant desquamation, and at the end of treatmetn was doing well with oral intake. He did have some sensations of something getting stuck when eating that would resolve by clearing his throat. He's planning on repeat PET scan to determine next steps for surgical intervention.  Impression/Plan: 1. cTxN1M0 squamous cell carcinoma of the distal esophagus. The patient has been doing well since completion of radiotherapy. I was unable to reach the patient and he does not have voicemail set up. We would be happy to continue to follow him as needed, but he will also continue to follow up with Dr. Burr Medico in medical oncology and with Dr. Kipp Brood in [place] surgery as he considers esophagectomy.    Carola Rhine, PAC

## 2020-02-17 ENCOUNTER — Other Ambulatory Visit: Payer: Self-pay

## 2020-02-17 ENCOUNTER — Encounter (HOSPITAL_COMMUNITY)
Admission: RE | Admit: 2020-02-17 | Discharge: 2020-02-17 | Disposition: A | Discharge status: Home / Self Care | Payer: 59 | Source: Ambulatory Visit | Attending: Thoracic Surgery (Cardiothoracic Vascular Surgery) | Admitting: Thoracic Surgery (Cardiothoracic Vascular Surgery)

## 2020-02-17 DIAGNOSIS — C155 Malignant neoplasm of lower third of esophagus: Secondary | ICD-10-CM | POA: Diagnosis not present

## 2020-02-17 LAB — GLUCOSE, CAPILLARY: Glucose-Capillary: 108 mg/dL — ABNORMAL HIGH (ref 70–99)

## 2020-02-17 MED ORDER — FLUDEOXYGLUCOSE F - 18 (FDG) INJECTION
10.5200 | Freq: Once | INTRAVENOUS | Status: AC | PRN
  Administered 2020-02-17: 10.52 via INTRAVENOUS

## 2020-02-18 ENCOUNTER — Other Ambulatory Visit: Payer: Self-pay | Admitting: Thoracic Surgery (Cardiothoracic Vascular Surgery)

## 2020-02-18 ENCOUNTER — Encounter: Payer: Self-pay | Admitting: Thoracic Surgery (Cardiothoracic Vascular Surgery)

## 2020-02-18 ENCOUNTER — Ambulatory Visit (INDEPENDENT_AMBULATORY_CARE_PROVIDER_SITE_OTHER): Payer: 59 | Admitting: Thoracic Surgery (Cardiothoracic Vascular Surgery)

## 2020-02-18 ENCOUNTER — Telehealth (HOSPITAL_COMMUNITY): Payer: Self-pay | Admitting: Licensed Clinical Social Worker

## 2020-02-18 ENCOUNTER — Other Ambulatory Visit: Payer: Self-pay | Admitting: *Deleted

## 2020-02-18 VITALS — BP 129/75 | HR 70 | Resp 18 | Ht 71.0 in | Wt 218.0 lb

## 2020-02-18 DIAGNOSIS — C159 Malignant neoplasm of esophagus, unspecified: Secondary | ICD-10-CM

## 2020-02-18 DIAGNOSIS — C155 Malignant neoplasm of lower third of esophagus: Secondary | ICD-10-CM

## 2020-02-18 NOTE — Telephone Encounter (Signed)
CSW was contacted by [facility] Spiller. Pt with upcoming procedure and may benefit from post acute care, possibly SNF. CSW called and spoke with benefits center at pt [place] to better understand coverage options to provide guidance. CSW then attempted to reach pt at preferred number [phone]). No answer and unable to leave voicemail as no answering machine set up at this time.   Horace Porteous, RN, and Dr. Kipp Brood. CSW will re-attempt to reach out to pt as able before procedure.   Westley Hummer, MSW, [place] Work

## 2020-02-18 NOTE — Progress Notes (Signed)
[name]       [name] [address]             [phone]                    Brett Dawson Power Medical Record #[id number] Date of Birth: January 03, 1956  Referring: [name], MD Primary Care: [name], MD Primary Cardiologist: No primary care provider on file.  Chief Complaint:    Chief Complaint  Patient presents with  . Esophageal Cancer    post treatment PET 02/17/20    History of Present Illness:    Brett Dawson 64 y.o. male presents for his post therapy PET scan review. He completed his neoadjuvant chemotherapy and radiation on September 9.  His weight has remained stable throughout the course of his therapy.  When he was originally diagnosed he had lost approximately 60 pounds, but did not have much dysphagia or diet aphasia.  His original CT scan did show a very large distal esophageal lesion and biopsies were consistent with squamous cell cancer.  Smoking Hx: Non-smoker   Zubrod Score: At the time of surgery this patient's most appropriate activity status/level should be described as: [x]     0    Normal activity, no symptoms []     1    Restricted in physical strenuous activity but ambulatory, able to do out light work []     2    Ambulatory and capable of self care, unable to do work activities, up and about               >50 % of waking hours                              []     3    Only limited self care, in bed greater than 50% of waking hours []     4    Completely disabled, no self care, confined to bed or chair []     5    Moribund   Past Medical History:  Diagnosis Date  . Anemia   . Anemia   . Malignant neoplasm of lower third of esophagus ([place]) 11/24/2019    Past Surgical History:  Procedure Laterality Date  . BIOPSY  11/23/2019   Procedure: BIOPSY;  Surgeon: Arta Silence, MD;  Location: [facility];  Service: Endoscopy;;  . ESOPHAGOGASTRODUODENOSCOPY (EGD) WITH PROPOFOL N/A 11/23/2019   Procedure:  ESOPHAGOGASTRODUODENOSCOPY (EGD) WITH PROPOFOL;  Surgeon: Arta Silence, MD;  Location: [facility];  Service: Endoscopy;  Laterality: N/A;  . TONSILLECTOMY      Family History  Problem Relation Age of Onset  . Hyperlipidemia Mother      Social History   Tobacco Use  Smoking Status Never Smoker  Smokeless Tobacco Never Used    Social History   Substance and Sexual Activity  Alcohol Use Yes  . Alcohol/week: 40.0 standard drinks  . Types: 40 Standard drinks or equivalent per week     No Known Allergies  Current Outpatient Medications  Medication Sig Dispense Refill  . Colchicine 0.6 MG CAPS Take 1 capsule by mouth 2 (two) times daily as needed (gout).     . FEROSUL 325 (65 Fe) MG tablet Take 1 tablet (325 mg total) by mouth daily. 30 tablet 2  . ondansetron (ZOFRAN) 8 MG tablet Take 1 tablet (8 mg total) by mouth 2 (two) times daily  as needed for refractory nausea / vomiting. Start on day 3 after chemo. 30 tablet 1  . pantoprazole (PROTONIX) 40 MG tablet Take 40 mg by mouth daily.    . polyethylene glycol (MIRALAX / GLYCOLAX) 17 g packet Take 17 g by mouth daily.    . prochlorperazine (COMPAZINE) 10 MG tablet TAKE 1 TABLET(10 MG) BY MOUTH EVERY 6 HOURS AS NEEDED FOR NAUSEA OR VOMITING 30 tablet 1  . sucralfate (CARAFATE) 1 g tablet Take 1 tablet (1 g total) by mouth 4 (four) times daily. Dissolve each tablet in 15 cc water before use. 120 tablet 2   No current facility-administered medications for this visit.    Review of Systems  Constitutional: Negative.   Respiratory: Negative.   Cardiovascular: Negative.   Gastrointestinal: Positive for heartburn and nausea.     PHYSICAL EXAMINATION: BP 129/75 (BP Location: Right Arm, Patient Position: Sitting)   Pulse 70   Resp 18   Ht 5\' 11"  (1.803 m)   Wt 218 lb (98.9 kg)   SpO2 98% Comment: RA with mask on  BMI 30.40 kg/m  Physical Exam Constitutional:      General: He is not in acute distress.    Appearance:  Normal appearance. He is normal weight. He is not ill-appearing.  Eyes:     Extraocular Movements: Extraocular movements intact.     Conjunctiva/sclera: Conjunctivae normal.  Pulmonary:     Effort: Pulmonary effort is normal. No respiratory distress.  Abdominal:     General: Abdomen is flat. There is no distension.     Palpations: Abdomen is soft.  Musculoskeletal:        General: Normal range of motion.     Cervical back: Normal range of motion.  Skin:    General: Skin is warm and dry.  Neurological:     Mental Status: He is alert.     Diagnostic Studies & Laboratory data:    CT Scan: CT from May 2021 showed what was thought to originally be large hiatal hernia.  But on repeat imaging in July, this was noted to be more consistent with an esophageal mass.  It does extend up behind the heart in the lower esophagus.  Gastrohepatic ligament lymph nodes are also enlarged.  PET/CT: PET/CT from 11/22/2019 shows a 6 cm x 9 cm esophageal mass with an SUV uptake of 20.2.  The gastric cardia is also hypermetabolic.  Gastrohepatic ligament nodes are enlarged 1.1 cm in the SUV of 4.8.  PET/CT from 02/17/2020 shows good response and decrease SUV in the esophageal mass.  There are also some enlarged lymph nodes in the gastrohepatic ligament.  PET/CT fromEGD/EUS: Endoscopy was performed by Dr. [name] on 11/23/2019.  An EUS was not performed  A large, fungating and ulcerating mass was found in the lower third of the esophagus, beginning 34 cmfrom the incisors and extending circumferentially and confluently to the GE junction (41 cm) and further into the cardia. The mass was partially obstructing and circumferential; diagnostic endoscope could pass through tumor with mild resistance.     Path:  A. STOMACH, CARDIA, BIOPSY:  - Invasive well-differentiated squamous cell carcinoma. See comment   B. ESOPHAGUS, DISTAL, BIOPSY:  - Invasive well-differentiated squamous cell carcinoma. See  comme     I have independently reviewed the above radiology studies  and reviewed the findings with the patient.   Recent Lab Findings: Lab Results  Component Value Date   WBC 3.0 (L) 01/26/2020   HGB 10.2 (L) 01/26/2020  HCT 31.7 (L) 01/26/2020   PLT 152 01/26/2020   GLUCOSE 99 01/26/2020   ALT 21 01/26/2020   AST 29 01/26/2020   NA 140 01/26/2020   K 4.2 01/26/2020   CL 106 01/26/2020   CREATININE 0.71 01/26/2020   BUN 10 01/26/2020   CO2 29 01/26/2020   INR 1.2 09/18/2019   HGBA1C 5.6 05/21/2017     PFTs: Pending   Assessment / Plan:   64 year old male with 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  He tolerated his neoadjuvant therapy and was able to maintain a weight of over 210 pounds.  At this visit he weighs 218 pounds so he has gained some weight since completion of his therapy.  I think that he would be a good surgical candidate for robotic assisted Ivor Lewis esophagectomy.  The tumor does appear smaller and I can see a definitive line between it and his posterior mediastinum.  He never had a formal EUS for pretherapy staging, but given the timing I think that it is safe to proceed with surgery.  He will need cardiac stress test and pulmonary function testing prior to surgery.  He is tentatively scheduled for October 22.  Risks and benefits have been discussed and is agreeable to proceed.  Of note he currently lives by himself thus I recommended that strongly consider post hospitalization inpatient rehab.  We will contact the social worker to give him a list of facilities.     I  spent 20 minutes with  the patient face to face and greater then 50% of the time was spent in counseling and coordination of care.    Lajuana Matte 02/18/2020 11:59 AM

## 2020-02-21 ENCOUNTER — Other Ambulatory Visit: Payer: Self-pay | Admitting: *Deleted

## 2020-02-21 ENCOUNTER — Encounter: Payer: Self-pay | Admitting: *Deleted

## 2020-02-21 NOTE — Progress Notes (Signed)
[facility] [place], [address] [place] [address] Phone: [phone] Fax: [phone]      Your procedure is scheduled on October 21  Report to [place] Entrance "A" at 0530 A.M., and check in at the Admitting office.  Call this number if you have problems the morning of surgery:  [phone]  Call [phone] if you have any questions prior to your surgery date Monday-Friday 8am-4pm    Remember:  Do not eat or drink after midnight the night before your surgery   Take these medicines the morning of surgery with A SIP OF WATER  Colchicine if needed pantoprazole (PROTONIX)  As of today, STOP taking any Aspirin (unless otherwise instructed by your surgeon) Aleve, Naproxen, Ibuprofen, Motrin, Advil, Goody's, BC's, all herbal medications, fish oil, and all vitamins.                      Do not wear jewelry            Do not wear lotions, powders, colognes, or deodorant.            Men may shave face and neck.            Do not bring valuables to the hospital.            [facility] is not responsible for any belongings or valuables.  Do NOT Smoke (Tobacco/Vaping) or drink Alcohol 24 hours prior to your procedure If you use a CPAP at night, you may bring all equipment for your overnight stay.   Contacts, glasses, dentures or bridgework may not be worn into surgery.      For patients admitted to the hospital, discharge time will be determined by your treatment team.   Patients discharged the day of surgery will not be allowed to drive home, and someone needs to stay with them for 24 hours.    Special instructions:   [place]- Preparing For Surgery  Before surgery, you can play an important role. Because skin is not sterile, your skin needs to be as free of germs as possible. You can reduce the number of germs on your skin by washing with CHG (chlorahexidine  gluconate) Soap before surgery.  CHG is an antiseptic cleaner which kills germs and bonds with the skin to continue killing germs even after washing.    Oral Hygiene is also important to reduce your risk of infection.  Remember - BRUSH YOUR TEETH THE MORNING OF SURGERY WITH YOUR REGULAR TOOTHPASTE  Please do not use if you have an allergy to CHG or antibacterial soaps. If your skin becomes reddened/irritated stop using the CHG.  Do not shave (including legs and underarms) for at least 48 hours prior to first CHG shower. It is OK to shave your face.  Please follow these instructions carefully.   1. Shower the NIGHT BEFORE SURGERY and the MORNING OF SURGERY with CHG Soap.   2. If you chose to wash your hair, wash your hair first as usual with your normal shampoo.  3. After you shampoo, rinse your hair and body thoroughly to remove the shampoo.  4. Use CHG as you would any other liquid soap. You can apply CHG directly to the skin and wash gently with a scrungie or a clean washcloth.   5. Apply the CHG Soap to your body ONLY FROM THE  NECK DOWN.  Do not use on open wounds or open sores. Avoid contact with your eyes, ears, mouth and genitals (private parts). Wash Face and genitals (private parts)  with your normal soap.   6. Wash thoroughly, paying special attention to the area where your surgery will be performed.  7. Thoroughly rinse your body with warm water from the neck down.  8. DO NOT shower/wash with your normal soap after using and rinsing off the CHG Soap.  9. Pat yourself dry with a CLEAN TOWEL.  10. Wear CLEAN PAJAMAS to bed the night before surgery  11. Place CLEAN SHEETS on your bed the night of your first shower and DO NOT SLEEP WITH PETS.   Day of Surgery: Wear Clean/Comfortable clothing the morning of surgery Do not apply any deodorants/lotions.   Remember to brush your teeth WITH YOUR REGULAR TOOTHPASTE.   Please read over the following fact sheets that you were  given.

## 2020-02-22 ENCOUNTER — Encounter (HOSPITAL_COMMUNITY)
Admission: RE | Admit: 2020-02-22 | Discharge: 2020-02-22 | Disposition: A | Discharge status: Home / Self Care | Payer: 59 | Source: Ambulatory Visit | Attending: Thoracic Surgery (Cardiothoracic Vascular Surgery) | Admitting: Thoracic Surgery (Cardiothoracic Vascular Surgery)

## 2020-02-22 ENCOUNTER — Encounter (HOSPITAL_COMMUNITY): Payer: Self-pay

## 2020-02-22 ENCOUNTER — Other Ambulatory Visit: Payer: Self-pay

## 2020-02-22 ENCOUNTER — Ambulatory Visit (HOSPITAL_COMMUNITY)
Admission: RE | Admit: 2020-02-22 | Discharge: 2020-02-22 | Disposition: A | Discharge status: Home / Self Care | Payer: 59 | Source: Ambulatory Visit | Attending: Thoracic Surgery (Cardiothoracic Vascular Surgery) | Admitting: Thoracic Surgery (Cardiothoracic Vascular Surgery)

## 2020-02-22 ENCOUNTER — Other Ambulatory Visit (HOSPITAL_COMMUNITY)
Admission: RE | Admit: 2020-02-22 | Discharge: 2020-02-22 | Disposition: A | Discharge status: Home / Self Care | Payer: 59 | Source: Ambulatory Visit | Attending: Thoracic Surgery (Cardiothoracic Vascular Surgery) | Admitting: Thoracic Surgery (Cardiothoracic Vascular Surgery)

## 2020-02-22 DIAGNOSIS — C159 Malignant neoplasm of esophagus, unspecified: Secondary | ICD-10-CM | POA: Insufficient documentation

## 2020-02-22 DIAGNOSIS — Z20822 Contact with and (suspected) exposure to covid-19: Secondary | ICD-10-CM | POA: Insufficient documentation

## 2020-02-22 LAB — CBC
HCT: 37 % — ABNORMAL LOW (ref 39.0–52.0)
Hemoglobin: 11.9 g/dL — ABNORMAL LOW (ref 13.0–17.0)
MCH: 29.5 pg (ref 26.0–34.0)
MCHC: 32.2 g/dL (ref 30.0–36.0)
MCV: 91.8 fL (ref 80.0–100.0)
Platelets: 164 10*3/uL (ref 150–400)
RBC: 4.03 MIL/uL — ABNORMAL LOW (ref 4.22–5.81)
RDW: 22.5 % — ABNORMAL HIGH (ref 11.5–15.5)
WBC: 4.6 10*3/uL (ref 4.0–10.5)
nRBC: 0 % (ref 0.0–0.2)

## 2020-02-22 LAB — COMPREHENSIVE METABOLIC PANEL
ALT: 19 U/L (ref 0–44)
AST: 29 U/L (ref 15–41)
Albumin: 3.7 g/dL (ref 3.5–5.0)
Alkaline Phosphatase: 73 U/L (ref 38–126)
Anion gap: 8 (ref 5–15)
BUN: 20 mg/dL (ref 8–23)
CO2: 26 mmol/L (ref 22–32)
Calcium: 9.3 mg/dL (ref 8.9–10.3)
Chloride: 108 mmol/L (ref 98–111)
Creatinine, Ser: 0.64 mg/dL (ref 0.61–1.24)
GFR, Estimated: >60 mL/min (ref >60)
Glucose, Bld: 111 mg/dL — ABNORMAL HIGH (ref 70–99)
Potassium: 4.4 mmol/L (ref 3.5–5.1)
Sodium: 142 mmol/L (ref 135–145)
Total Bilirubin: 0.5 mg/dL (ref 0.3–1.2)
Total Protein: 6.8 g/dL (ref 6.5–8.1)

## 2020-02-22 LAB — URINALYSIS, ROUTINE W REFLEX MICROSCOPIC
Bilirubin Urine: NEGATIVE
Glucose, UA: NEGATIVE mg/dL
Hgb urine dipstick: NEGATIVE
Ketones, ur: NEGATIVE mg/dL
Leukocytes,Ua: NEGATIVE
Nitrite: NEGATIVE
Protein, ur: NEGATIVE mg/dL
Specific Gravity, Urine: 1.014 (ref 1.005–1.030)
pH: 7 (ref 5.0–8.0)

## 2020-02-22 LAB — BLOOD GAS, ARTERIAL
Acid-Base Excess: 3 mmol/L — ABNORMAL HIGH (ref 0.0–2.0)
Bicarbonate: 27.1 mmol/L (ref 20.0–28.0)
FIO2: 21
O2 Saturation: 97.4 %
Patient temperature: 37
pCO2 arterial: 42.3 mmHg (ref 32.0–48.0)
pH, Arterial: 7.423 (ref 7.350–7.450)
pO2, Arterial: 98.8 mmHg (ref 83.0–108.0)

## 2020-02-22 LAB — APTT: aPTT: 30 seconds (ref 24–36)

## 2020-02-22 LAB — SURGICAL PCR SCREEN
MRSA, PCR: NEGATIVE
Staphylococcus aureus: NEGATIVE

## 2020-02-22 LAB — SARS CORONAVIRUS 2 (TAT 6-24 HRS): SARS Coronavirus 2: NEGATIVE

## 2020-02-22 LAB — PROTIME-INR
INR: 1 (ref 0.8–1.2)
Prothrombin Time: 13.2 seconds (ref 11.4–15.2)

## 2020-02-22 NOTE — Progress Notes (Signed)
PCP - [name] Cardiologist - denies    Chest x-ray - 02/22/20 EKG - 02/22/20 Stress Test - denies ECHO - deneis Cardiac Cath - denies   COVID TEST- 02/22/20   Anesthesia review: yes  Patient denies shortness of breath, fever, cough and chest pain at PAT appointment   All instructions explained to the patient, with a verbal understanding of the material. Patient agrees to go over the instructions while at home for a better understanding. Patient also instructed to self quarantine after being tested for COVID-19. The opportunity to ask questions was provided.

## 2020-02-23 ENCOUNTER — Other Ambulatory Visit: Payer: Self-pay | Admitting: *Deleted

## 2020-02-23 ENCOUNTER — Inpatient Hospital Stay (HOSPITAL_COMMUNITY): Admission: RE | Admit: 2020-02-23 | Payer: 59 | Source: Ambulatory Visit

## 2020-02-23 ENCOUNTER — Telehealth: Payer: Self-pay | Admitting: Hematology

## 2020-02-23 ENCOUNTER — Ambulatory Visit (HOSPITAL_COMMUNITY)
Admission: RE | Admit: 2020-02-23 | Discharge: 2020-02-23 | Disposition: A | Discharge status: Home / Self Care | Payer: 59 | Source: Ambulatory Visit | Attending: Thoracic Surgery (Cardiothoracic Vascular Surgery) | Admitting: Thoracic Surgery (Cardiothoracic Vascular Surgery)

## 2020-02-23 DIAGNOSIS — C155 Malignant neoplasm of lower third of esophagus: Secondary | ICD-10-CM | POA: Insufficient documentation

## 2020-02-23 LAB — PULMONARY FUNCTION TEST
DL/VA % pred: 107 %
DL/VA: 4.48 ml/min/mmHg/L
DLCO cor % pred: 113 %
DLCO cor: 31.6 ml/min/mmHg
DLCO unc % pred: 103 %
DLCO unc: 28.9 ml/min/mmHg
FEF 25-75 Post: 2.97 L/sec
FEF 25-75 Pre: 1.31 L/sec
FEF2575-%Change-Post: 126 %
FEF2575-%Pred-Post: 101 %
FEF2575-%Pred-Pre: 45 %
FEV1-%Change-Post: 32 %
FEV1-%Pred-Post: 89 %
FEV1-%Pred-Pre: 67 %
FEV1-Post: 3.26 L
FEV1-Pre: 2.46 L
FEV1FVC-%Change-Post: 10 %
FEV1FVC-%Pred-Pre: 80 %
FEV6-%Change-Post: 18 %
FEV6-%Pred-Post: 101 %
FEV6-%Pred-Pre: 84 %
FEV6-Post: 4.66 L
FEV6-Pre: 3.92 L
FEV6FVC-%Change-Post: -1 %
FEV6FVC-%Pred-Post: 99 %
FEV6FVC-%Pred-Pre: 100 %
FVC-%Change-Post: 20 %
FVC-%Pred-Post: 101 %
FVC-%Pred-Pre: 84 %
FVC-Post: 4.92 L
FVC-Pre: 4.08 L
Post FEV1/FVC ratio: 66 %
Post FEV6/FVC ratio: 95 %
Pre FEV1/FVC ratio: 60 %
Pre FEV6/FVC Ratio: 96 %
RV % pred: 228 %
RV: 5.38 L
TLC % pred: 131 %
TLC: 9.51 L

## 2020-02-23 MED ORDER — SODIUM CHLORIDE 0.9 % IV SOLN
2.0000 g | INTRAVENOUS | Status: AC
  Administered 2020-02-24 (×6): 2 g via INTRAVENOUS
  Filled 2020-02-23: qty 2

## 2020-02-23 MED ORDER — ALBUTEROL SULFATE (2.5 MG/3ML) 0.083% IN NEBU
2.5000 mg | INHALATION_SOLUTION | Freq: Once | RESPIRATORY_TRACT | Status: AC
  Administered 2020-02-23: 2.5 mg via RESPIRATORY_TRACT

## 2020-02-23 NOTE — Telephone Encounter (Signed)
Scheduled per sch msg. Called and spoke with patient. Confirmed appt  

## 2020-02-23 NOTE — Progress Notes (Signed)
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  He has had concurrent chemoradiation per notes he is doing quite well with this.  He has maintained his appetite and his reportedly eating well.  Not currently having any significant issues swallowing, tolerating normal diet, dysphagia resolved after chemoradiation.  Prior history of alcohol abuse, per notes he has recently cut back significantly.  Seen by Dr. Kipp Brood 02/18/2020 surgery was discussed.  Per note, cardiac stress test was ordered for preoperative stratification.  However, patient's insurance did not approve the study.  I spoke with Levonne Spiller, RN in Dr. Abran Duke office regarding this.  Dr. Kipp Brood reviewed the case and given that the patient does not have any history of anginal symptoms and no known history of CAD he felt was okay to proceed without this test.  Preop labs reviewed, mild anemia with hemoglobin 11.9, otherwise unremarkable.  EKG 02/22/2020: NSR.  Rate 68.  Incomplete right bundle block.  No significant change since last tracing.  CHEST - 2 VIEW 02/22/2020: COMPARISON:  PET CT 02/17/2020  FINDINGS: No focal opacity or pleural effusion. Normal cardiomediastinal silhouette. Coil like opacity over the right shoulder is presumably artifact. No pneumothorax. There are degenerative changes of the spine.  IMPRESSION: No active cardiopulmonary disease.  Wynonia Musty [facility] Short Stay Center/Anesthesiology Phone [phone] 02/23/2020 10:00 AM

## 2020-02-23 NOTE — Patient Outreach (Signed)
[facility]) Care Management  02/23/2020  Brett Dawson [birth year] [id number]   [facility] follow up outreach Brett Dawson was referred to [facility] on 11/24/19 Referral [place] Referral Reason:esophageal tumor with invasion intocardia  Insurance:[facility] [employer] Admissions x1in last 6 months  11/23/19 EGD with upper endoscopy Dr [name] 7/16/21AnotherFeraheme dosegiven Admission5/14/21 to 09/19/19 syncopeHgb6.7 given blood+Ferahemeon 09/18/19 dxsystemic iron deficiency anemia observed and Hgb increased to 7.7 followed by [facility] gastroenterology    Follow up assessment  Patient is able to verify HIPAA identifiers  Brett Dawson report he is doing well and is on his way to work and then to a 3 pm stress test in preparation for his scheduled surgery on 02/24/20  He and [facility] RN CM agreed to follow up after his surgery No needs identified today   [facility] RN CM will follow up with Brett Dawson within the next 14-21 business days Pt encouraged to return a call to [facility] RN CM prn  Joelene Millin L. Lavina Hamman, RN, BSN, [place] Coordinator Office number [phone] Main [facility] number [phone] Fax number [phone]

## 2020-02-23 NOTE — Anesthesia Preprocedure Evaluation (Addendum)
Anesthesia Evaluation  Patient identified by MRN, date of birth, ID band Patient awake    Reviewed: Allergy & Precautions, NPO status , Patient's Chart, lab work & pertinent test results  Airway Mallampati: II  TM Distance: >3 FB Neck ROM: Full    Dental  (+) Chipped, Poor Dentition, Missing, Loose,    Pulmonary neg pulmonary ROS,    Pulmonary exam normal breath sounds clear to auscultation       Cardiovascular negative cardio ROS Normal cardiovascular exam Rhythm:Regular Rate:Normal     Neuro/Psych negative neurological ROS  negative psych ROS   GI/Hepatic (+)     substance abuse  alcohol use and marijuana use, Malignant neoplasm of lower third of esophagus    Endo/Other  Obesity   Renal/GU negative Renal ROS     Musculoskeletal negative musculoskeletal ROS (+)   Abdominal   Peds  Hematology  (+) Blood dyscrasia, anemia ,   Anesthesia Other Findings Day of surgery medications reviewed with the patient.  Reproductive/Obstetrics                            Anesthesia Physical Anesthesia Plan  ASA: III  Anesthesia Plan: General   Post-op Pain Management:    Induction: Intravenous  PONV Risk Score and Plan: 2 and Dexamethasone, Ondansetron and Midazolam  Airway Management Planned: Oral ETT and Double Lumen EBT  Additional Equipment: Arterial line and CVP  Intra-op Plan:   Post-operative Plan: Possible Post-op intubation/ventilation  Informed Consent: I have reviewed the patients History and Physical, chart, labs and discussed the procedure including the risks, benefits and alternatives for the proposed anesthesia with the patient or authorized representative who has indicated his/her understanding and acceptance.       Plan Discussed with: CRNA  Anesthesia Plan Comments: (PAT note by Karoline Caldwell, PA-C: Follows with oncology for history of squamous cell carcinoma in the  distal esophagus.  He has had concurrent chemoradiation per notes he is doing quite well with this.  He has maintained his appetite and his reportedly eating well.  Not currently having any significant issues swallowing, tolerating normal diet, dysphagia resolved after chemoradiation.  Prior history of alcohol abuse, per notes he has recently cut back significantly.  Seen by Dr. Kipp Brood 02/18/2020 surgery was discussed.  Per note, cardiac stress test was ordered for preoperative stratification.  However, patient's insurance did not approve the study.  I spoke with Levonne Spiller, RN in Dr. Abran Duke office regarding this.  Dr. Kipp Brood reviewed the case and given that the patient does not have any history of anginal symptoms and no known history of CAD he felt was okay to proceed without this test.  Preop labs reviewed, mild anemia with hemoglobin 11.9, otherwise unremarkable.  EKG 02/22/2020: NSR.  Rate 68.  Incomplete right bundle block.  No significant change since last tracing.  CHEST - 2 VIEW 02/22/2020: COMPARISON:  PET CT 02/17/2020  FINDINGS: No focal opacity or pleural effusion. Normal cardiomediastinal silhouette. Coil like opacity over the right shoulder is presumably artifact. No pneumothorax. There are degenerative changes of the spine.  IMPRESSION: No active cardiopulmonary disease. )       Anesthesia Quick Evaluation

## 2020-02-24 ENCOUNTER — Inpatient Hospital Stay (HOSPITAL_COMMUNITY): Payer: 59 | Admitting: Physician Assistant

## 2020-02-24 ENCOUNTER — Encounter (HOSPITAL_COMMUNITY): Payer: Self-pay | Admitting: Thoracic Surgery (Cardiothoracic Vascular Surgery)

## 2020-02-24 ENCOUNTER — Inpatient Hospital Stay (HOSPITAL_COMMUNITY): Payer: 59 | Admitting: Anesthesiology

## 2020-02-24 ENCOUNTER — Other Ambulatory Visit: Payer: Self-pay

## 2020-02-24 ENCOUNTER — Inpatient Hospital Stay (HOSPITAL_COMMUNITY): Payer: 59

## 2020-02-24 ENCOUNTER — Inpatient Hospital Stay: Payer: 59

## 2020-02-24 ENCOUNTER — Inpatient Hospital Stay: Payer: 59 | Admitting: Hematology

## 2020-02-24 ENCOUNTER — Inpatient Hospital Stay (HOSPITAL_COMMUNITY)
Admission: RE | Admit: 2020-02-24 | Discharge: 2020-04-21 | DRG: 326 | Disposition: A | Discharge status: Home Health | Payer: 59 | Attending: Thoracic Surgery (Cardiothoracic Vascular Surgery) | Admitting: Thoracic Surgery (Cardiothoracic Vascular Surgery)

## 2020-02-24 ENCOUNTER — Encounter (HOSPITAL_COMMUNITY)
Admission: RE | Disposition: A | Discharge status: Home Health | Payer: Self-pay | Source: Home / Self Care | Attending: Thoracic Surgery (Cardiothoracic Vascular Surgery)

## 2020-02-24 DIAGNOSIS — D62 Acute posthemorrhagic anemia: Secondary | ICD-10-CM | POA: Diagnosis not present

## 2020-02-24 DIAGNOSIS — J189 Pneumonia, unspecified organism: Secondary | ICD-10-CM

## 2020-02-24 DIAGNOSIS — R739 Hyperglycemia, unspecified: Secondary | ICD-10-CM | POA: Diagnosis not present

## 2020-02-24 DIAGNOSIS — R0902 Hypoxemia: Secondary | ICD-10-CM

## 2020-02-24 DIAGNOSIS — D6959 Other secondary thrombocytopenia: Secondary | ICD-10-CM | POA: Diagnosis not present

## 2020-02-24 DIAGNOSIS — Z938 Other artificial opening status: Secondary | ICD-10-CM

## 2020-02-24 DIAGNOSIS — E46 Unspecified protein-calorie malnutrition: Secondary | ICD-10-CM | POA: Diagnosis present

## 2020-02-24 DIAGNOSIS — B964 Proteus (mirabilis) (morganii) as the cause of diseases classified elsewhere: Secondary | ICD-10-CM | POA: Diagnosis present

## 2020-02-24 DIAGNOSIS — J9382 Other air leak: Secondary | ICD-10-CM | POA: Diagnosis not present

## 2020-02-24 DIAGNOSIS — Z09 Encounter for follow-up examination after completed treatment for conditions other than malignant neoplasm: Secondary | ICD-10-CM

## 2020-02-24 DIAGNOSIS — Z6830 Body mass index (BMI) 30.0-30.9, adult: Secondary | ICD-10-CM

## 2020-02-24 DIAGNOSIS — K9189 Other postprocedural complications and disorders of digestive system: Secondary | ICD-10-CM

## 2020-02-24 DIAGNOSIS — R059 Cough, unspecified: Secondary | ICD-10-CM

## 2020-02-24 DIAGNOSIS — J940 Chylous effusion: Secondary | ICD-10-CM | POA: Diagnosis not present

## 2020-02-24 DIAGNOSIS — R0602 Shortness of breath: Secondary | ICD-10-CM

## 2020-02-24 DIAGNOSIS — K9412 Enterostomy infection: Secondary | ICD-10-CM | POA: Diagnosis not present

## 2020-02-24 DIAGNOSIS — L03319 Cellulitis of trunk, unspecified: Secondary | ICD-10-CM | POA: Diagnosis not present

## 2020-02-24 DIAGNOSIS — E43 Unspecified severe protein-calorie malnutrition: Secondary | ICD-10-CM

## 2020-02-24 DIAGNOSIS — F1010 Alcohol abuse, uncomplicated: Secondary | ICD-10-CM | POA: Diagnosis present

## 2020-02-24 DIAGNOSIS — K5900 Constipation, unspecified: Secondary | ICD-10-CM | POA: Diagnosis present

## 2020-02-24 DIAGNOSIS — Z4659 Encounter for fitting and adjustment of other gastrointestinal appliance and device: Secondary | ICD-10-CM

## 2020-02-24 DIAGNOSIS — C155 Malignant neoplasm of lower third of esophagus: Secondary | ICD-10-CM | POA: Diagnosis present

## 2020-02-24 DIAGNOSIS — E781 Pure hyperglyceridemia: Secondary | ICD-10-CM | POA: Diagnosis not present

## 2020-02-24 DIAGNOSIS — Z9689 Presence of other specified functional implants: Secondary | ICD-10-CM

## 2020-02-24 DIAGNOSIS — E871 Hypo-osmolality and hyponatremia: Secondary | ICD-10-CM | POA: Diagnosis not present

## 2020-02-24 DIAGNOSIS — Z9889 Other specified postprocedural states: Secondary | ICD-10-CM

## 2020-02-24 DIAGNOSIS — Z20822 Contact with and (suspected) exposure to covid-19: Secondary | ICD-10-CM | POA: Diagnosis present

## 2020-02-24 DIAGNOSIS — Z4682 Encounter for fitting and adjustment of non-vascular catheter: Secondary | ICD-10-CM

## 2020-02-24 DIAGNOSIS — J869 Pyothorax without fistula: Secondary | ICD-10-CM | POA: Diagnosis not present

## 2020-02-24 DIAGNOSIS — J939 Pneumothorax, unspecified: Secondary | ICD-10-CM

## 2020-02-24 DIAGNOSIS — Z9049 Acquired absence of other specified parts of digestive tract: Secondary | ICD-10-CM

## 2020-02-24 DIAGNOSIS — F1210 Cannabis abuse, uncomplicated: Secondary | ICD-10-CM | POA: Diagnosis present

## 2020-02-24 DIAGNOSIS — J9811 Atelectasis: Secondary | ICD-10-CM

## 2020-02-24 DIAGNOSIS — J948 Other specified pleural conditions: Secondary | ICD-10-CM | POA: Diagnosis not present

## 2020-02-24 DIAGNOSIS — E669 Obesity, unspecified: Secondary | ICD-10-CM | POA: Diagnosis present

## 2020-02-24 DIAGNOSIS — E440 Moderate protein-calorie malnutrition: Secondary | ICD-10-CM | POA: Insufficient documentation

## 2020-02-24 DIAGNOSIS — Z419 Encounter for procedure for purposes other than remedying health state, unspecified: Secondary | ICD-10-CM

## 2020-02-24 DIAGNOSIS — C159 Malignant neoplasm of esophagus, unspecified: Secondary | ICD-10-CM

## 2020-02-24 DIAGNOSIS — J90 Pleural effusion, not elsewhere classified: Secondary | ICD-10-CM

## 2020-02-24 HISTORY — PX: ESOPHAGOGASTRODUODENOSCOPY: SHX5428

## 2020-02-24 HISTORY — PX: INTERCOSTAL NERVE BLOCK: SHX5021

## 2020-02-24 LAB — POCT I-STAT 7, (LYTES, BLD GAS, ICA,H+H)
Acid-Base Excess: 0 mmol/L (ref 0.0–2.0)
Acid-base deficit: 1 mmol/L (ref 0.0–2.0)
Bicarbonate: 25.5 mmol/L (ref 20.0–28.0)
Bicarbonate: 25.9 mmol/L (ref 20.0–28.0)
Calcium, Ion: 1.11 mmol/L — ABNORMAL LOW (ref 1.15–1.40)
Calcium, Ion: 1.18 mmol/L (ref 1.15–1.40)
HCT: 30 % — ABNORMAL LOW (ref 39.0–52.0)
HCT: 30 % — ABNORMAL LOW (ref 39.0–52.0)
Hemoglobin: 10.2 g/dL — ABNORMAL LOW (ref 13.0–17.0)
Hemoglobin: 10.2 g/dL — ABNORMAL LOW (ref 13.0–17.0)
O2 Saturation: 90 %
O2 Saturation: 98 %
Patient temperature: 36
Patient temperature: 36.7
Potassium: 4.4 mmol/L (ref 3.5–5.1)
Potassium: 3.8 mmol/L (ref 3.5–5.1)
Sodium: 142 mmol/L (ref 135–145)
Sodium: 141 mmol/L (ref 135–145)
TCO2: 27 mmol/L (ref 22–32)
TCO2: 27 mmol/L (ref 22–32)
pCO2 arterial: 49.9 mmHg — ABNORMAL HIGH (ref 32.0–48.0)
pCO2 arterial: 44 mmHg (ref 32.0–48.0)
pH, Arterial: 7.31 — ABNORMAL LOW (ref 7.350–7.450)
pH, Arterial: 7.377 (ref 7.350–7.450)
pO2, Arterial: 61 mmHg — ABNORMAL LOW (ref 83.0–108.0)
pO2, Arterial: 106 mmHg (ref 83.0–108.0)

## 2020-02-24 LAB — BASIC METABOLIC PANEL
Anion gap: 11 (ref 5–15)
BUN: 12 mg/dL (ref 8–23)
CO2: 23 mmol/L (ref 22–32)
Calcium: 8.9 mg/dL (ref 8.9–10.3)
Chloride: 106 mmol/L (ref 98–111)
Creatinine, Ser: 1.11 mg/dL (ref 0.61–1.24)
GFR, Estimated: >60 mL/min (ref >60)
Glucose, Bld: 160 mg/dL — ABNORMAL HIGH (ref 70–99)
Potassium: 3.9 mmol/L (ref 3.5–5.1)
Sodium: 140 mmol/L (ref 135–145)

## 2020-02-24 LAB — CBC
HCT: 31.5 % — ABNORMAL LOW (ref 39.0–52.0)
Hemoglobin: 10 g/dL — ABNORMAL LOW (ref 13.0–17.0)
MCH: 29.1 pg (ref 26.0–34.0)
MCHC: 31.7 g/dL (ref 30.0–36.0)
MCV: 91.6 fL (ref 80.0–100.0)
Platelets: 142 10*3/uL — ABNORMAL LOW (ref 150–400)
RBC: 3.44 MIL/uL — ABNORMAL LOW (ref 4.22–5.81)
RDW: 22.2 % — ABNORMAL HIGH (ref 11.5–15.5)
WBC: 10.2 10*3/uL (ref 4.0–10.5)
nRBC: 0 % (ref 0.0–0.2)

## 2020-02-24 LAB — GLUCOSE, CAPILLARY: Glucose-Capillary: 155 mg/dL — ABNORMAL HIGH (ref 70–99)

## 2020-02-24 LAB — PREPARE RBC (CROSSMATCH)

## 2020-02-24 SURGERY — ESOPHAGECTOMY, ROBOT-ASSISTED
Anesthesia: General | Site: Mouth | Laterality: Right

## 2020-02-24 MED ORDER — PANTOPRAZOLE SODIUM 40 MG IV SOLR
40.0000 mg | Freq: Two times a day (BID) | INTRAVENOUS | Status: DC
  Administered 2020-02-24 – 2020-03-15 (×40): 40 mg via INTRAVENOUS
  Filled 2020-02-24 (×40): qty 40

## 2020-02-24 MED ORDER — ACETAMINOPHEN 10 MG/ML IV SOLN
INTRAVENOUS | Status: AC
  Filled 2020-02-24: qty 100

## 2020-02-24 MED ORDER — ROCURONIUM BROMIDE 10 MG/ML (PF) SYRINGE
PREFILLED_SYRINGE | INTRAVENOUS | Status: AC
  Filled 2020-02-24: qty 10

## 2020-02-24 MED ORDER — CHLORHEXIDINE GLUCONATE 0.12 % MT SOLN
OROMUCOSAL | Status: AC
  Filled 2020-02-24: qty 15

## 2020-02-24 MED ORDER — SUGAMMADEX SODIUM 200 MG/2ML IV SOLN
INTRAVENOUS | Status: DC | PRN
  Administered 2020-02-24: 200 mg via INTRAVENOUS

## 2020-02-24 MED ORDER — ONDANSETRON HCL 4 MG/2ML IJ SOLN
INTRAMUSCULAR | Status: DC | PRN
  Administered 2020-02-24: 4 mg via INTRAVENOUS

## 2020-02-24 MED ORDER — FENTANYL CITRATE (PF) 250 MCG/5ML IJ SOLN
INTRAMUSCULAR | Status: AC
  Filled 2020-02-24: qty 5

## 2020-02-24 MED ORDER — SODIUM CHLORIDE 0.9 % IV SOLN
2.0000 g | INTRAVENOUS | Status: AC
  Filled 2020-02-24 (×6): qty 2

## 2020-02-24 MED ORDER — SODIUM CHLORIDE 0.9 % IV SOLN
2.0000 g | Freq: Four times a day (QID) | INTRAVENOUS | Status: AC
  Administered 2020-02-25 (×3): 2 g via INTRAVENOUS
  Filled 2020-02-24 (×3): qty 2

## 2020-02-24 MED ORDER — PHENYLEPHRINE 40 MCG/ML (10ML) SYRINGE FOR IV PUSH (FOR BLOOD PRESSURE SUPPORT)
PREFILLED_SYRINGE | INTRAVENOUS | Status: DC | PRN
  Administered 2020-02-24: 80 ug via INTRAVENOUS
  Administered 2020-02-24: 120 ug via INTRAVENOUS
  Administered 2020-02-24 (×4): 80 ug via INTRAVENOUS

## 2020-02-24 MED ORDER — ONDANSETRON HCL 4 MG/2ML IJ SOLN
4.0000 mg | INTRAMUSCULAR | Status: DC | PRN
  Filled 2020-02-24: qty 2

## 2020-02-24 MED ORDER — LIDOCAINE 2% (20 MG/ML) 5 ML SYRINGE
INTRAMUSCULAR | Status: AC
  Filled 2020-02-24: qty 5

## 2020-02-24 MED ORDER — BUPIVACAINE LIPOSOME 1.3 % IJ SUSP
20.0000 mL | Freq: Once | INTRAMUSCULAR | Status: DC
  Filled 2020-02-24: qty 20

## 2020-02-24 MED ORDER — FUROSEMIDE 10 MG/ML IJ SOLN
INTRAMUSCULAR | Status: DC | PRN
  Administered 2020-02-24: 40 mg via INTRAMUSCULAR

## 2020-02-24 MED ORDER — DEXTROSE-NACL 5-0.9 % IV SOLN: INTRAVENOUS | Status: DC

## 2020-02-24 MED ORDER — DEXMEDETOMIDINE HCL IN NACL 400 MCG/100ML IV SOLN
0.0000 ug/kg/h | INTRAVENOUS | Status: DC
  Administered 2020-02-24: 0.7 ug/kg/h via INTRAVENOUS
  Filled 2020-02-24 (×2): qty 100

## 2020-02-24 MED ORDER — ALBUMIN HUMAN 5 % IV SOLN: INTRAVENOUS | Status: DC | PRN

## 2020-02-24 MED ORDER — DEXAMETHASONE SODIUM PHOSPHATE 10 MG/ML IJ SOLN
INTRAMUSCULAR | Status: AC
  Filled 2020-02-24: qty 1

## 2020-02-24 MED ORDER — PROPOFOL 10 MG/ML IV BOLUS
INTRAVENOUS | Status: DC | PRN
  Administered 2020-02-24: 10 mg via INTRAVENOUS
  Administered 2020-02-24: 150 mg via INTRAVENOUS

## 2020-02-24 MED ORDER — 0.9 % SODIUM CHLORIDE (POUR BTL) OPTIME
TOPICAL | Status: DC | PRN
  Administered 2020-02-24: 3000 mL

## 2020-02-24 MED ORDER — LACTATED RINGERS IV SOLN: INTRAVENOUS | Status: DC

## 2020-02-24 MED ORDER — LIDOCAINE 2% (20 MG/ML) 5 ML SYRINGE
INTRAMUSCULAR | Status: DC | PRN
  Administered 2020-02-24: 100 mg via INTRAVENOUS

## 2020-02-24 MED ORDER — CHLORHEXIDINE GLUCONATE 0.12 % MT SOLN
15.0000 mL | OROMUCOSAL | Status: AC
  Filled 2020-02-24: qty 15

## 2020-02-24 MED ORDER — KETAMINE HCL 50 MG/5ML IJ SOSY
PREFILLED_SYRINGE | INTRAMUSCULAR | Status: AC
  Filled 2020-02-24: qty 10

## 2020-02-24 MED ORDER — MORPHINE SULFATE (PF) 2 MG/ML IV SOLN
1.0000 mg | INTRAVENOUS | Status: DC | PRN
  Administered 2020-03-08 – 2020-03-09 (×2): 2 mg via INTRAVENOUS
  Filled 2020-02-24 (×2): qty 1

## 2020-02-24 MED ORDER — ROCURONIUM BROMIDE 10 MG/ML (PF) SYRINGE
PREFILLED_SYRINGE | INTRAVENOUS | Status: DC | PRN
  Administered 2020-02-24 (×2): 50 mg via INTRAVENOUS
  Administered 2020-02-24: 30 mg via INTRAVENOUS
  Administered 2020-02-24: 50 mg via INTRAVENOUS
  Administered 2020-02-24: 70 mg via INTRAVENOUS
  Administered 2020-02-24: 20 mg via INTRAVENOUS
  Administered 2020-02-24: 30 mg via INTRAVENOUS
  Administered 2020-02-24: 20 mg via INTRAVENOUS
  Administered 2020-02-24: 50 mg via INTRAVENOUS

## 2020-02-24 MED ORDER — PROPOFOL 10 MG/ML IV BOLUS
INTRAVENOUS | Status: AC
  Filled 2020-02-24: qty 40

## 2020-02-24 MED ORDER — DEXAMETHASONE SODIUM PHOSPHATE 10 MG/ML IJ SOLN
INTRAMUSCULAR | Status: DC | PRN
  Administered 2020-02-24: 10 mg via INTRAVENOUS

## 2020-02-24 MED ORDER — INDOCYANINE GREEN 25 MG IV SOLR
INTRAVENOUS | Status: DC | PRN
  Administered 2020-02-24: 15 mg via INTRAVENOUS

## 2020-02-24 MED ORDER — PHENYLEPHRINE 40 MCG/ML (10ML) SYRINGE FOR IV PUSH (FOR BLOOD PRESSURE SUPPORT)
PREFILLED_SYRINGE | INTRAVENOUS | Status: AC
  Filled 2020-02-24: qty 10

## 2020-02-24 MED ORDER — FENTANYL CITRATE (PF) 250 MCG/5ML IJ SOLN
INTRAMUSCULAR | Status: DC | PRN
  Administered 2020-02-24 (×2): 100 ug via INTRAVENOUS
  Administered 2020-02-24 (×4): 50 ug via INTRAVENOUS

## 2020-02-24 MED ORDER — PHENYLEPHRINE 40 MCG/ML (10ML) SYRINGE FOR IV PUSH (FOR BLOOD PRESSURE SUPPORT)
PREFILLED_SYRINGE | INTRAVENOUS | Status: AC
  Filled 2020-02-24: qty 20

## 2020-02-24 MED ORDER — INSULIN ASPART 100 UNIT/ML ~~LOC~~ SOLN
0.0000 [IU] | SUBCUTANEOUS | Status: DC
  Administered 2020-02-25 – 2020-03-03 (×16): 2 [IU] via SUBCUTANEOUS
  Administered 2020-03-03: 4 [IU] via SUBCUTANEOUS
  Administered 2020-03-04 – 2020-03-05 (×9): 2 [IU] via SUBCUTANEOUS
  Administered 2020-03-05: 4 [IU] via SUBCUTANEOUS
  Administered 2020-03-05 – 2020-03-13 (×21): 2 [IU] via SUBCUTANEOUS
  Administered 2020-03-13: 4 [IU] via SUBCUTANEOUS
  Administered 2020-03-13 – 2020-03-14 (×4): 2 [IU] via SUBCUTANEOUS

## 2020-02-24 MED ORDER — ONDANSETRON HCL 4 MG/2ML IJ SOLN
INTRAMUSCULAR | Status: AC
  Filled 2020-02-24: qty 2

## 2020-02-24 MED ORDER — SODIUM CHLORIDE FLUSH 0.9 % IV SOLN
INTRAVENOUS | Status: DC | PRN
  Administered 2020-02-24: 100 mL

## 2020-02-24 MED ORDER — CALCIUM CHLORIDE 10 % IV SOLN
INTRAVENOUS | Status: AC
  Filled 2020-02-24: qty 10

## 2020-02-24 MED ORDER — EPHEDRINE SULFATE-NACL 50-0.9 MG/10ML-% IV SOSY
PREFILLED_SYRINGE | INTRAVENOUS | Status: DC | PRN
  Administered 2020-02-24: 5 mg via INTRAVENOUS

## 2020-02-24 MED ORDER — CALCIUM CHLORIDE 10 % IV SOLN
INTRAVENOUS | Status: DC | PRN
  Administered 2020-02-24: 200 mg via INTRAVENOUS

## 2020-02-24 MED ORDER — BUPIVACAINE HCL (PF) 0.5 % IJ SOLN
INTRAMUSCULAR | Status: AC
  Filled 2020-02-24: qty 30

## 2020-02-24 MED ORDER — FUROSEMIDE 10 MG/ML IJ SOLN
INTRAMUSCULAR | Status: AC
  Filled 2020-02-24: qty 4

## 2020-02-24 MED ORDER — KETOROLAC TROMETHAMINE 15 MG/ML IJ SOLN
15.0000 mg | Freq: Four times a day (QID) | INTRAMUSCULAR | Status: DC | PRN
  Administered 2020-02-25: 15 mg via INTRAVENOUS
  Filled 2020-02-24: qty 1

## 2020-02-24 MED ORDER — CHLORHEXIDINE GLUCONATE CLOTH 2 % EX PADS
6.0000 | MEDICATED_PAD | Freq: Every day | CUTANEOUS | Status: DC
  Administered 2020-02-25 – 2020-02-28 (×4): 6 via TOPICAL

## 2020-02-24 MED ORDER — ORAL CARE MOUTH RINSE: 15.0000 mL | Freq: Once | OROMUCOSAL | Status: DC

## 2020-02-24 MED ORDER — KETAMINE HCL 10 MG/ML IJ SOLN
INTRAMUSCULAR | Status: DC | PRN
  Administered 2020-02-24 (×2): 10 mg via INTRAVENOUS
  Administered 2020-02-24: 40 mg via INTRAVENOUS
  Administered 2020-02-24 (×4): 10 mg via INTRAVENOUS

## 2020-02-24 MED ORDER — DEXMEDETOMIDINE HCL IN NACL 400 MCG/100ML IV SOLN
INTRAVENOUS | Status: DC | PRN
  Administered 2020-02-24: .4 ug/kg/h via INTRAVENOUS

## 2020-02-24 MED ORDER — PHENYLEPHRINE HCL-NACL 10-0.9 MG/250ML-% IV SOLN
INTRAVENOUS | Status: DC | PRN
  Administered 2020-02-24: 15 ug/min via INTRAVENOUS

## 2020-02-24 MED ORDER — CHLORHEXIDINE GLUCONATE 0.12 % MT SOLN: 15.0000 mL | Freq: Once | OROMUCOSAL | Status: DC

## 2020-02-24 MED ORDER — MIDAZOLAM HCL 2 MG/2ML IJ SOLN
INTRAMUSCULAR | Status: AC
  Filled 2020-02-24: qty 2

## 2020-02-24 MED ORDER — MIDAZOLAM HCL 2 MG/2ML IJ SOLN
INTRAMUSCULAR | Status: DC | PRN
  Administered 2020-02-24: 2 mg via INTRAVENOUS

## 2020-02-24 MED ORDER — INDOCYANINE GREEN 25 MG IV SOLR
INTRAVENOUS | Status: AC
  Filled 2020-02-24: qty 10

## 2020-02-24 MED ORDER — ALBUTEROL SULFATE (2.5 MG/3ML) 0.083% IN NEBU: 2.5000 mg | INHALATION_SOLUTION | Freq: Four times a day (QID) | RESPIRATORY_TRACT | Status: AC | PRN

## 2020-02-24 MED ORDER — ACETAMINOPHEN 10 MG/ML IV SOLN
INTRAVENOUS | Status: DC | PRN
  Administered 2020-02-24: 1000 mg via INTRAVENOUS

## 2020-02-24 MED ORDER — KETOROLAC TROMETHAMINE 30 MG/ML IJ SOLN
INTRAMUSCULAR | Status: DC | PRN
  Administered 2020-02-24: 30 mg via INTRAVENOUS

## 2020-02-24 MED ORDER — LACTATED RINGERS IV SOLN: INTRAVENOUS | Status: DC | PRN

## 2020-02-24 SURGICAL SUPPLY — 155 items
ADH SKN CLS APL DERMABOND .7 (GAUZE/BANDAGES/DRESSINGS) ×8
APL PRP STRL LF DISP 70% ISPRP (MISCELLANEOUS) ×8
BAG DRN RND TRDRP ANRFLXCHMBR (UROLOGICAL SUPPLIES) ×4
BAG URINE DRAIN 2000ML AR STRL (UROLOGICAL SUPPLIES) ×3 IMPLANT
BLADE CLIPPER SURG (BLADE) ×6 IMPLANT
BLADE SURG 11 STRL SS (BLADE) ×6 IMPLANT
CANISTER SUCT 3000ML PPV (MISCELLANEOUS) ×12 IMPLANT
CANNULA REDUC XI 12-8 STAPL (CANNULA) ×10
CANNULA REDUC XI 12-8MM STAPL (CANNULA) ×2
CANNULA REDUCER 12-8 DVNC XI (CANNULA) ×5 IMPLANT
CATH THORACIC 28FR (CATHETERS) ×2 IMPLANT
CHLORAPREP W/TINT 26 (MISCELLANEOUS) ×12 IMPLANT
CNTNR URN SCR LID CUP LEK RST (MISCELLANEOUS) ×5 IMPLANT
CONN ST 1/4X3/8  BEN (MISCELLANEOUS) ×6
CONN ST 1/4X3/8 BEN (MISCELLANEOUS) ×4 IMPLANT
CONT SPEC 4OZ STRL OR WHT (MISCELLANEOUS) ×12
COVER TIP SHEARS 8 DVNC (MISCELLANEOUS) ×4 IMPLANT
COVER TIP SHEARS 8MM DA VINCI (MISCELLANEOUS) ×6
DEFOGGER SCOPE WARMER CLEARIFY (MISCELLANEOUS) ×6 IMPLANT
DERMABOND ADVANCED (GAUZE/BANDAGES/DRESSINGS) ×4
DERMABOND ADVANCED .7 DNX12 (GAUZE/BANDAGES/DRESSINGS) ×8 IMPLANT
DEVICE SUTURE ENDOST 10MM (ENDOMECHANICALS) ×6 IMPLANT
DRAIN CHANNEL 19F RND (DRAIN) ×6 IMPLANT
DRAIN CHANNEL 28F RND 3/8 FF (WOUND CARE) IMPLANT
DRAIN PENROSE 1/4X12 LTX STRL (WOUND CARE) ×6 IMPLANT
DRAPE ARM DVNC X/XI (DISPOSABLE) ×16 IMPLANT
DRAPE COLUMN DVNC XI (DISPOSABLE) ×4 IMPLANT
DRAPE CV SPLIT W-CLR ANES SCRN (DRAPES) ×12 IMPLANT
DRAPE DA VINCI XI ARM (DISPOSABLE) ×24
DRAPE DA VINCI XI COLUMN (DISPOSABLE) ×6
DRAPE HALF SHEET 40X57 (DRAPES) ×12 IMPLANT
DRAPE INCISE IOBAN 66X45 STRL (DRAPES) ×15 IMPLANT
DRAPE ORTHO SPLIT 77X108 STRL (DRAPES) ×12
DRAPE SURG ORHT 6 SPLT 77X108 (DRAPES) ×8 IMPLANT
ELECT REM PT RETURN 9FT ADLT (ELECTROSURGICAL) ×6
ELECTRODE REM PT RTRN 9FT ADLT (ELECTROSURGICAL) ×4 IMPLANT
EVACUATOR SILICONE 100CC (DRAIN) ×2 IMPLANT
FELT TEFLON 1X6 (MISCELLANEOUS) ×3 IMPLANT
GAUZE KITTNER 4X5 RF (MISCELLANEOUS) ×8 IMPLANT
GAUZE SPONGE 4X4 12PLY STRL (GAUZE/BANDAGES/DRESSINGS) ×8 IMPLANT
GLOVE BIO SURGEON STRL SZ 6.5 (GLOVE) ×1 IMPLANT
GLOVE BIO SURGEON STRL SZ7 (GLOVE) ×6 IMPLANT
GLOVE BIO SURGEON STRL SZ7.5 (GLOVE) ×33 IMPLANT
GLOVE BIO SURGEON STRL SZ8.5 (GLOVE) ×10 IMPLANT
GLOVE BIO SURGEONS STRL SZ 6.5 (GLOVE) ×1
GLOVE ECLIPSE 7.5 STRL STRAW (GLOVE) ×12 IMPLANT
GLOVE INDICATOR 7.5 STRL GRN (GLOVE) ×3 IMPLANT
GOWN STRL REUS W/ TWL LRG LVL3 (GOWN DISPOSABLE) ×8 IMPLANT
GOWN STRL REUS W/ TWL XL LVL3 (GOWN DISPOSABLE) ×12 IMPLANT
GOWN STRL REUS W/TWL LRG LVL3 (GOWN DISPOSABLE) ×24
GOWN STRL REUS W/TWL XL LVL3 (GOWN DISPOSABLE) ×48
GRASPER ENDOPATH ANVIL 10MM (MISCELLANEOUS) ×6 IMPLANT
GRASPER SUT TROCAR 14GX15 (MISCELLANEOUS) ×6 IMPLANT
HEMOSTAT SURGICEL 2X14 (HEMOSTASIS) ×3 IMPLANT
IRRIGATION STRYKERFLOW (MISCELLANEOUS) ×3 IMPLANT
IRRIGATOR STRYKERFLOW (MISCELLANEOUS)
IRRIGATOR SUCT 8 DISP DVNC XI (IRRIGATION / IRRIGATOR) IMPLANT
IRRIGATOR SUCTION 8MM XI DISP (IRRIGATION / IRRIGATOR) ×6
IV NS 1000ML (IV SOLUTION)
IV NS 1000ML BAXH (IV SOLUTION) IMPLANT
KIT BASIN OR (CUSTOM PROCEDURE TRAY) ×6 IMPLANT
KIT CLEAN ENDO COMPLIANCE (KITS) ×2 IMPLANT
KIT DILATOR VASC 18G NDL (KITS) ×6 IMPLANT
KIT TUBE JEJUNAL 16FR (CATHETERS) IMPLANT
KIT TURNOVER KIT B (KITS) ×6 IMPLANT
NDL 18GX1X1/2 (RX/OR ONLY) (NEEDLE) IMPLANT
NDL BIOPSY 14X6 SOFT TISS (NEEDLE) IMPLANT
NEEDLE 18GX1X1/2 (RX/OR ONLY) (NEEDLE) IMPLANT
NEEDLE 22X1 1/2 (OR ONLY) (NEEDLE) ×2 IMPLANT
NEEDLE BIOPSY 14X6 SOFT TISS (NEEDLE) IMPLANT
NS IRRIG 1000ML POUR BTL (IV SOLUTION) ×14 IMPLANT
OBTURATOR OPTICAL STANDARD 8MM (TROCAR) ×12
OBTURATOR OPTICAL STND 8 DVNC (TROCAR) ×8
OBTURATOR OPTICALSTD 8 DVNC (TROCAR) ×6 IMPLANT
PACK CHEST (CUSTOM PROCEDURE TRAY) ×6 IMPLANT
PAD ARMBOARD 7.5X6 YLW CONV (MISCELLANEOUS) ×12 IMPLANT
PORT ACCESS TROCAR AIRSEAL 12 (TROCAR) ×4 IMPLANT
PORT ACCESS TROCAR AIRSEAL 5M (TROCAR) ×2
RELOAD ENDO STITCH (ENDOMECHANICALS) ×48 IMPLANT
RELOAD STAPLE 45 2.5 WHT DVNC (STAPLE) IMPLANT
RELOAD STAPLE 45 3.5 BLU DVNC (STAPLE) IMPLANT
RELOAD STAPLE 45 4.3 GRN DVNC (STAPLE) IMPLANT
RELOAD STAPLER 2.5X45 WHT DVNC (STAPLE) ×8 IMPLANT
RELOAD STAPLER 3.5X45 BLU DVNC (STAPLE) ×44 IMPLANT
RELOAD STAPLER 4.3X45 GRN DVNC (STAPLE) ×36 IMPLANT
RELOAD SUT TRIPLE-STITCH 2-0 (ENDOMECHANICALS) ×32 IMPLANT
RETRACTOR WOUND ALXS 19CM XSML (INSTRUMENTS) ×5 IMPLANT
RTRCTR WOUND ALEXIS 19CM XSML (INSTRUMENTS) ×12
SCISSORS ENDO CVD 5DCS (MISCELLANEOUS) ×6 IMPLANT
SCISSORS LAP 5X35 DISP (ENDOMECHANICALS) ×6 IMPLANT
SEAL CANN UNIV 5-8 DVNC XI (MISCELLANEOUS) ×12 IMPLANT
SEAL XI 5MM-8MM UNIVERSAL (MISCELLANEOUS) ×24
SEALER SYNCHRO 8 IS4000 DV (MISCELLANEOUS) ×12
SEALER SYNCHRO 8 IS4000 DVNC (MISCELLANEOUS) IMPLANT
SET IRRIG TUBING LAPAROSCOPIC (IRRIGATION / IRRIGATOR) IMPLANT
SET TRI-LUMEN FLTR TB AIRSEAL (TUBING) ×6 IMPLANT
SET TUBE SMOKE EVAC HIGH FLOW (TUBING) ×6 IMPLANT
SHEET MEDIUM DRAPE 40X70 STRL (DRAPES) ×8 IMPLANT
SLEEVE ENDOPATH XCEL 5M (ENDOMECHANICALS) ×6 IMPLANT
STAPLER 45 DA VINCI SURE FORM (STAPLE) ×12
STAPLER 45 SUREFORM DVNC (STAPLE) IMPLANT
STAPLER 60 DA VINCI SURE FORM (STAPLE)
STAPLER 60 SUREFORM DVNC (STAPLE) IMPLANT
STAPLER CANNULA SEAL DVNC XI (STAPLE) ×4 IMPLANT
STAPLER CANNULA SEAL XI (STAPLE) ×12
STAPLER CIRC 25MM 4.8MM THK (STAPLE) ×2 IMPLANT
STAPLER RELOAD 2.5X45 WHITE (STAPLE) ×12
STAPLER RELOAD 2.5X45 WHT DVNC (STAPLE) ×8
STAPLER RELOAD 3.5X45 BLU DVNC (STAPLE) ×44
STAPLER RELOAD 3.5X45 BLUE (STAPLE) ×66
STAPLER RELOAD 4.3X45 GREEN (STAPLE) ×54
STAPLER RELOAD 4.3X45 GRN DVNC (STAPLE) ×36
STAPLER TRANS-ORAL 25MM EEA (STAPLE) ×3 IMPLANT
STOPCOCK 4 WAY LG BORE MALE ST (IV SETS) ×6 IMPLANT
SUT DVC VLOC 180 2-0 12IN GS21 (SUTURE) ×12
SUT ETHIBOND 0 36 GRN (SUTURE) ×6 IMPLANT
SUT MNCRL AB 4-0 PS2 18 (SUTURE) ×12 IMPLANT
SUT SILK  1 MH (SUTURE) ×18
SUT SILK 1 MH (SUTURE) ×4 IMPLANT
SUT SILK 2 0 SH (SUTURE) ×6 IMPLANT
SUT SILK 2 0SH CR/8 30 (SUTURE) ×2 IMPLANT
SUT SILK 3 0SH CR/8 30 (SUTURE) ×3 IMPLANT
SUT SURGIDAC NAB ES-9 0 48 120 (SUTURE) ×30 IMPLANT
SUT VIC AB 2-0 CT1 36 (SUTURE) ×6 IMPLANT
SUT VIC AB 2-0 SH 27 (SUTURE)
SUT VIC AB 2-0 SH 27XBRD (SUTURE) IMPLANT
SUT VIC AB 3-0 SH 27 (SUTURE) ×54
SUT VIC AB 3-0 SH 27X BRD (SUTURE) ×24 IMPLANT
SUT VIC AB 3-0 SH 8-18 (SUTURE) ×6 IMPLANT
SUT VIC AB 3-0 X1 27 (SUTURE) IMPLANT
SUT VICRYL 0 UR6 27IN ABS (SUTURE) ×12 IMPLANT
SUT VLOC 180 0 9IN  GS21 (SUTURE) ×6
SUT VLOC 180 0 9IN GS21 (SUTURE) ×4 IMPLANT
SUTURE DVC VL 180 2-0 12INGS21 (SUTURE) IMPLANT
SYR 10ML LL (SYRINGE) ×6 IMPLANT
SYR 20ML LL LF (SYRINGE) ×6 IMPLANT
SYR 50ML LL SCALE MARK (SYRINGE) ×6 IMPLANT
SYSTEM SAHARA CHEST DRAIN ATS (WOUND CARE) ×4 IMPLANT
TAPE CLOTH SURG 6X10 WHT LF (GAUZE/BANDAGES/DRESSINGS) ×3 IMPLANT
TOWEL GREEN STERILE (TOWEL DISPOSABLE) ×12 IMPLANT
TOWEL GREEN STERILE FF (TOWEL DISPOSABLE) ×6 IMPLANT
TRAY FOLEY MTR SLVR 16FR STAT (SET/KITS/TRAYS/PACK) ×6 IMPLANT
TROCAR XCEL 12X100 BLDLESS (ENDOMECHANICALS) ×6 IMPLANT
TROCAR XCEL BLADELESS 5X75MML (TROCAR) ×6 IMPLANT
TROCAR XCEL NON-BLD 11X100MML (ENDOMECHANICALS) ×2 IMPLANT
TROCAR XCEL NON-BLD 5MMX100MML (ENDOMECHANICALS) ×6 IMPLANT
TUBE CONNECTING 20'X1/4 (TUBING) ×1
TUBE CONNECTING 20X1/4 (TUBING) ×5 IMPLANT
TUBE J 18FR (TUBING) ×3 IMPLANT
TUBING ENDO SMARTCAP (MISCELLANEOUS) ×2 IMPLANT
TUBING EXTENTION W/L.L. (IV SETS) ×6 IMPLANT
TUBING LAP HI FLOW INSUFFLATIO (TUBING) ×3 IMPLANT
UNDERPAD 30X36 HEAVY ABSORB (UNDERPADS AND DIAPERS) ×6 IMPLANT
WATER STERILE IRR 1000ML POUR (IV SOLUTION) ×12 IMPLANT
WIRE EMERALD 3MM-J .035X150CM (WIRE) ×6 IMPLANT

## 2020-02-24 NOTE — Progress Notes (Signed)
Per Dr. Kipp Brood, okay for patient to go to [place] progressive post op.

## 2020-02-24 NOTE — Anesthesia Procedure Notes (Signed)
Procedure Name: Intubation Date/Time: 02/24/2020 7:37 AM Performed by: Rande Brunt, CRNA Pre-anesthesia Checklist: Patient identified, Emergency Drugs available, Suction available and Patient being monitored Patient Re-evaluated:Patient Re-evaluated prior to induction Oxygen Delivery Method: Circle System Utilized Preoxygenation: Pre-oxygenation with 100% oxygen Induction Type: IV induction Ventilation: Mask ventilation without difficulty Laryngoscope Size: Mac and 4 Grade View: Grade II Tube type: Oral Endobronchial tube: Double lumen EBT, EBT position confirmed by auscultation and Left and 39 Fr Number of attempts: 1 Airway Equipment and Method: Stylet Placement Confirmation: ETT inserted through vocal cords under direct vision,  positive ETCO2 and breath sounds checked- equal and bilateral Tube secured with: Tape Dental Injury: Teeth and Oropharynx as per pre-operative assessment

## 2020-02-24 NOTE — Transfer of Care (Signed)
Immediate Anesthesia Transfer of Care Note  Patient: Brett Dawson  Procedure(s) Performed: XI ROBOTIC ASSISTED IVOR LEWIS ESOPHAGECTOMY (N/A Chest) LAPARSCOPIC  JEJUNOSTOMY TUBE PLACEMENT (N/A Abdomen) ESOPHAGOGASTRODUODENOSCOPY (EGD) (N/A Mouth) INTERCOSTAL NERVE BLOCK (Right Chest)  Patient Location: PACU  Anesthesia Type:General  Level of Consciousness: awake, oriented, patient cooperative and responds to stimulation  Airway & Oxygen Therapy: Patient Spontanous Breathing and Patient connected to face mask oxygen  Post-op Assessment: Report given to RN, Post -op Vital signs reviewed and stable and Patient moving all extremities X 4  Post vital signs: Reviewed and stable  Last Vitals:  Vitals Value Taken Time  BP 91/63 02/24/20 1937  Temp    Pulse 67 02/24/20 1943  Resp 16 02/24/20 1943  SpO2 99 % 02/24/20 1943  Vitals shown include unvalidated device data.  Last Pain:  Vitals:   02/24/20 0647  TempSrc:   PainSc: 0-No pain         Complications: No complications documented.

## 2020-02-24 NOTE — H&P (Signed)
[address]       [place] [address]             [phone]       No events since clinic appointment No stress test, but low risk Vitals:   02/24/20 0622  BP: 131/70  Pulse: 66  Resp: 18  Temp: 98.1 F (36.7 C)  SpO2: 97%   Alert NAD Sinus EWOB abd soft  64 yo male with Esophageal cancer. OR today for Robotic assisted Ivor- Lewis esophagectomy  [facility] Record #[id number] Date of Birth: Feb 15, 1956  Referring: [name], MD Primary Care: [name], MD Primary Cardiologist: No primary care provider on file.  Chief Complaint:        Chief Complaint  Patient presents with  . Esophageal Cancer    post treatment PET 02/17/20    History of Present Illness:    Brett Dawson 64 y.o. male presents for his post therapy PET scan review. He completed his neoadjuvant chemotherapy and radiation on September 9.  His weight has remained stable throughout the course of his therapy.  When he was originally diagnosed he had lost approximately 60 pounds, but did not have much dysphagia or diet aphasia.  His original CT scan did show a very large distal esophageal lesion and biopsies were consistent with squamous cell cancer.  Smoking Hx: Non-smoker   Zubrod Score: At the time of surgery this patient's most appropriate activity status/level should be described as: [x] ?    0    Normal activity, no symptoms [] ?    1    Restricted in physical strenuous activity but ambulatory, able to do out light work [] ?    2    Ambulatory and capable of self care, unable to do work activities, up and about               >50 % of waking hours                              [] ?    3    Only limited self care, in bed greater than 50% of waking hours [] ?    4    Completely disabled, no self care, confined to bed or chair [] ?    5    Moribund       Past Medical History:  Diagnosis Date  . Anemia   . Anemia   . Malignant neoplasm of lower  third of esophagus ([facility]) 11/24/2019         Past Surgical History:  Procedure Laterality Date  . BIOPSY  11/23/2019   Procedure: BIOPSY;  Surgeon: Arta Silence, MD;  Location: [facility];  Service: Endoscopy;;  . ESOPHAGOGASTRODUODENOSCOPY (EGD) WITH PROPOFOL N/A 11/23/2019   Procedure: ESOPHAGOGASTRODUODENOSCOPY (EGD) WITH PROPOFOL;  Surgeon: Arta Silence, MD;  Location: [facility];  Service: Endoscopy;  Laterality: N/A;  . TONSILLECTOMY           Family History  Problem Relation Age of Onset  . Hyperlipidemia Mother      Social History      Tobacco Use  Smoking Status Never Smoker  Smokeless Tobacco Never Used    Social History       Substance and Sexual Activity  Alcohol Use Yes  . Alcohol/week: 40.0 standard drinks  . Types: 40 Standard drinks or equivalent per week     No Known Allergies  Current Outpatient Medications  Medication Sig Dispense Refill  . Colchicine 0.6 MG CAPS Take 1 capsule by mouth 2 (two) times daily as needed (gout).     . FEROSUL 325 (65 Fe) MG tablet Take 1 tablet (325 mg total) by mouth daily. 30 tablet 2  . ondansetron (ZOFRAN) 8 MG tablet Take 1 tablet (8 mg total) by mouth 2 (two) times daily as needed for refractory nausea / vomiting. Start on day 3 after chemo. 30 tablet 1  . pantoprazole (PROTONIX) 40 MG tablet Take 40 mg by mouth daily.    . polyethylene glycol (MIRALAX / GLYCOLAX) 17 g packet Take 17 g by mouth daily.    . prochlorperazine (COMPAZINE) 10 MG tablet TAKE 1 TABLET(10 MG) BY MOUTH EVERY 6 HOURS AS NEEDED FOR NAUSEA OR VOMITING 30 tablet 1  . sucralfate (CARAFATE) 1 g tablet Take 1 tablet (1 g total) by mouth 4 (four) times daily. Dissolve each tablet in 15 cc water before use. 120 tablet 2   No current facility-administered medications for this visit.    Review of Systems  Constitutional: Negative.   Respiratory: Negative.   Cardiovascular: Negative.   Gastrointestinal:  Positive for heartburn and nausea.     PHYSICAL EXAMINATION: BP 129/75 (BP Location: Right Arm, Patient Position: Sitting)   Pulse 70   Resp 18   Ht 5\' 11"  (1.803 m)   Wt 218 lb (98.9 kg)   SpO2 98% Comment: RA with mask on  BMI 30.40 kg/m  Physical Exam Constitutional:      General: He is not in acute distress.    Appearance: Normal appearance. He is normal weight. He is not ill-appearing.  Eyes:     Extraocular Movements: Extraocular movements intact.     Conjunctiva/sclera: Conjunctivae normal.  Pulmonary:     Effort: Pulmonary effort is normal. No respiratory distress.  Abdominal:     General: Abdomen is flat. There is no distension.     Palpations: Abdomen is soft.  Musculoskeletal:        General: Normal range of motion.     Cervical back: Normal range of motion.  Skin:    General: Skin is warm and dry.  Neurological:     Mental Status: He is alert.     Diagnostic Studies & Laboratory data:    CT Scan:CT from May 2021 showed what was thought to originally be large hiatal hernia. But on repeat imaging in July, this was noted to be more consistent with an esophageal mass. It does extend up behind the heart in the lower esophagus. Gastrohepatic ligament lymph nodes are also enlarged.  PET/CT:PET/CT from 11/22/2019 shows a 6 cm x 9 cm esophageal mass with an SUV uptake of 20.2. The gastric cardia is also hypermetabolic. Gastrohepatic ligament nodes are enlarged 1.1 cm in the SUV of 4.8.  PET/CT from 02/17/2020 shows good response and decrease SUV in the esophageal mass.  There are also some enlarged lymph nodes in the gastrohepatic ligament.  PET/CT fromEGD/EUS:Endoscopy was performed by Dr. [name] on 11/23/2019. An EUS was not performed  A large, fungating and ulcerating mass was found in the lower third of the esophagus, beginning 34 cmfrom the incisors and extending circumferentially and confluently to the GE junction (41 cm) and further into the  cardia.The mass was partially obstructing and circumferential; diagnostic endoscope could pass through tumor with mild resistance.     Path: A. STOMACH, CARDIA, BIOPSY:  - Invasive well-differentiated squamous cell carcinoma. See comment  B. ESOPHAGUS, DISTAL, BIOPSY:  - Invasive well-differentiated squamous cell carcinoma. See comme     I have independently reviewed the above radiology studies  and reviewed the findings with the patient.   Recent Lab Findings: Recent Labs       Lab Results  Component Value Date   WBC 3.0 (L) 01/26/2020   HGB 10.2 (L) 01/26/2020   HCT 31.7 (L) 01/26/2020   PLT 152 01/26/2020   GLUCOSE 99 01/26/2020   ALT 21 01/26/2020   AST 29 01/26/2020   NA 140 01/26/2020   K 4.2 01/26/2020   CL 106 01/26/2020   CREATININE 0.71 01/26/2020   BUN 10 01/26/2020   CO2 29 01/26/2020   INR 1.2 09/18/2019   HGBA1C 5.6 05/21/2017       PFTs: Pending   Assessment / Plan:   64 year old male with 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  He tolerated his neoadjuvant therapy and was able to maintain a weight of over 210 pounds.  At this visit he weighs 218 pounds so he has gained some weight since completion of his therapy.  I think that he would be a good surgical candidate for robotic assisted Ivor Lewis esophagectomy.  The tumor does appear smaller and I can see a definitive line between it and his posterior mediastinum.  He never had a formal EUS for pretherapy staging, but given the timing I think that it is safe to proceed with surgery.  He will need cardiac stress test and pulmonary function testing prior to surgery.  He is tentatively scheduled for October 22.  Risks and benefits have been discussed and is agreeable to proceed.  Of note he currently lives by himself thus I recommended that strongly consider post hospitalization inpatient rehab.  We will contact the social worker to  give him a list of facilities.

## 2020-02-24 NOTE — Anesthesia Procedure Notes (Addendum)
Arterial Line Insertion Start/End10/21/2021 7:00 AM, 02/24/2020 7:15 AM Performed by: Catalina Gravel, MD  Patient location: Pre-op. Preanesthetic checklist: patient identified, IV checked, site marked, risks and benefits discussed, surgical consent, monitors and equipment checked, pre-op evaluation, timeout performed and anesthesia consent Lidocaine 1% used for infiltration Left, radial was placed Catheter size: 20 G Hand hygiene performed  and maximum sterile barriers used  Allen's test indicative of satisfactory collateral circulation Attempts: 3 Procedure performed using ultrasound guided technique. Following insertion, dressing applied and Biopatch. Post procedure assessment: normal and unchanged  Patient tolerated the procedure well with no immediate complications.

## 2020-02-24 NOTE — Op Note (Signed)
[address]       [place] [address]             [phone]        02/24/2020  Patient:  Brett Dawson Pre-Op Dx: Distal esophageal adenocarcinoma Post-op Dx: Same Procedure: - Esophagoscopy - Robotic assisted laparoscopy - Robotic assisted thoracoscopy - Ivor-Lewis esophagectomy - Pyloroplasty - Laparoscopic jejunostomy tube placement 36F - Intercostal nerve block   Surgeon and Role:      * Venora Kautzman, Lucile Crater, MD - Primary    Evonnie Pat, PA-C- assisting  Anesthesia  general EBL: 250 ml Blood Administration: None Specimen: Esophagus, celiac node, level 4 node   Counts: correct   Indications:  64 year old male with 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 He tolerated his neoadjuvant therapy and was able to maintain a weight of over 210 pounds. At this visit he weighs 218 pounds so he has gained some weight since completion of his therapy. I think that he would be a good surgical candidate for robotic assisted Ivor Lewis esophagectomy. The tumor does appear smaller and I can see a definitive line between it and his posterior mediastinum. He never had a formal EUS for pretherapy staging, but given the timing I think that it is safe to proceed with surgery. He will need cardiac stress test and pulmonary function testing prior to surgery. He is tentatively scheduled for October 22. Risks and benefits have been discussed and is agreeable to proceed.  Findings: The esophagus was densely adherent in the hiatus.  This is a very large mass.  On laparoscopy it was evident that the lower lobe on the right and the left were both stuck to the mass.  I mobilize as much as possible from the abdomen and then during the thoracoscopic mobilization, a wedge resection of both lower lobes was performed and this was taken en bloc with the mass.  During creation of the esophagogastric anastomosis the doughnuts were quite  thin.  I then elected to perform an esophagoscopy to ensure a good seal.  There was a full anastomosis on esophagoscopy.  Operative Technique: After the risks, benefits and alternatives were thoroughly discussed, the patient was brought to the operative theatre.  Anesthesia was induced, the patient was then prepped and draped in normal sterile fashion.  An appropriate surgical pause was performed, and pre-operative antibiotics were dosed accordingly.  We began with a 1 cm incision 15 cm caudad from the xiphoid and slightly lateral to the umbilicus.  Using an Optiview we entered the peritoneal space.  The abdomen was then insufflated with CO2.  3 other robotic ports were placed to triangulate the hiatus.  Another 12 mm port was placed in place at the level of the umbilicus laterally for an assistant port and another 5 mm trocar was placed in the right lower quadrant for liver retractor.  The patient was then placed in steep reverse Trendelenburg and the liver was elevated to expose the esophageal hiatus.  And then the robot was docked.  We began by dividing the gastrohepatic ligament to expose the right diaphragmatic crus and then dissected the esophagus into the mediastinum.  We then divided the short gastrics and moved towards the right crus and completed our dissection along the esophageal hiatus.  A Penrose drain was then used to encircle the the esophagus and we continued our dissection up into the mediastinum.  The stomach was then retracted  superiorly, and we mobilized it off of the pancreas.  The left gastric artery was then isolated and divided with a robotic stabler.  ICG was then injected through his central line, and good blood flow to the stomach was evident.  We then marked an area away from the right gastroepiploic artery, and began to divide the omentum.    Once we achieved good mobilization, we focused our attention on the pylorus.  A 3cm longitudinal incision in the  made, and the mucosa  was exposed.  It was then closed in 2 layers in a standard Heineke-Mikulicz fashion.  We then began to tubularized the gastric conduit with several fires of the robotic stapler.  Once complete, the conduit was then attached to the distal end of the specimen.  We then undocked the robot, after removing all instruments, and the liver retractor, and then focused our attention placement of the jejunostomy tube.  The ligament of Treitz was identified, and a portion of small bowel about 20-30cm distal was used.  Corner stiches were placed, and passed out through the abdominal wall.  Using Seldinger technique, we access the small bowel and confirmed its position with insufflation.  The tract was sequentially dilated, and we passed a 8F jejunostomy tube distally.  The sutures were secured, and another proximal stitch was placed to prevent torsion.  The pneumoperitoneum was released, and all ports were removed.  The incisions were closed with absorbable suture.   The patient was then placed in a left lateral decubitus position.  The robotic ports were placed to triangulate the esophagus.  We continued our mobilization of the esophagus up to the azygous vein.  The azygous vein was divided with a robotic stapler.  The esophagus was then mobilized circumferentially, and then divided at the level of the azygous vein.  A 4cm access incision was made inferiorly in the posterior axillary line.  A wound retractor was placed, and we pulled the specimen up along with the gastric conduit.  Proximal and distal margins from the specimen were sent off.  An gastrotomy was then made in the conduit, and 13mm EEA stapler was inserted.  The anvil was passed on an NG tube, and it was passed through a small esophagotomy.  The two were connected, and the anastomosis was created.  Two complete rings were produced, and gastrotomy was closed with robotic stapler.  The suture line was buttressed with an omental pedical flap.  The ports were  removed, and a 16F chest tube was placed.  An intercostal nerve block was performed.  The lung was expanded.  The incisions were closed with absorbable suture.    I then performed an esophagoscopy to evaluate the anastomosis.  The esophagoscope was passed through the oropharynx down to the stomach.  I carefully traversed the anastomosis and visualize it.  It was intact without evidence of hole.  I then advanced the scope down through the pylorus into the duodenum without complication.  I then watched the NG tube past the anastomosis into the stomach.    The patient tolerated the procedure without any immediate complications, and was transferred to the PACU in stable condition.  Javohn Basey [name]

## 2020-02-24 NOTE — Brief Op Note (Signed)
02/24/2020  6:10 PM  PATIENT:  Brett Dawson  64 y.o. male  PRE-OPERATIVE DIAGNOSIS:  ESOPHAGEAL CANCER  POST-OPERATIVE DIAGNOSIS:  ESOPHAGEAL CANCER  PROCEDURE:  Procedure(s): XI ROBOTIC ASSISTED IVOR LEWIS ESOPHAGECTOMY (N/A) XI ROBOTIC ASSISTED JEJUNOSTOMY TUBE PLACEMENT (N/A)  SURGEON:  Surgeon(s) and Role:    * Lightfoot, Lucile Crater, MD - Primary  PHYSICIAN ASSISTANT: Yasamin Karel PA-C  ASSISTANTS: STAFF  ANESTHESIA:   general  EBL:  300 mL   BLOOD ADMINISTERED:none  DRAINS: 1 19 BLAKE AND ONE ARGYLE 28 F RIGHT PLEURAL TUBES   LOCAL MEDICATIONS USED:  OTHER EXPAREL  SPECIMEN:  Source of Specimen:  DISTAL ESOPHAGUS/PORTION OF STOMACH  DISPOSITION OF SPECIMEN:  PATHOLOGY  COUNTS:  YES  TOURNIQUET:  * No tourniquets in log *  DICTATION: .Dragon Dictation  PLAN OF CARE: Admit to inpatient   PATIENT DISPOSITION:  PACU - hemodynamically stable.   Delay start of Pharmacological VTE agent (>24hrs) due to surgical blood loss or risk of bleeding: yes  COMPLICATIONS: NO KNOWN

## 2020-02-24 NOTE — Progress Notes (Signed)
Patient stated that there is no one at the hospital with him and no one to call for update.  VSS, patient resting comfortably.  Will continue to monitor.

## 2020-02-24 NOTE — Anesthesia Procedure Notes (Signed)
Central Venous Catheter Insertion Performed by: Catalina Gravel, MD, anesthesiologist Start/End10/21/2021 6:55 AM, 02/24/2020 7:05 AM Patient location: Pre-op. Preanesthetic checklist: patient identified, IV checked, site marked, risks and benefits discussed, surgical consent, monitors and equipment checked, pre-op evaluation, timeout performed and anesthesia consent Position: Trendelenburg Lidocaine 1% used for infiltration and patient sedated Hand hygiene performed , maximum sterile barriers used  and Seldinger technique used Catheter size: 8 Fr Total catheter length 16. Central line was placed.Double lumen Procedure performed using ultrasound guided technique. Ultrasound Notes:anatomy identified, needle tip was noted to be adjacent to the nerve/plexus identified, no ultrasound evidence of intravascular and/or intraneural injection and image(s) printed for medical record Attempts: 1 Following insertion, line sutured, dressing applied and Biopatch. Post procedure assessment: blood return through all ports, free fluid flow and no air  Patient tolerated the procedure well with no immediate complications.

## 2020-02-24 NOTE — Anesthesia Postprocedure Evaluation (Signed)
Anesthesia Post Note  Patient: Brett Dawson  Procedure(s) Performed: XI ROBOTIC ASSISTED IVOR LEWIS ESOPHAGECTOMY (N/A Chest) LAPARSCOPIC  JEJUNOSTOMY TUBE PLACEMENT (N/A Abdomen) ESOPHAGOGASTRODUODENOSCOPY (EGD) (N/A Mouth) INTERCOSTAL NERVE BLOCK (Right Chest)     Patient location during evaluation: PACU Anesthesia Type: General Level of consciousness: awake and alert Pain management: pain level controlled Vital Signs Assessment: post-procedure vital signs reviewed and stable Respiratory status: spontaneous breathing, nonlabored ventilation and respiratory function stable Cardiovascular status: blood pressure returned to baseline and stable Postop Assessment: no apparent nausea or vomiting Anesthetic complications: no   No complications documented.  Last Vitals:  Vitals:   02/24/20 2015 02/24/20 2030  BP: (!) 92/54   Pulse: 75 66  Resp: 11 10  Temp: 36.9 C   SpO2: 99% 98%    Last Pain:  Vitals:   02/24/20 2015  TempSrc:   PainSc: Asleep                 Catalina Gravel

## 2020-02-25 ENCOUNTER — Other Ambulatory Visit: Payer: Self-pay | Admitting: *Deleted

## 2020-02-25 ENCOUNTER — Encounter (HOSPITAL_COMMUNITY): Payer: Self-pay | Admitting: Thoracic Surgery (Cardiothoracic Vascular Surgery)

## 2020-02-25 DIAGNOSIS — E43 Unspecified severe protein-calorie malnutrition: Secondary | ICD-10-CM

## 2020-02-25 DIAGNOSIS — E440 Moderate protein-calorie malnutrition: Secondary | ICD-10-CM | POA: Insufficient documentation

## 2020-02-25 DIAGNOSIS — E46 Unspecified protein-calorie malnutrition: Secondary | ICD-10-CM

## 2020-02-25 LAB — CBC
HCT: 28.8 % — ABNORMAL LOW (ref 39.0–52.0)
Hemoglobin: 9.3 g/dL — ABNORMAL LOW (ref 13.0–17.0)
MCH: 29.2 pg (ref 26.0–34.0)
MCHC: 32.3 g/dL (ref 30.0–36.0)
MCV: 90.6 fL (ref 80.0–100.0)
Platelets: 125 10*3/uL — ABNORMAL LOW (ref 150–400)
RBC: 3.18 MIL/uL — ABNORMAL LOW (ref 4.22–5.81)
RDW: 22.5 % — ABNORMAL HIGH (ref 11.5–15.5)
WBC: 8.3 10*3/uL (ref 4.0–10.5)
nRBC: 0 % (ref 0.0–0.2)

## 2020-02-25 LAB — POCT I-STAT 7, (LYTES, BLD GAS, ICA,H+H)
Acid-Base Excess: 3 mmol/L — ABNORMAL HIGH (ref 0.0–2.0)
Acid-Base Excess: 0 mmol/L (ref 0.0–2.0)
Acid-Base Excess: 1 mmol/L (ref 0.0–2.0)
Acid-Base Excess: 5 mmol/L — ABNORMAL HIGH (ref 0.0–2.0)
Acid-base deficit: 1 mmol/L (ref 0.0–2.0)
Bicarbonate: 29.3 mmol/L — ABNORMAL HIGH (ref 20.0–28.0)
Bicarbonate: 27 mmol/L (ref 20.0–28.0)
Bicarbonate: 26.2 mmol/L (ref 20.0–28.0)
Bicarbonate: 28.1 mmol/L — ABNORMAL HIGH (ref 20.0–28.0)
Bicarbonate: 29.6 mmol/L — ABNORMAL HIGH (ref 20.0–28.0)
Calcium, Ion: 1.22 mmol/L (ref 1.15–1.40)
Calcium, Ion: 1.22 mmol/L (ref 1.15–1.40)
Calcium, Ion: 1.19 mmol/L (ref 1.15–1.40)
Calcium, Ion: 1.18 mmol/L (ref 1.15–1.40)
Calcium, Ion: 1.06 mmol/L — ABNORMAL LOW (ref 1.15–1.40)
HCT: 31 % — ABNORMAL LOW (ref 39.0–52.0)
HCT: 32 % — ABNORMAL LOW (ref 39.0–52.0)
HCT: 32 % — ABNORMAL LOW (ref 39.0–52.0)
HCT: 32 % — ABNORMAL LOW (ref 39.0–52.0)
HCT: 28 % — ABNORMAL LOW (ref 39.0–52.0)
Hemoglobin: 10.5 g/dL — ABNORMAL LOW (ref 13.0–17.0)
Hemoglobin: 10.9 g/dL — ABNORMAL LOW (ref 13.0–17.0)
Hemoglobin: 10.9 g/dL — ABNORMAL LOW (ref 13.0–17.0)
Hemoglobin: 10.9 g/dL — ABNORMAL LOW (ref 13.0–17.0)
Hemoglobin: 9.5 g/dL — ABNORMAL LOW (ref 13.0–17.0)
O2 Saturation: 100 %
O2 Saturation: 98 %
O2 Saturation: 99 %
O2 Saturation: 86 %
O2 Saturation: 100 %
Patient temperature: 35.5
Patient temperature: 35.5
Patient temperature: 35.7
Patient temperature: 36
Patient temperature: 36.7
Potassium: 3.4 mmol/L — ABNORMAL LOW (ref 3.5–5.1)
Potassium: 4 mmol/L (ref 3.5–5.1)
Potassium: 4.2 mmol/L (ref 3.5–5.1)
Potassium: 4.7 mmol/L (ref 3.5–5.1)
Potassium: 3.8 mmol/L (ref 3.5–5.1)
Sodium: 142 mmol/L (ref 135–145)
Sodium: 141 mmol/L (ref 135–145)
Sodium: 141 mmol/L (ref 135–145)
Sodium: 141 mmol/L (ref 135–145)
Sodium: 141 mmol/L (ref 135–145)
TCO2: 31 mmol/L (ref 22–32)
TCO2: 29 mmol/L (ref 22–32)
TCO2: 28 mmol/L (ref 22–32)
TCO2: 30 mmol/L (ref 22–32)
TCO2: 31 mmol/L (ref 22–32)
pCO2 arterial: 50.8 mmHg — ABNORMAL HIGH (ref 32.0–48.0)
pCO2 arterial: 55.8 mmHg — ABNORMAL HIGH (ref 32.0–48.0)
pCO2 arterial: 46.4 mmHg (ref 32.0–48.0)
pCO2 arterial: 55.9 mmHg — ABNORMAL HIGH (ref 32.0–48.0)
pCO2 arterial: 42.5 mmHg (ref 32.0–48.0)
pH, Arterial: 7.362 (ref 7.350–7.450)
pH, Arterial: 7.285 — ABNORMAL LOW (ref 7.350–7.450)
pH, Arterial: 7.353 (ref 7.350–7.450)
pH, Arterial: 7.304 — ABNORMAL LOW (ref 7.350–7.450)
pH, Arterial: 7.449 (ref 7.350–7.450)
pO2, Arterial: 287 mmHg — ABNORMAL HIGH (ref 83.0–108.0)
pO2, Arterial: 105 mmHg (ref 83.0–108.0)
pO2, Arterial: 129 mmHg — ABNORMAL HIGH (ref 83.0–108.0)
pO2, Arterial: 54 mmHg — ABNORMAL LOW (ref 83.0–108.0)
pO2, Arterial: 461 mmHg — ABNORMAL HIGH (ref 83.0–108.0)

## 2020-02-25 LAB — BASIC METABOLIC PANEL
Anion gap: 9 (ref 5–15)
BUN: 16 mg/dL (ref 8–23)
CO2: 25 mmol/L (ref 22–32)
Calcium: 8.1 mg/dL — ABNORMAL LOW (ref 8.9–10.3)
Chloride: 106 mmol/L (ref 98–111)
Creatinine, Ser: 1.01 mg/dL (ref 0.61–1.24)
GFR, Estimated: >60 mL/min (ref >60)
Glucose, Bld: 132 mg/dL — ABNORMAL HIGH (ref 70–99)
Potassium: 3.7 mmol/L (ref 3.5–5.1)
Sodium: 140 mmol/L (ref 135–145)

## 2020-02-25 LAB — BLOOD GAS, ARTERIAL
Acid-Base Excess: 2.7 mmol/L — ABNORMAL HIGH (ref 0.0–2.0)
Bicarbonate: 26.5 mmol/L (ref 20.0–28.0)
FIO2: 28
O2 Saturation: 93.7 %
Patient temperature: 36.6
pCO2 arterial: 38.1 mmHg (ref 32.0–48.0)
pH, Arterial: 7.454 — ABNORMAL HIGH (ref 7.350–7.450)
pO2, Arterial: 66.8 mmHg — ABNORMAL LOW (ref 83.0–108.0)

## 2020-02-25 LAB — GLUCOSE, CAPILLARY
Glucose-Capillary: 136 mg/dL — ABNORMAL HIGH (ref 70–99)
Glucose-Capillary: 124 mg/dL — ABNORMAL HIGH (ref 70–99)
Glucose-Capillary: 122 mg/dL — ABNORMAL HIGH (ref 70–99)
Glucose-Capillary: 110 mg/dL — ABNORMAL HIGH (ref 70–99)
Glucose-Capillary: 107 mg/dL — ABNORMAL HIGH (ref 70–99)
Glucose-Capillary: 123 mg/dL — ABNORMAL HIGH (ref 70–99)

## 2020-02-25 MED ORDER — KETOROLAC TROMETHAMINE 15 MG/ML IJ SOLN
30.0000 mg | Freq: Four times a day (QID) | INTRAMUSCULAR | Status: AC
  Administered 2020-02-25 – 2020-03-01 (×19): 30 mg via INTRAVENOUS
  Filled 2020-02-25 (×19): qty 2

## 2020-02-25 MED ORDER — POTASSIUM CHLORIDE 10 MEQ/50ML IV SOLN
10.0000 meq | INTRAVENOUS | Status: AC
  Administered 2020-02-25 (×4): 10 meq via INTRAVENOUS
  Filled 2020-02-25 (×4): qty 50

## 2020-02-25 NOTE — Progress Notes (Signed)
Initial Nutrition Assessment  DOCUMENTATION CODES:   Non-severe (moderate) malnutrition in context of chronic illness  INTERVENTION:   Tube feeding recommendations: - Start Osmolite 1.5 @ 25 ml/hr via J-tube and increase rate by 10 ml q 8 hours to goal rate of 65 ml/hr (1560 ml/day) - ProSource TF 45 ml BID  Recommended tube feeding regimen at goal provides 2420 kcal, 120 grams of protein, and 1189 ml of H2O.   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  GOAL:   Patient will meet greater than or equal to 90% of their needs  MONITOR:   Diet advancement, Labs, Weight trends, Skin, I & O's  REASON FOR ASSESSMENT:   Diagnosis    ASSESSMENT:   64 year old male who presented on 10/21 for Ivor Lewis esophagectomy and J-tube placement. 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   Pt with NG tube in place, currently to low intermittent suction. NG tube with 30 ml output since surgery. J-tube is open to gravity drainage.  Spoke with pt at bedside. Pt asking, "when are you going to feed me?" Discussed plan for initiation of tube feeds once cleared by CTS.  Pt reports that his UBW prior to cancer diagnosis was between 260-270 lbs. He states that initially he lost weight down to 208 lbs but was able to gain weight back up to 218 lbs prior to surgery. He states that he did this by eating 2 large meals daily and drinking 3 Boost supplements daily. A typical meal includes stew or a burger.  Reviewed available weight history in chart. Weight on 09/14/19 was 101.6 kg. Pt lost weight down to lowest weight of 91.4 kg on 12/13/19. He has since slowly gained weight back up to  current weight of 99.6 kg.  Pt meets criteria for moderate malnutrition based on NFPE findings.  Medications reviewed and include: SSI q 4 hours, IV protonix, IV KCl 10 mEq x 4 runs IVF: D5 in NS @ 100 ml/hr  Labs reviewed: hemoglobin 9.3 CBG's: 122-155 x 24 hours  UOP: 2210 ml x 24 hours RUQ JP drain: 73 ml x 24 hours CT: 258 ml x 24 hours I/O's: +1.5 L since admit  NUTRITION - FOCUSED PHYSICAL EXAM:    Most Recent Value  Orbital Region Moderate depletion  Upper Arm Region Moderate depletion  Thoracic and Lumbar Region Moderate depletion  Buccal Region Moderate depletion  Temple Region Moderate depletion  Clavicle Bone Region Moderate depletion  Clavicle and Acromion Bone Region Moderate depletion  Scapular Bone Region Mild depletion  Dorsal Hand Mild depletion  Patellar Region Mild depletion  Anterior Thigh Region Moderate depletion  Posterior Calf Region Mild depletion  Edema (RD Assessment) None  Hair Reviewed  Eyes Reviewed  Mouth Reviewed  Skin Reviewed  Nails Reviewed       Diet Order:   Diet Order            Diet NPO time specified  Diet effective now                 EDUCATION NEEDS:   Education needs have been addressed  Skin:  Skin Assessment: Skin Integrity Issues: Incisions: chest, adomen  Last BM:  no documented BM  Height:  Ht Readings from Last 1 Encounters:  02/24/20 5\' 11"  (1.803 m)    Weight:   Wt Readings from Last 1 Encounters:  02/24/20 99.6 kg    Ideal Body Weight:  78.2 kg  BMI:  Body mass index is 30.61 kg/m.  Estimated Nutritional Needs:   Kcal:  0017-4944  Protein:  110-130 grams  Fluid:  >/= 2.0 L    Gaynell Face, MS, RD, LDN Inpatient Clinical Dietitian Please see [facility] for contact information.

## 2020-02-25 NOTE — Progress Notes (Addendum)
      [address]       ,[place] [address]             [phone]       1 Day Post-Op Procedure(s) (LRB): XI ROBOTIC ASSISTED IVOR LEWIS ESOPHAGECTOMY (N/A) LAPARSCOPIC  JEJUNOSTOMY TUBE PLACEMENT (N/A) ESOPHAGOGASTRODUODENOSCOPY (EGD) (N/A) INTERCOSTAL NERVE BLOCK (Right)  Subjective: Patient has some incisional tenderness. He denies nausea and states pain fairly well controlled  Objective: Vital signs in last 24 hours: Temp:  [98 F (36.7 C)-99.8 F (37.7 C)] 99.6 F (37.6 C) (10/22 0808) Pulse Rate:  [66-77] 67 (10/22 0808) Cardiac Rhythm: Normal sinus rhythm (10/22 0446) Resp:  [10-17] 15 (10/22 0400) BP: (88-99)/(52-63) 98/59 (10/22 0808) SpO2:  [90 %-100 %] 90 % (10/22 0400) Arterial Line BP: (83-108)/(39-47) 83/39 (10/22 0616)     Intake/Output from previous day: 10/21 0701 - 10/22 0700 In: 4480 [I.V.:2600; NG/GT:30; IV Piggyback:1850] Out: 2841 [Urine:2210; Drains:73; Blood:300; Chest Tube:258]   Physical Exam:  Cardiovascular: RRR Pulmonary: Clear to auscultation bilaterally Abdomen: Soft, incisional tenderness, bowel sounds present. Extremities: SCDs in place Wounds: Dressings are clean and dry.  No erythema or signs of infection. Chest Tube:to suction  Lab Results: UQJ:[id number] Labs    02/24/20 1945 02/25/20 0515  WBC 10.2 8.3  HGB 10.0* 9.3*  HCT 31.5* 28.8*  PLT 142* 125*   BMET:  Recent Labs    02/24/20 1945 02/25/20 0515  NA 140 140  K 3.9 3.7  CL 106 106  CO2 23 25  GLUCOSE 160* 132*  BUN 12 16  CREATININE 1.11 1.01  CALCIUM 8.9 8.1*    PT/INR:  Recent Labs    02/22/20 0830  LABPROT 13.2  INR 1.0   ABG:  INR: Will add last result for INR, ABG once components are confirmed Will add last 4 CBG results once components are confirmed  Assessment/Plan:  1. CV - SR with HR in the 60-70's. 2.  Pulmonary - On 2 liters via [place]. Chest tube with 258 cc since surgery. ABG results noted. Will order CXR for  am. 3. GI-NPO. NGT with 30 cc since surgery. Tube feedings likely to be started soon. 4. CBGs-155/136/124. Continue as will be started on tube feedings. 5. Supplement potassium 6. Anemia-H and H this am 9.3 and 28.8 7. Mild thrombocytopenia-platelets this am 125,000  Sharalyn Ink ZimmermanPA-C 02/25/2020,8:25 AM [phone]  Agree with above Starting tube feeds today Will remove a line Decrease IV fluids to 50.  D/c once tubefeeds start pulm toilet Swallow on [place]

## 2020-02-25 NOTE — Patient Outreach (Signed)
[facility]) Care Management  02/25/2020  HOWELL GROESBECK May 06, 1956 [id number]   [place] coordination- note hospitalization  Pt noted with scheduled admission for robotic assisted Ivor-Lewis esophagectomy with jejunostomy tube placement by Dr Kipp Brood and noted with discharge planned at the time of this note  Plan [facility] RN CM will reschedule and outreach to patient within 1-3 business days after discharge to follow up with him   Joelene Millin L. Lavina Hamman, RN, BSN, [place] Coordinator Office number [phone] Mobile number [phone]  Main [facility] number [phone] Fax number [phone]

## 2020-02-25 NOTE — Discharge Summary (Addendum)
Physician Discharge Summary  Patient ID: Brett Dawson MRN: [id number] DOB/AGE: 12-22-1955 64 y.o.  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 His weight has remained stable throughout the course of his therapy. When he was originally diagnosed he had lost approximately 60 pounds, but did not have much dysphagia or diet aphasia. His original CT scan did show a very large distal esophageal lesion and biopsies were consistent with squamous cell cancer. Smoking Hx:Non-smoker The patient and his studies were evaluated by Dr. Kipp Brood and he was felt to be a candidate to proceed with robotic assisted esophagectomy and was admitted this hospitalization electively for the procedure.   Hospital Course:   Brett Dawson was admitted for same-day surgery on 02/24/2020.  He was prepared and taken to the operating room where robotic assisted Iver Lewis esophagectomy and placement of a feeding jejunostomy were carried out.  See the dictated operative note for details.  Following the procedure, patient was transferred to the ICU with an NG tube in place along with a large bore right pleural tube and a 19 [place] Blake pleural tube on the right as well.  Patient was kept n.p.o. with an NG tube in place.  He remained stable after few days was transferred to [facility] progressive care.  Nutritional support was initiated with tube feeding as recommended by dietitian.  He easily reached a goal rate of 65 mL/h and tolerating this well.  On the fifth postoperative day,  an esophagram was performed that showed appropriate transit of contrast through the anastomosis with no evidence of leak.  The NG tube was removed at this point.  He was evaluated by the speech-language pathologist both at the bedside and with a modified barium swallow.  He was felt that have appropriate swallowing function.  A clear liquid diet was initiated and he tolerated this well.  He developed a fever on postop day 7.  He was noted on that day to have some early cellulitis of the skin surrounding the jejunostomy exit site.  Local wound care was initiated.  He was started on Ancef empirically.  He continued to have some fever off and on over the next few days.  The IV antibiotic spectrum was broadened.  Chest x-ray obtained on 1031 showed an enlarging left pleural effusion.  Requesting thoracentesis by interventional radiology.  On 03/05/2020, thoracentesis yielded 1 L of fluid from the left chest.  Fluid cultures were obtained.  Follow-up chest x-ray showed persistent left pleural effusion.  Brett Dawson also had developed worsening leukocytosis with WBC of 28,600 on 03/06/2020.  For these reasons, a left pleural pigtail catheter was placed at the bedside on 03/06/2020 by Dr. Kipp Brood.  An esophagram was also obtained on the same date.  This demonstrated a small contained collection of contrast along the anastomotic site felt to possibly represent an ulceration or potentially a small contained perforation.  There was no frank leak of contrast noted.  The patient was again made NPO.  Nutritional support and broad-spectrum IV antibiotic coverage were continued.  The CXR continued to show a moderate left pleural effusion. The interventional radiology service was consulted on 03/07/20 for  placement of an image-guided left pleural tube to drain the effusion. About 1L of fluid was again drained. IV antibiotics were continued.  The leukocytosis gradually resolved. He was allowed to resume a clear liquid diet on 03/09/20.  The right pleural tube was observed to have minimal drainage for several days so it was removed on 03/10/20.  Cultures from pleural fluid grew Proteus Mirabilis.  His Vancomycin was discontinued and he was kept on Unasyn until completing a full course.  The patient developed infection/yeast skin infection around J tube site.  This was treated with Nystatin ointment and Silvadene.  The right chest tube drainage, which was initially serous, became cloudy /opaque prompting concern for a chylous effusion.  The left pleural fluid sample was sent to the lab which confirmed elevated triglyceride level.  We switched to a low-fat tube feeding solution.  Interventional radiology team was consulted and on 03/23/2020, a lymph angiogram was performed.  Cannulation and embolization of the thoracic duct was attempted but was not successful.  A PICC line was placed and the patient was converted to TPN.  Despite these changes, the chylous left pleural effusion persisted.  Dr. Kipp Brood conferred with the interventional radiology team and decision was made to continue nutritional support with TPN for at least 1 week prior to reattempting percutaneous embolization of the thoracic duct.   He remained stable over the next 2 weeks while being supported nutritionally with TPN.  ON 04/07/20, he was returned to the interventional radiology suite where repeat lymphangiogram and successful thoracic duct embolization were carried out.  His chest tube output remained high.  CT of the chest was obtained and showed a substantial amount of fluid and debris that appeared to communicate with the gastric pull through concerning for anastomotic leak.  It was not felt to be related to thoracic duct leakage.  He was started on high fat tube feeds on 04/11/2020.  The patient was taken back to the operating room on 04/11/2020.  He underwent Esophagogastroscopy, placement of a 155 mm covered esophageal stent, esophogram with Omnipaque, and left thoracoscopy  with a 28 Fr [place] placement.  The patient tolerated the procedure and was taken back to the progressive care unit in stable condition.  The patient was kept NPO for 1 week.  He was restarted on tube feedings which will continue at discharge.  These will be decreased and cycled as his intake increases.  His chest tube output decreased significantly and was no longer purulent.  His pigtail catheter was removed on 04/18/2020.  He was started on a clear liquid diet.  His chest tube was converted to JP bulb on 04/21/2020.  The patient was evaluated by PT/OT who recommended additional in home rehab.  This along with home health nursing has been arranged.  The patient has received teaching on his tube feedings and Mini Express.  His surgical incisions are healing without infection.  His previous surgical incisions remain well healed.  He is medically stable for discharge home today.   Consults: Interventional Radiology, ENT  Significant Diagnostic Studies:   EXAM: CT CHEST WITH CONTRAST  IMPRESSION: 1. There is a large mass of the lower third of the esophagus, with ill-defined margins, measuring approximately 6.0 x 5.4 x 7.7 cm. This appears enlarged and more appreciably masslike than appearance on prior CT dated 09/14/2019. 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 2. There are prominent gastrohepatic ligament lymph nodes adjacent to the lesser curvature, concerning for nodal metastatic disease although not  ideally imaged on this examination of the chest. 3. No evidence of metastatic disease within in the chest. 4. Coronary artery disease.   Electronically Signed   By: Eddie Candle M.D.   On: 11/10/2019 13:48  CT Chest 12/6  1. Status post gastric pull-through esophagectomy and thoracic duct coiling. 2. There has been interval removal of an anteriorly positioned percutaneous pigtail drainage catheter and placement of a posteriorly positioned pigtail drainage  catheter in the posterior left hemithorax. This catheter lies within a frothy collection in the left pleural space, which is somewhat decreased in size compared to prior examination, although contains a substantial volume of fluid and debris and appears to communicate with the adjacent mediastinum about the gastric pull-through. This appearance is generally suspicious for anastomotic leak. Thoracic duct leak is not likely to explain this volume fluid. 3. There is dense consolidation of the dependent left lower lobe as well as of the right lung base, which may reflect atelectasis and/or airspace disease. 4. A previously seen right-sided chest tube has been removed with a small persistent right pleural effusion, not significantly changed. 5. Small volume perihepatic and perisplenic ascites. 6. Low-attenuation lesion of the anterior spleen, not significantly changed compared to prior examination and incompletely characterized. Attention on follow-up.   ESOPHOGRAM/BARIUM SWALLOW  FINDINGS:    10/26 The patient is able to stand for the study and swallow adequate amounts of contrast. Postop distal esophagectomy with gastric pull-through. No leak identified. No obstruction. Contrast flows through the pylorus without obstruction. There is an NG tube across the anastomosis with the tip in the body the stomach.  IMPRESSION: Postop distal esophagectomy with gastric pull-through. No leak or obstruction identified. Normal emptying of the stomach into the duodenum.   Electronically Signed   By: Franchot Gallo M.D.   On: 02/29/2020 10:00   ESOPHOGRAM/BARIUM SWALLOW  FINDINGS: 11/1  A right chest tube is in place. Gastric pull-through noted with mild irregularity of the lumen along the gastroesophageal staple line for example on series 19. While a free leak is not identified, there is a small outpouching along the anastomotic staple line for example on series 28 posteriorly. This  is also shown on series 37.  The stomach is nearly entirely in the chest, with the duodenal bulb just below the diaphragm. Contrast slowly extends into the duodenal bulb and descending duodenum.  IMPRESSION: 1. Along the anastomotic site there is a small contained collection of contrast which could be from an ulceration or potentially a small contained perforation. However, I do not observe a frank leak of contrast or free spread of contrast from this region.   Electronically Signed   By: Van Clines M.D.   On: 03/06/2020 11:10  PATHOLOGY:   SURGICAL PATHOLOGY Surgical pathology Collected: 02/24/20 1048  Lab status: Final-Edited  Resulting lab: [facility]  Value: SURGICAL PATHOLOGY  CASE: [id number]  PATIENT: Mikaeel Dawson  Surgical Pathology Report      Clinical History: None provided      FINAL MICROSCOPIC DIAGNOSIS:   A. LYMPH NODE, CELIAC, EXCISION:  - Focal atypia.  - See comment.   B. LYMPH NODE, LEVEL 4, EXCISION:  - One benign lymph node (0/1).   C. ESOP HAGUS, ESOPHAGECTOMY:  - Ulcer with inflammation, fibrosis and dystrophic calcifications.  - Focal residual keratin with giant cell reaction.  - No residual viable carcinoma identified.  - Margins free of tumor.  - Thirteen benign lymph nodes (0/13).  - See oncology table.  D. ADDITIONAL DISTAL MARGIN, EXCISION:  - Benign stomach.  - No evidence of malignancy.   COMMENT:  A. The celiac lymph node specimen consists of blood and connective  tissue with no lymph node tissue identified. There are several minute  fragments of epithelioid cells with mild atypia which are not sufficient  for diagnosis of malignancy.   ONCOLOGY TABLE:  ESOPHAGUS:  Procedure: Esophagogastrectomy.  Tumor Site: Gastroesophageal junction. See previous GE junction biopsy  report ([id number]).  Relationship of Tumor to Esophagogastric Junction: Ulcer is situated at  the gastroesophageal  junction.  Tumor Size: No residual viable tumor in resection specimen. See  comment.  Histologic Type: Squamous cell carcinoma, see [id number].  Histologic Grade: [id number].  Tumor Extension: Fibrosis and calcifications limited to wall of  esophagus. No residual viable carcinoma.  Margins: Not involved by tumor.  Treatment Effect: Presurgical neoadjuvant therapy.  Lymphovascular Invasion: Not identified.  Regional Lymph Nodes:    Number of Lymph Nodes Involved: 0    Number of Lymph Nodes Examined: 14  Pathologic Stage Classification (pTNM, AJCC 8th Edition): ypT0, ypN0  Ancillary Studies: No residual viable tumor.  Representative Tumor Block: No residual viable tumor.  Comment(s): The indurated ulcer at the gastroesophageal junction shows  inflammation with extensive fibrosis and dystrophic calcifications.  There is focal, residual keratin in the submucosa with associated giant  cell reaction. No residual viable carcinoma cells are identified.  (v4.1.0.0)   INTRAOPERATIVE DIAGNOSIS:  C1. ESOPHAGUS, PROXIMAL (ESOPHAGEAL) MARGIN, FROZEN SECTION:     No tumor identified.     Rapid intraoperative consult diagnosis rendered by Dr. Saralyn Pilar @  [phone] 02/24/2020.    C2. ESOPHAGUS, DISTAL (GASTRIC) MARGIN, FROZEN SECTION:     No tumor identified.     Rapid intraoperative consult diagnosis rendered by Dr. Saralyn Pilar @  [phone] 02/24/2020.   GROSS DESCRIPTION:  Specimen A: Received fresh is a 1.3 x 0.3 x 0.25 cm dark red-brown  indurated tissue with roughened surfaces. In toto 1 block.   Specimen B: Received fresh is a 0.8 x 0.7 x 0.3 cm gray-white to  yellow-red soft to rubbery nodular tissue, in toto 1 block.   Specimen C: Received fresh for rapid intraoperative consult evaluation  of proximal and distal margins by frozen section is the product of an  esophagectomy, consisting of 11 cm of distal esophagus and 10 cm of  proximal stomach. There is a double suture  at proximal margin and  single suture at distal margin. The external surface of esophagus has  scattered adhesions and several stapled areas, and is inked black at  gross. The stomach has a small amount of attached soft fatty tissue.  On opening, there is a 5 cm in length and 2.6 cm in width area of  indurated, nodular and depressed mucosa, presumed to be area of mass  treated with neoadjuvant therapy. This area is at the esophagogastric  junction with three cm of this area involving distal esophagus, 8 cm  from the proximal margin, and 2 cm of this area involving proximal  stomach, 8 cm from the distal margin. On sectioning through this area,  the tissue within the wall is pink-white, indurated, however no residual  grossly viable tumor is identified. Indurated tissue within the wall is  grossly widely free of the inked external surface. Remaining uninvolved  esophageal mucosa is pink-white, smooth, and uninvolved gastric mucosa  is tan-pink to hyperemic, smooth, soft with normal gastric folds. Found  within the attached fat are 13 possible  lymph nodes which range from 0.5  to 1.4 cm.   Block summary:  Block 1 = shave of proximal (esophageal) margin submitted for frozen  section  Block 2 = distal margin nearest indurated area of presumed treated mass  Blocks 3-9 = full-thickness sections of indurated area of presumed  treated mass, esophageal portion  Blocks 10, 11 = full-thickness sections of indurated area of presumed  treated mass, gastric portion  Block 12 = 5 possible nodes  Block 13 = 4 possible nodes  Block 14 = 1 possible node, bisected  Block 15 = 1 possible node, bisected  Block 16 = 1 possible node, bisected  Block 17 = 1 possible node, bisected   Specimen D: Received fresh is a 1.4 x 1.2 x 1 cm portion of tissue  clinically identified as additional proximal margin of esophagus, with 1  staple and. On opening, there is pink-red smooth, soft mucosa. A   full-thickness central section is submitted in 1 block.  SW 02/26/2020   Final Diagnosis performed by Claudette Laws, MD.  Electronically signed  02/28/2020  Technical and / or Professional components performed at [facility]. [facility], [place] [address] [place], [place] [address].  Immunohistochemistry Technical component (if applicable) was performed  at [facility]. [address], [place],  [place],  [address].  IMMUNOHISTOCHEMISTRY DISCLAIMER (if applicable):  Some of these immunohistochemical stains may have been developed and the  performance characteristics determine by [facility]. Some  may not have been cleared or approved by the U.S. Food and Drug  Administration. The FDA has determined that such clearance or approval  is not necessary. This test is used for clinical purposes. It should not  be regarded as investigational or for research. This laboratory is  certified under the [place]  (CLIA-88) as qualified to perform high complexity clinical laboratory  testing. The controls stained appropriately.     Treatments:   Surgery:  02/24/2020  Patient:  [name] Pre-Op Dx: Distal esophageal adenocarcinoma Post-op Dx: Same Procedure: - Esophagoscopy - Robotic assisted laparoscopy - Robotic assisted thoracoscopy - Ivor-Lewis esophagectomy - Pyloroplasty - Laparoscopic jejunostomy tube placement 110F - Intercostal nerve block  Surgeon and Role:      * Lightfoot, Lucile Crater, MD - Primary    Evonnie Pat, PA-C- assisting    Pre-procedure Diagnosis: Recurrent chylous effusion post esophagectomy Post-procedure Diagnosis: Same  Post technically successful lymphangiogram and thoracic duct fenestration without successful cannulation of the thoracic duct.    Complications: None Immediate EBL: None  PLAN: - Maintain low fat diet. - Continue to monitor pt's pleural out  put. - If chylous effusion persists for great than 1 week, could consider repeat attempt at lymphangiogram and T-duct embolization as well as attempted cannulation via the left subclavian vein.   Signed: Sandi Mariscal Pager: [phone] 03/23/2020, 11:58 AM    04/07/20 Lymphangiogram with Thoracic Duct Embolization  INDICATION: 64 year old male with persistent high volume left-sided chylous pleural effusion following esophagectomy. He underwent a first attempt at lymphangiography and thoracic duct embolization on 03/23/2020. Lymphangiography and fenestration of the thoracic duct were successful. However, the thoracic duct was not able to be accessed and embolized at that time.  Subsequently, high volume chylous effusion output has persisted. Therefore, patient presents today for a second attempt.  Dr. Corrie Mckusick assisted throughout the procedure.  EXAM: IR EMBO ART VEN HEMORR LYMPH EXTRAV INC GUIDE ROADMAPPING; LYMPHANGIOGRAM; IR ULTRASOUND GUIDANCE  VASC ACCESS LEFT  PROCEDURE: Informed consent was obtained from the patient following explanation of the procedure, risks, benefits and alternatives. The patient understands, agrees and consents for the procedure. All questions were addressed. A time out was performed prior to the initiation of the procedure. Maximal barrier sterile technique utilized including caps, mask, sterile gowns, sterile gloves, large sterile drape, hand hygiene, and Betadine prep.  Ultrasound was used to interrogate the left groin. A suitable left inguinal lymph node was identified. Under real-time ultrasound guidance, the hilum of the lymph node was punctured with a 25 gauge spinal needle. Lymphangiography was then attempted. Unfortunately, the lymph node is extremely sclerotic and no Lipiodol could be advanced into the lymphatic system. There was extravasation of Lipiodol from the lymph node itself.  Ultrasound was then used to interrogate  the right groin. A suitable right inguinal lymph node was identified. Under real-time ultrasound guidance, the hilum of the lymph node was punctured with a 25 gauge spinal needle. Lymphangiography was again attempted. Similar to the contralateral side, the sclerotic nature of the lymph node prevented passage of injected contrast material into the lymphatic channels. The lymph node parenchyma opacified and then there was extravasation.  At this point, the pelvis was evaluated under fluoroscopy. Several lymph nodes remains stained with Lipiodol from the last lymphangiogram in the external iliac chains bilaterally. A suitable node in the left external iliac chain was selected. Using fluoroscopic guidance, the lymph node was direct punctured with the 25 gauge spinal needle. Lymph angiography was performed. There is excellent filling of a prominent lymphatic channel extending superiorly along the left iliac chain. Lymphangiography was continued in till the thoracic duct could be identified at the T12-L1 level.  At this point, the image intensifier was angulated caudally and the thoracic duct was direct punctured with a 22 x 15 cm Chiba needle. A 0.014 Asahi Chikai wire was then advanced into the thoracic duct up to the level of the thoracic inlet. The Chiba needle was then removed. A small skin nick was made at the wire insertion site. A 2.4 French Progreat microcatheter was then advanced over the wire and into the thoracic duct. The wire was removed. Lymphangiography was then performed. Excellent opacification of the thoracic duct. There are multiple small branches arising from the duct along its intrathoracic course. There is also filling of a small channel versus the possible site of leak in the inferior aspect of the left hemithorax, likely at the base of the pleural space.  The microcatheter and wire combination were successfully navigated up to the thoracic inlet near where the  duct enters the subclavian vein. Coil embolization was then performed using a series of 6, 8 and 10 mm interlock detachable fibered microcoils as well as several packing Ruby low-profile microcoils. Once stasis was successfully accomplished, true fill embolic glue was prepared in the standard fashion using a 1-1 glue to Lipiodol ratio. The catheter was flushed with D5W, and glue embolization was performed along the entire length of the thoracic duct to the catheter entry site. The catheter was then removed.  Final spot images were obtained demonstrating successful combined coil and liquid embolization of the thoracic duct.  IMPRESSION: Successful bilateral lymphangiogram and thoracic duct embolization.   Electronically Signed   By: Jacqulynn Cadet M.D.   On: 04/07/2020 13:01   Discharge Exam: Blood pressure 95/72, pulse 88, temperature 98.6 F (37 C), temperature source Oral, resp. rate (!) 22, height 5\' 11"  (1.803 m), weight 85.3 kg, SpO2  94 %.  General appearance: alert, cooperative and no distress Heart: regular rate and rhythm Lungs: clear to auscultation bilaterally Abdomen: soft, non-tender; bowel sounds normal; no masses,  no organomegaly Extremities: extremities normal, atraumatic, no cyanosis or edema Wound: clean and dry   Discharge disposition: 01-Home or Self Care        Allergies as of 04/21/2020      Reactions   Bee Venom    Strawberry Extract       Medication List    STOP taking these medications   Colchicine 0.6 MG Caps   FeroSul 325 (65 FE) MG tablet Generic drug: ferrous sulfate   ondansetron 8 MG tablet Commonly known as: Zofran   prochlorperazine 10 MG tablet Commonly known as: COMPAZINE   sucralfate 1 g tablet Commonly known as: Carafate     TAKE these medications   acetaminophen 160 MG/5ML solution Commonly known as: TYLENOL Place 20.3 mLs (650 mg total) into feeding tube every 4 (four) hours as needed for fever (fever  >100.5).   feeding supplement (OSMOLITE 1.5 CAL) Liqd Place 1,760 mLs into feeding tube daily.   feeding supplement (PROSource TF) liquid Place 45 mLs into feeding tube 3 (three) times daily.   fluticasone 50 MCG/ACT nasal spray Commonly known as: FLONASE Place 2 sprays into both nostrils daily.   free water Soln Place 75 mLs into feeding tube every 4 (four) hours.   guaiFENesin 100 MG/5ML Soln Commonly known as: ROBITUSSIN Place 15 mLs (300 mg total) into feeding tube every 4 (four) hours as needed for cough or to loosen phlegm.   omeprazole 20 MG capsule Commonly known as: PRILOSEC Take 40 mg (2 capsules) twice daily per tube            Durable Medical Equipment  (From admission, onward)         Start     Ordered   04/18/20 0923  For home use only DME Tube feeding  Once       Comments: Transition to nocturnal tube feeds via J-tube: - Osmolite 1.5 @ 110 ml/hr x 16 hours from 1800 to 1000 (total of 1760 ml) - Free water flushes of 75 ml q 4 hours   04/18/20 0923   04/12/20 0854  For home use only DME Tube feeding pump  Once       Question:  Length of Need  Answer:  6 Months   04/12/20 0853   04/11/20 0721  For home use only DME 3 n 1  Once        04/11/20 0721   03/03/20 0810  For home use only DME Tube feeding pump  Once       Question:  Length of Need  Answer:  Lifetime   03/03/20 0809          Follow-up Information    Lajuana Matte, MD Follow up.   Specialty: Cardiothoracic Surgery Contact information: [place] [address] [phone]               Signed: Ellwood Handler 04/21/2020, 8:57 AM

## 2020-02-26 ENCOUNTER — Inpatient Hospital Stay (HOSPITAL_COMMUNITY): Payer: 59

## 2020-02-26 LAB — BPAM RBC
Blood Product Expiration Date: 202111182359
Blood Product Expiration Date: 202111212359
ISSUE DATE / TIME: 202110210658
ISSUE DATE / TIME: 202110210658
Unit Type and Rh: 1700
Unit Type and Rh: 1700

## 2020-02-26 LAB — TYPE AND SCREEN
ABO/RH(D): B NEG
Antibody Screen: NEGATIVE
Unit division: 0
Unit division: 0

## 2020-02-26 LAB — GLUCOSE, CAPILLARY
Glucose-Capillary: 108 mg/dL — ABNORMAL HIGH (ref 70–99)
Glucose-Capillary: 108 mg/dL — ABNORMAL HIGH (ref 70–99)
Glucose-Capillary: 112 mg/dL — ABNORMAL HIGH (ref 70–99)
Glucose-Capillary: 112 mg/dL — ABNORMAL HIGH (ref 70–99)
Glucose-Capillary: 117 mg/dL — ABNORMAL HIGH (ref 70–99)
Glucose-Capillary: 121 mg/dL — ABNORMAL HIGH (ref 70–99)

## 2020-02-26 MED ORDER — OSMOLITE 1.2 CAL PO LIQD: 1000.0000 mL | ORAL | Status: DC

## 2020-02-26 MED ORDER — PROSOURCE TF PO LIQD
45.0000 mL | Freq: Two times a day (BID) | ORAL | Status: DC
  Administered 2020-02-26 – 2020-03-01 (×9): 45 mL
  Filled 2020-02-26 (×9): qty 45

## 2020-02-26 MED ORDER — OSMOLITE 1.5 CAL PO LIQD: 25.0000 mL | ORAL | Status: DC

## 2020-02-26 MED ORDER — OSMOLITE 1.5 CAL PO LIQD
1000.0000 mL | ORAL | Status: DC
  Administered 2020-02-26: 1000 mL

## 2020-02-26 NOTE — Plan of Care (Signed)

## 2020-02-26 NOTE — Progress Notes (Signed)
Nutrition Follow-up  DOCUMENTATION CODES:   Non-severe (moderate) malnutrition in context of chronic illness  INTERVENTION:   -Initiate Osmolite 1.5 @ 25 ml/hr via J-tube and increase rate by 10 ml every 8 hours to goal rate of 65 ml/hr (1560 ml/day) - ProSource TF 45 ml BID -Tube feeding regimen at goal provides 2420 kcal, 120 grams of protein, and 1189 ml of H2O.   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  Ongoing.  GOAL:   Patient will meet greater than or equal to 90% of their needs  Progressing.  MONITOR:   Diet advancement, Labs, Weight trends, Skin, I & O's  REASON FOR ASSESSMENT:   Consult Enteral/tube feeding initiation and management  ASSESSMENT:   64 year old male who presented on 10/21 for Ivor Lewis esophagectomy and J-tube placement. 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  10/21: s/p XI ROBOTIC ASSISTED IVOR LEWIS ESOPHAGECTOMY (N/A) LAPARSCOPIC  JEJUNOSTOMY TUBE PLACEMENT (N/A) ESOPHAGOGASTRODUODENOSCOPY (EGD) (N/A) INTERCOSTAL NERVE BLOCK (Right)  J-tube ready to be used for tube feeds. TF recommendations provided on 10/22 were ordered.  Per cardiothoracic surgery note, pt to have swallow study next week. Will continue to monitor tolerance and plan going forward.  Admission weight: 219 lbs.  I/Os: +883 ml since admit UOP: 1450 ml  JP drain: 66 ml x 24 hrs Chest tube: 140 ml x 24 hrs NGT: 200 ml  Medications reviewed.   Labs reviewed: CBGs: 112-121  Diet Order:   Diet Order            Diet NPO time specified  Diet effective now                 EDUCATION NEEDS:   Education needs have been  addressed  Skin:  Skin Assessment: Skin Integrity Issues: Skin Integrity Issues:: Incisions Incisions: chest, adomen  Last BM:  no documented BM  Height:   Ht Readings from Last 1 Encounters:  02/24/20 5\' 11"  (1.803 m)    Weight:   Wt Readings from Last 1 Encounters:  02/24/20 99.6 kg   BMI:  Body mass index is 30.61 kg/m.  Estimated Nutritional Needs:   Kcal:  7782-4235  Protein:  110-130 grams  Fluid:  >/= 2.0 L  Clayton Bibles, MS, RD, LDN Inpatient Clinical Dietitian Contact information available via Amion

## 2020-02-26 NOTE — Progress Notes (Addendum)
      [address]       [place] [address]             [phone]       2 Days Post-Op Procedure(s) (LRB): XI ROBOTIC ASSISTED IVOR LEWIS ESOPHAGECTOMY (N/A) LAPARSCOPIC  JEJUNOSTOMY TUBE PLACEMENT (N/A) ESOPHAGOGASTRODUODENOSCOPY (EGD) (N/A) INTERCOSTAL NERVE BLOCK (Right)  Subjective: Patient has some back tenderness (from the bed). He denies nausea  Objective: Vital signs in last 24 hours: Temp:  [98.5 F (36.9 C)-101 F (38.3 C)] 98.6 F (37 C) (10/23 0748) Pulse Rate:  [73-94] 74 (10/23 0748) Cardiac Rhythm: Normal sinus rhythm;Bundle branch block (10/23 0745) Resp:  [15-19] 18 (10/23 0748) BP: (111-137)/(67-86) 137/86 (10/23 0748) SpO2:  [90 %-95 %] 90 % (10/23 0748)     Intake/Output from previous day: 10/22 0701 - 10/23 0700 In: 1310.7 [I.V.:710.7; IV Piggyback:600] Out: 2036 [Urine:1450; Drains:346; Chest Tube:240]   Physical Exam:  Cardiovascular: RRR Pulmonary: Clear to auscultation on right and diminished left base Abdomen: Soft, incisional tenderness, bowel sounds present. Extremities: SCDs in place Wounds: Dressings are clean and dry.  No erythema or signs of infection. Chest Tube:to suction  Lab Results: CBC: Recent Labs    02/24/20 1945 02/25/20 0515  WBC 10.2 8.3  HGB 10.0* 9.3*  HCT 31.5* 28.8*  PLT 142* 125*   BMET:  Recent Labs    02/24/20 1945 02/25/20 0515  NA 140 140  K 3.9 3.7  CL 106 106  CO2 23 25  GLUCOSE 160* 132*  BUN 12 16  CREATININE 1.11 1.01  CALCIUM 8.9 8.1*    PT/INR:  No results for input(s): LABPROT, INR in the last 72 hours. ABG:  INR: Will add last result for INR, ABG once components are confirmed Will add last 4 CBG results once components are confirmed  Assessment/Plan:  1. CV - SR with HR in the 60-70's. 2.  Pulmonary - On 2-3 liters via [place]. Chest tube with 240 cc last 24 hours;right JP drain with 46 cc last 24 hours. Feeding J drain 300 cc last 24 hours. CXR shows small left  pleural effusion/atelectasis and ? trace left apical pneumothorax. 3. GI-NPO. Start tube feedings and Prosouce. NGT to remain for now-200 cc in cannister. IVF at 50 ml/hr. Swallow study next week. 4. CBGs-123/112/121. No history of diabetes but will continue as  on tube feedings. 6. Anemia-H and H this am 9.3 and 28.8 7. Mild thrombocytopenia-platelets this am 125,000 8. Remove foley  Donielle M ZimmermanPA-C 02/26/2020,10:02 AM [phone]   Chart reviewed, patient examined, agree with above. Only complaint is dry mouth and some right back discomfort. In good spirits. Says he has not been out of bed since surgery. Nurse says he has not been up today but not sure about yesterday. Tube feeds going. CXR ok.

## 2020-02-26 NOTE — Progress Notes (Addendum)
Order has been placed to start continuous feeding (J tube) that is currently connected to drainage bag, could not find previous order for drainage system, and no current order to d/c drainage system so that continuous feed can be started, I have talked with dr bartle to get verbal order that it is ok to d/c drainage system from j tube and start continuous feed--patient was asked x2 today to move to chair, 2nd request patient was told dr Cyndia Bent wanted him to move to chair today, patient stated he wanted to wait until tomorrow 10/24--will continue reinforcing the need to get up

## 2020-02-27 ENCOUNTER — Inpatient Hospital Stay (HOSPITAL_COMMUNITY): Payer: 59

## 2020-02-27 LAB — BASIC METABOLIC PANEL
Anion gap: 8 (ref 5–15)
BUN: 16 mg/dL (ref 8–23)
CO2: 25 mmol/L (ref 22–32)
Calcium: 8.2 mg/dL — ABNORMAL LOW (ref 8.9–10.3)
Chloride: 111 mmol/L (ref 98–111)
Creatinine, Ser: 0.66 mg/dL (ref 0.61–1.24)
GFR, Estimated: >60 mL/min (ref >60)
Glucose, Bld: 131 mg/dL — ABNORMAL HIGH (ref 70–99)
Potassium: 3.5 mmol/L (ref 3.5–5.1)
Sodium: 144 mmol/L (ref 135–145)

## 2020-02-27 LAB — GLUCOSE, CAPILLARY
Glucose-Capillary: 118 mg/dL — ABNORMAL HIGH (ref 70–99)
Glucose-Capillary: 125 mg/dL — ABNORMAL HIGH (ref 70–99)
Glucose-Capillary: 126 mg/dL — ABNORMAL HIGH (ref 70–99)
Glucose-Capillary: 119 mg/dL — ABNORMAL HIGH (ref 70–99)
Glucose-Capillary: 125 mg/dL — ABNORMAL HIGH (ref 70–99)

## 2020-02-27 LAB — CBC
HCT: 30.3 % — ABNORMAL LOW (ref 39.0–52.0)
Hemoglobin: 9.7 g/dL — ABNORMAL LOW (ref 13.0–17.0)
MCH: 29.6 pg (ref 26.0–34.0)
MCHC: 32 g/dL (ref 30.0–36.0)
MCV: 92.4 fL (ref 80.0–100.0)
Platelets: 129 10*3/uL — ABNORMAL LOW (ref 150–400)
RBC: 3.28 MIL/uL — ABNORMAL LOW (ref 4.22–5.81)
RDW: 21.6 % — ABNORMAL HIGH (ref 11.5–15.5)
WBC: 12 10*3/uL — ABNORMAL HIGH (ref 4.0–10.5)
nRBC: 0 % (ref 0.0–0.2)

## 2020-02-27 MED ORDER — SORBITOL 70 % SOLN
30.0000 mL | Freq: Once | Status: DC
  Filled 2020-02-27: qty 30

## 2020-02-27 MED ORDER — POTASSIUM CHLORIDE 20 MEQ/15ML (10%) PO SOLN
40.0000 meq | Freq: Once | ORAL | Status: AC
  Administered 2020-02-27: 40 meq
  Filled 2020-02-27: qty 30

## 2020-02-27 NOTE — Progress Notes (Addendum)
      [address]       [place] [address]             [phone]       3 Days Post-Op Procedure(s) (LRB): XI ROBOTIC ASSISTED IVOR LEWIS ESOPHAGECTOMY (N/A) LAPARSCOPIC  JEJUNOSTOMY TUBE PLACEMENT (N/A) ESOPHAGOGASTRODUODENOSCOPY (EGD) (N/A) INTERCOSTAL NERVE BLOCK (Right)  Subjective: Patient sitting in chair. He feels like he needs to have a bowel movement but not able to since surgery.  Objective: Vital signs in last 24 hours: Temp:  [98 F (36.7 C)-99.6 F (37.6 C)] 98.6 F (37 C) (10/24 0805) Pulse Rate:  [66-80] 66 (10/24 0805) Cardiac Rhythm: Normal sinus rhythm (10/24 0700) Resp:  [15-20] 15 (10/24 0356) BP: (124-146)/(66-89) 124/66 (10/24 0805) SpO2:  [91 %-95 %] 94 % (10/24 0805)     Intake/Output from previous day: 10/23 0701 - 10/24 0700 In: 585.4 [I.V.:500; NG/GT:85.4] Out: 1305 [Urine:900; Emesis/NG output:260; Drains:75; Chest Tube:70]   Physical Exam:  Cardiovascular: RRR Pulmonary: Clear to auscultation on right and diminished left base Abdomen: Soft, incisional tenderness, bowel sounds present. Extremities: Trace LE edema Wounds: Dressings are clean and dry.  No erythema or signs of infection. Chest Tube:to suction  Lab Results: CBC: Recent Labs    02/25/20 0515 02/27/20 0400  WBC 8.3 12.0*  HGB 9.3* 9.7*  HCT 28.8* 30.3*  PLT 125* 129*   BMET:  Recent Labs    02/25/20 0515 02/27/20 0400  NA 140 144  K 3.7 3.5  CL 106 111  CO2 25 25  GLUCOSE 132* 131*  BUN 16 16  CREATININE 1.01 0.66  CALCIUM 8.1* 8.2*    PT/INR:  No results for input(s): LABPROT, INR in the last 72 hours. ABG:  INR: Will add last result for INR, ABG once components are confirmed Will add last 4 CBG results once components are confirmed  Assessment/Plan:  1. CV - SR with HR in the 60-70's. 2.  Pulmonary - On 3 liters via [place]. Chest tube with 70 cc last 24 hours;right JP drain with 75 cc last 24 hours.  CXR shows small left pleural  effusion/atelectasis and right lung clear. Encourage incentive spirometer. 3. GI-NPO. Tolerating tube feedings at 45 ml/hr and Prosouce. NGT to remain for now-260 cc last 24 hours. IVF at 50 ml/hr. Swallow study next week. 4. CBGs-108/118/125. No history of diabetes but will continue as  on tube feedings. 6. Anemia-H and H this am stable at 9.7 and 30.3 7. Mild thrombocytopenia-platelets this am up to 129,000 8. Sorbitol per tube for constipation 9. Supplement potassium 10. Increase activity   Donielle M ZimmermanPA-C 02/27/2020,9:23 AM [phone]   Chart reviewed, patient examined, agree with above. He is comfortable and tolerating tube feeds.

## 2020-02-27 NOTE — Progress Notes (Signed)
Tube feed setup changed at this time per unit policy. Pt tolerated well.

## 2020-02-28 ENCOUNTER — Inpatient Hospital Stay (HOSPITAL_COMMUNITY): Payer: 59

## 2020-02-28 ENCOUNTER — Other Ambulatory Visit: Payer: Self-pay | Admitting: *Deleted

## 2020-02-28 LAB — BASIC METABOLIC PANEL
Anion gap: 8 (ref 5–15)
BUN: 21 mg/dL (ref 8–23)
CO2: 25 mmol/L (ref 22–32)
Calcium: 8.2 mg/dL — ABNORMAL LOW (ref 8.9–10.3)
Chloride: 111 mmol/L (ref 98–111)
Creatinine, Ser: 0.66 mg/dL (ref 0.61–1.24)
GFR, Estimated: >60 mL/min (ref >60)
Glucose, Bld: 123 mg/dL — ABNORMAL HIGH (ref 70–99)
Potassium: 3.4 mmol/L — ABNORMAL LOW (ref 3.5–5.1)
Sodium: 144 mmol/L (ref 135–145)

## 2020-02-28 LAB — GLUCOSE, CAPILLARY
Glucose-Capillary: 116 mg/dL — ABNORMAL HIGH (ref 70–99)
Glucose-Capillary: 117 mg/dL — ABNORMAL HIGH (ref 70–99)
Glucose-Capillary: 117 mg/dL — ABNORMAL HIGH (ref 70–99)
Glucose-Capillary: 122 mg/dL — ABNORMAL HIGH (ref 70–99)
Glucose-Capillary: 137 mg/dL — ABNORMAL HIGH (ref 70–99)
Glucose-Capillary: 141 mg/dL — ABNORMAL HIGH (ref 70–99)
Glucose-Capillary: 83 mg/dL (ref 70–99)

## 2020-02-28 LAB — SURGICAL PATHOLOGY

## 2020-02-28 MED ORDER — FREE WATER
150.0000 mL | Status: DC
  Administered 2020-02-28 – 2020-03-01 (×12): 150 mL

## 2020-02-28 MED ORDER — OSMOLITE 1.5 CAL PO LIQD
1000.0000 mL | ORAL | Status: DC
  Administered 2020-02-28 – 2020-03-01 (×2): 1000 mL
  Filled 2020-02-28: qty 1000

## 2020-02-28 NOTE — Progress Notes (Signed)
Ambulated patient 260 feet and returned to patient room.  Reapplied suction to NG and pinkish secretions suctioned out.  Cleared within 30 minutes.  Patient denies pain or cramping.  Felt good after going for walk

## 2020-02-28 NOTE — Progress Notes (Signed)
Brief Nutrition Note  Tube feeds of Osmolite 1.5 cal formula increased to goal rate of 65 ml/hr this morning. RD contacted by RN regarding ordering free water flushes. Per RN, IV fluids now off.  Current tube feeding regimen provides 1189 ml of free water. RD to order free water flushes of 150 ml q 4 hours to provide an additional 900 ml of free water (total free water: 2089 ml).  Will continue to follow pt during admission.   Gaynell Face, MS, RD, LDN Inpatient Clinical Dietitian Please see AMiON for contact information.

## 2020-02-28 NOTE — Patient Outreach (Signed)
[name]) Care Management  02/28/2020  Brett Dawson July 17, 1955 [id number]   [place] coordination- note hospitalization  Pt noted with scheduled admission for robotic assisted Ivor-Lewis esophagectomy with jejunostomy tube placement by Dr Kipp Brood   4th day post op scheduled for swallowing test 02/29/20   Plan [facility] RN CM will follow up with patient within after updated by [facility] hospital liaison of discharge   [place]. Lavina Hamman, RN, BSN, [place] Coordinator Office number [phone] Mobile number [phone]  Main [facility] number [id number] Fax number [phone]

## 2020-02-28 NOTE — Progress Notes (Addendum)
      [address]       [place] [address]             [phone]       4 Days Post-Op Procedure(s) (LRB): XI ROBOTIC ASSISTED IVOR LEWIS ESOPHAGECTOMY (N/A) LAPARSCOPIC  JEJUNOSTOMY TUBE PLACEMENT (N/A) ESOPHAGOGASTRODUODENOSCOPY (EGD) (N/A) INTERCOSTAL NERVE BLOCK (Right)  Subjective: Resting in bed, says he was up in the chair for several hours yesterday. TF infusing. No Bm yet but says he feels like he needs to. ( He did not take the Sorbitol yesterday)   Objective: Vital signs in last 24 hours: Temp:  [98.1 F (36.7 C)-98.6 F (37 C)] 98.6 F (37 C) (10/25 0748) Pulse Rate:  [68-101] 68 (10/25 0748) Cardiac Rhythm: Normal sinus rhythm (10/25 0720) Resp:  [14-18] 18 (10/25 0439) BP: (112-147)/(66-83) 142/80 (10/25 0748) SpO2:  [92 %-99 %] 97 % (10/25 0748) Weight:  [96.2 kg] 96.2 kg (10/25 0439)     Intake/Output from previous day: 10/24 0701 - 10/25 0700 In: 1452.1 [I.V.:587.1; NG/GT:865] Out: 5102 [Urine:1000; Emesis/NG output:100; Drains:20; Chest Tube:50]   Physical Exam:  Cardiovascular: RRR Pulmonary: Clear to auscultation on right and diminished left base Abdomen: Soft, incisional tenderness, bowel sounds present. J-tube site dressing is dry.  NG drained 864ml past 24 hours.  Extremities: Trace LE edema Wounds: Dressings are clean and dry.  No erythema or signs of infection. Chest Tube:to suction, minimal drainage. Minimal drainage from left pleural JP.    Lab Results: CBC: Recent Labs    02/27/20 0400  WBC 12.0*  HGB 9.7*  HCT 30.3*  PLT 129*   BMET:  Recent Labs    02/27/20 0400 02/28/20 0430  NA 144 144  K 3.5 3.4*  CL 111 111  CO2 25 25  GLUCOSE 131* 123*  BUN 16 21  CREATININE 0.66 0.66  CALCIUM 8.2* 8.2*    PT/INR:  No results for input(s): LABPROT, INR in the last 72 hours. ABG:  INR: Will add last result for INR, ABG once components are confirmed Will add last 4 CBG results once components are  confirmed  Assessment/Plan:  1. CV - Stable SR. 2.  Pulmonary - Remains on 3 liters via [place]. Chest tube with 50 cc last 24 hours; right JP drain with 75 cc last 24 hours.  CXR shows small left pleural effusion/atelectasis and right lung clear. Encourage incentive spirometer. 3. GI-NPO. Tolerating tube feedings at 45 ml/hr and Prosouce. NGT to remain for high output. IVF at 50 ml/hr. Swallow study in next day or so. Will discuss with Dr. Kipp Brood.  4. CBGs-116-137 past 24 hours. No history of diabetes but will continue as on tube feedings. 6. Anemia-H and H has been stable, monitor. 7. Mild thrombocytopenia-resolved 8. Sorbitol per tube for constipation if unable to have BM this morning 9. Supplement potassium 10. Advance activity   Brett Dawson [phone] 02/28/2020,8:23 AM  Agree with above. Overall doing well Increasing tube feeds at goal We will obtain swallow study tomorrow.  Brett Dawson

## 2020-02-28 NOTE — Plan of Care (Signed)

## 2020-02-29 ENCOUNTER — Inpatient Hospital Stay (HOSPITAL_COMMUNITY): Payer: 59

## 2020-02-29 ENCOUNTER — Encounter (HOSPITAL_COMMUNITY): Payer: Self-pay | Admitting: Thoracic Surgery (Cardiothoracic Vascular Surgery)

## 2020-02-29 LAB — BASIC METABOLIC PANEL
Anion gap: 8 (ref 5–15)
BUN: 20 mg/dL (ref 8–23)
CO2: 27 mmol/L (ref 22–32)
Calcium: 8.2 mg/dL — ABNORMAL LOW (ref 8.9–10.3)
Chloride: 109 mmol/L (ref 98–111)
Creatinine, Ser: 0.65 mg/dL (ref 0.61–1.24)
GFR, Estimated: >60 mL/min (ref >60)
Glucose, Bld: 115 mg/dL — ABNORMAL HIGH (ref 70–99)
Potassium: 3.4 mmol/L — ABNORMAL LOW (ref 3.5–5.1)
Sodium: 144 mmol/L (ref 135–145)

## 2020-02-29 LAB — GLUCOSE, CAPILLARY
Glucose-Capillary: 111 mg/dL — ABNORMAL HIGH (ref 70–99)
Glucose-Capillary: 112 mg/dL — ABNORMAL HIGH (ref 70–99)
Glucose-Capillary: 122 mg/dL — ABNORMAL HIGH (ref 70–99)
Glucose-Capillary: 93 mg/dL (ref 70–99)
Glucose-Capillary: 92 mg/dL (ref 70–99)

## 2020-02-29 MED ORDER — IOHEXOL 300 MG/ML  SOLN
150.0000 mL | Freq: Once | INTRAMUSCULAR | Status: AC | PRN
  Administered 2020-02-29: 150 mL via ORAL

## 2020-02-29 NOTE — Progress Notes (Signed)
SLP Note  Patient Details Name: Brett Dawson MRN: [id number] DOB: July 29, 1955  Orders received for swallow assessment.  Hx reviewed - pt will benefit from an MBS to determine swallow physiology s/p esophagectomy.  Will schedule for next date.   Brett Dawson L. Tivis Ringer, [place] CCC/SLP Acute Rehabilitation Services Office number [phone] Pager [phone]       Brett Dawson 02/29/2020, 2:51 PM

## 2020-02-29 NOTE — Evaluation (Signed)
Physical Therapy Evaluation Patient Details Name: Brett Dawson MRN: [id number] DOB: [birth year] Today's Date: 02/29/2020   History of Present Illness  64 yo male with Esophageal cancer. s/p 02/24/20 robotic assisted Ivor-Lewis esophagectomy with jejunostomy tube placement  Clinical Impression  PTA pt living alone in single story efficiency with 3 steps to enter. 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 Pt is currently limited in safe mobility by decreased strength, balance and endurance. Pt is min A for transfers and ambulation with RW. PT recommending HHPT to help with restoration of PLOF. PT will continue to follow acutely     Follow Up Recommendations Home health PT;Supervision - Intermittent    Equipment Recommendations  None recommended by PT       Precautions / Restrictions Precautions Precautions: None Precaution Comments: chest tube, JP drain, jejunostomy tube Restrictions Weight Bearing Restrictions: No      Mobility  Bed Mobility               General bed mobility comments: OOB on entry     Transfers Overall transfer level: Needs assistance Equipment used: Rolling walker (2 wheeled) Transfers: Sit to/from Stand Sit to Stand: Min assist         General transfer comment: min a for steadying in standing  Ambulation/Gait Ambulation/Gait assistance: Min guard;Min assist Gait Distance (Feet): 400 Feet Assistive device: Rolling walker (2 wheeled) Gait Pattern/deviations: Step-through pattern;Shuffle;Narrow base of support Gait velocity: slowed   General Gait Details: min guard progressing to light min A for steadying due to fatigue, vc for keeping RW on floor      Balance Overall balance assessment: Needs assistance Sitting-balance support: Feet supported;No upper extremity supported Sitting balance-Leahy Scale: Good     Standing balance support: No upper extremity supported;During functional  activity Standing balance-Leahy Scale: Fair                               Pertinent Vitals/Pain Pain Assessment: No/denies pain    Home Living Family/patient expects to be discharged to:: Private residence Living Arrangements: Alone Available Help at Discharge: Family;Friend(s);Available PRN/intermittently Type of Home: House Home Access: Stairs to enter Entrance Stairs-Rails: None Entrance Stairs-Number of Steps: 3 Home Layout: One level Home Equipment: Environmental consultant - 2 wheels      Prior Function Level of Independence: Independent         Comments: working as Education officer, 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     Higher education careers adviser Dominance   Dominant Hand: Right    Extremity/Trunk Assessment   Upper Extremity Assessment Upper Extremity Assessment: Overall WFL for tasks assessed    Lower Extremity Assessment Lower Extremity Assessment: Generalized weakness       Communication   Communication: No difficulties  Cognition Arousal/Alertness: Awake/alert Behavior During Therapy: WFL for tasks assessed/performed Overall Cognitive Status: Within Functional Limits for tasks assessed                                        General Comments General comments (skin integrity, edema, etc.): VSS on RA        Assessment/Plan    PT Assessment Patient needs continued PT services  PT Problem List Decreased balance;Decreased activity tolerance;Decreased mobility       PT Treatment Interventions DME instruction;Gait training;Stair training;Functional mobility training;Therapeutic activities;Therapeutic exercise;Balance training;Cognitive remediation;Patient/family education    PT  Goals (Current goals can be found in the Care Plan section)  Acute Rehab PT Goals Patient Stated Goal: get back to work PT Goal Formulation: With patient Time For Goal Achievement: 03/14/20 Potential to Achieve Goals: Good    Frequency Min 3X/week   Barriers to discharge Decreased caregiver  support         AM-PAC PT "6 Clicks" Mobility  Outcome Measure Help needed turning from your back to your side while in a flat bed without using bedrails?: None Help needed moving from lying on your back to sitting on the side of a flat bed without using bedrails?: A Little Help needed moving to and from a bed to a chair (including a wheelchair)?: None Help needed standing up from a chair using your arms (e.g., wheelchair or bedside chair)?: None Help needed to walk in hospital room?: None Help needed climbing 3-5 steps with a railing? : A Little 6 Click Score: 22    End of Session Equipment Utilized During Treatment: Gait belt Activity Tolerance: Patient tolerated treatment well Patient left: in chair;with call bell/phone within reach;Other (comment) (CSW in room ) Nurse Communication: Mobility status PT Visit Diagnosis: Other abnormalities of gait and mobility (R26.89)    Time: 1451-1516 PT Time Calculation (min) (ACUTE ONLY): 25 min   Charges:   PT Evaluation $PT Eval Moderate Complexity: 1 Mod PT Treatments $Gait Training: 8-22 mins        Rafik Koppel B. [name] PT, DPT Acute Rehabilitation Services Pager [phone] Office [phone]   [name] Fleet 02/29/2020, 3:36 PM

## 2020-02-29 NOTE — Progress Notes (Addendum)
      [address]       ,[place] [address]             [phone]       5 Days Post-Op Procedure(s) (LRB): XI ROBOTIC ASSISTED IVOR LEWIS ESOPHAGECTOMY (N/A) LAPARSCOPIC  JEJUNOSTOMY TUBE PLACEMENT (N/A) ESOPHAGOGASTRODUODENOSCOPY (EGD) (N/A) INTERCOSTAL NERVE BLOCK (Right)  Subjective: Resting in bed, says he walked in the hall yesterday. TF has been infusing at goal 72ml/hr.  Bm x 2 yesterday. No new concerns.   Objective: Vital signs in last 24 hours: Temp:  [98.1 F (36.7 C)-99.5 F (37.5 C)] 99.1 F (37.3 C) (10/26 0739) Pulse Rate:  [70-76] 70 (10/26 0739) Cardiac Rhythm: Sinus bradycardia (10/26 0701) Resp:  [18] 18 (10/25 1100) BP: (134-154)/(74-92) 134/84 (10/26 0739) SpO2:  [95 %-97 %] 97 % (10/26 0739) Weight:  [95.8 kg] 95.8 kg (10/26 0500)     Intake/Output from previous day: 10/25 0701 - 10/26 0700 In: 1264.3 [NG/GT:1264.3] Out: 695 [Urine:650; Drains:25; Chest Tube:20]   Physical Exam:  Cardiovascular: RRR Pulmonary: Clear to auscultation Abdomen: Soft, no tenderness, bowel sounds present. J-tube site dressing is dry.   Extremities: Trace LE edema Wounds: Port incisions are open to air and are intact and dry. J-tube dressing has af small area of staining Chest Tube:to suction, minimal drainage. Minimal drainage from left pleural JP.    Lab Results: CBC: Recent Labs    02/27/20 0400  WBC 12.0*  HGB 9.7*  HCT 30.3*  PLT 129*   BMET:  Recent Labs    02/28/20 0430 02/29/20 0430  NA 144 144  K 3.4* 3.4*  CL 111 109  CO2 25 27  GLUCOSE 123* 115*  BUN 21 20  CREATININE 0.66 0.65  CALCIUM 8.2* 8.2*    PT/INR:  No results for input(s): LABPROT, INR in the last 72 hours. ABG:  INR: Will add last result for INR, ABG once components are confirmed Will add last 4 CBG results once components are confirmed  Assessment/Plan:  1. CV - Stable SR. 2.  Pulmonary - Remains on 3 liters via [place]. Chest tube with 50 cc last 24  hours; right JP drain with 75 cc last 24 hours.  CXR shows small left pleural effusion/atelectasis and right lung clear. Encourage incentive spirometer. 3. GI-NPO. Tolerating tube feedings at 65 ml/hr and Prosouce. Swallow study this morning 4. CBGs- 111-141 past 24 hours. No history of diabetes but will continue as on tube feedings. 6. Anemia-H and H has been stable, CBC in am.  7. Mild thrombocytopenia-resolved 8. Supplement potassium 9. Continue PT   Joline Maxcy [phone] 02/29/2020,7:51 AM  Agree with above No leak on swallow. Will remove NG tube Speech therapy today Likely clears soon.  Will remove argyle chest tube once on clears.  Caedon Bond [name]

## 2020-02-29 NOTE — Care Management (Signed)
Per [facility] W/[facility] @[phone] Patient has [place] covers HHRN,PT and OT. Pre-Auth is [email or web address]. Some providers are :  [facility] care[facility] [phone] Transition of care [facility] [phone] .[facility] - [phone]

## 2020-02-29 NOTE — Progress Notes (Signed)
PT Cancellation Note  Patient Details Name: [name] MRN: [id number] DOB: [birth year]   Cancelled Treatment:    Reason Eval/Treat Not Completed: (P) Patient at procedure or test/unavailable Pt off floor for procedure. PT will follow back for Evaluation this afternoon.  Abbiegail Landgren B. Migdalia Dk PT, DPT Acute Rehabilitation Services Pager [phone] Office [phone]    [place] 02/29/2020, 9:43 AM

## 2020-02-29 NOTE — Progress Notes (Signed)
Nutrition Follow-up  DOCUMENTATION CODES:   Non-severe (moderate) malnutrition in context of chronic illness  INTERVENTION:   Continue tube feeding: - Osmolite 1.5 @ 65 ml/hr (1560 ml/day) - ProSource TF 45 ml BID - Free water flushes of 150 ml q 4 hours  Tube feeding regimen provides 2420 kcal, 120 grams of protein, and 1189 ml of H2O.  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  Ongoing  GOAL:   Patient will meet greater than or equal to 90% of their needs  Met via TF  MONITOR:   Diet advancement, Labs, Weight trends, Skin, I & O's  REASON FOR ASSESSMENT:   Consult Enteral/tube feeding initiation and management  ASSESSMENT:   64 year old male who presented on 10/21 for Ivor Lewis esophagectomy and J-tube placement. 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  10/22 - TF initiated 10/26 - swallow study, no evidence of leak, NGT removed  SLP evaluation pending. Per MD note, "likely clears soon" and plan is to remove chest tube once pt on clears.  Spoke with pt at bedside. Pt reports just returning from x-ray. RN in room to reconnect pt to TF pump. Pt denies any issues with TF at this time. He denies abdominal pain or bloating. Pt has been having BMs. Will continue with current TF regimen at this time.  Current TF regimen: Osmolite 1.5 @ 65 ml/hr, ProSource TF 45 ml BID, free water flushes of 150 ml q 4 hours  Admit weight: 99.6 kg Current weight: 95.8 kg  Medications reviewed and  include: SSI q 4 hours, IV protonix  Labs reviewed: potassium 3.4, hemoglobin 9.7 CBG's: 83-141 x 24 hours  UOP: 650 ml x 24 hours RUQ JP drain: 25 ml x 24 hours CT: 20 ml x 24 hours I/O's: +1.0 L since admit  Diet Order:   Diet Order            Diet NPO time specified  Diet effective now                 EDUCATION NEEDS:   Education needs have been addressed  Skin:  Skin Assessment: Skin Integrity Issues: Incisions: chest, adomen  Last BM:  02/28/20  Height:   Ht Readings from Last 1 Encounters:  02/24/20 '5\' 11"'  (1.803 m)    Weight:   Wt Readings from Last 1 Encounters:  02/29/20 95.8 kg    Ideal Body Weight:  78.2 kg  BMI:  Body mass index is 29.46 kg/m.  Estimated Nutritional Needs:   Kcal:  5320-2334  Protein:  110-130 grams  Fluid:  >/= 2.0 L    Gaynell Face, MS, RD, LDN Inpatient Clinical Dietitian Please see AMiON for contact information.

## 2020-02-29 NOTE — TOC Progression Note (Addendum)
Transition of Care [facility]) - Progression Note    Patient Details  Name: TYRICE HEWITT MRN: [id number] Date of Birth: 1955-05-24  Transition of [facility]) CM/SW Contact  Zenon Mayo, RN Phone Number: 02/29/2020, 3:17 PM  Clinical Narrative:    NCM spoke with patient at bedside, offered choice for HHPT, HHRN he states he does not have a preference.  NCM made referral to [place] with [facility], awaiting to hear back to see if she can take referral for HHPT and [place],  NCM also contacted Carolynn Sayers regarding tube feeds for home.  Per Junie Panning with [facility] she can take referral for [facility], [place].         Expected Discharge Plan and Services                                                 Social Determinants of Health (SDOH) Interventions    Readmission Risk Interventions No flowsheet data found.

## 2020-03-01 ENCOUNTER — Inpatient Hospital Stay (HOSPITAL_COMMUNITY): Payer: 59

## 2020-03-01 LAB — GLUCOSE, CAPILLARY
Glucose-Capillary: 108 mg/dL — ABNORMAL HIGH (ref 70–99)
Glucose-Capillary: 112 mg/dL — ABNORMAL HIGH (ref 70–99)
Glucose-Capillary: 116 mg/dL — ABNORMAL HIGH (ref 70–99)
Glucose-Capillary: 124 mg/dL — ABNORMAL HIGH (ref 70–99)
Glucose-Capillary: 130 mg/dL — ABNORMAL HIGH (ref 70–99)
Glucose-Capillary: 147 mg/dL — ABNORMAL HIGH (ref 70–99)
Glucose-Capillary: 84 mg/dL (ref 70–99)

## 2020-03-01 LAB — BASIC METABOLIC PANEL
Anion gap: 8 (ref 5–15)
BUN: 19 mg/dL (ref 8–23)
CO2: 26 mmol/L (ref 22–32)
Calcium: 8.3 mg/dL — ABNORMAL LOW (ref 8.9–10.3)
Chloride: 105 mmol/L (ref 98–111)
Creatinine, Ser: 0.65 mg/dL (ref 0.61–1.24)
GFR, Estimated: >60 mL/min (ref >60)
Glucose, Bld: 123 mg/dL — ABNORMAL HIGH (ref 70–99)
Potassium: 3.5 mmol/L (ref 3.5–5.1)
Sodium: 139 mmol/L (ref 135–145)

## 2020-03-01 LAB — CBC
HCT: 28.7 % — ABNORMAL LOW (ref 39.0–52.0)
Hemoglobin: 9.4 g/dL — ABNORMAL LOW (ref 13.0–17.0)
MCH: 30 pg (ref 26.0–34.0)
MCHC: 32.8 g/dL (ref 30.0–36.0)
MCV: 91.7 fL (ref 80.0–100.0)
Platelets: 161 10*3/uL (ref 150–400)
RBC: 3.13 MIL/uL — ABNORMAL LOW (ref 4.22–5.81)
RDW: 19.8 % — ABNORMAL HIGH (ref 11.5–15.5)
WBC: 9.9 10*3/uL (ref 4.0–10.5)
nRBC: 0 % (ref 0.0–0.2)

## 2020-03-01 MED ORDER — OSMOLITE 1.5 CAL PO LIQD
1520.0000 mL | ORAL | Status: DC
  Administered 2020-03-01 – 2020-03-12 (×18): 1520 mL
  Administered 2020-03-12: 1000 mL
  Administered 2020-03-13: 1520 mL
  Administered 2020-03-14: 1000 mL
  Administered 2020-03-14: 1520 mL
  Administered 2020-03-15: 1000 mL
  Administered 2020-03-16 (×2): 1520 mL
  Filled 2020-03-01 (×7): qty 2000

## 2020-03-01 MED ORDER — FREE WATER
100.0000 mL | Status: DC
  Administered 2020-03-01 – 2020-03-10 (×36): 100 mL

## 2020-03-01 MED ORDER — ACETAMINOPHEN 325 MG PO TABS
650.0000 mg | ORAL_TABLET | ORAL | Status: DC | PRN
  Administered 2020-03-01 – 2020-03-05 (×8): 650 mg
  Filled 2020-03-01 (×10): qty 2

## 2020-03-01 MED ORDER — BOOST / RESOURCE BREEZE PO LIQD CUSTOM
1.0000 | Freq: Three times a day (TID) | ORAL | Status: DC
  Administered 2020-03-01 – 2020-03-04 (×6): 1 via ORAL

## 2020-03-01 MED ORDER — ACETAMINOPHEN 325 MG PO TABS: 650.0000 mg | ORAL_TABLET | ORAL | Status: DC | PRN

## 2020-03-01 NOTE — Evaluation (Signed)
Occupational Therapy Evaluation Patient Details Name: Brett Dawson MRN: [id number] DOB: 29-Jun-1955 Today's Date: 03/01/2020    History of Present Illness 64 yo male with Esophageal cancer. s/p 02/24/20 robotic assisted Ivor-Lewis esophagectomy with jejunostomy tube placement   Clinical Impression   Pt is independent at baseline. Presents with mild weakness. He requires up to min guard assist for ADL and ADL transfers. Pt likely to progress well for return home. Will follow acutely.    Follow Up Recommendations  No OT follow up    Equipment Recommendations  None recommended by OT    Recommendations for Other Services       Precautions / Restrictions Precautions Precautions: Other (comment) Precaution Comments: chest tube, JP drain, jejunostomy tube Restrictions Weight Bearing Restrictions: No      Mobility Bed Mobility Overal bed mobility: Modified Independent                  Transfers Overall transfer level: Needs assistance Equipment used: None Transfers: Sit to/from [facility] Sit to Stand: Min guard Stand pivot transfers: Min guard            Balance Overall balance assessment: Needs assistance   Sitting balance-Leahy Scale: Normal       Standing balance-Leahy Scale: Fair                             ADL either performed or assessed with clinical judgement   ADL Overall ADL's : Needs assistance/impaired Eating/Feeding: Independent   Grooming: Wash/dry hands;Sitting;Set up   Upper Body Bathing: Set up;Sitting   Lower Body Bathing: Min guard;Sit to/from stand   Upper Body Dressing : Set up;Sitting   Lower Body Dressing: Min guard;Sit to/from stand   Toilet Transfer: Min guard;Stand-pivot;BSC   Toileting- Water quality scientist and Hygiene: Min guard;Sit to/from stand       Functional mobility during ADLs: Min guard       Vision Baseline Vision/History: Wears glasses Wears Glasses: At all  times Patient Visual Report: No change from baseline       Perception     Praxis      Pertinent Vitals/Pain Pain Assessment: No/denies pain     Hand Dominance Right   Extremity/Trunk Assessment Upper Extremity Assessment Upper Extremity Assessment: Overall WFL for tasks assessed   Lower Extremity Assessment Lower Extremity Assessment: Defer to PT evaluation   Cervical / Trunk Assessment Cervical / Trunk Assessment: Normal   Communication Communication Communication: No difficulties   Cognition Arousal/Alertness: Awake/alert Behavior During Therapy: WFL for tasks assessed/performed Overall Cognitive Status: Within Functional Limits for tasks assessed                                     General Comments       Exercises     Shoulder Instructions      Home Living Family/patient expects to be discharged to:: Private residence Living Arrangements: Alone Available Help at Discharge: Family;Friend(s);Available PRN/intermittently Type of Home: House Home Access: Stairs to enter [employer] of Steps: 3 Entrance Stairs-Rails: None Home Layout: One level     Bathroom Shower/Tub: Teacher, early years/pre: Standard     Home Equipment: Environmental consultant - 2 wheels          Prior Functioning/Environment Level of Independence: Independent        Comments: working as Therapist, 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  OT Frequency: Min 2X/week   Barriers to D/C:            Co-evaluation              AM-PAC OT "6 Clicks" Daily Activity     Outcome Measure Help from another person eating meals?: None Help from another person taking care of personal grooming?: A Little Help from another person toileting, which includes using toliet, bedpan, or urinal?: A Little Help from another person bathing (including washing, rinsing, drying)?: A Little Help from another person to put on and taking off regular upper body clothing?: None Help from another person to put on and taking off regular lower body clothing?: A Little 6 Click Score: 20   End of Session Nurse Communication: Mobility status  Activity Tolerance: Patient tolerated treatment well Patient left: in chair;with call bell/phone within reach  OT Visit Diagnosis: Muscle weakness (generalized) (M62.81)                Time: 8841-6606 OT Time Calculation (min): 15 min Charges:  OT General Charges $OT Visit: 1 Visit OT Evaluation $OT Eval Moderate Complexity: 1 Mod  Nestor Lewandowsky, OTR/L Acute Rehabilitation Services Pager: [phone] Office: [phone]  Malka So 03/01/2020, 10:58 AM

## 2020-03-01 NOTE — Progress Notes (Signed)
   03/01/20 1911  Assess: MEWS Score  Temp (!) 102.2 F (39 C)  BP (!) 113/58  Pulse Rate 93  ECG Heart Rate 93  Resp 16  Level of Consciousness Alert  SpO2 93 %  O2 Device Room Air  Patient Activity (if Appropriate) In chair  Assess: MEWS Score  MEWS Temp 2  MEWS Systolic 0  MEWS Pulse 0  MEWS RR 0  MEWS LOC 0  MEWS Score 2  MEWS Score Color Yellow  Assess: if the MEWS score is Yellow or Red  Were vital signs taken at a resting state? Yes  Focused Assessment Change from prior assessment (see assessment flowsheet)  Early Detection of Sepsis Score *See Row Information* Medium  MEWS guidelines implemented *See Row Information* Yes  Treat  MEWS Interventions Other (Comment) (MD made aware and new PRN order received)  Pain Scale 0-10  Pain Score 0  Take Vital Signs  Increase Vital Sign Frequency  Yellow: Q 2hr X 2 then Q 4hr X 2, if remains yellow, continue Q 4hrs  Escalate  MEWS: Escalate Yellow: discuss with charge nurse/RN and consider discussing with provider and RRT  Notify: Charge Nurse/RN  Name of Charge Nurse/RN Notified Tanya, RN  Date Charge Nurse/RN Notified 03/01/20  Time Charge Nurse/RN Notified 1941  Notify: Provider  Provider Name/Title Dr. Cyndia Bent  Date Provider Notified 03/01/20  Time Provider Notified 1935  Notification Type Page  Notification Reason Change in status  Response See new orders  Date of Provider Response 03/01/20  Time of Provider Response [phone]

## 2020-03-01 NOTE — Plan of Care (Signed)

## 2020-03-01 NOTE — Consult Note (Signed)
[facility] Inpatient Consult   03/01/2020  KAIEA LEWEY 1955-05-20 [id number]  [facility] [[facility]]  Accountable Care Organization [ACO] Patient: [facility]  Patient is currently active with [facility] [[facility]] Care Management for chronic disease management services.  Patient has been engaged by a [facility].  Our community based plan of care has focused on disease management and community resource support.    Patient will receive a post hospital call and will be evaluated for assessments and disease process education.    Plan:  Will continue to follow for disposition and needs and notify [facility] RN Care Coordinator of any transitional needs. Will follow up with Inpatient Transition Of Care [TOC] team member to make aware that [facility] Care Management following.   Of note, [facility] Care Management services does not replace or interfere with any services that are needed or arranged by inpatient [facility] care management team.  For additional questions or referrals please contact:   [name], RN BSN CCM [facility]  [phone] business mobile phone Toll free office [phone]  Fax number: [phone] [email or web address]

## 2020-03-01 NOTE — Progress Notes (Addendum)
      [address]       [place] [address]             [phone]       6 Days Post-Op Procedure(s) (LRB): XI ROBOTIC ASSISTED IVOR LEWIS ESOPHAGECTOMY (N/A) LAPARSCOPIC  JEJUNOSTOMY TUBE PLACEMENT (N/A) ESOPHAGOGASTRODUODENOSCOPY (EGD) (N/A) INTERCOSTAL NERVE BLOCK (Right)  Subjective: Resting in bed, says he is feeling better each day. Much more comfortable after NG removed yesterday.   Objective: Vital signs in last 24 hours: Temp:  [98.4 F (36.9 C)-99.8 F (37.7 C)] 99.3 F (37.4 C) (10/27 0731) Pulse Rate:  [69-83] 76 (10/27 0731) Cardiac Rhythm: Normal sinus rhythm (10/27 0701) Resp:  [14-19] 14 (10/27 0320) BP: (114-141)/(62-73) 114/68 (10/27 0731) SpO2:  [91 %-95 %] 91 % (10/27 0731) Weight:  [98.3 kg] 98.3 kg (10/27 0320)   Intake/Output from previous day: 10/26 0701 - 10/27 0700 In: 255 [NG/GT:195] Out: 40 [Drains:20; Chest Tube:20]   Physical Exam:  Cardiovascular: RRR Pulmonary: Clear to auscultation Abdomen: Soft, no tenderness, bowel sounds present. J-tube site dressing is dry.   Extremities: warm, well perfused Wounds: Port incisions are open to air and are intact and dry. J-tube dressing has a small area of staining Chest Tube:to suction, minimal drainage. Minimal drainage from left pleural JP.    Lab Results: CBC: Recent Labs    03/01/20 0437  WBC 9.9  HGB 9.4*  HCT 28.7*  PLT 161   BMET:  Recent Labs    02/29/20 0430 03/01/20 0437  NA 144 139  K 3.4* 3.5  CL 109 105  CO2 27 26  GLUCOSE 115* 123*  BUN 20 19  CREATININE 0.65 0.65  CALCIUM 8.2* 8.3*    PT/INR:  No results for input(s): LABPROT, INR in the last 72 hours. ABG:  INR: Will add last result for INR, ABG once components are confirmed Will add last 4 CBG results once components are confirmed  Assessment/Plan:  1. CV - Stable SR. 2.  Pulmonary - now on RA, sats OK. Chest tube and J-tube  with 20 cc each last 24 hours; CXR shows small bilateral  pleural effusions/atelectasis Encouraging incentive spirometer, ambulation. 3. GI-NPO. Tolerating tube feedings at 65 ml/hr and Prosouce. Swallow study yesterday showed no leaks and free flow of contrast through the pylorus.  Appreciate SLP consult, MBS planned for today. Hope to start clear liquids po soon if appropriate.  4. CBGs- control remains adequate. No history of diabetes but will continue to monitor as on tube feedings. 6. Anemia-H and H has been stable 7. Mild thrombocytopenia-resolved 8. Continue PT   Joline Maxcy [phone] 03/01/2020,8:41 AM  Agree with above Clear for swallowing Will start on clear liquids Will transition to nocturnal feeds  Jamaury Gumz O Vinnie Bobst

## 2020-03-01 NOTE — Progress Notes (Addendum)
Modified Barium Swallow Progress Note  Patient Details  Name: Brett Dawson MRN: [id number] Date of Birth: May 18, 1955  Today's Date: 03/01/2020  Modified Barium Swallow completed.  Full report located under Chart Review in the Imaging Section.  Brief recommendations include the following:  Clinical Impression  Pt presents with mild pharyngeal dysphagia which is likely his baseline and did not impact safety. He exhibited reduced lingual retraction which resulted in vallecular residue with mechanical soft solids and regular textures. Pt independently demonstrated 1-2 dry secondary swallows which cleared residue. Transient penetration (PAS 2) of thin liquids was observed with the 13-mm barium tablet, but this is considered to be WNL. Multiple esophageal sweeps were conducted and pt's anatomical changes did not appear to impact the pharyngeal phase of the swallow. Pt may have up to regular texture solids and thin liquids from an oropharyngeal standpoint. However, per surgery's recommendation, he will be started on clear liquids at this time and further diet advancement will be deferred to surgery. SLP will follow briefly to ensure diet tolerance.    Swallow Evaluation Recommendations      SLP Diet Recommendations: Thin liquid (clear liquids with advancement to regular per surgery's discretion)   Liquid Administration via: Cup;Straw;Spoon   Medication Administration: Via alternative means (may be given p.o. if surgery is in agreement)   Supervision: Patient able to self feed   Compensations: Slow rate   Postural Changes: Remain semi-upright after after feeds/meals (Comment);Seated upright at 90 degrees   Oral Care Recommendations: Oral care BID       Brett Dawson, [place], [place] Office number [phone] Pager Nathalie 03/01/2020,10:29 AM

## 2020-03-01 NOTE — Progress Notes (Signed)
Nutrition Follow-up  RD working remotely.  DOCUMENTATION CODES:   Non-severe (moderate) malnutrition in context of chronic illness  INTERVENTION:   Transition to nocturnal tube feeds via J-tube: - Osmolite 1.5 @ 95 ml/hr x 16 hours from 1800 to 1000 (total of 1520 ml) - Free water flushes of 100 ml q 4 hours  Nocturnal tube feeding regimen provides 2280kcal (97% of kcal needs), 95grams of protein (86% of protein needs), and 1112ml of H2O.  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  Ongoing  GOAL:   Patient will meet greater than or equal to 90% of their needs  Progressing  MONITOR:   Diet advancement, Labs, Weight trends, Skin, I & O's  REASON FOR ASSESSMENT:   Consult Enteral/tube feeding initiation and management  ASSESSMENT:   64 year old male who presented on 10/21 for Ivor Lewis esophagectomy and J-tube placement. 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  10/22 - TF initiated 10/26 - swallow study, no evidence of leak, NGT removed 10/27 - MBS, diet advanced to clears  Discussed pt with MD. Plan to transition pt to nocturnal tube feeds over 16 hours since diet has been advanced to clears. Discussed with RN. RD will order clear liquid oral nutrition supplements.  RD was unable to reach pt via phone call to room to discuss plan to transition to nocturnal tube feeds. RN aware of plan.  Admit weight: 99.6 kg Current weight: 98.3 kg  Current TF regimen: Osmolite 1.5 @ 65 ml/hr, ProSource TF 45 ml BID, free water  flushes of 150 ml q 4 hours  Medications reviewed and include: SSI q 4 hours, IV protonix  Labs reviewed: hemoglobin 9.4 CBG's: 92-130 x 24 hours  RUQ JP drain: 20 ml x 24 hours CT: 20 ml x 24 hours I/O's: +1.2 L since admit  Diet Order:   Diet Order            Diet clear liquid Room service appropriate? Yes with Assist; Fluid consistency: Thin  Diet effective now                 EDUCATION NEEDS:   Education needs have been addressed  Skin:  Skin Assessment: Skin Integrity Issues: Incisions: chest, adomen  Last BM:  02/29/20  Height:   Ht Readings from Last 1 Encounters:  02/24/20 5\' 11"  (1.803 m)    Weight:   Wt Readings from Last 1 Encounters:  03/01/20 98.3 kg    Ideal Body Weight:  78.2 kg  BMI:  Body mass index is 30.23 kg/m.  Estimated Nutritional Needs:   Kcal:  3875-6433  Protein:  110-130 grams  Fluid:  >/= 2.0 L    Gaynell Face, MS, RD, LDN Inpatient Clinical Dietitian Please see AMiON for contact information.

## 2020-03-02 LAB — GLUCOSE, CAPILLARY
Glucose-Capillary: 120 mg/dL — ABNORMAL HIGH (ref 70–99)
Glucose-Capillary: 128 mg/dL — ABNORMAL HIGH (ref 70–99)
Glucose-Capillary: 115 mg/dL — ABNORMAL HIGH (ref 70–99)
Glucose-Capillary: 107 mg/dL — ABNORMAL HIGH (ref 70–99)
Glucose-Capillary: 138 mg/dL — ABNORMAL HIGH (ref 70–99)
Glucose-Capillary: 132 mg/dL — ABNORMAL HIGH (ref 70–99)

## 2020-03-02 LAB — CBC
HCT: 31.2 % — ABNORMAL LOW (ref 39.0–52.0)
Hemoglobin: 10.2 g/dL — ABNORMAL LOW (ref 13.0–17.0)
MCH: 30.1 pg (ref 26.0–34.0)
MCHC: 32.7 g/dL (ref 30.0–36.0)
MCV: 92 fL (ref 80.0–100.0)
Platelets: 207 10*3/uL (ref 150–400)
RBC: 3.39 MIL/uL — ABNORMAL LOW (ref 4.22–5.81)
RDW: 19.2 % — ABNORMAL HIGH (ref 11.5–15.5)
WBC: 12.5 10*3/uL — ABNORMAL HIGH (ref 4.0–10.5)
nRBC: 0 % (ref 0.0–0.2)

## 2020-03-02 NOTE — Progress Notes (Signed)
Physical Therapy Treatment Patient Details Name: Brett Dawson MRN: [id number] DOB: 1956-03-13 Today's Date: 03/02/2020    History of Present Illness 64 yo male with Esophageal cancer. s/p 02/24/20 robotic assisted Ivor-Lewis esophagectomy with jejunostomy tube placement    PT Comments    Pt supine in bed on arrival this session.  Pt required encouragement to participate in PT session this am.  Pt with decreased activity tolerance this session and increased work of breathing.  Pt left sitting on commode to have BM with instruction to pull cord when finished.    Follow Up Recommendations  Home health PT;Supervision - Intermittent     Equipment Recommendations  None recommended by PT    Recommendations for Other Services       Precautions / Restrictions Precautions Precautions: Other (comment) Precaution Comments: JP drain    Mobility  Bed Mobility Overal bed mobility: Modified Independent             General bed mobility comments: OOB on entry   Transfers Overall transfer level: Needs assistance Equipment used: None Transfers: Sit to/from Stand Sit to Stand: Min guard         General transfer comment: Cues for hand placement as he tends to reach for RW to pull into standing.  Ambulation/Gait Ambulation/Gait assistance: Min guard Gait Distance (Feet): 120 Feet Assistive device: Rolling walker (2 wheeled) Gait Pattern/deviations: Step-through pattern;Shuffle;Narrow base of support Gait velocity: slowed   General Gait Details: Min guard assistance.  DOE 3/4 SPO2 95%.  Decreased distance this pm likely due to need to have BM and get back to room.   Stairs             Wheelchair Mobility    Modified Rankin (Stroke Patients Only)       Balance Overall balance assessment: Needs assistance Sitting-balance support: Feet supported;No upper extremity supported Sitting balance-Leahy Scale: Normal       Standing balance-Leahy Scale: Fair                               Cognition Arousal/Alertness: Awake/alert Behavior During Therapy: WFL for tasks assessed/performed Overall Cognitive Status: Within Functional Limits for tasks assessed                                        Exercises      General Comments        Pertinent Vitals/Pain Pain Assessment: Faces Faces Pain Scale: Hurts even more Pain Location: chronic back pain Pain Descriptors / Indicators: Discomfort Pain Intervention(s): Monitored during session;Repositioned    Home Living                      Prior Function            PT Goals (current goals can now be found in the care plan section) Acute Rehab PT Goals Potential to Achieve Goals: Good Progress towards PT goals: Progressing toward goals    Frequency    Min 3X/week      PT Plan Current plan remains appropriate    Co-evaluation              AM-PAC PT "6 Clicks" Mobility   Outcome Measure  Help needed turning from your back to your side while in a flat bed without using bedrails?: None Help needed moving from  lying on your back to sitting on the side of a flat bed without using bedrails?: A Little Help needed moving to and from a bed to a chair (including a wheelchair)?: A Little Help needed standing up from a chair using your arms (e.g., wheelchair or bedside chair)?: A Little Help needed to walk in hospital room?: A Little Help needed climbing 3-5 steps with a railing? : A Little 6 Click Score: 19    End of Session Equipment Utilized During Treatment: Gait belt Activity Tolerance: Patient tolerated treatment well Patient left: in chair;with call bell/phone within reach;Other (comment) Nurse Communication: Mobility status PT Visit Diagnosis: Other abnormalities of gait and mobility (R26.89)     Time: 1205-1223 PT Time Calculation (min) (ACUTE ONLY): 18 min  Charges:  $Gait Training: 8-22 mins                     Bonney Leitz , PTA Acute  Rehabilitation Services Pager [phone] Office [phone]     Maisee Vollman Artis Delay 03/02/2020, 12:32 PM

## 2020-03-02 NOTE — Plan of Care (Signed)

## 2020-03-02 NOTE — Progress Notes (Signed)
Speech Language Pathology Treatment: Dysphagia  Patient Details Name: Brett Dawson MRN: [id number] DOB: 11-14-1955 Today's Date: 03/02/2020 Time: 4132-4401 SLP Time Calculation (min) (ACUTE ONLY): 10 min  Assessment / Plan / Recommendation Clinical Impression  Pt was seen for dysphagia treatment and was cooperative throughout the session. Nursing reported that the pt has been tolerating the current diet without overt s/sx of aspiration. Pt stated that he has been "coughing up phlegm" but denied any signs of aspiration with p.o. intake. Pt tolerated clear liquids (i.e., thin liquids via straw and jello) without symptoms of oropharyngeal dysphagia. It is recommended that the current diet be continued with further advancement per surgery's recommendation. Further skilled SLP services are not clinically indicated at this time.    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 Per EMR, he had lost approximately 60 pounds after diagnosis, but did not have much dysphagia. He presented on 10/21 for Ivor Lewis esophagectomy and J-tube placement.       SLP Plan  Discharge SLP treatment due to (comment);All goals met       Recommendations  Diet recommendations: Thin liquid (continue clear liquids) Liquids provided via: Straw;Cup;Teaspoon Medication Administration: Via alternative means Supervision: Staff to assist with self feeding Compensations: Slow rate                Oral Care Recommendations: Oral care BID Follow up Recommendations: None SLP Visit Diagnosis: Dysphagia, pharyngeal phase (R13.13) Plan: Discharge SLP treatment due to (comment);All goals met       Brett Dawson I. Brett Clock, MS, CCC-SLP Acute Rehabilitation Services Office number [phone] Pager [phone]                Brett Dawson 03/02/2020, 11:50  AM

## 2020-03-02 NOTE — Progress Notes (Addendum)
      [address]       [place] [address]             [phone]       7 Days Post-Op Procedure(s) (LRB): XI ROBOTIC ASSISTED IVOR LEWIS ESOPHAGECTOMY (N/A) LAPARSCOPIC  JEJUNOSTOMY TUBE PLACEMENT (N/A) ESOPHAGOGASTRODUODENOSCOPY (EGD) (N/A) INTERCOSTAL NERVE BLOCK (Right)  Subjective: Resting in bed, started clear liquids yesterday and is tolerating this well.   Denies nausea.  TF transitioned to nocturnal infusion. Having some soreness at J-tube exit site.   Objective: Vital signs in last 24 hours: Temp:  [98.5 F (36.9 C)-102.2 F (39 C)] 99.7 F (37.6 C) (10/28 0352) Pulse Rate:  [74-93] 84 (10/28 0352) Cardiac Rhythm: Normal sinus rhythm (10/28 0701) Resp:  [15-18] 18 (10/28 0352) BP: (99-131)/(58-82) 123/61 (10/28 0352) SpO2:  [92 %-96 %] 93 % (10/28 0352) Weight:  [98.9 kg] 98.9 kg (10/28 0352)   Intake/Output from previous day: 10/27 0701 - 10/28 0700 In: 2241.8 [P.O.:240; NG/GT:2001.8] Out: 500 [Urine:450; Drains:20; Chest Tube:30]   Physical Exam:  Cardiovascular: RRR Pulmonary: Clear to auscultation Abdomen: Soft, no tenderness, bowel sounds present. J-tube site dressing was saturated so it was removed. Skin surrounding exit site is erythematous with foul smell. Wound was cleaned with saline and new dressing applied.  Extremities: warm, well perfused Wounds: Port incisions are open to air and are intact and dry.  Chest Tube: to suction, minimal drainage. Minimal drainage from left pleural JP.    Lab Results: CBC: Recent Labs    03/01/20 0437  WBC 9.9  HGB 9.4*  HCT 28.7*  PLT 161   BMET:  Recent Labs    02/29/20 0430 03/01/20 0437  NA 144 139  K 3.4* 3.5  CL 109 105  CO2 27 26  GLUCOSE 115* 123*  BUN 20 19  CREATININE 0.65 0.65  CALCIUM 8.2* 8.3*    PT/INR:  No results for input(s): LABPROT, INR in the last 72 hours. ABG:  INR: Will add last result for INR, ABG once components are confirmed Will add last 4  CBG results once components are confirmed  Assessment/Plan:  1. CV - Stable SR. 2.  Pulmonary - now on RA, sats OK. Plan to remove the chest tube and leave J-tube  in place. 3. GI-swallow study showed no evidence of leak or obstruction. Now tolerating clear liquids PO.  No plans to advance diet beyond clear liquids during this admission.  4. CBGs- control remains adequate. No history of diabetes but will continue to monitor as on tube feedings. 5. Fever- Tm 102- appears to be developing some cellulitis at J-tube exit site. Will discus adding ABX with Dr. Kipp Brood.  6. Anemia-H and H has been stable 7. Mild thrombocytopenia-resolved 8. Continue PT   Joline Maxcy [phone] 03/02/2020,8:05 AM   Febrile overnight Otherwise doing well. Chest tube removed Tolerating clears We'll keep Blake drain in for now We'll cycle tube feeds at night. Likely discharge on Monday.  Yamina Lenis [name]

## 2020-03-03 ENCOUNTER — Inpatient Hospital Stay (HOSPITAL_COMMUNITY): Payer: 59

## 2020-03-03 LAB — CBC
HCT: 32.4 % — ABNORMAL LOW (ref 39.0–52.0)
Hemoglobin: 10.5 g/dL — ABNORMAL LOW (ref 13.0–17.0)
MCH: 30 pg (ref 26.0–34.0)
MCHC: 32.4 g/dL (ref 30.0–36.0)
MCV: 92.6 fL (ref 80.0–100.0)
Platelets: 237 10*3/uL (ref 150–400)
RBC: 3.5 MIL/uL — ABNORMAL LOW (ref 4.22–5.81)
RDW: 18.8 % — ABNORMAL HIGH (ref 11.5–15.5)
WBC: 12.9 10*3/uL — ABNORMAL HIGH (ref 4.0–10.5)
nRBC: 0 % (ref 0.0–0.2)

## 2020-03-03 LAB — GLUCOSE, CAPILLARY
Glucose-Capillary: 116 mg/dL — ABNORMAL HIGH (ref 70–99)
Glucose-Capillary: 123 mg/dL — ABNORMAL HIGH (ref 70–99)
Glucose-Capillary: 136 mg/dL — ABNORMAL HIGH (ref 70–99)
Glucose-Capillary: 147 mg/dL — ABNORMAL HIGH (ref 70–99)
Glucose-Capillary: 161 mg/dL — ABNORMAL HIGH (ref 70–99)

## 2020-03-03 LAB — BASIC METABOLIC PANEL
Anion gap: 8 (ref 5–15)
BUN: 13 mg/dL (ref 8–23)
CO2: 26 mmol/L (ref 22–32)
Calcium: 8 mg/dL — ABNORMAL LOW (ref 8.9–10.3)
Chloride: 99 mmol/L (ref 98–111)
Creatinine, Ser: 0.64 mg/dL (ref 0.61–1.24)
GFR, Estimated: >60 mL/min (ref >60)
Glucose, Bld: 168 mg/dL — ABNORMAL HIGH (ref 70–99)
Potassium: 4.4 mmol/L (ref 3.5–5.1)
Sodium: 133 mmol/L — ABNORMAL LOW (ref 135–145)

## 2020-03-03 NOTE — Progress Notes (Addendum)
Occupational Therapy Treatment Patient Details Name: Brett Dawson MRN: [id number] DOB: 1956/01/20 Today's Date: 03/03/2020    History of present illness 64 yo male with Esophageal cancer. s/p 02/24/20 robotic assisted Ivor-Lewis esophagectomy with jejunostomy tube placement   OT comments  Pt states he does not normally get up until 10 a.m. as he goes to work at noon typically. Pt on 3L 02 with Sp02 of 93%. Reports he feels short of breath. Pt used BSC and groomed at sink with supervision to min guard assist. Pushed feeding tube pole in room. Pt chose to return to bed at end of session. Encouraged use of IS. Pt is eager to shower.   Follow Up Recommendations  No OT follow up    Equipment Recommendations  None recommended by OT    Recommendations for Other Services      Precautions / Restrictions Precautions Precautions: Other (comment) Precaution Comments: g tube, JP drain       Mobility Bed Mobility Overal bed mobility: Modified Independent                Transfers Overall transfer level: Needs assistance Equipment used: None Transfers: Sit to/from Stand Sit to Stand: Supervision         General transfer comment: pt pushed feeding tube pole in room    Balance Overall balance assessment: Needs assistance Sitting-balance support: Feet supported;No upper extremity supported Sitting balance-Leahy Scale: Normal       Standing balance-Leahy Scale: Fair                             ADL either performed or assessed with clinical judgement   ADL Overall ADL's : Needs assistance/impaired     Grooming: Standing;Supervision/safety               Lower Body Dressing: Set up;Sitting/lateral leans Lower Body Dressing Details (indicate cue type and reason): socks Toilet Transfer: Minimal assistance;Ambulation   Toileting- Clothing Manipulation and Hygiene: Sitting/lateral lean;Set up       Functional mobility during ADLs: Min guard (pushed  feeding tube pole) General ADL Comments: pt on 3L 02 with Sp02 of 93%     Vision       Perception     Praxis      Cognition Arousal/Alertness: Awake/alert Behavior During Therapy: WFL for tasks assessed/performed Overall Cognitive Status: Within Functional Limits for tasks assessed                                          Exercises     Shoulder Instructions       General Comments      Pertinent Vitals/ Pain       Pain Assessment: Faces Faces Pain Scale: Hurts little more Pain Location: chronic back pain Pain Descriptors / Indicators: Discomfort Pain Intervention(s): Monitored during session;Repositioned  Home Living                                          Prior Functioning/Environment              Frequency  Min 2X/week        Progress Toward Goals  OT Goals(current goals can now be found in the care plan section)  Progress towards  OT goals: Progressing toward goals  Acute Rehab OT Goals Patient Stated Goal: get back to work OT Goal Formulation: With patient Time For Goal Achievement: 03/15/20 Potential to Achieve Goals: Good  Plan Discharge plan remains appropriate    Co-evaluation                 AM-PAC OT "6 Clicks" Daily Activity     Outcome Measure   Help from another person eating meals?: None Help from another person taking care of personal grooming?: A Little Help from another person toileting, which includes using toliet, bedpan, or urinal?: A Little Help from another person bathing (including washing, rinsing, drying)?: A Little Help from another person to put on and taking off regular upper body clothing?: None Help from another person to put on and taking off regular lower body clothing?: A Little 6 Click Score: 20    End of Session Equipment Utilized During Treatment: Gait belt  OT Visit Diagnosis: Muscle weakness (generalized) (M62.81)   Activity Tolerance Patient limited by  fatigue   Patient Left in bed;with call bell/phone within reach   Nurse Communication          Time: 0177-9390 OT Time Calculation (min): 15 min  Charges: OT General Charges $OT Visit: 1 Visit OT Treatments $Self Care/Home Management : 8-22 mins  Nestor Lewandowsky, OTR/L Acute Rehabilitation Services Pager: [phone] Office: [phone]  Malka So 03/03/2020, 9:49 AM

## 2020-03-03 NOTE — Progress Notes (Addendum)
      [address]       [place] [address]             [phone]       8 Days Post-Op Procedure(s) (LRB): XI ROBOTIC ASSISTED IVOR LEWIS ESOPHAGECTOMY (N/A) LAPARSCOPIC  JEJUNOSTOMY TUBE PLACEMENT (N/A) ESOPHAGOGASTRODUODENOSCOPY (EGD) (N/A) INTERCOSTAL NERVE BLOCK (Right)  Subjective: Resting in bed, continues to tolerate PO clear liquids well.  Denies nausea.  Nocturnal TF infusing per J-tube at 22ml/hr.  Continues to have some soreness at J-tube exit site and generalized abdominal pain with coughing.   Objective: Vital signs in last 24 hours: Temp:  [98.5 F (36.9 C)-100.5 F (38.1 C)] 98.5 F (36.9 C) (10/29 0753) Pulse Rate:  [71-91] 91 (10/29 0753) Cardiac Rhythm: Normal sinus rhythm (10/28 2100) Resp:  [17-20] 20 (10/29 0357) BP: (117-132)/(58-70) 117/65 (10/29 0753) SpO2:  [90 %-95 %] 95 % (10/29 0357) Weight:  [95.6 kg] 95.6 kg (10/29 0505)   Intake/Output from previous day: 10/28 0701 - 10/29 0700 In: 2799.8 [P.O.:477; NG/GT:2322.8] Out: 1085 [Urine:1075; Drains:10]   Physical Exam:  Cardiovascular: RRR Pulmonary: Clear to auscultation Abdomen: Soft, mild tenderness at J-tube exit site. Bowel sounds present.  Skin surrounding exit site is erythematous with foul smell. Extremities: warm, well perfused Wounds: Port incisions are open to air and are intact and dry.  Chest Tube: Argyle tube removed yesterday, site is dry. Minimal drainage from left pleural JP.    Lab Results: CBC: Recent Labs    03/01/20 0437 03/02/20 0824  WBC 9.9 12.5*  HGB 9.4* 10.2*  HCT 28.7* 31.2*  PLT 161 207   BMET:  Recent Labs    03/01/20 0437  NA 139  K 3.5  CL 105  CO2 26  GLUCOSE 123*  BUN 19  CREATININE 0.65  CALCIUM 8.3*    PT/INR:  No results for input(s): LABPROT, INR in the last 72 hours. ABG:  INR: Will add last result for INR, ABG once components are confirmed Will add last 4 CBG results once components are  confirmed  Assessment/Plan:  1. CV - Stable SR. 2.  Pulmonary - on 3Lnc O2 sats OK. Plan to leave J-tube  in place for now. 3. GI-swallow study on 10/26 showed no evidence of leak or obstruction. Now tolerating clear liquids PO.  No plans to advance diet beyond clear liquids during this admission.  4. CBGs- control remains adequate. No history of diabetes but will continue to monitor as on tube feedings. 5. Fever- Tm 100.5 past 24 hours- appears to be developing some cellulitis at J-tube exit site. Will discus adding ABX with Dr. Kipp Brood.  6. Anemia-H and H trending up, monitor 7. Mild thrombocytopenia-resolved 8. Continue PT   Arlis Porta RoddenberryPA-C [phone] 03/03/2020,8:46 AM   Agree with above. Tolerating clears and cycle tube feeds We will start Ancef for cellulitis. Minimal drain output but will keep, and remove in clinic Likely discharge on Monday  Riley

## 2020-03-03 NOTE — Progress Notes (Signed)
Physical Therapy Treatment Patient Details Name: Brett Dawson MRN: [id number] DOB: [birth year] Today's Date: 03/03/2020    History of Present Illness 64 yo male with Esophageal cancer. s/p 02/24/20 robotic assisted Ivor-Lewis esophagectomy with jejunostomy tube placement    PT Comments    Pt seated in recliner chair.  He refused OOB this session reports he has had difficulty breathing today.  He sounds congested.  Encouraged him to blow nose.  Pt participated in B LE strengthening and posture exercises to reduce pain in thoracic region of spine and increased LE strength.  Plan remains for HHPT, plan next session for stair training.     Follow Up Recommendations  Home health PT;Supervision - Intermittent     Equipment Recommendations  None recommended by PT    Recommendations for Other Services       Precautions / Restrictions Precautions Precautions: Other (comment) Precaution Comments: g tube, JP drain Restrictions Weight Bearing Restrictions: No    Mobility  Bed Mobility               General bed mobility comments: seated in recliner- refused OOB wanting to ambulate later then get back to bed. Focused on LE strengthening and postural exercises.  Transfers                    Ambulation/Gait                 Stairs             Wheelchair Mobility    Modified Rankin (Stroke Patients Only)       Balance                                            Cognition Arousal/Alertness: Awake/alert Behavior During Therapy: WFL for tasks assessed/performed Overall Cognitive Status: Within Functional Limits for tasks assessed                                        Exercises General Exercises - Lower Extremity Ankle Circles/Pumps: AROM;Both;Seated;20 reps Quad Sets: AROM;Both;10 reps;Supine Long Arc Quad: AROM;Both;10 reps;Seated Hip ABduction/ADduction: AROM;Both;10 reps;Supine Hip Flexion/Marching:  AROM;Both;10 reps;Seated Other Exercises Other Exercises: Cervical flexion and extension 1x10 reps. Other Exercises: B shoulder elevation 1x10 reps. Other Exercises: scap retraction 1x10 reps. Other Exercises: B shoulder circles ( backward ) 1x10 reps.    General Comments        Pertinent Vitals/Pain Pain Assessment: Faces Faces Pain Scale: Hurts little more Pain Location: chronic back pain Pain Descriptors / Indicators: Discomfort Pain Intervention(s): Monitored during session;Repositioned    Home Living                      Prior Function            PT Goals (current goals can now be found in the care plan section) Acute Rehab PT Goals Patient Stated Goal: get back to work Potential to Achieve Goals: Good Progress towards PT goals: Progressing toward goals    Frequency    Min 3X/week      PT Plan Current plan remains appropriate    Co-evaluation              AM-PAC PT "6 Clicks" Mobility   Outcome Measure  Help  needed turning from your back to your side while in a flat bed without using bedrails?: None Help needed moving from lying on your back to sitting on the side of a flat bed without using bedrails?: A Little Help needed moving to and from a bed to a chair (including a wheelchair)?: A Little Help needed standing up from a chair using your arms (e.g., wheelchair or bedside chair)?: A Little Help needed to walk in hospital room?: A Little Help needed climbing 3-5 steps with a railing? : A Little 6 Click Score: 19    End of Session   Activity Tolerance: Patient tolerated treatment well Patient left: in chair;with call bell/phone within reach Nurse Communication: Mobility status (need for humidifier for O2 blowing clots from nose when blowing nose.) PT Visit Diagnosis: Other abnormalities of gait and mobility (R26.89)     Time: 8841-6606 PT Time Calculation (min) (ACUTE ONLY): 22 min  Charges:  $Therapeutic Exercise: 8-22 mins                      Brett Dawson , PTA Acute Rehabilitation Services Pager [phone] Office [phone]     Brett Dawson Artis Delay 03/03/2020, 4:41 PM

## 2020-03-04 LAB — GLUCOSE, CAPILLARY
Glucose-Capillary: 117 mg/dL — ABNORMAL HIGH (ref 70–99)
Glucose-Capillary: 129 mg/dL — ABNORMAL HIGH (ref 70–99)
Glucose-Capillary: 133 mg/dL — ABNORMAL HIGH (ref 70–99)
Glucose-Capillary: 144 mg/dL — ABNORMAL HIGH (ref 70–99)
Glucose-Capillary: 150 mg/dL — ABNORMAL HIGH (ref 70–99)
Glucose-Capillary: 92 mg/dL (ref 70–99)

## 2020-03-04 LAB — CBC
HCT: 30.8 % — ABNORMAL LOW (ref 39.0–52.0)
Hemoglobin: 9.9 g/dL — ABNORMAL LOW (ref 13.0–17.0)
MCH: 29.7 pg (ref 26.0–34.0)
MCHC: 32.1 g/dL (ref 30.0–36.0)
MCV: 92.5 fL (ref 80.0–100.0)
Platelets: 257 10*3/uL (ref 150–400)
RBC: 3.33 MIL/uL — ABNORMAL LOW (ref 4.22–5.81)
RDW: 18.6 % — ABNORMAL HIGH (ref 11.5–15.5)
WBC: 13.8 10*3/uL — ABNORMAL HIGH (ref 4.0–10.5)
nRBC: 0 % (ref 0.0–0.2)

## 2020-03-04 MED ORDER — CEFAZOLIN SODIUM-DEXTROSE 1-4 GM/50ML-% IV SOLN
1.0000 g | Freq: Three times a day (TID) | INTRAVENOUS | Status: DC
  Administered 2020-03-04 – 2020-03-05 (×3): 1 g via INTRAVENOUS
  Filled 2020-03-04 (×4): qty 50

## 2020-03-04 MED ORDER — SILVER SULFADIAZINE 1 % EX CREA
TOPICAL_CREAM | Freq: Two times a day (BID) | CUTANEOUS | Status: DC
  Administered 2020-03-04 – 2020-04-20 (×7): 1 via TOPICAL
  Filled 2020-03-04 (×2): qty 85

## 2020-03-04 MED ORDER — GUAIFENESIN-DM 100-10 MG/5ML PO SYRP
5.0000 mL | ORAL_SOLUTION | Freq: Four times a day (QID) | ORAL | Status: DC | PRN
  Administered 2020-03-04 – 2020-03-15 (×34): 5 mL
  Filled 2020-03-04 (×34): qty 5

## 2020-03-04 NOTE — Progress Notes (Addendum)
Dressing change at J-Tube site, old dressing with yellow drainage. Site is red and painful per patient.  Night shift RN put in wound consult.

## 2020-03-04 NOTE — Progress Notes (Signed)
Dressing changed again at J-tube site, old dressing with yellow drainage. Site is red and painful per patient.

## 2020-03-04 NOTE — Progress Notes (Addendum)
[address]       [place] [address]             [phone]      9 Days Post-Op Procedure(s) (LRB): XI ROBOTIC ASSISTED IVOR LEWIS ESOPHAGECTOMY (N/A) LAPARSCOPIC  JEJUNOSTOMY TUBE PLACEMENT (N/A) ESOPHAGOGASTRODUODENOSCOPY (EGD) (N/A) INTERCOSTAL NERVE BLOCK (Right) Subjective: C/o some pulm congestion/cough  Objective: Vital signs in last 24 hours: Temp:  [98.4 F (36.9 C)-103.1 F (39.5 C)] 99.4 F (37.4 C) (10/30 0714) Pulse Rate:  [79-102] 84 (10/30 0714) Cardiac Rhythm: Normal sinus rhythm (10/30 0700) Resp:  [18-20] 18 (10/30 0411) BP: (102-120)/(59-67) 120/59 (10/30 0714) SpO2:  [92 %-96 %] 94 % (10/30 0714) Weight:  [95.4 kg] 95.4 kg (10/30 7169)  Hemodynamic parameters for last 24 hours:    Intake/Output from previous day: 10/29 0701 - 10/30 0700 In: 1830 [P.O.:120; NG/GT:1710] Out: 585 [Urine:575; Drains:10] Intake/Output this shift: Total I/O In: -  Out: 300 [Urine:300]  General appearance: alert, cooperative and no distress Heart: regular rate and rhythm Lungs: some coarseness and ronchi on right Abdomen: soft, non tender Extremities: no edema or calf tenderness Wound: minor erethema and some J tube site tenderness, no obvious purulence  Lab Results: Recent Labs    03/03/20 0941 03/04/20 0216  WBC 12.9* 13.8*  HGB 10.5* 9.9*  HCT 32.4* 30.8*  PLT 237 257   BMET:  Recent Labs    03/03/20 0941  NA 133*  K 4.4  CL 99  CO2 26  GLUCOSE 168*  BUN 13  CREATININE 0.64  CALCIUM 8.0*    PT/INR: No results for input(s): LABPROT, INR in the last 72 hours. ABG    Component Value Date/Time   PHART 7.454 (H) 02/25/2020 0500   HCO3 26.5 02/25/2020 0500   TCO2 27 02/24/2020 1944   ACIDBASEDEF 1.0 02/24/2020 1547   O2SAT 93.7 02/25/2020 0500   CBG (last 3)  Recent Labs    03/03/20 2330 03/04/20 0410 03/04/20 0752  GLUCAP 123* 150* 144*    Meds Scheduled Meds: . Chlorhexidine Gluconate Cloth  6 each Topical  Daily  . feeding supplement  1 Container Oral TID BM  . feeding supplement (OSMOLITE 1.5 CAL)  1,520 mL Per Tube Q24H  . free water  100 mL Per Tube Q4H  . insulin aspart  0-24 Units Subcutaneous Q4H  . pantoprazole (PROTONIX) IV  40 mg Intravenous Q12H  . sorbitol  30 mL Per Tube Once   Continuous Infusions: PRN Meds:.acetaminophen, guaiFENesin-dextromethorphan, morphine injection, ondansetron (ZOFRAN) IV  Xrays DG CHEST PORT 1 VIEW  Result Date: 03/03/2020 CLINICAL DATA:  Cough. EXAM: PORTABLE CHEST 1 VIEW COMPARISON:  Chest x-ray 02/29/2020. FINDINGS: Interval removal of right IJ line. Interval removal of 1 of the 2 chest tubes. Remaining chest tube in stable position. No pneumothorax. Heart size normal. Progressive left pleural effusion. Underlying infiltrate most likely present. Stable small right pleural effusion and right base atelectasis. IMPRESSION: 1. Interval removal of right IJ line. Interval removal of 1 of the 2 chest tubes. Remaining chest tube in stable position. No pneumothorax. 2. Progressive left pleural effusion. Underlying infiltrate most likely present. Stable small right pleural effusion and right base atelectasis. Electronically Signed   By: Marcello Moores  Register   On: 03/03/2020 08:14    Assessment/Plan: S/P Procedure(s) (LRB): XI ROBOTIC ASSISTED IVOR LEWIS ESOPHAGECTOMY (N/A) LAPARSCOPIC  JEJUNOSTOMY TUBE PLACEMENT (N/A) ESOPHAGOGASTRODUODENOSCOPY (EGD) (N/A) INTERCOSTAL NERVE BLOCK (Right)  1 conts to make good overall progress 2  VSS tmax 103.1- will add ancef per Dr Abran Duke note for J tube site cellulitis. WBC trending up, CXR from yesterday show left effus/poss underlying infiltrate, also added cough RX. Will repeat CXR in am 3 sats good on 3 liters 4 repeat labs in am 5 tol diet/TF's 6 push rehab and pum toilet as able  LOS: 9 days    John Giovanni PA-C Pager [phone] 03/04/2020  Silvadene ointment to be applied to J-tube exit site for erythema  and cellulitis  patient examined and medical record reviewed,agree with above note. Tharon Aquas Trigt III 03/04/2020

## 2020-03-04 NOTE — Plan of Care (Signed)

## 2020-03-04 NOTE — Progress Notes (Signed)
Dressing changed at J-tube site, Silvadene Cream applied around J-Tube site, new dressing applied.

## 2020-03-05 ENCOUNTER — Inpatient Hospital Stay (HOSPITAL_COMMUNITY): Payer: 59

## 2020-03-05 LAB — BASIC METABOLIC PANEL
Anion gap: 9 (ref 5–15)
BUN: 14 mg/dL (ref 8–23)
CO2: 25 mmol/L (ref 22–32)
Calcium: 8 mg/dL — ABNORMAL LOW (ref 8.9–10.3)
Chloride: 101 mmol/L (ref 98–111)
Creatinine, Ser: 0.72 mg/dL (ref 0.61–1.24)
GFR, Estimated: >60 mL/min (ref >60)
Glucose, Bld: 113 mg/dL — ABNORMAL HIGH (ref 70–99)
Potassium: 3.9 mmol/L (ref 3.5–5.1)
Sodium: 135 mmol/L (ref 135–145)

## 2020-03-05 LAB — CBC
HCT: 29.9 % — ABNORMAL LOW (ref 39.0–52.0)
Hemoglobin: 9.8 g/dL — ABNORMAL LOW (ref 13.0–17.0)
MCH: 30.2 pg (ref 26.0–34.0)
MCHC: 32.8 g/dL (ref 30.0–36.0)
MCV: 92.3 fL (ref 80.0–100.0)
Platelets: 285 10*3/uL (ref 150–400)
RBC: 3.24 MIL/uL — ABNORMAL LOW (ref 4.22–5.81)
RDW: 18.2 % — ABNORMAL HIGH (ref 11.5–15.5)
WBC: 15.8 10*3/uL — ABNORMAL HIGH (ref 4.0–10.5)
nRBC: 0 % (ref 0.0–0.2)

## 2020-03-05 LAB — RESPIRATORY PANEL BY PCR

## 2020-03-05 LAB — GLUCOSE, CAPILLARY
Glucose-Capillary: 131 mg/dL — ABNORMAL HIGH (ref 70–99)
Glucose-Capillary: 131 mg/dL — ABNORMAL HIGH (ref 70–99)
Glucose-Capillary: 139 mg/dL — ABNORMAL HIGH (ref 70–99)
Glucose-Capillary: 146 mg/dL — ABNORMAL HIGH (ref 70–99)
Glucose-Capillary: 156 mg/dL — ABNORMAL HIGH (ref 70–99)
Glucose-Capillary: 179 mg/dL — ABNORMAL HIGH (ref 70–99)

## 2020-03-05 LAB — GRAM STAIN

## 2020-03-05 MED ORDER — ACETAMINOPHEN 160 MG/5ML PO SOLN
650.0000 mg | ORAL | Status: DC | PRN
  Administered 2020-03-05 – 2020-04-17 (×30): 650 mg
  Filled 2020-03-05 (×30): qty 20.3

## 2020-03-05 MED ORDER — PIPERACILLIN-TAZOBACTAM 3.375 G IVPB
3.3750 g | Freq: Three times a day (TID) | INTRAVENOUS | Status: DC
  Administered 2020-03-05 – 2020-03-15 (×30): 3.375 g via INTRAVENOUS
  Filled 2020-03-05 (×33): qty 50

## 2020-03-05 MED ORDER — LEVALBUTEROL HCL 0.63 MG/3ML IN NEBU
0.6300 mg | INHALATION_SOLUTION | Freq: Four times a day (QID) | RESPIRATORY_TRACT | Status: DC | PRN
  Administered 2020-03-05 – 2020-03-12 (×5): 0.63 mg via RESPIRATORY_TRACT
  Filled 2020-03-05 (×6): qty 3

## 2020-03-05 MED ORDER — VANCOMYCIN HCL 1750 MG/350ML IV SOLN
1750.0000 mg | Freq: Two times a day (BID) | INTRAVENOUS | Status: DC
  Administered 2020-03-05 – 2020-03-10 (×10): 1750 mg via INTRAVENOUS
  Filled 2020-03-05 (×11): qty 350

## 2020-03-05 MED ORDER — LIDOCAINE HCL (PF) 1 % IJ SOLN
INTRAMUSCULAR | Status: AC
  Filled 2020-03-05: qty 30

## 2020-03-05 MED ORDER — LEVALBUTEROL HCL 0.63 MG/3ML IN NEBU: 0.6300 mg | INHALATION_SOLUTION | Freq: Three times a day (TID) | RESPIRATORY_TRACT | Status: DC

## 2020-03-05 NOTE — Progress Notes (Signed)
Temp recheck 99.8 with Tylenol. PA ordered IV antibiotics and breathing treatment.

## 2020-03-05 NOTE — Procedures (Signed)
PROCEDURE SUMMARY:  Successful US guided left thoracentesis. Yielded 1 L of hazy amber fluid. Pt tolerated procedure well. No immediate complications.  Specimen was sent for labs. CXR ordered.  EBL < 5 mL  Ascencion Dike PA-C 03/05/2020 2:38 PM

## 2020-03-05 NOTE — Progress Notes (Signed)
Pharmacy Antibiotic Note  Brett Dawson is a 64 y.o. male admitted on 02/24/2020 s/p ESOPHAGECTOMY. Overnight increased SOB, Tm103, wbc 15, Cxr L infiltrate Cr0.7 Crcl approx 172ml/min.  Pharmacy has been consulted for vancomycin and zosyn dosing.  Plan: Vancomycin 1750mg  q12h Zosyn 3.375GM iv q8h  Height: 5\' 11"  (180.3 cm) Weight: 95.6 kg (210 lb 12.2 oz) IBW/kg (Calculated) : 75.3  Temp (24hrs), Avg:100.5 F (38.1 C), Min:98.8 F (37.1 C), Max:103.2 F (39.6 C)  Recent Labs  Lab 02/28/20 0430 02/29/20 0430 03/01/20 0437 03/02/20 0824 03/03/20 0941 03/04/20 0216 03/05/20 0112  WBC  --   --  9.9 12.5* 12.9* 13.8* 15.8*  CREATININE 0.66 0.65 0.65  --  0.64  --  0.72    Estimated Creatinine Clearance: 111.5 mL/min (by C-G formula based on SCr of 0.72 mg/dL).    No Known Allergies  Antimicrobials this admission: Cefoxitin periop 10/22 Cefazolin 10/30 Vanc 10/31> Zosyn 10/31>  Dose adjustments this admission:   Microbiology results:   Bonnita Nasuti Pharm.D. CPP, BCPS Clinical Pharmacist [phone] 03/05/2020 10:15 AM

## 2020-03-05 NOTE — Progress Notes (Signed)
Pt having increased work of breathing, hr in the 120s, and increased oxygen needs. On assessment pt appears to be in distress with pursed lip breathing and complaining of 2/10 left chest pain/aching. EKG and CXR performed. Rapid nurse [place] called as well as Prescott Gum, MD. Will continue to monitor.

## 2020-03-05 NOTE — Progress Notes (Addendum)
[address]       [place] [address]             [phone]      10 Days Post-Op Procedure(s) (LRB): XI ROBOTIC ASSISTED IVOR LEWIS ESOPHAGECTOMY (N/A) LAPARSCOPIC  JEJUNOSTOMY TUBE PLACEMENT (N/A) ESOPHAGOGASTRODUODENOSCOPY (EGD) (N/A) INTERCOSTAL NERVE BLOCK (Right) Subjective: Feels "hot", some nonproductive cough  Objective: Vital signs in last 24 hours: Temp:  [98.8 F (37.1 C)-103.2 F (39.6 C)] 103.2 F (39.6 C) (10/31 0739) Pulse Rate:  [90-119] 119 (10/31 0739) Cardiac Rhythm: Sinus tachycardia (10/31 0700) Resp:  [18-20] 19 (10/31 0256) BP: (93-128)/(51-79) 118/79 (10/31 0739) SpO2:  [91 %-96 %] 92 % (10/31 0800) Weight:  [95.6 kg] 95.6 kg (10/31 0520)  Hemodynamic parameters for last 24 hours:    Intake/Output from previous day: 10/30 0701 - 10/31 0700 In: 1570 [NG/GT:1520; IV Piggyback:50] Out: 4481 [Urine:1625] Intake/Output this shift: No intake/output data recorded.  General appearance: alert, cooperative and mild distress Heart: regular rate and rhythm and tachy Lungs: dim left>right lower fields Abdomen: benign Extremities: no edema Wound: j tube site with slight purulence  Lab Results: Recent Labs    03/04/20 0216 03/05/20 0112  WBC 13.8* 15.8*  HGB 9.9* 9.8*  HCT 30.8* 29.9*  PLT 257 285   BMET:  Recent Labs    03/03/20 0941 03/05/20 0112  NA 133* 135  K 4.4 3.9  CL 99 101  CO2 26 25  GLUCOSE 168* 113*  BUN 13 14  CREATININE 0.64 0.72  CALCIUM 8.0* 8.0*    PT/INR: No results for input(s): LABPROT, INR in the last 72 hours. ABG    Component Value Date/Time   PHART 7.454 (H) 02/25/2020 0500   HCO3 26.5 02/25/2020 0500   TCO2 27 02/24/2020 1944   ACIDBASEDEF 1.0 02/24/2020 1547   O2SAT 93.7 02/25/2020 0500   CBG (last 3)  Recent Labs    03/04/20 2338 03/05/20 0338 03/05/20 0742  GLUCAP 133* 131* 179*    Meds Scheduled Meds: . feeding supplement  1 Container Oral TID BM  . feeding  supplement (OSMOLITE 1.5 CAL)  1,520 mL Per Tube Q24H  . free water  100 mL Per Tube Q4H  . insulin aspart  0-24 Units Subcutaneous Q4H  . pantoprazole (PROTONIX) IV  40 mg Intravenous Q12H  . silver sulfADIAZINE   Topical BID  . sorbitol  30 mL Per Tube Once   Continuous Infusions: .  ceFAZolin (ANCEF) IV 1 g (03/05/20 0526)   PRN Meds:.acetaminophen, guaiFENesin-dextromethorphan, morphine injection, ondansetron (ZOFRAN) IV  Xrays DG Chest Port 1 View  Result Date: 03/05/2020 CLINICAL DATA:  Left pleural effusion.  Follow-up exam. EXAM: PORTABLE CHEST 1 VIEW COMPARISON:  03/03/2020. FINDINGS: Opacity on the left as mildly increased into the left upper lung/hemithorax, consistent with enlargement of the left pleural effusion and/or an increase in left lung atelectasis or consolidation. There is hazy and linear/reticular opacity at the right lung base which has increased from prior exam also consistent with an increase in pleural fluid and atelectasis. Right-sided chest tube is stable.  No pneumothorax. IMPRESSION: 1. Mild interval worsening of lung aeration with an increase in opacity in the left mid to upper lung and at the right lung base consistent with a combination an increase in pleural effusions and atelectasis and/or infiltrate. No evidence of pulmonary edema. 2. Stable right-sided chest tube.  No pneumothorax. Electronically Signed   By: Lajean Manes M.D.   On: 03/05/2020  05:73    Assessment/Plan: S/P Procedure(s) (LRB): XI ROBOTIC ASSISTED IVOR LEWIS ESOPHAGECTOMY (N/A) LAPARSCOPIC  JEJUNOSTOMY TUBE PLACEMENT (N/A) ESOPHAGOGASTRODUODENOSCOPY (EGD) (N/A) INTERCOSTAL NERVE BLOCK (Right)  1 Tmax 103.3, interval worsening of CXR infilt/effus picture. Bp fairly stable, tachy 2 sats 92 on 5 liters- will add xopenex 3 leukocytosis increased- will d/c ancef and change to Vanc/Zosyn for pneumonia 4 H/H pretty stable 5 BS control is ok 6 check covid pcr 7 stable renal fxn  LOS: 10  days    John Giovanni PA-C Pager [phone] 03/05/2020  With fever today, no myalgias associated or focal pain. Mildly productive cough.  Viral panel negative X-ray with increasing left pleural effusion and left lower lobe consolidation.  Starting IV vancomycin and Zosyn for possible pneumonia Ultrasound-guided left thoracentesis ordered for tomorrow to drain and culture fluid Not appear toxic.  We will panculture and watch in stepdown unit.

## 2020-03-05 NOTE — Plan of Care (Signed)

## 2020-03-05 NOTE — Significant Event (Signed)
Rapid Response Event Note   Reason for Call :  Respiratory distress  Initial Focused Assessment:  Pt sitting up in chair in moderate respiratory distress, with pursed lip breathing. Pt is alert and oriented, c/o 2/10 chest pain/aching in his L lower chest. Pt says he has a cough but isn't coughing up very much. Lungs clear in upper lobes and diminished in lower lobes(L lower lobe more diminished than R). Skin warm and dry. T-99.5, HR-115, BP-119/66 , RR-28, SpO2-92% on 4L [place].    Interventions:  EKG-ST PCXR-1. Mild interval worsening of lung aeration with an increase in opacity in the left mid to upper lung and at the right lung base consistent with a combination an increase in pleural effusions and atelectasis and/or infiltrate. No evidence of pulmonary edema.  Plan of Care:  Await call back from Dr. Prescott Gum. Continue to monitor pt closely. May titrate  if SpO2/WOB allows. Please call RRT if further assistance needed.    Event Summary:   MD Notified: Dr. Prescott Gum paged by [facility] RN, awaiting call back Call [place], Jermichael Belmares Anderson, RN

## 2020-03-06 ENCOUNTER — Inpatient Hospital Stay (HOSPITAL_COMMUNITY): Payer: 59

## 2020-03-06 LAB — URINE CULTURE
Culture: NO GROWTH
Special Requests: NORMAL

## 2020-03-06 LAB — BASIC METABOLIC PANEL
Anion gap: 10 (ref 5–15)
BUN: 16 mg/dL (ref 8–23)
CO2: 23 mmol/L (ref 22–32)
Calcium: 7.6 mg/dL — ABNORMAL LOW (ref 8.9–10.3)
Chloride: 97 mmol/L — ABNORMAL LOW (ref 98–111)
Creatinine, Ser: 0.78 mg/dL (ref 0.61–1.24)
GFR, Estimated: >60 mL/min (ref >60)
Glucose, Bld: 168 mg/dL — ABNORMAL HIGH (ref 70–99)
Potassium: 4.2 mmol/L (ref 3.5–5.1)
Sodium: 130 mmol/L — ABNORMAL LOW (ref 135–145)

## 2020-03-06 LAB — CBC
HCT: 30.3 % — ABNORMAL LOW (ref 39.0–52.0)
Hemoglobin: 10.1 g/dL — ABNORMAL LOW (ref 13.0–17.0)
MCH: 30.2 pg (ref 26.0–34.0)
MCHC: 33.3 g/dL (ref 30.0–36.0)
MCV: 90.7 fL (ref 80.0–100.0)
Platelets: 258 10*3/uL (ref 150–400)
RBC: 3.34 MIL/uL — ABNORMAL LOW (ref 4.22–5.81)
RDW: 18.2 % — ABNORMAL HIGH (ref 11.5–15.5)
WBC: 28.6 10*3/uL — ABNORMAL HIGH (ref 4.0–10.5)
nRBC: 0 % (ref 0.0–0.2)

## 2020-03-06 LAB — GLUCOSE, CAPILLARY
Glucose-Capillary: 159 mg/dL — ABNORMAL HIGH (ref 70–99)
Glucose-Capillary: 172 mg/dL — ABNORMAL HIGH (ref 70–99)
Glucose-Capillary: 113 mg/dL — ABNORMAL HIGH (ref 70–99)
Glucose-Capillary: 90 mg/dL (ref 70–99)
Glucose-Capillary: 114 mg/dL — ABNORMAL HIGH (ref 70–99)

## 2020-03-06 MED ORDER — IOHEXOL 300 MG/ML  SOLN
150.0000 mL | Freq: Once | INTRAMUSCULAR | Status: AC | PRN
  Administered 2020-03-06: 150 mL via ORAL

## 2020-03-06 NOTE — Progress Notes (Addendum)
[address]       [place] [address]             [phone]      11 Days Post-Op Procedure(s) (LRB): XI ROBOTIC ASSISTED IVOR LEWIS ESOPHAGECTOMY (N/A) LAPARSCOPIC  JEJUNOSTOMY TUBE PLACEMENT (N/A) ESOPHAGOGASTRODUODENOSCOPY (EGD) (N/A) INTERCOSTAL NERVE BLOCK (Right) Subjective: Feels fair overall, som DOE, nonprofuctive cough  Objective: Vital signs in last 24 hours: Temp:  [98.6 F (37 C)-102.4 F (39.1 C)] 100.6 F (38.1 C) (11/01 0855) Pulse Rate:  [99-116] 108 (11/01 0915) Cardiac Rhythm: Sinus tachycardia (11/01 0822) Resp:  [17-28] 21 (11/01 0915) BP: (96-133)/(54-95) 106/67 (11/01 0915) SpO2:  [90 %-93 %] 91 % (11/01 0915) Weight:  [97.5 kg] 97.5 kg (11/01 0400)  Hemodynamic parameters for last 24 hours:    Intake/Output from previous day: 10/31 0701 - 11/01 0700 In: 658.6 [P.O.:237; IV Piggyback:421.6] Out: -  Intake/Output this shift: No intake/output data recorded.  General appearance: alert, cooperative and no distress Heart: regular rate and rhythm and tachy Lungs: mostly clear throughout but diminished Abdomen: soft, non tender Extremities: no edema Wound: incis healing well  Lab Results: Recent Labs    03/05/20 0112 03/06/20 0451  WBC 15.8* 28.6*  HGB 9.8* 10.1*  HCT 29.9* 30.3*  PLT 285 258   BMET:  Recent Labs    03/05/20 0112 03/06/20 0451  NA 135 130*  K 3.9 4.2  CL 101 97*  CO2 25 23  GLUCOSE 113* 168*  BUN 14 16  CREATININE 0.72 0.78  CALCIUM 8.0* 7.6*    PT/INR: No results for input(s): LABPROT, INR in the last 72 hours. ABG    Component Value Date/Time   PHART 7.454 (H) 02/25/2020 0500   HCO3 26.5 02/25/2020 0500   TCO2 27 02/24/2020 1944   ACIDBASEDEF 1.0 02/24/2020 1547   O2SAT 93.7 02/25/2020 0500   CBG (last 3)  Recent Labs    03/05/20 2352 03/06/20 0420 03/06/20 0808  GLUCAP 146* 159* 172*    Meds Scheduled Meds: . feeding supplement  1 Container Oral TID BM  . feeding  supplement (OSMOLITE 1.5 CAL)  1,520 mL Per Tube Q24H  . free water  100 mL Per Tube Q4H  . insulin aspart  0-24 Units Subcutaneous Q4H  . pantoprazole (PROTONIX) IV  40 mg Intravenous Q12H  . silver sulfADIAZINE   Topical BID  . sorbitol  30 mL Per Tube Once   Continuous Infusions: . piperacillin-tazobactam (ZOSYN)  IV 3.375 g (03/06/20 0605)  . vancomycin 1,750 mg (03/06/20 0116)   PRN Meds:.acetaminophen (TYLENOL) oral liquid 160 mg/5 mL, guaiFENesin-dextromethorphan, levalbuterol, morphine injection, ondansetron (ZOFRAN) IV  Xrays DG Chest 1 View  Result Date: 03/05/2020 CLINICAL DATA:  Post thoracentesis on the left. EXAM: CHEST  1 VIEW COMPARISON:  March 05, 2020 FINDINGS: The left-sided pleural effusion is smaller in the interval after thoracentesis. However, effusion with underlying atelectasis does remain. No pneumothorax. The right-sided chest tube is stable. No definite pneumothorax on the right. The cardiomediastinal silhouette is stable. Atelectasis in the right base. No other acute abnormalities. IMPRESSION: 1. The left-sided pleural effusion is smaller in the interval after thoracentesis. No pneumothorax. 2. No other change. Electronically Signed   By: Dorise Bullion III M.D   On: 03/05/2020 15:05   DG Chest 2 View  Result Date: 03/06/2020 CLINICAL DATA:  Follow-up pneumonia EXAM: CHEST - 2 VIEW COMPARISON:  03/05/2020 FINDINGS: Unchanged position of right chest tube. No pneumothorax identified.  Stable cardiomediastinal contours. Large left pleural effusion with veil like opacification of the left mid and left upper lung zones is similar in volume to 03/05/20. No new findings. IMPRESSION: 1. Stable right chest tube. No pneumothorax. 2. Unchanged appearance of left pleural effusion compared with 03/05/20 Electronically Signed   By: Kerby Moors M.D.   On: 03/06/2020 07:44   DG CHEST PORT 1 VIEW  Result Date: 03/06/2020 CLINICAL DATA:  Chest tube in place. EXAM: PORTABLE  CHEST 1 VIEW COMPARISON:  Same day. FINDINGS: Stable cardiomediastinal silhouette. Right-sided chest tube is unchanged with no pneumothorax present. Stable moderate left pleural effusion is noted with associated atelectasis or pneumonia. Bony thorax is unremarkable. IMPRESSION: Stable right-sided chest tube with no pneumothorax present. Stable moderate left pleural effusion with associated atelectasis or pneumonia. Electronically Signed   By: Marijo Conception M.D.   On: 03/06/2020 09:22   DG Chest Port 1 View  Result Date: 03/05/2020 CLINICAL DATA:  Left pleural effusion.  Follow-up exam. EXAM: PORTABLE CHEST 1 VIEW COMPARISON:  03/03/2020. FINDINGS: Opacity on the left as mildly increased into the left upper lung/hemithorax, consistent with enlargement of the left pleural effusion and/or an increase in left lung atelectasis or consolidation. There is hazy and linear/reticular opacity at the right lung base which has increased from prior exam also consistent with an increase in pleural fluid and atelectasis. Right-sided chest tube is stable.  No pneumothorax. IMPRESSION: 1. Mild interval worsening of lung aeration with an increase in opacity in the left mid to upper lung and at the right lung base consistent with a combination an increase in pleural effusions and atelectasis and/or infiltrate. No evidence of pulmonary edema. 2. Stable right-sided chest tube.  No pneumothorax. Electronically Signed   By: Lajean Manes M.D.   On: 03/05/2020 05:52   Results for orders placed or performed during the hospital encounter of 02/24/20  Respiratory Panel by PCR     Status: None   Collection Time: 03/05/20 10:11 AM   Specimen: Nasopharyngeal Swab; Respiratory  Result Value Ref Range Status   Adenovirus NOT DETECTED NOT DETECTED Final   Coronavirus 229E NOT DETECTED NOT DETECTED Final    Comment: (NOTE) The Coronavirus on the Respiratory Panel, DOES NOT test for the novel  Coronavirus (2019 nCoV)    Coronavirus  HKU1 NOT DETECTED NOT DETECTED Final   Coronavirus NL63 NOT DETECTED NOT DETECTED Final   Coronavirus OC43 NOT DETECTED NOT DETECTED Final   Metapneumovirus NOT DETECTED NOT DETECTED Final   Rhinovirus / Enterovirus NOT DETECTED NOT DETECTED Final   Influenza A NOT DETECTED NOT DETECTED Final   Influenza B NOT DETECTED NOT DETECTED Final   Parainfluenza Virus 1 NOT DETECTED NOT DETECTED Final   Parainfluenza Virus 2 NOT DETECTED NOT DETECTED Final   Parainfluenza Virus 3 NOT DETECTED NOT DETECTED Final   Parainfluenza Virus 4 NOT DETECTED NOT DETECTED Final   Respiratory Syncytial Virus NOT DETECTED NOT DETECTED Final   Bordetella pertussis NOT DETECTED NOT DETECTED Final   Chlamydophila pneumoniae NOT DETECTED NOT DETECTED Final   Mycoplasma pneumoniae NOT DETECTED NOT DETECTED Final    Comment: Performed at [facility], [address], [place], [place] [address]  Gram stain     Status: None   Collection Time: 03/05/20  2:20 PM   Specimen: PATH Cytology Pleural fluid  Result Value Ref Range Status   Specimen Description PLEURAL FLUID  Final   Special Requests LEFT CHEST  Final  Gram Stain   Final    WBC PRESENT, PREDOMINANTLY PMN GRAM POSITIVE COCCI IN PAIRS CYTOSPIN SMEAR Performed at [facility], [place] [address], [place], [place] [address]    Report Status 03/05/2020 FINAL  Final  Culture, body fluid-bottle     Status: None (Preliminary result)   Collection Time: 03/05/20  2:20 PM   Specimen: Pleura  Result Value Ref Range Status   Specimen Description PLEURAL FLUID  Final   Special Requests LEFT CHEST  Final   Culture   Final    NO GROWTH < 24 HOURS Performed at [facility], [place] [address], [place], [place] [address]    Report Status PENDING  Incomplete  Culture, blood (Routine X 2) w Reflex to ID Panel     Status: None (Preliminary result)   Collection Time: 03/05/20  4:32 PM   Specimen: BLOOD  Result Value Ref Range Status   Specimen Description  BLOOD BLOOD RIGHT HAND  Final   Special Requests   Final    BOTTLES DRAWN AEROBIC ONLY Blood Culture adequate volume   Culture   Final    NO GROWTH < 24 HOURS Performed at [facility], [place] [address], [place], [place] [address]    Report Status PENDING  Incomplete  Culture, blood (Routine X 2) w Reflex to ID Panel     Status: None (Preliminary result)   Collection Time: 03/05/20  4:32 PM   Specimen: BLOOD  Result Value Ref Range Status   Specimen Description BLOOD BLOOD LEFT HAND  Final   Special Requests   Final    BOTTLES DRAWN AEROBIC ONLY Blood Culture results may not be optimal due to an inadequate volume of blood received in culture bottles   Culture   Final    NO GROWTH < 24 HOURS Performed at [facility], [place] [address], [place], [place] [address]    Report Status PENDING  Incomplete   Assessment/Plan: S/P Procedure(s) (LRB): XI ROBOTIC ASSISTED IVOR LEWIS ESOPHAGECTOMY (N/A) LAPARSCOPIC  JEJUNOSTOMY TUBE PLACEMENT (N/A) ESOPHAGOGASTRODUODENOSCOPY (EGD) (N/A) INTERCOSTAL NERVE BLOCK (Right)    1 conts with fevers, Tmax 102.4, BP ok, low grade tachycardia, nonproductive cough 2 sats ok on 8 liters HFNC 3 CXR- stable atx/infilts/effus appearance- 1 liter thoracentesis by IR yesterday 4 leukocytosis increased with WBC 28.6, conts vanc/zosyn 5 renal fxn is stable 6 BS fairly good control 7 viral cx neg 8 wound care assisting with J-tube site 9 getting swallow study today 10 cont nebs and pulm toilet, routine rehab as able 11 ? lovenox- thrombocytopenia has resolved  LOS: 11 days    John Giovanni PA-C Pager [phone] 03/06/2020  14 French pigtail catheter placed at the bedside.  Return of serous fluid. Swallow study performed.  No obvious leak.  I think the ulceration is likely the confluence of the staple lines. Continue chest tube management for now Continue antibiotics for now. Continue pulmonary toilet. Patient will remain n.p.o. for  now.  Kenzie Thoreson [name]

## 2020-03-06 NOTE — Plan of Care (Signed)
  Problem: Education: Goal: Knowledge of General Education information will improve Description: Including pain rating scale, medication(s)/side effects and non-pharmacologic comfort measures Outcome: Progressing   Problem: Clinical Measurements: Goal: Ability to maintain clinical measurements within normal limits will improve Outcome: Progressing Goal: Cardiovascular complication will be avoided Outcome: Progressing   

## 2020-03-06 NOTE — Progress Notes (Signed)
Bedside Left pleural chest tube placement procedure completed @ 0844 by Dr. Kipp Brood, NO complications. Follow up vital signs stable. Pt resting comfortably with minimal pain at L shoulder post insertion. TF turned off, pt now NPO. 1V CXR ordered. 244ml yellow, clear output from chest tube at -20cm suction. Documentation complete. Will continue to monitor patient.

## 2020-03-06 NOTE — Consult Note (Addendum)
[facility] Nurse Consult Note: Reason for Consult: Consult requested for J-tube site.  Tube is sutured down and is leaking mod amt green drainage around the insertion site.  Topical treatment will not resolve this problem; recommend replacement of the tube to resolve this condition.  Wound type: There is red moist painful partial thickness skin loss in patchy areas surrounding the tube to approx 3 cm; appearance is consistent with moisture associated skin damage.   Dressing procedure/placement/frequency: Topical treatment has already been ordered by the primary team for bedside nurses to perform as follows to protect skin and promote healing: Apply Silvadene to skin around J-tube exit site twice daily, then cover with ABD pad and tape. Please re-consult if further assistance is needed.  Thank-you,  Julien Girt MSN, [place], [place], [place], [place]

## 2020-03-06 NOTE — Progress Notes (Signed)
Occupational Therapy Treatment Patient Details Name: Brett Dawson MRN: [id number] DOB: May 27, 1955 Today's Date: 03/06/2020    History of present illness 64 yo male with Esophageal cancer. s/p 02/24/20 robotic assisted Ivor-Lewis esophagectomy with jejunostomy tube placement   OT comments  Patient continues to make progress towards goals in skilled OT session. Patient's session encompassed seated exercises EOB as pt is currently requiring max encouragement in order to participate in therapy. Pt with HR around 117 for the entirety of the session with RR ranging from 21-29 despite breaks and prompts for pursed lip breathing. Pt able to progress to EOB and complete incremental scoots to the [facility] at Mod I, but overall activity tolerance remains poor (of note significant fluid in L lung despite 1L of fluid being removed 10/31). Provided education with regard to increased activity and incentive spirometer; will continue to follow acutely.    Follow Up Recommendations  No OT follow up    Equipment Recommendations  None recommended by OT    Recommendations for Other Services      Precautions / Restrictions Precautions Precautions: Other (comment) Precaution Comments: chest tube Restrictions Weight Bearing Restrictions: No       Mobility Bed Mobility Overal bed mobility: Modified Independent             General bed mobility comments: Able to complete supine<>sit and sit<>supine with supervision, increased attention paid to chest tube and lines  Transfers                 General transfer comment: deferred    Balance                                           ADL either performed or assessed with clinical judgement   ADL Overall ADL's : Needs assistance/impaired                     Lower Body Dressing: Set up;Sitting/lateral leans Lower Body Dressing Details (indicate cue type and reason): socks             Functional mobility during  ADLs: Minimal assistance General ADL Comments: increased RR and self-limiting, requring max encouragement to participate     Vision       Perception     Praxis      Cognition Arousal/Alertness: Awake/alert Behavior During Therapy: WFL for tasks assessed/performed Overall Cognitive Status: Within Functional Limits for tasks assessed                                          Exercises General Exercises - Lower Extremity Ankle Circles/Pumps: AROM;Both;20 reps;Supine Long Arc Quad: AROM;Both;10 reps;Seated Hip Flexion/Marching: AROM;Both;10 reps;Seated   Shoulder Instructions       General Comments      Pertinent Vitals/ Pain       Pain Assessment: Faces Faces Pain Scale: Hurts little more Pain Location: chronic back pain Pain Descriptors / Indicators: Discomfort Pain Intervention(s): Limited activity within patient's tolerance;Monitored during session;Repositioned  Home Living                                          Prior Functioning/Environment  Frequency  Min 2X/week        Progress Toward Goals  OT Goals(current goals can now be found in the care plan section)  Progress towards OT goals: Progressing toward goals  Acute Rehab OT Goals Patient Stated Goal: get back to work OT Goal Formulation: With patient Time For Goal Achievement: 03/15/20 Potential to Achieve Goals: Good  Plan Discharge plan remains appropriate    Co-evaluation                 AM-PAC OT "6 Clicks" Daily Activity     Outcome Measure   Help from another person eating meals?: None Help from another person taking care of personal grooming?: A Little Help from another person toileting, which includes using toliet, bedpan, or urinal?: A Little Help from another person bathing (including washing, rinsing, drying)?: A Little Help from another person to put on and taking off regular upper body clothing?: None Help from another  person to put on and taking off regular lower body clothing?: A Little 6 Click Score: 20    End of Session    OT Visit Diagnosis: Muscle weakness (generalized) (M62.81)   Activity Tolerance Patient limited by fatigue;Patient limited by lethargy   Patient Left in bed;with call bell/phone within reach;with bed alarm set   Nurse Communication Mobility status        Time: 1051-1110 OT Time Calculation (min): 19 min  Charges: OT General Charges $OT Visit: 1 Visit OT Treatments $Self Care/Home Management : 8-22 mins  Corinne Ports E. Pitcairn, [employer] Acute Rehabilitation Services [place] 03/06/2020, 2:29 PM

## 2020-03-06 NOTE — Progress Notes (Signed)
PT Cancellation Note  Patient Details Name: Brett Dawson MRN: [id number] DOB: July 30, 1955   Cancelled Treatment:    Reason Eval/Treat Not Completed: (P) Medical issues which prohibited therapy;Patient declined, no reason specified (Pt continues to be febrile and fatigued.  Refused PT at this time reporting he wished to rest and RN reports not to push for participation at this time.)   Cristela Blue 03/06/2020, 4:45 PM  Erasmo Leventhal , PTA [place] Pager [phone] Office [phone]

## 2020-03-06 NOTE — Procedures (Addendum)
Insertion of Chest Tube Procedure Note  JUMAANE WEATHERFORD  [id number]  1955/08/13  Date:03/06/20  Time:8:30am   Provider Performing: Lajuana Matte   Procedure: Chest Tube Insertion (95638)  Indication(s) Effusion  Consent Risks of the procedure as well as the alternatives and risks of each were explained to the patient and/or caregiver.  Consent for the procedure was obtained and is signed in the bedside chart  Anesthesia Topical only with 1% lidocaine    Time Out Verified patient identification, verified procedure, site/side was marked, verified correct patient position, special equipment/implants available, medications/allergies/relevant history reviewed, required imaging and test results available.   Sterile Technique Maximal sterile technique including full sterile barrier drape, hand hygiene, sterile gown, sterile gloves, mask, hair covering, sterile ultrasound probe cover (if used).   Procedure Description Ultrasound not used to identify appropriate pleural anatomy for placement and overlying skin marked. Area of placement cleaned and draped in sterile fashion.  A 14 French pigtail pleural catheter was placed into the left pleural space using Seldinger technique. Appropriate return of fluid was obtained.  The tube was connected to atrium and placed on -20 cm H2O wall suction.   Complications/Tolerance None; patient tolerated the procedure well. Chest X-ray is ordered to verify placement.   EBL Minimal  Specimen(s) none

## 2020-03-07 ENCOUNTER — Other Ambulatory Visit: Payer: Self-pay | Admitting: *Deleted

## 2020-03-07 ENCOUNTER — Inpatient Hospital Stay (HOSPITAL_COMMUNITY): Payer: 59

## 2020-03-07 LAB — BASIC METABOLIC PANEL
Anion gap: 9 (ref 5–15)
BUN: 19 mg/dL (ref 8–23)
CO2: 26 mmol/L (ref 22–32)
Calcium: 8.1 mg/dL — ABNORMAL LOW (ref 8.9–10.3)
Chloride: 100 mmol/L (ref 98–111)
Creatinine, Ser: 0.83 mg/dL (ref 0.61–1.24)
GFR, Estimated: >60 mL/min (ref >60)
Glucose, Bld: 138 mg/dL — ABNORMAL HIGH (ref 70–99)
Potassium: 4.2 mmol/L (ref 3.5–5.1)
Sodium: 135 mmol/L (ref 135–145)

## 2020-03-07 LAB — GLUCOSE, CAPILLARY
Glucose-Capillary: 118 mg/dL — ABNORMAL HIGH (ref 70–99)
Glucose-Capillary: 119 mg/dL — ABNORMAL HIGH (ref 70–99)
Glucose-Capillary: 135 mg/dL — ABNORMAL HIGH (ref 70–99)
Glucose-Capillary: 136 mg/dL — ABNORMAL HIGH (ref 70–99)
Glucose-Capillary: 138 mg/dL — ABNORMAL HIGH (ref 70–99)
Glucose-Capillary: 150 mg/dL — ABNORMAL HIGH (ref 70–99)

## 2020-03-07 LAB — CBC
HCT: 29.7 % — ABNORMAL LOW (ref 39.0–52.0)
Hemoglobin: 9.6 g/dL — ABNORMAL LOW (ref 13.0–17.0)
MCH: 29.7 pg (ref 26.0–34.0)
MCHC: 32.3 g/dL (ref 30.0–36.0)
MCV: 92 fL (ref 80.0–100.0)
Platelets: 346 10*3/uL (ref 150–400)
RBC: 3.23 MIL/uL — ABNORMAL LOW (ref 4.22–5.81)
RDW: 18.2 % — ABNORMAL HIGH (ref 11.5–15.5)
WBC: 22.1 10*3/uL — ABNORMAL HIGH (ref 4.0–10.5)
nRBC: 0 % (ref 0.0–0.2)

## 2020-03-07 MED ORDER — FUROSEMIDE 10 MG/ML IJ SOLN
40.0000 mg | Freq: Once | INTRAMUSCULAR | Status: AC
  Administered 2020-03-07: 40 mg via INTRAVENOUS
  Filled 2020-03-07: qty 4

## 2020-03-07 MED ORDER — PROSOURCE TF PO LIQD
45.0000 mL | Freq: Every day | ORAL | Status: DC
  Administered 2020-03-07 – 2020-03-14 (×8): 45 mL
  Filled 2020-03-07 (×7): qty 45

## 2020-03-07 MED ORDER — IOHEXOL 300 MG/ML  SOLN
75.0000 mL | Freq: Once | INTRAMUSCULAR | Status: AC | PRN
  Administered 2020-03-07: 75 mL via INTRAVENOUS

## 2020-03-07 MED ORDER — ENOXAPARIN SODIUM 40 MG/0.4ML ~~LOC~~ SOLN
40.0000 mg | SUBCUTANEOUS | Status: DC
  Administered 2020-03-07: 40 mg via SUBCUTANEOUS
  Filled 2020-03-07 (×2): qty 0.4

## 2020-03-07 NOTE — Progress Notes (Signed)
PT Cancellation Note  Patient Details Name: Brett Dawson MRN: [id number] DOB: 1956/04/15   Cancelled Treatment:    Reason Eval/Treat Not Completed: Patient declined, no reason specified  Pt reports not being able to breathe and declined therapy today despite explanation of importance of upright and mobility. Will follow.  Marguarite Arbour A Jeremiah Curci 03/07/2020, 1:31 PM  [name], PT, DPT Acute Rehabilitation Services Pager [phone] Office [phone]

## 2020-03-07 NOTE — Progress Notes (Addendum)
[address]       [place] [address]             [phone]      12 Days Post-Op Procedure(s) (LRB): XI ROBOTIC ASSISTED IVOR LEWIS ESOPHAGECTOMY (N/A) LAPARSCOPIC  JEJUNOSTOMY TUBE PLACEMENT (N/A) ESOPHAGOGASTRODUODENOSCOPY (EGD) (N/A) INTERCOSTAL NERVE BLOCK (Right) Subjective:  Resting in bed, says he feels like he is not getting enough air into his lungs. O2 at 4Lnc, O2 sat 87-90%.   Objective: Vital signs in last 24 hours: Temp:  [99.1 F (37.3 C)-101.5 F (38.6 C)] 100.4 F (38 C) (11/02 0354) Pulse Rate:  [83-109] 102 (11/02 0354) Cardiac Rhythm: Normal sinus rhythm (11/02 0700) Resp:  [15-28] 20 (11/02 0354) BP: (92-127)/(53-71) 121/63 (11/02 0354) SpO2:  [88 %-94 %] 90 % (11/02 0354) Weight:  [95.5 kg] 95.5 kg (11/02 0354)  Intake/Output from previous day: 11/01 0701 - 11/02 0700 In: 107.7 [IV Piggyback:107.7] Out: 1280 [Urine:950; Chest Tube:330] Intake/Output this shift: No intake/output data recorded.  General appearance: alert, cooperative and and mild distress due to dyspnea. Heart: sinus tachycardia Lungs:remain clear but diminished. A left pleural pigtail catheter is secure, drainage 354ml since insertion yesterday.  Abdomen: soft, non tender except near the J-tube exit site which is inflammed and tender.  Extremities: no edema Wound: incis healing well  Lab Results: Recent Labs    03/06/20 0451 03/07/20 0106  WBC 28.6* 22.1*  HGB 10.1* 9.6*  HCT 30.3* 29.7*  PLT 258 346   BMET:  Recent Labs    03/06/20 0451 03/07/20 0106  NA 130* 135  K 4.2 4.2  CL 97* 100  CO2 23 26  GLUCOSE 168* 138*  BUN 16 19  CREATININE 0.78 0.83  CALCIUM 7.6* 8.1*    PT/INR: No results for input(s): LABPROT, INR in the last 72 hours. ABG    Component Value Date/Time   PHART 7.454 (H) 02/25/2020 0500   HCO3 26.5 02/25/2020 0500   TCO2 27 02/24/2020 1944   ACIDBASEDEF 1.0 02/24/2020 1547   O2SAT 93.7 02/25/2020 0500   CBG (last  3)  Recent Labs    03/06/20 2010 03/06/20 2358 03/07/20 0352  GLUCAP 114* 136* 119*    Meds Scheduled Meds: . feeding supplement  1 Container Oral TID BM  . feeding supplement (OSMOLITE 1.5 CAL)  1,520 mL Per Tube Q24H  . free water  100 mL Per Tube Q4H  . insulin aspart  0-24 Units Subcutaneous Q4H  . pantoprazole (PROTONIX) IV  40 mg Intravenous Q12H  . silver sulfADIAZINE   Topical BID  . sorbitol  30 mL Per Tube Once   Continuous Infusions: . piperacillin-tazobactam (ZOSYN)  IV 3.375 g (03/07/20 0630)  . vancomycin 1,750 mg (03/07/20 0141)   PRN Meds:.acetaminophen (TYLENOL) oral liquid 160 mg/5 mL, guaiFENesin-dextromethorphan, levalbuterol, morphine injection, ondansetron (ZOFRAN) IV  Xrays DG Chest 1 View  Result Date: 03/05/2020 CLINICAL DATA:  Post thoracentesis on the left. EXAM: CHEST  1 VIEW COMPARISON:  March 05, 2020 FINDINGS: The left-sided pleural effusion is smaller in the interval after thoracentesis. However, effusion with underlying atelectasis does remain. No pneumothorax. The right-sided chest tube is stable. No definite pneumothorax on the right. The cardiomediastinal silhouette is stable. Atelectasis in the right base. No other acute abnormalities. IMPRESSION: 1. The left-sided pleural effusion is smaller in the interval after thoracentesis. No pneumothorax. 2. No other change. Electronically Signed   By: Dorise Bullion III M.D   On: 03/05/2020 15:05  DG Chest 2 View  Result Date: 03/06/2020 CLINICAL DATA:  Follow-up pneumonia EXAM: CHEST - 2 VIEW COMPARISON:  03/05/2020 FINDINGS: Unchanged position of right chest tube. No pneumothorax identified. Stable cardiomediastinal contours. Large left pleural effusion with veil like opacification of the left mid and left upper lung zones is similar in volume to 03/05/20. No new findings. IMPRESSION: 1. Stable right chest tube. No pneumothorax. 2. Unchanged appearance of left pleural effusion compared with 03/05/20  Electronically Signed   By: Kerby Moors M.D.   On: 03/06/2020 07:44   DG CHEST PORT 1 VIEW  Result Date: 03/06/2020 CLINICAL DATA:  Chest tube in place. EXAM: PORTABLE CHEST 1 VIEW COMPARISON:  Same day. FINDINGS: Stable cardiomediastinal silhouette. Right-sided chest tube is unchanged with no pneumothorax present. Stable moderate left pleural effusion is noted with associated atelectasis or pneumonia. Bony thorax is unremarkable. IMPRESSION: Stable right-sided chest tube with no pneumothorax present. Stable moderate left pleural effusion with associated atelectasis or pneumonia. Electronically Signed   By: Marijo Conception M.D.   On: 03/06/2020 09:22   DG ESOPHAGUS W SINGLE CM (SOL OR THIN BA)  Result Date: 03/06/2020 CLINICAL DATA:  Esophageal cancer with gastric pull-through and mid high anastomosis about the vertical level of the AP window. Pleural effusions. EXAM: ESOPHOGRAM/BARIUM SWALLOW TECHNIQUE: Single contrast examination was performed using 150 cc Omnipaque 300. FLUOROSCOPY TIME:  Fluoroscopy Time:  3 minutes, 48 seconds Radiation Exposure Index (if provided by the fluoroscopic device): 46.2 mGy Number of Acquired Spot Images: 0 COMPARISON:  02/29/2020 FINDINGS: A right chest tube is in place. Gastric pull-through noted with mild irregularity of the lumen along the gastroesophageal staple line for example on series 19. While a free leak is not identified, there is a small outpouching along the anastomotic staple line for example on series 28 posteriorly. This is also shown on series 37. The stomach is nearly entirely in the chest, with the duodenal bulb just below the diaphragm. Contrast slowly extends into the duodenal bulb and descending duodenum. IMPRESSION: 1. Along the anastomotic site there is a small contained collection of contrast which could be from an ulceration or potentially a small contained perforation. However, I do not observe a frank leak of contrast or free spread of contrast  from this region. Electronically Signed   By: Van Clines M.D.   On: 03/06/2020 11:10   Results for orders placed or performed during the hospital encounter of 02/24/20  Respiratory Panel by PCR     Status: None   Collection Time: 03/05/20 10:11 AM   Specimen: Nasopharyngeal Swab; Respiratory  Result Value Ref Range Status   Adenovirus NOT DETECTED NOT DETECTED Final   Coronavirus 229E NOT DETECTED NOT DETECTED Final    Comment: (NOTE) The Coronavirus on the Respiratory Panel, DOES NOT test for the novel  Coronavirus (2019 nCoV)    Coronavirus HKU1 NOT DETECTED NOT DETECTED Final   Coronavirus NL63 NOT DETECTED NOT DETECTED Final   Coronavirus OC43 NOT DETECTED NOT DETECTED Final   Metapneumovirus NOT DETECTED NOT DETECTED Final   Rhinovirus / Enterovirus NOT DETECTED NOT DETECTED Final   Influenza A NOT DETECTED NOT DETECTED Final   Influenza B NOT DETECTED NOT DETECTED Final   Parainfluenza Virus 1 NOT DETECTED NOT DETECTED Final   Parainfluenza Virus 2 NOT DETECTED NOT DETECTED Final   Parainfluenza Virus 3 NOT DETECTED NOT DETECTED Final   Parainfluenza Virus 4 NOT DETECTED NOT DETECTED Final   Respiratory Syncytial Virus NOT DETECTED NOT  DETECTED Final   Bordetella pertussis NOT DETECTED NOT DETECTED Final   Chlamydophila pneumoniae NOT DETECTED NOT DETECTED Final   Mycoplasma pneumoniae NOT DETECTED NOT DETECTED Final    Comment: Performed at [facility], [place] [address], [place], [place] [address]  Gram stain     Status: None   Collection Time: 03/05/20  2:20 PM   Specimen: PATH Cytology Pleural fluid  Result Value Ref Range Status   Specimen Description PLEURAL FLUID  Final   Special Requests LEFT CHEST  Final   Gram Stain   Final    WBC PRESENT, PREDOMINANTLY PMN GRAM POSITIVE COCCI IN PAIRS CYTOSPIN SMEAR Performed at [facility], [place] [address] [place], [place] [address]    Report Status 03/05/2020 FINAL  Final  Culture, body fluid-bottle      Status: None (Preliminary result)   Collection Time: 03/05/20  2:20 PM   Specimen: Pleura  Result Value Ref Range Status   Specimen Description PLEURAL FLUID  Final   Special Requests LEFT CHEST  Final   Culture   Final    NO GROWTH 2 DAYS Performed at [place] [address], [place], [place] [address]    Report Status PENDING  Incomplete  Urine culture     Status: None   Collection Time: 03/05/20  3:48 PM   Specimen: Urine, Clean Catch  Result Value Ref Range Status   Specimen Description URINE, CLEAN CATCH  Final   Special Requests Normal  Final   Culture   Final    NO GROWTH Performed at [facility], [place] [address], [place], [place] [address]    Report Status 03/06/2020 FINAL  Final  Culture, blood (Routine X 2) w Reflex to ID Panel     Status: None (Preliminary result)   Collection Time: 03/05/20  4:32 PM   Specimen: BLOOD  Result Value Ref Range Status   Specimen Description BLOOD BLOOD RIGHT HAND  Final   Special Requests   Final    BOTTLES DRAWN AEROBIC ONLY Blood Culture adequate volume   Culture   Final    NO GROWTH 2 DAYS Performed at [facility], [place] [address] [place], [place] [address]    Report Status PENDING  Incomplete  Culture, blood (Routine X 2) w Reflex to ID Panel     Status: None (Preliminary result)   Collection Time: 03/05/20  4:32 PM   Specimen: BLOOD  Result Value Ref Range Status   Specimen Description BLOOD BLOOD LEFT HAND  Final   Special Requests   Final    BOTTLES DRAWN AEROBIC ONLY Blood Culture results may not be optimal due to an inadequate volume of blood received in culture bottles   Culture   Final    NO GROWTH 2 DAYS Performed at [facility], [place] [address] [place],  [address]    Report Status PENDING  Incomplete   Assessment/Plan: S/P Procedure(s) (LRB): XI ROBOTIC ASSISTED IVOR LEWIS ESOPHAGECTOMY (N/A) LAPARSCOPIC  JEJUNOSTOMY TUBE PLACEMENT (N/A) ESOPHAGOGASTRODUODENOSCOPY (EGD)  (N/A) INTERCOSTAL NERVE BLOCK (Right)     -POD-12 Ivor-Lewis esophagectomy and 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 Esophagram early post-op and repeat study yesterday show no anatsamotic leak and free flow of contrast through the pylorus. Continuing nutritional support with nocturnal J-tube feedings. Keep NPO for now.   -Fever / leukocytosis- respiratory viral panel negative. Temp curve and WBC are trending down. On day 3  Vanc / Zosyn.  No clear source except for some cellulitis at the J-tube exit site. Repeating CXR this AM.  Blood Cx negative at 2 days. Continue empiric treatment.   -Left pleural effusion- POD 2 thoracentesis by IR yielding 1L "hazy amber" fluid. Gram stain on pleural fluid showed Gm+ cocci in pairs, but Cx shows no growth at 2 days. The post thoracentesis CXR showed persistent  Left pleural fluid / volume loss. A left pleural pigtail catheter was placed at the bedside on 03/06/20 and has drained 350ml since insertion.   -Hypoxia- Sat 90-% on 4LO2 this morning and he is more short of breath. Does not appear volume overloaded on exam and Wt is well below pre-op Wt. Will repeat the CXR.   -Thrombocytopenia- resolved.   -DVT PPX- add enoxaparin daily.   -Hyperglycemia- mild, related to TF. No H/O DM. Still requires occasional insulin coverage so will continue the SSI.    [name] PA-C Pager [phone] 03/07/2020  Chest x-ray shows a persistent left effusion. Will obtain CT-guided drain placement. Continue n.p.o. for right now until his respiratory status improves.  Lu Paradise [name]

## 2020-03-07 NOTE — Consult Note (Signed)
Chief Complaint: Patient was seen in consultation today for left chest tube placement   Referring Physician(s): Dr. Kipp Brood  Supervising Physician: Aletta Edouard  Patient Status: [facility] - In-pt  History of Present Illness: Brett [facility] is a 64 y.o. male with a medical history significant for esophageal cancer. He presented to the ED May 2021 with symptomatic anemia and it was during this work up that an esophageal mass was discovered. 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 He underwent neoadjuvant chemotherapy and radiation prior to a robotic-assisted Ivor Lewis esophagectomy with Dr. Kipp Brood 02/24/20. During that procedure a jejunostomy tube was placed. He developed a left pleural effusion on POD 2 and he presented to IR for a thoracentesis which yielded 1L of fluid. The pleural effusion persisted and a left pleural pigtail catheter was placed at the bedside 03/06/20.   The patient continues to be hypoxic and short of breath. Additional imaging was ordered.   CT chest 03/07/20: IMPRESSION: 1. Previous esophagectomy and pull-through surgery. 2. Large loculated pleural fluid collection posteriorly on the left with air bubbles and air-fluid level consistent with empyema. The left-sided chest tube is not visibly in communication with the loculated collection. 3. Small amount of pleural fluid on the right with dependent pulmonary atelectasis. 4. 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  Interventional Radiology has been asked to evaluate this patient for an image-guided left chest tube into the loculated left posterior fluid collection. This case has been reviewed and procedure approved by Dr. Kathlene Cote.   Past Medical History:  Diagnosis Date  . Anemia   . Anemia   . Malignant neoplasm of lower third of esophagus ([place]) 11/24/2019    Past Surgical History:  Procedure Laterality  Date  . BIOPSY  11/23/2019   Procedure: BIOPSY;  Surgeon: Arta Silence, MD;  Location: [facility];  Service: Endoscopy;;  . ESOPHAGOGASTRODUODENOSCOPY N/A 02/24/2020   Procedure: ESOPHAGOGASTRODUODENOSCOPY (EGD);  Surgeon: Lajuana Matte, MD;  Location: [facility] OR;  Service: Thoracic;  Laterality: N/A;  . ESOPHAGOGASTRODUODENOSCOPY (EGD) WITH PROPOFOL N/A 11/23/2019   Procedure: ESOPHAGOGASTRODUODENOSCOPY (EGD) WITH PROPOFOL;  Surgeon: Arta Silence, MD;  Location: [facility];  Service: Endoscopy;  Laterality: N/A;  . INTERCOSTAL NERVE BLOCK Right 02/24/2020   Procedure: INTERCOSTAL NERVE BLOCK;  Surgeon: Lajuana Matte, MD;  Location: [place];  Service: Thoracic;  Laterality: Right;  . TONSILLECTOMY      Allergies: Patient has no known allergies.  Medications: Prior to Admission medications   Medication Sig Start Date End Date Taking? Authorizing Provider  Colchicine 0.6 MG CAPS Take 0.6 mg by mouth 2 (two) times daily as needed (gout).  11/02/19  Yes [provider]  FEROSUL 325 (65 Fe) MG tablet Take 1 tablet (325 mg total) by mouth daily. Patient not taking: Reported on 02/21/2020 12/13/19   [name], MD  ondansetron (ZOFRAN) 8 MG tablet Take 1 tablet (8 mg total) by mouth 2 (two) times daily as needed for refractory nausea / vomiting. Start on day 3 after chemo. 11/27/19   [name], MD  prochlorperazine (COMPAZINE) 10 MG tablet TAKE 1 TABLET(10 MG) BY MOUTH EVERY 6 HOURS AS NEEDED FOR NAUSEA OR VOMITING Patient taking differently: Take 10 mg by mouth every 6 (six) hours as needed for nausea or vomiting.  12/13/19   [name], MD  sucralfate (CARAFATE) 1 g tablet Take 1 tablet (1 g total) by mouth 4 (four) times  daily. Dissolve each tablet in 15 cc water before use. Patient taking differently: Take 1 g by mouth 4 (four) times daily as needed (throat pain). Dissolve each tablet in 15 cc water before use. 12/24/19   Kyung Rudd, MD     Family History  Problem Relation  Age of Onset  . Hyperlipidemia Mother     Social History   Socioeconomic History  . Marital status: Legally Separated    Spouse name: Not on file  . Number of children: 1  . Years of education: Not on file  . Highest education level: High school graduate  Occupational History  . Occupation: maintance/security at [employer]  Tobacco Use  . Smoking status: Never Smoker  . Smokeless tobacco: Never Used  Vaping Use  . Vaping Use: Former  Substance and Sexual Activity  . Alcohol use: Yes    Alcohol/week: 12.0 standard drinks    Types: 12 Cans of beer per week  . Drug use: Yes    Types: Marijuana  . Sexual activity: Yes  Other Topics Concern  . Not on file  Social History Narrative  . Not on file   Social Determinants of Health   Financial Resource Strain: Low Risk   . Difficulty of Paying Living Expenses: Not hard at all  Food Insecurity: No Food Insecurity  . Worried About Charity fundraiser in the Last Year: Never true  . Ran Out of Food in the Last Year: Never true  Transportation Needs: No Transportation Needs  . Lack of Transportation (Medical): No  . Lack of Transportation (Non-Medical): No  Physical Activity:   . Days of Exercise per Week: Not on file  . Minutes of Exercise per Session: Not on file  Stress: No Stress Concern Present  . Feeling of Stress : Not at all  Social Connections: Moderately Isolated  . Frequency of Communication with Friends and Family: More than three times a week  . Frequency of Social Gatherings with Friends and Family: More than three times a week  . Attends Religious Services: 1 to 4 times per year  . Active Member of Clubs or Organizations: No  . Attends Archivist Meetings: Never  . Marital Status: Separated    Review of Systems: A 12 point ROS discussed and pertinent positives are indicated in the HPI above.  All other systems are negative.  Review of Systems  Constitutional: Positive for appetite change  and fatigue.  Respiratory: Positive for shortness of breath. Negative for cough.   Cardiovascular: Negative for chest pain and leg swelling.  Gastrointestinal: Negative for abdominal pain, diarrhea, nausea and vomiting.  Neurological: Negative for headaches.    Vital Signs: BP (!) 128/93 (BP Location: Right Arm)   Pulse (!) 102   Temp (!) 101.2 F (38.4 C) (Oral)   Resp 20   Ht 5\' 11"  (1.803 m)   Wt 210 lb 9.6 oz (95.5 kg)   SpO2 93%   BMI 29.37 kg/m   Physical Exam Constitutional:      General: He is not in acute distress. HENT:     Mouth/Throat:     Mouth: Mucous membranes are moist.     Pharynx: Oropharynx is clear.  Cardiovascular:     Rate and Rhythm: Regular rhythm. Tachycardia present.  Pulmonary:     Comments: Increased work of breathing. Breath sounds diminished on the right, rhonchi on the left.  Abdominal:     General: Bowel sounds are normal.  Palpations: Abdomen is soft.     Comments: J-tube in place. RUQ drain to suction.   Skin:    General: Skin is warm and dry.  Neurological:     Mental Status: He is alert and oriented to person, place, and time.     Imaging: DG Chest 1 View  Result Date: 03/05/2020 CLINICAL DATA:  Post thoracentesis on the left. EXAM: CHEST  1 VIEW COMPARISON:  March 05, 2020 FINDINGS: The left-sided pleural effusion is smaller in the interval after thoracentesis. However, effusion with underlying atelectasis does remain. No pneumothorax. The right-sided chest tube is stable. No definite pneumothorax on the right. The cardiomediastinal silhouette is stable. Atelectasis in the right base. No other acute abnormalities. IMPRESSION: 1. The left-sided pleural effusion is smaller in the interval after thoracentesis. No pneumothorax. 2. No other change. Electronically Signed   By: Dorise Bullion III M.D   On: 03/05/2020 15:05   DG Chest 2 View  Result Date: 03/06/2020 CLINICAL DATA:  Follow-up pneumonia EXAM: CHEST - 2 VIEW COMPARISON:   03/05/2020 FINDINGS: Unchanged position of right chest tube. No pneumothorax identified. Stable cardiomediastinal contours. Large left pleural effusion with veil like opacification of the left mid and left upper lung zones is similar in volume to 03/05/20. No new findings. IMPRESSION: 1. Stable right chest tube. No pneumothorax. 2. Unchanged appearance of left pleural effusion compared with 03/05/20 Electronically Signed   By: Kerby Moors M.D.   On: 03/06/2020 07:44   DG Chest 2 View  Result Date: 02/29/2020 CLINICAL DATA:  Pleural effusion EXAM: CHEST - 2 VIEW COMPARISON:  February 28, 2020. FINDINGS: Chest tubes again noted on the right. Central catheter tip in superior vena cava. Nasogastric tube tip below diaphragm. No appreciable pneumothorax. Fairly small pleural effusions bilaterally with ill-defined airspace opacity in each lung base. Heart is upper normal in size with pulmonary vascularity normal. No adenopathy. No bone lesions. IMPRESSION: Tube and catheter positions as described without pneumothorax. Early small pleural effusions bilaterally. Atelectasis with questionable pneumonia in each lung base. Heart size normal. Electronically Signed   By: Lowella Grip III M.D.   On: 02/29/2020 11:47   DG Chest 2 View  Result Date: 02/22/2020 CLINICAL DATA:  Esophageal neoplasm EXAM: CHEST - 2 VIEW COMPARISON:  PET CT 02/17/2020 FINDINGS: No focal opacity or pleural effusion. Normal cardiomediastinal silhouette. Coil like opacity over the right shoulder is presumably artifact. No pneumothorax. There are degenerative changes of the spine. IMPRESSION: No active cardiopulmonary disease. Electronically Signed   By: Donavan Foil M.D.   On: 02/22/2020 15:59   CT CHEST W CONTRAST  Result Date: 03/07/2020 CLINICAL DATA:  Shortness of breath and hypoxia. Bilateral chest tubes. EXAM: CT CHEST WITH CONTRAST TECHNIQUE: Multidetector CT imaging of the chest was performed during intravenous contrast  administration. CONTRAST:  57mL OMNIPAQUE IOHEXOL 300 MG/ML  SOLN COMPARISON:  Radiography same day. FINDINGS: Cardiovascular: Heart size is normal.  No pericardial fluid. Mediastinum/Nodes: Previous esophagectomy and pull-through surgery. Lungs/Pleura: On the right, there is a small amount of pleural fluid layering dependently and there is mild-to-moderate dependent pulmonary atelectasis. Right chest tube is in place. No pleural air. Chest tube is in communication with the residual pleural density. On the left, there is large pleural fluid collection posteriorly which appears loculated with air bubbles and air-fluid level consistent with empyema. The adjacent dependent left lung shows atelectasis. The left-sided chest tube is not visibly in communication with the loculated collection. No free pleural air. Upper Abdomen:  Hypoperfusion of the ventral corner of the spleen suggesting splenic infarct. Multiple renal cysts as seen previously. Musculoskeletal: Ordinary thoracic degenerative changes. IMPRESSION: 1. Previous esophagectomy and pull-through surgery. 2. Large loculated pleural fluid collection posteriorly on the left with air bubbles and air-fluid level consistent with empyema. The left-sided chest tube is not visibly in communication with the loculated collection. 3. Small amount of pleural fluid on the right with dependent pulmonary atelectasis. 4. 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 Electronically Signed   By: Nelson Chimes M.D.   On: 03/07/2020 16:26   NM PET Image Restag (PS) Skull Base To Thigh  Result Date: 02/17/2020 CLINICAL DATA:  Subsequent treatment strategy for esophageal cancer. EXAM: NUCLEAR MEDICINE PET SKULL BASE TO THIGH TECHNIQUE: 10.5 mCi F-18 FDG was injected intravenously. Full-ring PET imaging was performed from the skull base to thigh after the radiotracer. CT data was obtained and used for attenuation correction and anatomic localization.  Fasting blood glucose: 108 mg/dl COMPARISON:  Multiple exams, including 11/22/2019 FINDINGS: Mediastinal blood pool activity: SUV max 2.1 Liver activity: SUV max NA NECK: No significant abnormal hypermetabolic activity in this region. Incidental CT findings: Bilateral common carotid atherosclerotic calcification. CHEST: There continues to be prominent distal esophageal circumferential wall thickening but with dramatically improved metabolic activity, maximum SUV in the distal esophagus and extending into the proximal stomach currently 3.8, previously 20.2. Incidental CT findings: Coronary, aortic arch, and branch vessel atherosclerotic vascular disease. ABDOMEN/PELVIS: Right gastric lymph node 1.1 cm in short axis on image 110 of series 4 (stable size) with maximum SUV 2.2 (formerly 4.8). Photopenic renal cysts noted. Incidental CT findings: Right hepatic lobe cyst. 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 Colonic diverticulosis. SKELETON: No significant abnormal hypermetabolic activity in this region. Incidental CT findings: Thoracic and lower lumbar spondylosis. IMPRESSION: 1. Marked reduction in activity in the distal esophagus and extending into the stomach, maximum SUV 3.8, formerly 20.2. There does continue to be circumferential wall thickening in the distal esophagus. 2. Right gastric lymph node is stable in size at 1.1 cm in short axis, but has decreased in activity from previous SUV of 4.8 to current maximum SUV of 2.2. 3. Other imaging findings of potential clinical significance: Aortic Atherosclerosis (ICD10-I70.0). Coronary atherosclerosis. Hepatic and renal cysts. Colonic diverticulosis. Stranding in the central mesentery with some scattered mesenteric lymph nodes favoring sclerosing mesenteritis. Electronically Signed   By: Van Clines M.D.   On: 02/17/2020 13:53   DG CHEST PORT 1  VIEW  Result Date: 03/07/2020 CLINICAL DATA:  Shortness of breath.  Hypoxia. EXAM: PORTABLE CHEST 1 VIEW COMPARISON:  05/2019, 03/05/2020. FINDINGS: Right chest tube in stable position. No pneumothorax. Persistent large left pleural effusion. Underlying atelectasis/infiltrate cannot be excluded. Right base pleural-parenchymal thickening/postsurgical change again noted. Mild right upper lung infiltrate noted on today's exam. Small right pleural effusion again noted. Heart size stable. IMPRESSION: 1. Right chest tube in stable position. 2. No pneumothorax. Persistent large left pleural effusion. Underlying atelectasis/infiltrate cannot be excluded. No interim change in appearance. 3. Mild right upper lung infiltrate noted on today's exam. Right base pleural-parenchymal thickening/postsurgical change again noted. Small right pleural effusion again noted. Electronically Signed   By: Marcello Moores  Register   On: 03/07/2020 08:49   DG CHEST PORT 1 VIEW  Result Date: 03/06/2020 CLINICAL DATA:  Chest tube in place. EXAM: PORTABLE CHEST 1 VIEW COMPARISON:  Same day. FINDINGS:  Stable cardiomediastinal silhouette. Right-sided chest tube is unchanged with no pneumothorax present. Stable moderate left pleural effusion is noted with associated atelectasis or pneumonia. Bony thorax is unremarkable. IMPRESSION: Stable right-sided chest tube with no pneumothorax present. Stable moderate left pleural effusion with associated atelectasis or pneumonia. Electronically Signed   By: Marijo Conception M.D.   On: 03/06/2020 09:22   DG Chest Port 1 View  Result Date: 03/05/2020 CLINICAL DATA:  Left pleural effusion.  Follow-up exam. EXAM: PORTABLE CHEST 1 VIEW COMPARISON:  03/03/2020. FINDINGS: Opacity on the left as mildly increased into the left upper lung/hemithorax, consistent with enlargement of the left pleural effusion and/or an increase in left lung atelectasis or consolidation. There is hazy and linear/reticular opacity at the  right lung base which has increased from prior exam also consistent with an increase in pleural fluid and atelectasis. Right-sided chest tube is stable.  No pneumothorax. IMPRESSION: 1. Mild interval worsening of lung aeration with an increase in opacity in the left mid to upper lung and at the right lung base consistent with a combination an increase in pleural effusions and atelectasis and/or infiltrate. No evidence of pulmonary edema. 2. Stable right-sided chest tube.  No pneumothorax. Electronically Signed   By: Lajean Manes M.D.   On: 03/05/2020 05:52   DG CHEST PORT 1 VIEW  Result Date: 03/03/2020 CLINICAL DATA:  Cough. EXAM: PORTABLE CHEST 1 VIEW COMPARISON:  Chest x-ray 02/29/2020. FINDINGS: Interval removal of right IJ line. Interval removal of 1 of the 2 chest tubes. Remaining chest tube in stable position. No pneumothorax. Heart size normal. Progressive left pleural effusion. Underlying infiltrate most likely present. Stable small right pleural effusion and right base atelectasis. IMPRESSION: 1. Interval removal of right IJ line. Interval removal of 1 of the 2 chest tubes. Remaining chest tube in stable position. No pneumothorax. 2. Progressive left pleural effusion. Underlying infiltrate most likely present. Stable small right pleural effusion and right base atelectasis. Electronically Signed   By: Marcello Moores  Register   On: 03/03/2020 08:14   DG Chest Port 1 View  Result Date: 02/28/2020 CLINICAL DATA:  Pleural effusion. EXAM: PORTABLE CHEST 1 VIEW COMPARISON:  02/27/2020 FINDINGS: 0538 hours. The NG tube passes into the stomach although the distal tip position is not included on the film. Right-sided chest tubes again noted without evidence for pneumothorax. Right IJ central line tip overlies the mid SVC level. Similar appearance left base collapse/consolidation with probable layering left pleural effusion. Telemetry leads overlie the chest. IMPRESSION: 1. Stable exam. Right-sided chest tubes  without evidence for pneumothorax. 2. Similar appearance of left base collapse/consolidation with probable layering left pleural effusion. Electronically Signed   By: Misty Stanley M.D.   On: 02/28/2020 06:50   DG CHEST PORT 1 VIEW  Result Date: 02/27/2020 CLINICAL DATA:  Pleural effusion EXAM: PORTABLE CHEST 1 VIEW COMPARISON:  Chest x-ray dated 02/26/2020. FINDINGS: Stable opacity at the LEFT lung base, likely atelectasis and/or layering pleural effusion. RIGHT lung is clear. RIGHT-sided chest tubes are stable in position. No pneumothorax is seen. Cardiomegaly. Enteric tube passes below the diaphragm. RIGHT IJ central line appears adequately positioned with tip at the level of the mid SVC. IMPRESSION: 1. Stable opacity at the LEFT lung base, likely atelectasis and/or layering pleural effusion. 2. RIGHT lung is clear. 3. RIGHT-sided chest tubes are stable in position. No pneumothorax is seen. Electronically Signed   By: Franki Cabot M.D.   On: 02/27/2020 08:19   DG CHEST PORT 1 VIEW  Result Date: 02/26/2020 CLINICAL DATA:  Pneumothorax EXAM: PORTABLE CHEST 1 VIEW COMPARISON:  Two days ago FINDINGS: Increased hazy density at the left base. Probable trace left apical pneumothorax. Right chest tubes in place with no visible right pneumothorax. Right IJ line with tip at the SVC. The enteric tube reaches the stomach. Probable pneumoperitoneum, expected after recent surgery. Normal heart size for technique. IMPRESSION: 1. Increased hazy opacity at the left more than right base, likely atelectasis and pleural fluid. 2. Probable trace left apical pneumothorax. Electronically Signed   By: Monte Fantasia M.D.   On: 02/26/2020 08:22   DG Chest Port 1 View  Result Date: 02/24/2020 CLINICAL DATA:  History of recent esophagectomy. EXAM: PORTABLE CHEST 1 VIEW COMPARISON:  February 22, 2020 FINDINGS: A nasogastric tube is seen with its distal tip extending into the body of the stomach. A right internal jugular  venous catheter is noted with its distal tip overlying the junction of the superior vena cava and right atrium. 2 right-sided chest tubes are seen. The distal tips of both are seen overlying the medial aspect of the right apex. Very mild left basilar atelectasis and/or early infiltrate is seen. There is no evidence of a pleural effusion or pneumothorax. The heart size and mediastinal contours are within normal limits. A very thin curvilinear lucency is seen just below the right hemidiaphragm. Multilevel degenerative changes seen throughout the thoracic spine. IMPRESSION: 1. Interval support line placement positioning, as described above, with very mild left basilar atelectasis and/or infiltrate. 2. Findings suspicious for a very small amount of free air below the right hemidiaphragm, likely related to the patient's recent surgical intervention. Electronically Signed   By: Virgina Norfolk M.D.   On: 02/24/2020 19:57   DG Swallowing Func-Speech Pathology  Result Date: 03/01/2020 Objective Swallowing Evaluation: Type of Study: MBS-Modified Barium Swallow Study  Patient Details Name: Brett [facility] MRN: [id number] Date of Birth: 18-Sep-1955 Today's Date: 03/01/2020 Time: SLP Start Time (ACUTE ONLY): 0930 -SLP Stop Time (ACUTE ONLY): 0950 SLP Time Calculation (min) (ACUTE ONLY): 20 min Past Medical History: Past Medical History: Diagnosis Date . Anemia  . Anemia  . Malignant neoplasm of lower third of esophagus ([place]) 11/24/2019 Past Surgical History: Past Surgical History: Procedure Laterality Date . BIOPSY  11/23/2019  Procedure: BIOPSY;  Surgeon: Arta Silence, MD;  Location: [facility];  Service: Endoscopy;; . ESOPHAGOGASTRODUODENOSCOPY N/A 02/24/2020  Procedure: ESOPHAGOGASTRODUODENOSCOPY (EGD);  Surgeon: Lajuana Matte, MD;  Location: [facility];  Service: Thoracic;  Laterality: N/A; . ESOPHAGOGASTRODUODENOSCOPY (EGD) WITH PROPOFOL N/A 11/23/2019  Procedure: ESOPHAGOGASTRODUODENOSCOPY (EGD) WITH PROPOFOL;   Surgeon: Arta Silence, MD;  Location: [facility];  Service: Endoscopy;  Laterality: N/A; . INTERCOSTAL NERVE BLOCK Right 02/24/2020  Procedure: INTERCOSTAL NERVE BLOCK;  Surgeon: Lajuana Matte, MD;  Location: [place];  Service: Thoracic;  Laterality: Right; . 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 Per EMR, he had lost approximately 60 pounds after diagnosis, but did not have much dysphagia. He presented on 10/21 for Ivor Lewis esophagectomy and J-tube placement.  No data recorded Assessment / Plan / Recommendation CHL IP CLINICAL IMPRESSIONS 03/01/2020 Clinical Impression Pt presents with mild pharyngeal dysphagia which is likely his baseline and did not impact safety. He exhibited reduced lingual retraction which resulted in vallecular residue with mechanical soft solids and regular textures. Pt independently demonstrated  1-2 dry secondary swallows which cleared residue. Transient penetration (PAS 2) of thin liquids was observed with the 13-mm barium tablet, but this is considered to be WNL. Pt may have up to regular texture solids and thin liquids from an oropharyngeal standpoint. However, per surgery's recommendation, he will be started on clear liquids at this time and further diet advancement will be deferred to surgery. SLP will follow briefly to ensure diet tolerance. SLP Visit Diagnosis Dysphagia, pharyngeal phase (R13.13) Attention and concentration deficit following -- Frontal lobe and executive function deficit following -- Impact on safety and function No limitations   CHL IP TREATMENT RECOMMENDATION 03/01/2020 Treatment Recommendations Therapy as outlined in treatment plan below   Prognosis 03/01/2020 Prognosis for Safe Diet Advancement Good Barriers to Reach Goals -- Barriers/Prognosis Comment -- CHL IP DIET RECOMMENDATION  03/01/2020 SLP Diet Recommendations Thin liquid with further advancement at surgery's discretion Liquid Administration via Cup;Straw;Spoon Medication Administration Via alternative means Compensations Slow rate Postural Changes Remain semi-upright after after feeds/meals (Comment);Seated upright at 90 degrees   CHL IP OTHER RECOMMENDATIONS 03/01/2020 Recommended Consults -- Oral Care Recommendations Oral care BID Other Recommendations --   CHL IP FOLLOW UP RECOMMENDATIONS 03/01/2020 Follow up Recommendations None   CHL IP FREQUENCY AND DURATION 03/01/2020 Speech Therapy Frequency (ACUTE ONLY) min 2x/week Treatment Duration 1 week      CHL IP ORAL PHASE 03/01/2020 Oral Phase WFL Oral - Pudding Teaspoon -- Oral - Pudding Cup -- Oral - Honey Teaspoon -- Oral - Honey Cup -- Oral - Nectar Teaspoon -- Oral - Nectar Cup -- Oral - Nectar Straw -- Oral - Thin Teaspoon -- Oral - Thin Cup -- Oral - Thin Straw -- Oral - Puree -- Oral - Mech Soft -- Oral - Regular -- Oral - Multi-Consistency -- Oral - Pill -- Oral Phase - Comment --  CHL IP PHARYNGEAL PHASE 03/01/2020 Pharyngeal Phase Impaired Pharyngeal- Pudding Teaspoon -- Pharyngeal -- Pharyngeal- Pudding Cup -- Pharyngeal -- Pharyngeal- Honey Teaspoon -- Pharyngeal -- Pharyngeal- Honey Cup -- Pharyngeal -- Pharyngeal- Nectar Teaspoon -- Pharyngeal -- Pharyngeal- Nectar Cup -- Pharyngeal -- Pharyngeal- Nectar Straw -- Pharyngeal -- Pharyngeal- Thin Teaspoon -- Pharyngeal -- Pharyngeal- Thin Cup -- Pharyngeal -- Pharyngeal- Thin Straw -- Pharyngeal -- Pharyngeal- Puree WFL Pharyngeal -- Pharyngeal- Mechanical Soft Reduced tongue base retraction;Pharyngeal residue - valleculae Pharyngeal -- Pharyngeal- Regular Reduced tongue base retraction;Pharyngeal residue - valleculae Pharyngeal -- Pharyngeal- Multi-consistency -- Pharyngeal -- Pharyngeal- Pill Reduced tongue base retraction;Penetration/Aspiration during swallow Pharyngeal Material enters airway, remains ABOVE vocal cords  then ejected out Pharyngeal Comment --  CHL IP CERVICAL ESOPHAGEAL PHASE 03/01/2020 Cervical Esophageal Phase WFL Pudding Teaspoon -- Pudding Cup -- Honey Teaspoon -- Honey Cup -- Nectar Teaspoon -- Nectar Cup -- Nectar Straw -- Thin Teaspoon -- Thin Cup -- Thin Straw -- Puree -- Mechanical Soft -- Regular -- Multi-consistency -- Pill -- Cervical Esophageal Comment -- Shanika I. Hardin Negus, [place], [place] Office number [phone] Pager [phone] Horton Marshall 03/01/2020, 10:32 AM              DG ESOPHAGUS W SINGLE CM (SOL OR THIN BA)  Result Date: 03/06/2020 CLINICAL DATA:  Esophageal cancer with gastric pull-through and mid high anastomosis about the vertical level of the AP window. Pleural effusions. EXAM: ESOPHOGRAM/BARIUM SWALLOW TECHNIQUE: Single contrast examination was performed using 150 cc Omnipaque 300. FLUOROSCOPY TIME:  Fluoroscopy Time:  3 minutes, 48 seconds Radiation Exposure Index (if provided by the fluoroscopic device): 46.2 mGy Number  of Acquired Spot Images: 0 COMPARISON:  02/29/2020 FINDINGS: A right chest tube is in place. Gastric pull-through noted with mild irregularity of the lumen along the gastroesophageal staple line for example on series 19. While a free leak is not identified, there is a small outpouching along the anastomotic staple line for example on series 28 posteriorly. This is also shown on series 37. The stomach is nearly entirely in the chest, with the duodenal bulb just below the diaphragm. Contrast slowly extends into the duodenal bulb and descending duodenum. IMPRESSION: 1. Along the anastomotic site there is a small contained collection of contrast which could be from an ulceration or potentially a small contained perforation. However, I do not observe a frank leak of contrast or free spread of contrast from this region. Electronically Signed   By: Van Clines M.D.   On: 03/06/2020 11:10   DG ESOPHAGUS W SINGLE CM (SOL OR THIN  BA)  Result Date: 02/29/2020 CLINICAL DATA:  Postop day 5 esophagectomy for esophageal cancer. EXAM: ESOPHOGRAM/BARIUM SWALLOW TECHNIQUE: Single contrast examination was performed using 150 mL Omnipaque 300 p.o. FLUOROSCOPY TIME:  Fluoroscopy Time:  2 minutes 54 second Radiation Exposure Index (if provided by the fluoroscopic device): Number of Acquired Spot Images: 17 COMPARISON:  CT chest 11/30/2019 FINDINGS: The patient is able to stand for the study and swallow adequate amounts of contrast. Postop distal esophagectomy with gastric pull-through. No leak identified. No obstruction. Contrast flows through the pylorus without obstruction. There is an NG tube across the anastomosis with the tip in the body the stomach. IMPRESSION: Postop distal esophagectomy with gastric pull-through. No leak or obstruction identified. Normal emptying of the stomach into the duodenum. Electronically Signed   By: Franchot Gallo M.D.   On: 02/29/2020 10:00   US THORACENTESIS ASP PLEURAL SPACE W/IMG GUIDE  Result Date: 03/07/2020 INDICATION: Shortness of breath. Left-sided pleural effusion. Request for diagnostic and therapeutic thoracentesis. EXAM: ULTRASOUND GUIDED LEFT THORACENTESIS MEDICATIONS: 1% plain lidocaine, 5 mL COMPLICATIONS: None immediate. PROCEDURE: An ultrasound guided thoracentesis was thoroughly discussed with the patient and questions answered. The benefits, risks, alternatives and complications were also discussed. The patient understands and wishes to proceed with the procedure. Written consent was obtained. Ultrasound of the left chest demonstrates partially loculated moderate-sized pleural effusion. Ultrasound was performed to localize and mark an adequate pocket of fluid in the left chest. The area was then prepped and draped in the normal sterile fashion. 1% Lidocaine was used for local anesthesia. Under ultrasound guidance a 6 Fr Safe-T-Centesis catheter was introduced. Thoracentesis was performed. The  catheter was removed and a dressing applied. FINDINGS: A total of approximately 1 L of hazy, amber colored fluid was removed. Samples were sent to the laboratory as requested by the clinical team. IMPRESSION: Successful ultrasound guided left thoracentesis yielding 1 L of pleural fluid. Read by: Ascencion Dike PA-C Electronically Signed   By: Corrie Mckusick D.O.   On: 03/05/2020 14:49    Labs:  CBC: Recent Labs    03/04/20 0216 03/05/20 0112 03/06/20 0451 03/07/20 0106  WBC 13.8* 15.8* 28.6* 22.1*  HGB 9.9* 9.8* 10.1* 9.6*  HCT 30.8* 29.9* 30.3* 29.7*  PLT 257 285 258 346    COAGS: Recent Labs    09/18/19 0808 02/22/20 0830  INR 1.2 1.0  APTT  --  30    BMP: Recent Labs    12/27/19 0810 12/27/19 0810 01/03/20 0813 01/03/20 0813 01/11/20 9935 01/11/20 0814 01/26/20 1426 02/22/20 0830 03/03/20 0941 03/05/20  0112 03/06/20 0451 03/07/20 0106  NA 136   < > 140   < > 139   < > 140   < > 133* 135 130* 135  K 4.0   < > 3.9   < > 4.2   < > 4.2   < > 4.4 3.9 4.2 4.2  CL 107   < > 106   < > 105   < > 106   < > 99 101 97* 100  CO2 23   < > 27   < > 29   < > 29   < > 26 25 23 26   GLUCOSE 117*   < > 127*   < > 105*   < > 99   < > 168* 113* 168* 138*  BUN 14   < > 12   < > 11   < > 10   < > 13 14 16 19   CALCIUM 9.4   < > 9.4   < > 9.0   < > 8.8*   < > 8.0* 8.0* 7.6* 8.1*  CREATININE 0.66   < > 0.63   < > 0.68   < > 0.71   < > 0.64 0.72 0.78 0.83  GFRNONAA >60   < > >60   < > >60   < > >60   < > >60 >60 >60 >60  GFRAA >60  --  >60  --  >60  --  >60  --   --   --   --   --    < > = values in this interval not displayed.    LIVER FUNCTION TESTS: Recent Labs    01/03/20 0813 01/11/20 0814 01/26/20 1426 02/22/20 0830  BILITOT 0.2* <0.2* 0.3 0.5  AST 29 37 29 29  ALT 34 49* 21 19  ALKPHOS 64 71 96 73  PROT 6.1* 6.4* 6.3* 6.8  ALBUMIN 3.1* 3.1* 3.1* 3.7    TUMOR MARKERS: No results for input(s): AFPTM, CEA, CA199, CHROMGRNA in the last 8760 hours.  Assessment and  Plan:  Loculated left pleural effusion: [name], 64 year old male, is tentatively scheduled for a left chest tube 03/08/20 at the [facility]. He currently has a left chest tube but imaging shows it is not communicating with a posterior fluid collection.   Risks and benefits discussed with the patient including bleeding, infection, damage to adjacent structures, bowel perforation/fistula connection, and sepsis.  All of the patient's questions were answered, patient is agreeable to proceed.  The patient will be NPO/Tube feeds held at midnight. AM labs ordered. Last dose of Lovenox 03/07/20 at 0911  Consent signed and in chart.  Thank you for this interesting consult.  I greatly enjoyed meeting SKYLOR SCHNAPP and look forward to participating in their care.  A copy of this report was sent to the requesting provider on this date.  Electronically Signed: Soyla Dryer, AGACNP-BC [phone] 03/07/2020, 4:53 PM   I spent a total of 20 Minutes    in face to face in clinical consultation, greater than 50% of which was counseling/coordinating care for left chest tube.

## 2020-03-07 NOTE — Patient Outreach (Signed)
[facility]) Care Management  03/07/2020  Brett Dawson 16-Feb-1956 [id number]   [place] coordination- note hospitalization  Pt with scheduled admission for robotic assisted Ivor-Lewis esophagectomy with jejunostomy tube placement by Dr Kipp Brood - 02/24/20  12th day post op Remains hospitalized- fevers, low grade tachycardia, on oxygen, Thoracentesis by IR on 03/05/20, continues iv antibiotics continue therapies  Plan [facility] RN CM will follow up with patient within after updated by [facility] hospital liaison of discharge   [place]. Lavina Hamman, RN, BSN, [place] Coordinator Office number [phone] Mobile number [phone]  Main [facility] number [phone] Fax number [phone]

## 2020-03-07 NOTE — Plan of Care (Signed)
  Problem: Education: Goal: Knowledge of General Education information will improve Description: Including pain rating scale, medication(s)/side effects and non-pharmacologic comfort measures Outcome: Progressing   Problem: Health Behavior/Discharge Planning: Goal: Ability to manage health-related needs will improve Outcome: Progressing   Problem: Clinical Measurements: Goal: Ability to maintain clinical measurements within normal limits will improve Outcome: Progressing Goal: Cardiovascular complication will be avoided Outcome: Progressing   Problem: Nutrition: Goal: Adequate nutrition will be maintained Outcome: Progressing   Problem: Coping: Goal: Level of anxiety will decrease Outcome: Progressing   Problem: Elimination: Goal: Will not experience complications related to bowel motility Outcome: Progressing Goal: Will not experience complications related to urinary retention Outcome: Progressing

## 2020-03-07 NOTE — Progress Notes (Signed)
Nutrition Follow-up  DOCUMENTATION CODES:   Non-severe (moderate) malnutrition in context of chronic illness  INTERVENTION:   Continue nocturnal tube feeds via J-tube: - Osmolite 1.5 @ 95 ml/hr x 16 hours from 1800 to 1000 (total of 1520 ml) - Add ProSource TF 45 ml daily - Free water flushes of 100 ml q 4 hours  Nocturnal tube feeding regimen provides 2320kcal (99% of kcal needs), 106grams of protein (96% of protein needs), and 1145m of H2O.  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  Ongoing  GOAL:   Patient will meet greater than or equal to 90% of their needs  Met via TF  MONITOR:   Diet advancement, Labs, Weight trends, Skin, I & O's  REASON FOR ASSESSMENT:   Consult Enteral/tube feeding initiation and management  ASSESSMENT:   Brett Dawson who presented on 10/21 for Ivor Lewis esophagectomy and J-tube placement. 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  10/22 - TF initiated 10/26 - swallow study, no evidence of leak, NGT removed 10/27 - MBS, diet advanced to clears, transitioned to nocturnal TF 10/31 - s/p thoracentesis with 1 L fluid removed 11/01 - chest tube placed, NPO  Spoke with pt at bedside. Pt tolerating TF without issues. Pt denies any abdominal pain or distention. Pt reports that he is having BMs and had a loose BM earlier today. Will continue with current nocturnal TF regimen per discussion with MD.  Current TF: Osmolite 1.5 @ 95 ml/hr x 16 hours, free water flushes of 100 ml q 4 hours  Admit weight: 99.6 kg Current weight: 95.5  kg  Medications reviewed and include: IV lasix once, SSI q 4 hours, IV protonix, IV abx  Labs reviewed: hemoglobin 9.6 CBG's: 90-150 x 24 hours  UOP: 950 ml x 24 hours CT: 330 ml x 24 hours I/O's: +5.3 L since admit  Diet Order:   Diet Order            Diet NPO time specified  Diet effective now                 EDUCATION NEEDS:   Education needs have been addressed  Skin:  Skin Assessment: Skin Integrity Issues: Incisions: chest, adomen  Last BM:  03/04/20  Height:   Ht Readings from Last 1 Encounters:  02/24/20 _0  (1.803 m)    Weight:   Wt Readings from Last 1 Encounters:  03/07/20 95.5 kg    Ideal Body Weight:  78.2 kg  BMI:  Body mass index is 29.37 kg/m.  Estimated Nutritional Needs:   Kcal:  [address] Protein:  110-130 grams  Fluid:  >/= 2.0 L    KGaynell Face MS, RD, LDN Inpatient Clinical Dietitian Please see AMiON for contact information.

## 2020-03-08 ENCOUNTER — Inpatient Hospital Stay (HOSPITAL_COMMUNITY): Payer: 59

## 2020-03-08 ENCOUNTER — Encounter (HOSPITAL_COMMUNITY): Payer: Self-pay | Admitting: Thoracic Surgery (Cardiothoracic Vascular Surgery)

## 2020-03-08 LAB — CBC
HCT: 29 % — ABNORMAL LOW (ref 39.0–52.0)
Hemoglobin: 9.5 g/dL — ABNORMAL LOW (ref 13.0–17.0)
MCH: 30.4 pg (ref 26.0–34.0)
MCHC: 32.8 g/dL (ref 30.0–36.0)
MCV: 92.9 fL (ref 80.0–100.0)
Platelets: 355 10*3/uL (ref 150–400)
RBC: 3.12 MIL/uL — ABNORMAL LOW (ref 4.22–5.81)
RDW: 18.5 % — ABNORMAL HIGH (ref 11.5–15.5)
WBC: 12.6 10*3/uL — ABNORMAL HIGH (ref 4.0–10.5)
nRBC: 0 % (ref 0.0–0.2)

## 2020-03-08 LAB — BASIC METABOLIC PANEL
Anion gap: 9 (ref 5–15)
BUN: 18 mg/dL (ref 8–23)
CO2: 28 mmol/L (ref 22–32)
Calcium: 7.8 mg/dL — ABNORMAL LOW (ref 8.9–10.3)
Chloride: 99 mmol/L (ref 98–111)
Creatinine, Ser: 0.72 mg/dL (ref 0.61–1.24)
GFR, Estimated: >60 mL/min (ref >60)
Glucose, Bld: 109 mg/dL — ABNORMAL HIGH (ref 70–99)
Potassium: 3.7 mmol/L (ref 3.5–5.1)
Sodium: 136 mmol/L (ref 135–145)

## 2020-03-08 LAB — PROTIME-INR
INR: 1.1 (ref 0.8–1.2)
Prothrombin Time: 14 seconds (ref 11.4–15.2)

## 2020-03-08 LAB — GLUCOSE, CAPILLARY
Glucose-Capillary: 101 mg/dL — ABNORMAL HIGH (ref 70–99)
Glucose-Capillary: 119 mg/dL — ABNORMAL HIGH (ref 70–99)
Glucose-Capillary: 92 mg/dL (ref 70–99)
Glucose-Capillary: 95 mg/dL (ref 70–99)
Glucose-Capillary: 95 mg/dL (ref 70–99)
Glucose-Capillary: 98 mg/dL (ref 70–99)
Glucose-Capillary: 99 mg/dL (ref 70–99)

## 2020-03-08 MED ORDER — MIDAZOLAM HCL 2 MG/2ML IJ SOLN
INTRAMUSCULAR | Status: AC
  Filled 2020-03-08: qty 2

## 2020-03-08 MED ORDER — MIDAZOLAM HCL 2 MG/2ML IJ SOLN
INTRAMUSCULAR | Status: AC | PRN
  Administered 2020-03-08: 1 mg via INTRAVENOUS

## 2020-03-08 MED ORDER — FENTANYL CITRATE (PF) 100 MCG/2ML IJ SOLN
INTRAMUSCULAR | Status: AC
  Filled 2020-03-08: qty 2

## 2020-03-08 MED ORDER — FENTANYL CITRATE (PF) 100 MCG/2ML IJ SOLN
INTRAMUSCULAR | Status: AC | PRN
Start: 2020-03-08 — End: 2020-03-08
  Administered 2020-03-08: 50 ug via INTRAVENOUS

## 2020-03-08 MED ORDER — ENOXAPARIN SODIUM 40 MG/0.4ML ~~LOC~~ SOLN
40.0000 mg | SUBCUTANEOUS | Status: DC
  Administered 2020-03-08 – 2020-04-20 (×42): 40 mg via SUBCUTANEOUS
  Filled 2020-03-08 (×40): qty 0.4

## 2020-03-08 MED ORDER — GERHARDT'S BUTT CREAM
TOPICAL_CREAM | Freq: Every day | CUTANEOUS | Status: DC
  Filled 2020-03-08: qty 1

## 2020-03-08 NOTE — Procedures (Signed)
Interventional Radiology Procedure Note  Procedure: 70F chest tube, left side  Complications: None  Estimated Blood Loss: None  Recommendations: - Tube to suction at -20 cm H2O   Signed,  Criselda Peaches, MD

## 2020-03-08 NOTE — Progress Notes (Addendum)
[address]       [place] [address]             [phone]      13 Days Post-Op Procedure(s) (LRB): XI ROBOTIC ASSISTED IVOR LEWIS ESOPHAGECTOMY (N/A) LAPARSCOPIC  JEJUNOSTOMY TUBE PLACEMENT (N/A) ESOPHAGOGASTRODUODENOSCOPY (EGD) (N/A) INTERCOSTAL NERVE BLOCK (Right) Subjective:  Awake and alert. No new concerns this morning except for cough.  TF on hold for IR procedure this morning.   Objective: Vital signs in last 24 hours: Temp:  [98.3 F (36.8 C)-101.5 F (38.6 C)] 99 F (37.2 C) (11/03 0430) Pulse Rate:  [81-90] 81 (11/03 0430) Cardiac Rhythm: Normal sinus rhythm (11/03 0430) Resp:  [17-21] 17 (11/03 0430) BP: (118-146)/(52-93) 127/65 (11/03 0430) SpO2:  [90 %-99 %] 94 % (11/03 0430) Weight:  [95.1 kg] 95.1 kg (11/03 0430)  Intake/Output from previous day: 11/02 0701 - 11/03 0700 In: -  Out: 340 [Urine:300; Chest Tube:40] Intake/Output this shift: No intake/output data recorded.  General appearance: alert, cooperative and and no distress Heart: RRR Lungs: coarse, non-productive cough. Breath sounds clear, diminished.  A left pleural pigtail catheter is secure, drainage only 60ml past 24 hours.  Abdomen: soft, non tender except near the J-tube exit site which is inflammed and tender.  Extremities: no edema Wound: incis healing well  Lab Results: Recent Labs    03/07/20 0106 03/08/20 0015  WBC 22.1* 12.6*  HGB 9.6* 9.5*  HCT 29.7* 29.0*  PLT 346 355   BMET:  Recent Labs    03/07/20 0106 03/08/20 0015  NA 135 136  K 4.2 3.7  CL 100 99  CO2 26 28  GLUCOSE 138* 109*  BUN 19 18  CREATININE 0.83 0.72  CALCIUM 8.1* 7.8*    PT/INR:  Recent Labs    03/08/20 0015  LABPROT 14.0  INR 1.1   ABG    Component Value Date/Time   PHART 7.454 (H) 02/25/2020 0500   HCO3 26.5 02/25/2020 0500   TCO2 27 02/24/2020 1944   ACIDBASEDEF 1.0 02/24/2020 1547   O2SAT 93.7 02/25/2020 0500   CBG (last 3)  Recent Labs     03/07/20 1944 03/08/20 0008 03/08/20 0431  GLUCAP 118* 92 99    Meds Scheduled Meds: . enoxaparin (LOVENOX) injection  40 mg Subcutaneous Q24H  . feeding supplement (OSMOLITE 1.5 CAL)  1,520 mL Per Tube Q24H  . feeding supplement (PROSource TF)  45 mL Per Tube Daily  . free water  100 mL Per Tube Q4H  . insulin aspart  0-24 Units Subcutaneous Q4H  . pantoprazole (PROTONIX) IV  40 mg Intravenous Q12H  . silver sulfADIAZINE   Topical BID  . sorbitol  30 mL Per Tube Once   Continuous Infusions: . piperacillin-tazobactam (ZOSYN)  IV 3.375 g (03/08/20 0627)  . vancomycin 1,750 mg (03/08/20 0147)   PRN Meds:.acetaminophen (TYLENOL) oral liquid 160 mg/5 mL, guaiFENesin-dextromethorphan, levalbuterol, morphine injection, ondansetron (ZOFRAN) IV  Xrays CT CHEST W CONTRAST  Result Date: 03/07/2020 CLINICAL DATA:  Shortness of breath and hypoxia. Bilateral chest tubes. EXAM: CT CHEST WITH CONTRAST TECHNIQUE: Multidetector CT imaging of the chest was performed during intravenous contrast administration. CONTRAST:  40mL OMNIPAQUE IOHEXOL 300 MG/ML  SOLN COMPARISON:  Radiography same day. FINDINGS: Cardiovascular: Heart size is normal.  No pericardial fluid. Mediastinum/Nodes: Previous esophagectomy and pull-through surgery. Lungs/Pleura: On the right, there is a small amount of pleural fluid layering dependently and there is mild-to-moderate dependent pulmonary atelectasis. Right chest tube  is in place. No pleural air. Chest tube is in communication with the residual pleural density. On the left, there is large pleural fluid collection posteriorly which appears loculated with air bubbles and air-fluid level consistent with empyema. The adjacent dependent left lung shows atelectasis. The left-sided chest tube is not visibly in communication with the loculated collection. No free pleural air. 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 Multiple renal cysts  as seen previously. Musculoskeletal: Ordinary thoracic degenerative changes. IMPRESSION: 1. Previous esophagectomy and pull-through surgery. 2. Large loculated pleural fluid collection posteriorly on the left with air bubbles and air-fluid level consistent with empyema. The left-sided chest tube is not visibly in communication with the loculated collection. 3. Small amount of pleural fluid on the right with dependent pulmonary atelectasis. 4. 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 Electronically Signed   By: Nelson Chimes M.D.   On: 03/07/2020 16:26   DG CHEST PORT 1 VIEW  Result Date: 03/07/2020 CLINICAL DATA:  Shortness of breath.  Hypoxia. EXAM: PORTABLE CHEST 1 VIEW COMPARISON:  05/2019, 03/05/2020. FINDINGS: Right chest tube in stable position. No pneumothorax. Persistent large left pleural effusion. Underlying atelectasis/infiltrate cannot be excluded. Right base pleural-parenchymal thickening/postsurgical change again noted. Mild right upper lung infiltrate noted on today's exam. Small right pleural effusion again noted. Heart size stable. IMPRESSION: 1. Right chest tube in stable position. 2. No pneumothorax. Persistent large left pleural effusion. Underlying atelectasis/infiltrate cannot be excluded. No interim change in appearance. 3. Mild right upper lung infiltrate noted on today's exam. Right base pleural-parenchymal thickening/postsurgical change again noted. Small right pleural effusion again noted. Electronically Signed   By: Marcello Moores  Register   On: 03/07/2020 08:49   DG CHEST PORT 1 VIEW  Result Date: 03/06/2020 CLINICAL DATA:  Chest tube in place. EXAM: PORTABLE CHEST 1 VIEW COMPARISON:  Same day. FINDINGS: Stable cardiomediastinal silhouette. Right-sided chest tube is unchanged with no pneumothorax present. Stable moderate left pleural effusion is noted with associated atelectasis or pneumonia. Bony thorax is unremarkable. IMPRESSION: Stable right-sided  chest tube with no pneumothorax present. Stable moderate left pleural effusion with associated atelectasis or pneumonia. Electronically Signed   By: Marijo Conception M.D.   On: 03/06/2020 09:22   DG ESOPHAGUS W SINGLE CM (SOL OR THIN BA)  Result Date: 03/06/2020 CLINICAL DATA:  Esophageal cancer with gastric pull-through and mid high anastomosis about the vertical level of the AP window. Pleural effusions. EXAM: ESOPHOGRAM/BARIUM SWALLOW TECHNIQUE: Single contrast examination was performed using 150 cc Omnipaque 300. FLUOROSCOPY TIME:  Fluoroscopy Time:  3 minutes, 48 seconds Radiation Exposure Index (if provided by the fluoroscopic device): 46.2 mGy Number of Acquired Spot Images: 0 COMPARISON:  02/29/2020 FINDINGS: A right chest tube is in place. Gastric pull-through noted with mild irregularity of the lumen along the gastroesophageal staple line for example on series 19. While a free leak is not identified, there is a small outpouching along the anastomotic staple line for example on series 28 posteriorly. This is also shown on series 37. The stomach is nearly entirely in the chest, with the duodenal bulb just below the diaphragm. Contrast slowly extends into the duodenal bulb and descending duodenum. IMPRESSION: 1. Along the anastomotic site there is a small contained collection of contrast which could be from an ulceration or potentially a small contained perforation. However, I do not observe a frank leak of contrast or free spread of contrast from this region. Electronically Signed  By: Van Clines M.D.   On: 03/06/2020 11:10   Results for orders placed or performed during the hospital encounter of 02/24/20  Respiratory Panel by PCR     Status: None   Collection Time: 03/05/20 10:11 AM   Specimen: Nasopharyngeal Swab; Respiratory  Result Value Ref Range Status   Adenovirus NOT DETECTED NOT DETECTED Final   Coronavirus 229E NOT DETECTED NOT DETECTED Final    Comment: (NOTE) The Coronavirus  on the Respiratory Panel, DOES NOT test for the novel  Coronavirus (2019 nCoV)    Coronavirus HKU1 NOT DETECTED NOT DETECTED Final   Coronavirus NL63 NOT DETECTED NOT DETECTED Final   Coronavirus OC43 NOT DETECTED NOT DETECTED Final   Metapneumovirus NOT DETECTED NOT DETECTED Final   Rhinovirus / Enterovirus NOT DETECTED NOT DETECTED Final   Influenza A NOT DETECTED NOT DETECTED Final   Influenza B NOT DETECTED NOT DETECTED Final   Parainfluenza Virus 1 NOT DETECTED NOT DETECTED Final   Parainfluenza Virus 2 NOT DETECTED NOT DETECTED Final   Parainfluenza Virus 3 NOT DETECTED NOT DETECTED Final   Parainfluenza Virus 4 NOT DETECTED NOT DETECTED Final   Respiratory Syncytial Virus NOT DETECTED NOT DETECTED Final   Bordetella pertussis NOT DETECTED NOT DETECTED Final   Chlamydophila pneumoniae NOT DETECTED NOT DETECTED Final   Mycoplasma pneumoniae NOT DETECTED NOT DETECTED Final    Comment: Performed at [facility], [place]. [address] [place], [place] [address]  Gram stain     Status: None   Collection Time: 03/05/20  2:20 PM   Specimen: PATH Cytology Pleural fluid  Result Value Ref Range Status   Specimen Description PLEURAL FLUID  Final   Special Requests LEFT CHEST  Final   Gram Stain   Final    WBC PRESENT, PREDOMINANTLY PMN GRAM POSITIVE COCCI IN PAIRS CYTOSPIN SMEAR Performed at [facility], [place] [address] [place], [place] [address]    Report Status 03/05/2020 FINAL  Final  Culture, body fluid-bottle     Status: None (Preliminary result)   Collection Time: 03/05/20  2:20 PM   Specimen: Pleura  Result Value Ref Range Status   Specimen Description PLEURAL FLUID  Final   Special Requests LEFT CHEST  Final   Culture   Final    NO GROWTH 3 DAYS Performed at [facility] [address], [place], [place] [address]    Report Status PENDING  Incomplete  Urine culture     Status: None   Collection Time: 03/05/20  3:48 PM   Specimen: Urine, Clean Catch   Result Value Ref Range Status   Specimen Description URINE, CLEAN CATCH  Final   Special Requests Normal  Final   Culture   Final    NO GROWTH Performed at [facility], [place] [address], [place], [place] [address]    Report Status 03/06/2020 FINAL  Final  Culture, blood (Routine X 2) w Reflex to ID Panel     Status: None (Preliminary result)   Collection Time: 03/05/20  4:32 PM   Specimen: BLOOD  Result Value Ref Range Status   Specimen Description BLOOD BLOOD RIGHT HAND  Final   Special Requests   Final    BOTTLES DRAWN AEROBIC ONLY Blood Culture adequate volume   Culture   Final    NO GROWTH 3 DAYS Performed at [facility], [place] [address], [place], [place] [address]    Report Status PENDING  Incomplete  Culture,  blood (Routine X 2) w Reflex to ID Panel     Status: None (Preliminary result)   Collection Time: 03/05/20  4:32 PM   Specimen: BLOOD  Result Value Ref Range Status   Specimen Description BLOOD BLOOD LEFT HAND  Final   Special Requests   Final    BOTTLES DRAWN AEROBIC ONLY Blood Culture results may not be optimal due to an inadequate volume of blood received in culture bottles   Culture   Final    NO GROWTH 3 DAYS Performed at [facility], [place] [address] [place], [place] [address]    Report Status PENDING  Incomplete   Assessment/Plan: S/P Procedure(s) (LRB): XI ROBOTIC ASSISTED IVOR LEWIS ESOPHAGECTOMY (N/A) LAPARSCOPIC  JEJUNOSTOMY TUBE PLACEMENT (N/A) ESOPHAGOGASTRODUODENOSCOPY (EGD) (N/A) INTERCOSTAL NERVE BLOCK (Right)     -POD-13 Ivor-Lewis esophagectomy and 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 Esophagram early post-op and repeat study 03/06/20 show no anatsamotic leak and free flow of contrast through the pylorus. Continuing nutritional support with nocturnal J-tube feedings. Keep NPO for now.   -Fever / leukocytosis- respiratory viral panel negative. Temp curve and WBC are trending down. On day 4 Vanc /  Zosyn.  No clear source except for some cellulitis at the J-tube exit site. Blood Cx negative at 2 days. Continue empiric treatment.   -Left pleural effusion- POD 3 thoracentesis by IR yielding 1L "hazy amber" fluid. Gram stain on pleural fluid showed Gm+ cocci in pairs, but Cx shows no growth at 2 days. The post thoracentesis CXR showed persistent  Left pleural fluid / volume loss.  A left pleural pigtail catheter was placed at the bedside on 03/06/20 and has drained 380ml since insertion. CT chest on 11/2 demonstrated a large posterior left pleural effusion and showed the pigtail catheter does not communicate with this collection. IR planning image-guided drainage of the left posterior effusion.   -DVT PPX- add enoxaparin daily.   -Hyperglycemia- mild, related to TF. No H/O DM. Still requires occasional insulin coverage so will continue the SSI.    [name] PA-C Pager [phone] 03/08/2020  Low grade fevers WBC trending down Will get CT guided drain today for posterior effusion  Twisha Vanpelt O Yechiel Erny

## 2020-03-08 NOTE — Progress Notes (Signed)
Pharmacy Antibiotic Note  Brett Dawson is a 64 y.o. male admitted on 02/24/2020 s/p esophagectomy with fevers and concern for PNA. Pt started on vancomycin and Zosyn. Cr stable, WBC down significantly, BCx NGTD, febrile 11/2.   Plan: Vancomycin 1750mg  q12h Zosyn 3.375GM iv q8h  Height: 5\' 11"  (180.3 cm) Weight: 95.1 kg (209 lb 10.5 oz) IBW/kg (Calculated) : 75.3  Temp (24hrs), Avg:99.6 F (37.6 C), Min:98.3 F (36.8 C), Max:101.2 F (38.4 C)  Recent Labs  Lab 03/03/20 0941 03/03/20 0941 03/04/20 0216 03/05/20 0112 03/06/20 0451 03/07/20 0106 03/08/20 0015  WBC 12.9*   < > 13.8* 15.8* 28.6* 22.1* 12.6*  CREATININE 0.64  --   --  0.72 0.78 0.83 0.72   < > = values in this interval not displayed.    Estimated Creatinine Clearance: 111.2 mL/min (by C-G formula based on SCr of 0.72 mg/dL).    No Known Allergies  Antimicrobials this admission: Cefoxitin periop 10/22 Cefazolin 10/30 Vanc 10/31> Zosyn 10/31>   Microbiology results: 10/31 Pleural fluid: pending 10/31 UCx: negative 10/31 BCx: NGTD  Arrie Senate, PharmD, BCPS Clinical Pharmacist [phone] Please check AMION for all [facility] numbers 03/08/2020

## 2020-03-08 NOTE — Progress Notes (Signed)
PT Cancellation Note  Patient Details Name: Brett Dawson MRN: [id number] DOB: 21-Sep-1955   Cancelled Treatment:    Reason Eval/Treat Not Completed: (P) Patient at procedure or test/unavailable Pt in IR for placement of chest tube. PT will follow back for treatment as able.   Dulse Rutan B. Migdalia Dk PT, DPT Acute Rehabilitation Services Pager [phone] Office [phone]    [place] 03/08/2020, 12:38 PM

## 2020-03-08 NOTE — Plan of Care (Signed)

## 2020-03-08 NOTE — Progress Notes (Signed)
After returning from IR, Patient by 1600 drained 300 cc of brownish drainage. Patient current VS 102.2, 127/70. 92, 94 % on 5L, and 19 RR. Patient given 650 mg of tylenol and Jadene Pierini, PA was notified. No new orders at this time, will continue to monitor.

## 2020-03-09 LAB — CBC
HCT: 29.6 % — ABNORMAL LOW (ref 39.0–52.0)
Hemoglobin: 9.4 g/dL — ABNORMAL LOW (ref 13.0–17.0)
MCH: 29.6 pg (ref 26.0–34.0)
MCHC: 31.8 g/dL (ref 30.0–36.0)
MCV: 93.1 fL (ref 80.0–100.0)
Platelets: 379 10*3/uL (ref 150–400)
RBC: 3.18 MIL/uL — ABNORMAL LOW (ref 4.22–5.81)
RDW: 18.6 % — ABNORMAL HIGH (ref 11.5–15.5)
WBC: 8.9 10*3/uL (ref 4.0–10.5)
nRBC: 0 % (ref 0.0–0.2)

## 2020-03-09 LAB — GLUCOSE, CAPILLARY
Glucose-Capillary: 113 mg/dL — ABNORMAL HIGH (ref 70–99)
Glucose-Capillary: 115 mg/dL — ABNORMAL HIGH (ref 70–99)
Glucose-Capillary: 128 mg/dL — ABNORMAL HIGH (ref 70–99)
Glucose-Capillary: 130 mg/dL — ABNORMAL HIGH (ref 70–99)
Glucose-Capillary: 140 mg/dL — ABNORMAL HIGH (ref 70–99)
Glucose-Capillary: 83 mg/dL (ref 70–99)

## 2020-03-09 LAB — BASIC METABOLIC PANEL
Anion gap: 10 (ref 5–15)
BUN: 14 mg/dL (ref 8–23)
CO2: 27 mmol/L (ref 22–32)
Calcium: 7.8 mg/dL — ABNORMAL LOW (ref 8.9–10.3)
Chloride: 99 mmol/L (ref 98–111)
Creatinine, Ser: 0.77 mg/dL (ref 0.61–1.24)
GFR, Estimated: >60 mL/min (ref >60)
Glucose, Bld: 119 mg/dL — ABNORMAL HIGH (ref 70–99)
Potassium: 4 mmol/L (ref 3.5–5.1)
Sodium: 136 mmol/L (ref 135–145)

## 2020-03-09 MED ORDER — BOOST / RESOURCE BREEZE PO LIQD CUSTOM
1.0000 | Freq: Three times a day (TID) | ORAL | Status: DC
  Administered 2020-03-09 – 2020-03-13 (×9): 1 via ORAL

## 2020-03-09 NOTE — Progress Notes (Signed)
Physical Therapy Treatment Patient Details Name: Brett Dawson MRN: [id number] DOB: [birth year] Today's Date: 03/09/2020    History of Present Illness 64 yo male with Esophageal cancer. s/p 02/24/20 robotic assisted Ivor-Lewis esophagectomy with jejunostomy tube placement. Pt with increasing shortness of breath and fever with chest tubes placed x 2 (11/1 and 11/2)    PT Comments    Pt is finally feeling better with placement of second chest tube, however needs maximal encouragement to ambulate with therapy. Pt is min Ax2 for bed mobility, min guard for transfers and min Ax2 for safety with ambulation of 100 feet with RW. Pt with increased work of breathing at end of ambulation however able to keep SaO2 >90%O2 with ambulation. PT will continue to see pt acutely, given increased medical need may need to have d/c plans changed to SNF.   Follow Up Recommendations  Home health PT;Supervision/Assistance - 24 hour     Equipment Recommendations  None recommended by PT       Precautions / Restrictions Precautions Precautions: Fall;ICD/Pacemaker Precaution Comments: chest tubes x 2, watch 02 Restrictions Weight Bearing Restrictions: No    Mobility  Bed Mobility Overal bed mobility: Needs Assistance Bed Mobility: Supine to Sit     Supine to sit: +2 for physical assistance;Min assist     General bed mobility comments: assist to pivot hips and raise trunk to sit at EOB  Transfers Overall transfer level: Needs assistance Equipment used: Rolling walker (2 wheeled) Transfers: Sit to/from Stand Sit to Stand: Min guard;+2 safety/equipment         General transfer comment: increased time and effort, pulled up on RW  Ambulation/Gait Ambulation/Gait assistance: Min assist Gait Distance (Feet): 100 Feet Assistive device: Rolling walker (2 wheeled) Gait Pattern/deviations: Step-through pattern;Shuffle;Narrow base of support Gait velocity: slowed Gait velocity interpretation: <1.31  ft/sec, indicative of household ambulator General Gait Details: light min A for steadying       Balance Overall balance assessment: Needs assistance   Sitting balance-Leahy Scale: Good     Standing balance support: Bilateral upper extremity supported Standing balance-Leahy Scale: Poor                              Cognition Arousal/Alertness: Awake/alert Behavior During Therapy: WFL for tasks assessed/performed Overall Cognitive Status: Within Functional Limits for tasks assessed                                           General Comments General comments (skin integrity, edema, etc.): pt on 6L 02 for ambulation with Sp02 of 93%      Pertinent Vitals/Pain Pain Assessment: Faces Faces Pain Scale: Hurts little more Pain Location: chest tube sites Pain Descriptors / Indicators: Discomfort Pain Intervention(s): Monitored during session;Limited activity within patient's tolerance;Repositioned           PT Goals (current goals can now be found in the care plan section) Acute Rehab PT Goals Patient Stated Goal: get back to work PT Goal Formulation: With patient Time For Goal Achievement: 03/14/20 Potential to Achieve Goals: Good Progress towards PT goals: Progressing toward goals    Frequency    Min 3X/week      PT Plan Current plan remains appropriate    Co-evaluation PT/OT/SLP Co-Evaluation/Treatment: Yes Reason for Co-Treatment: For patient/therapist safety PT goals addressed during session: Mobility/safety with mobility  OT goals addressed during session: ADL's and self-care;Proper use of Adaptive equipment and DME      AM-PAC PT "6 Clicks" Mobility   Outcome Measure  Help needed turning from your back to your side while in a flat bed without using bedrails?: None Help needed moving from lying on your back to sitting on the side of a flat bed without using bedrails?: A Little Help needed moving to and from a bed to a chair  (including a wheelchair)?: A Little Help needed standing up from a chair using your arms (e.g., wheelchair or bedside chair)?: A Little Help needed to walk in hospital room?: A Little Help needed climbing 3-5 steps with a railing? : A Little 6 Click Score: 19    End of Session Equipment Utilized During Treatment: Gait belt Activity Tolerance: Patient tolerated treatment well Patient left: in chair;with call bell/phone within reach Nurse Communication: Mobility status PT Visit Diagnosis: Other abnormalities of gait and mobility (R26.89)     Time: 9562-1308 PT Time Calculation (min) (ACUTE ONLY): 38 min  Charges:  $Gait Training: 8-22 mins                     Deniqua Perry B. [name] PT, DPT Acute Rehabilitation Services Pager [phone] Office [phone]    [name] 03/09/2020, 2:42 PM

## 2020-03-09 NOTE — Progress Notes (Signed)
Referring Physician(s): Dr. Kipp Brood  Supervising Physician: Daryll Brod  Patient Status:  [facility] - In-pt  Chief Complaint: Posterior left empyema; s/p left chest tube placement 03/08/20 by Dr. Laurence Ferrari  Subjective: Patient sitting up in chair -  He just finished working with physical therapy. Relaxed work of breathing. He states he feels better.   Allergies: Patient has no known allergies.  Medications: Prior to Admission medications   Medication Sig Start Date End Date Taking? Authorizing Provider  Colchicine 0.6 MG CAPS Take 0.6 mg by mouth 2 (two) times daily as needed (gout).  11/02/19  Yes [provider]  FEROSUL 325 (65 Fe) MG tablet Take 1 tablet (325 mg total) by mouth daily. Patient not taking: Reported on 02/21/2020 12/13/19   [name], MD  ondansetron (ZOFRAN) 8 MG tablet Take 1 tablet (8 mg total) by mouth 2 (two) times daily as needed for refractory nausea / vomiting. Start on day 3 after chemo. 11/27/19   [name], MD  prochlorperazine (COMPAZINE) 10 MG tablet TAKE 1 TABLET(10 MG) BY MOUTH EVERY 6 HOURS AS NEEDED FOR NAUSEA OR VOMITING Patient taking differently: Take 10 mg by mouth every 6 (six) hours as needed for nausea or vomiting.  12/13/19   [name], MD  sucralfate (CARAFATE) 1 g tablet Take 1 tablet (1 g total) by mouth 4 (four) times daily. Dissolve each tablet in 15 cc water before use. Patient taking differently: Take 1 g by mouth 4 (four) times daily as needed (throat pain). Dissolve each tablet in 15 cc water before use. 12/24/19   Kyung Rudd, MD     Vital Signs: BP 127/67 (BP Location: Right Arm)   Pulse 83   Temp 99.2 F (37.3 C) (Oral)   Resp 19   Ht 5\' 11"  (1.803 m)   Wt 182 lb 1.6 oz (82.6 kg)   SpO2 95%   BMI 25.40 kg/m   Physical Exam Constitutional:      General: He is not in acute distress. Pulmonary:     Effort: Pulmonary effort is normal.     Comments: Left chest tubes intact (one tube placed by TCTS, one tube placed by  IR). Chest tube labeled #2 with dark brown/purulent output. Chest tube #1 with serosanguineous output. Both tubes to suction. No air leaks identified.  Skin:    General: Skin is warm and dry.  Neurological:     Mental Status: He is alert and oriented to person, place, and time.     Imaging: DG Chest 2 View  Result Date: 03/06/2020 CLINICAL DATA:  Follow-up pneumonia EXAM: CHEST - 2 VIEW COMPARISON:  03/05/2020 FINDINGS: Unchanged position of right chest tube. No pneumothorax identified. Stable cardiomediastinal contours. Large left pleural effusion with veil like opacification of the left mid and left upper lung zones is similar in volume to 03/05/20. No new findings. IMPRESSION: 1. Stable right chest tube. No pneumothorax. 2. Unchanged appearance of left pleural effusion compared with 03/05/20 Electronically Signed   By: Kerby Moors M.D.   On: 03/06/2020 07:44   CT CHEST W CONTRAST  Result Date: 03/07/2020 CLINICAL DATA:  Shortness of breath and hypoxia. Bilateral chest tubes. EXAM: CT CHEST WITH CONTRAST TECHNIQUE: Multidetector CT imaging of the chest was performed during intravenous contrast administration. CONTRAST:  56mL OMNIPAQUE IOHEXOL 300 MG/ML  SOLN COMPARISON:  Radiography same day. FINDINGS: Cardiovascular: Heart size is normal.  No pericardial fluid. Mediastinum/Nodes: Previous esophagectomy and pull-through surgery. Lungs/Pleura: On the right, there is a  small amount of pleural fluid layering dependently and there is mild-to-moderate dependent pulmonary atelectasis. Right chest tube is in place. No pleural air. Chest tube is in communication with the residual pleural density. On the left, there is large pleural fluid collection posteriorly which appears loculated with air bubbles and air-fluid level consistent with empyema. The adjacent dependent left lung shows atelectasis. The left-sided chest tube is not visibly in communication with the loculated collection. No free pleural air.  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 Multiple renal cysts as seen previously. Musculoskeletal: Ordinary thoracic degenerative changes. IMPRESSION: 1. Previous esophagectomy and pull-through surgery. 2. Large loculated pleural fluid collection posteriorly on the left with air bubbles and air-fluid level consistent with empyema. The left-sided chest tube is not visibly in communication with the loculated collection. 3. Small amount of pleural fluid on the right with dependent pulmonary atelectasis. 4. 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 Electronically Signed   By: Nelson Chimes M.D.   On: 03/07/2020 16:26   DG CHEST PORT 1 VIEW  Result Date: 03/07/2020 CLINICAL DATA:  Shortness of breath.  Hypoxia. EXAM: PORTABLE CHEST 1 VIEW COMPARISON:  05/2019, 03/05/2020. FINDINGS: Right chest tube in stable position. No pneumothorax. Persistent large left pleural effusion. Underlying atelectasis/infiltrate cannot be excluded. Right base pleural-parenchymal thickening/postsurgical change again noted. Mild right upper lung infiltrate noted on today's exam. Small right pleural effusion again noted. Heart size stable. IMPRESSION: 1. Right chest tube in stable position. 2. No pneumothorax. Persistent large left pleural effusion. Underlying atelectasis/infiltrate cannot be excluded. No interim change in appearance. 3. Mild right upper lung infiltrate noted on today's exam. Right base pleural-parenchymal thickening/postsurgical change again noted. Small right pleural effusion again noted. Electronically Signed   By: Marcello Moores  Register   On: 03/07/2020 08:49   DG CHEST PORT 1 VIEW  Result Date: 03/06/2020 CLINICAL DATA:  Chest tube in place. EXAM: PORTABLE CHEST 1 VIEW COMPARISON:  Same day. FINDINGS: Stable cardiomediastinal silhouette. Right-sided chest tube is unchanged with no pneumothorax present. Stable moderate left pleural  effusion is noted with associated atelectasis or pneumonia. Bony thorax is unremarkable. IMPRESSION: Stable right-sided chest tube with no pneumothorax present. Stable moderate left pleural effusion with associated atelectasis or pneumonia. Electronically Signed   By: Marijo Conception M.D.   On: 03/06/2020 09:22   CT IMAGE GUIDED DRAINAGE BY PERCUTANEOUS CATHETER  Result Date: 03/08/2020 INDICATION: 64 year old male with large left hydropneumothorax status post esophagectomy and gastric pull-through procedure. He presents for CT-guided drain placement. EXAM: CT-guided chest tube MEDICATIONS: The patient is currently admitted to the hospital and receiving intravenous antibiotics. The antibiotics were administered within an appropriate time frame prior to the initiation of the procedure. ANESTHESIA/SEDATION: Fentanyl 50 mcg IV; Versed 1 mg IV Moderate Sedation Time:  16 minutes The patient was continuously monitored during the procedure by the interventional radiology nurse under my direct supervision. COMPLICATIONS: None immediate. PROCEDURE: Informed written consent was obtained from the patient after a thorough discussion of the procedural risks, benefits and alternatives. All questions were addressed. Maximal Sterile Barrier Technique was utilized including caps, mask, sterile gowns, sterile gloves, sterile drape, hand hygiene and skin antiseptic. A timeout was performed prior to the initiation of the procedure. A planning axial CT scan was performed. The large hydropneumothorax was identified in the left hemithorax. A suitable skin entry site was selected and marked. The overlying skin was sterilely prepped and draped in the standard fashion  using chlorhexidine skin prep. Local anesthesia was attained by infiltration with 1% lidocaine. A small dermatotomy was made. Under intermittent CT guidance, an 18 gauge trocar needle was advanced into the collection. A 0.035 wire was then advanced into the pleural space.  The needle was removed. The skin tract was dilated to 12 [place]. A Cook 12 [place] all-purpose drainage catheter was advanced over the wire and formed in the hydropneumothorax. The catheter was connected to a pleura vac and wall suction. Follow-up CT imaging demonstrates a well-positioned percutaneous chest tube and decreasing hydropneumothorax. The catheter was secured to the skin with 0 Prolene suture. Sterile bandages were applied. IMPRESSION: Successful placement of a left-sided 66 French percutaneous thoracostomy tube. Electronically Signed   By: Jacqulynn Cadet M.D.   On: 03/08/2020 16:27   DG ESOPHAGUS W SINGLE CM (SOL OR THIN BA)  Result Date: 03/06/2020 CLINICAL DATA:  Esophageal cancer with gastric pull-through and mid high anastomosis about the vertical level of the AP window. Pleural effusions. EXAM: ESOPHOGRAM/BARIUM SWALLOW TECHNIQUE: Single contrast examination was performed using 150 cc Omnipaque 300. FLUOROSCOPY TIME:  Fluoroscopy Time:  3 minutes, 48 seconds Radiation Exposure Index (if provided by the fluoroscopic device): 46.2 mGy Number of Acquired Spot Images: 0 COMPARISON:  02/29/2020 FINDINGS: A right chest tube is in place. Gastric pull-through noted with mild irregularity of the lumen along the gastroesophageal staple line for example on series 19. While a free leak is not identified, there is a small outpouching along the anastomotic staple line for example on series 28 posteriorly. This is also shown on series 37. The stomach is nearly entirely in the chest, with the duodenal bulb just below the diaphragm. Contrast slowly extends into the duodenal bulb and descending duodenum. IMPRESSION: 1. Along the anastomotic site there is a small contained collection of contrast which could be from an ulceration or potentially a small contained perforation. However, I do not observe a frank leak of contrast or free spread of contrast from this region. Electronically Signed   By: Van Clines M.D.   On: 03/06/2020 11:10    Labs:  CBC: Recent Labs    03/06/20 0451 03/07/20 0106 03/08/20 0015 03/09/20 0027  WBC 28.6* 22.1* 12.6* 8.9  HGB 10.1* 9.6* 9.5* 9.4*  HCT 30.3* 29.7* 29.0* 29.6*  PLT 258 346 355 379    COAGS: Recent Labs    09/18/19 0808 02/22/20 0830 03/08/20 0015  INR 1.2 1.0 1.1  APTT  --  30  --     BMP: Recent Labs    12/27/19 0810 12/27/19 0810 01/03/20 0813 01/03/20 0813 01/11/20 9509 01/11/20 0814 01/26/20 1426 02/22/20 0830 03/06/20 0451 03/07/20 0106 03/08/20 0015 03/09/20 0027  NA 136   < > 140   < > 139   < > 140   < > 130* 135 136 136  K 4.0   < > 3.9   < > 4.2   < > 4.2   < > 4.2 4.2 3.7 4.0  CL 107   < > 106   < > 105   < > 106   < > 97* 100 99 99  CO2 23   < > 27   < > 29   < > 29   < > 23 26 28 27   GLUCOSE 117*   < > 127*   < > 105*   < > 99   < > 168* 138* 109* 119*  BUN 14   < >  12   < > 11   < > 10   < > 16 19 18 14   CALCIUM 9.4   < > 9.4   < > 9.0   < > 8.8*   < > 7.6* 8.1* 7.8* 7.8*  CREATININE 0.66   < > 0.63   < > 0.68   < > 0.71   < > 0.78 0.83 0.72 0.77  GFRNONAA >60   < > >60   < > >60   < > >60   < > >60 >60 >60 >60  GFRAA >60  --  >60  --  >60  --  >60  --   --   --   --   --    < > = values in this interval not displayed.    LIVER FUNCTION TESTS: Recent Labs    01/03/20 0813 01/11/20 0814 01/26/20 1426 02/22/20 0830  BILITOT 0.2* <0.2* 0.3 0.5  AST 29 37 29 29  ALT 34 49* 21 19  ALKPHOS 64 71 96 73  PROT 6.1* 6.4* 6.3* 6.8  ALBUMIN 3.1* 3.1* 3.1* 3.7    Assessment and Plan:   Left posterior empyema; s/p left posterior chest tube placement 03/08/20: 990 ml output documented in  Epic for chest tube #2 placed by IR. No air leaks, tube functioning as expected. Further plans per TCTS.  Please call IR with any questions.   Electronically Signed: Soyla Dryer, AGACNP-BC [phone] 03/09/2020, 4:23 PM   I spent a total of 15 Minutes at the the patient's bedside AND on the patient's  hospital floor or unit, greater than 50% of which was counseling/coordinating care for left chest tube care

## 2020-03-09 NOTE — Progress Notes (Signed)
This note also relates to the following rows which could not be included: BP - Cannot attach notes to unvalidated device data Pulse Rate - Cannot attach notes to unvalidated device data ECG Heart Rate - Cannot attach notes to unvalidated device data

## 2020-03-09 NOTE — Progress Notes (Addendum)
[address]       [place], [address]             [phone]      14 Days Post-Op Procedure(s) (LRB): XI ROBOTIC ASSISTED IVOR LEWIS ESOPHAGECTOMY (N/A) LAPARSCOPIC  JEJUNOSTOMY TUBE PLACEMENT (N/A) ESOPHAGOGASTRODUODENOSCOPY (EGD) (N/A) INTERCOSTAL NERVE BLOCK (Right) Subjective:  Awake and alert. Says he is breathing much better today after IR drainage of left pleural effusion.  T-spike to 102 yesterday afternoon.   Objective: Vital signs in last 24 hours: Temp:  [98 F (36.7 C)-102.2 F (39 C)] 98 F (36.7 C) (11/04 0746) Pulse Rate:  [72-96] 72 (11/04 0746) Cardiac Rhythm: Normal sinus rhythm (11/04 0701) Resp:  [13-19] 17 (11/04 0746) BP: (97-119)/(53-79) 97/76 (11/04 0336) SpO2:  [91 %-94 %] 92 % (11/04 0746) Weight:  [82.6 kg] 82.6 kg (11/04 0336)  Intake/Output from previous day: 11/03 0701 - 11/04 0700 In: -  Out: 2265 [Urine:1200; Drains:25; Chest Tube:1040] Intake/Output this shift: No intake/output data recorded.  General appearance: alert, cooperative and and no distress Heart: RRR Lungs:  Breath sounds full and clear.  Two left pleural pigtail catheters are secure, posterior tube placed by IR drained over 1 L since insertion.  Abdomen: soft, non tender except near the J-tube exit site which is inflammed and tender.  Extremities: no edema Wound: incis healing well  Lab Results: Recent Labs    03/08/20 0015 03/09/20 0027  WBC 12.6* 8.9  HGB 9.5* 9.4*  HCT 29.0* 29.6*  PLT 355 379   BMET:  Recent Labs    03/08/20 0015 03/09/20 0027  NA 136 136  K 3.7 4.0  CL 99 99  CO2 28 27  GLUCOSE 109* 119*  BUN 18 14  CREATININE 0.72 0.77  CALCIUM 7.8* 7.8*    PT/INR:  Recent Labs    03/08/20 0015  LABPROT 14.0  INR 1.1   ABG    Component Value Date/Time   PHART 7.454 (H) 02/25/2020 0500   HCO3 26.5 02/25/2020 0500   TCO2 27 02/24/2020 1944   ACIDBASEDEF 1.0 02/24/2020 1547   O2SAT 93.7 02/25/2020 0500   CBG  (last 3)  Recent Labs    03/08/20 1954 03/08/20 2351 03/09/20 0357  GLUCAP 101* 119* 130*    Meds Scheduled Meds: . enoxaparin (LOVENOX) injection  40 mg Subcutaneous Q24H  . feeding supplement (OSMOLITE 1.5 CAL)  1,520 mL Per Tube Q24H  . feeding supplement (PROSource TF)  45 mL Per Tube Daily  . free water  100 mL Per Tube Q4H  . Gerhardt's butt cream   Topical Daily  . insulin aspart  0-24 Units Subcutaneous Q4H  . pantoprazole (PROTONIX) IV  40 mg Intravenous Q12H  . silver sulfADIAZINE   Topical BID  . sorbitol  30 mL Per Tube Once   Continuous Infusions: . piperacillin-tazobactam (ZOSYN)  IV 3.375 g (03/09/20 0615)  . vancomycin 1,750 mg (03/09/20 0319)   PRN Meds:.acetaminophen (TYLENOL) oral liquid 160 mg/5 mL, guaiFENesin-dextromethorphan, levalbuterol, morphine injection, ondansetron (ZOFRAN) IV  Xrays CT CHEST W CONTRAST  Result Date: 03/07/2020 CLINICAL DATA:  Shortness of breath and hypoxia. Bilateral chest tubes. EXAM: CT CHEST WITH CONTRAST TECHNIQUE: Multidetector CT imaging of the chest was performed during intravenous contrast administration. CONTRAST:  9mL OMNIPAQUE IOHEXOL 300 MG/ML  SOLN COMPARISON:  Radiography same day. FINDINGS: Cardiovascular: Heart size is normal.  No pericardial fluid. Mediastinum/Nodes: Previous esophagectomy and pull-through surgery. Lungs/Pleura: On the right, there is a  small amount of pleural fluid layering dependently and there is mild-to-moderate dependent pulmonary atelectasis. Right chest tube is in place. No pleural air. Chest tube is in communication with the residual pleural density. On the left, there is large pleural fluid collection posteriorly which appears loculated with air bubbles and air-fluid level consistent with empyema. The adjacent dependent left lung shows atelectasis. The left-sided chest tube is not visibly in communication with the loculated collection. No free pleural air. Upper Abdomen: Hypoperfusion of the  ventral corner of the spleen suggesting splenic infarct. Multiple renal cysts as seen previously. Musculoskeletal: Ordinary thoracic degenerative changes. IMPRESSION: 1. Previous esophagectomy and pull-through surgery. 2. Large loculated pleural fluid collection posteriorly on the left with air bubbles and air-fluid level consistent with empyema. The left-sided chest tube is not visibly in communication with the loculated collection. 3. Small amount of pleural fluid on the right with dependent pulmonary atelectasis. 4. 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 Electronically Signed   By: Nelson Chimes M.D.   On: 03/07/2020 16:26   DG CHEST PORT 1 VIEW  Result Date: 03/07/2020 CLINICAL DATA:  Shortness of breath.  Hypoxia. EXAM: PORTABLE CHEST 1 VIEW COMPARISON:  05/2019, 03/05/2020. FINDINGS: Right chest tube in stable position. No pneumothorax. Persistent large left pleural effusion. Underlying atelectasis/infiltrate cannot be excluded. Right base pleural-parenchymal thickening/postsurgical change again noted. Mild right upper lung infiltrate noted on today's exam. Small right pleural effusion again noted. Heart size stable. IMPRESSION: 1. Right chest tube in stable position. 2. No pneumothorax. Persistent large left pleural effusion. Underlying atelectasis/infiltrate cannot be excluded. No interim change in appearance. 3. Mild right upper lung infiltrate noted on today's exam. Right base pleural-parenchymal thickening/postsurgical change again noted. Small right pleural effusion again noted. Electronically Signed   By: Marcello Moores  Register   On: 03/07/2020 08:49   CT IMAGE GUIDED DRAINAGE BY PERCUTANEOUS CATHETER  Result Date: 03/08/2020 INDICATION: 64 year old male with large left hydropneumothorax status post esophagectomy and gastric pull-through procedure. He presents for CT-guided drain placement. EXAM: CT-guided chest tube MEDICATIONS: The patient is currently admitted  to the hospital and receiving intravenous antibiotics. The antibiotics were administered within an appropriate time frame prior to the initiation of the procedure. ANESTHESIA/SEDATION: Fentanyl 50 mcg IV; Versed 1 mg IV Moderate Sedation Time:  16 minutes The patient was continuously monitored during the procedure by the interventional radiology nurse under my direct supervision. COMPLICATIONS: None immediate. PROCEDURE: Informed written consent was obtained from the patient after a thorough discussion of the procedural risks, benefits and alternatives. All questions were addressed. Maximal Sterile Barrier Technique was utilized including caps, mask, sterile gowns, sterile gloves, sterile drape, hand hygiene and skin antiseptic. A timeout was performed prior to the initiation of the procedure. A planning axial CT scan was performed. The large hydropneumothorax was identified in the left hemithorax. A suitable skin entry site was selected and marked. The overlying skin was sterilely prepped and draped in the standard fashion using chlorhexidine skin prep. Local anesthesia was attained by infiltration with 1% lidocaine. A small dermatotomy was made. Under intermittent CT guidance, an 18 gauge trocar needle was advanced into the collection. A 0.035 wire was then advanced into the pleural space. The needle was removed. The skin tract was dilated to 12 [place]. A Cook 12 [place] all-purpose drainage catheter was advanced over the wire and formed in the hydropneumothorax. The catheter was connected to a pleura vac and wall suction. Follow-up CT imaging demonstrates a well-positioned percutaneous chest  tube and decreasing hydropneumothorax. The catheter was secured to the skin with 0 Prolene suture. Sterile bandages were applied. IMPRESSION: Successful placement of a left-sided 32 French percutaneous thoracostomy tube. Electronically Signed   By: Jacqulynn Cadet M.D.   On: 03/08/2020 16:27   Results for orders placed or  performed during the hospital encounter of 02/24/20  Respiratory Panel by PCR     Status: None   Collection Time: 03/05/20 10:11 AM   Specimen: Nasopharyngeal Swab; Respiratory  Result Value Ref Range Status   Adenovirus NOT DETECTED NOT DETECTED Final   Coronavirus 229E NOT DETECTED NOT DETECTED Final    Comment: (NOTE) The Coronavirus on the Respiratory Panel, DOES NOT test for the novel  Coronavirus (2019 nCoV)    Coronavirus HKU1 NOT DETECTED NOT DETECTED Final   Coronavirus NL63 NOT DETECTED NOT DETECTED Final   Coronavirus OC43 NOT DETECTED NOT DETECTED Final   Metapneumovirus NOT DETECTED NOT DETECTED Final   Rhinovirus / Enterovirus NOT DETECTED NOT DETECTED Final   Influenza A NOT DETECTED NOT DETECTED Final   Influenza B NOT DETECTED NOT DETECTED Final   Parainfluenza Virus 1 NOT DETECTED NOT DETECTED Final   Parainfluenza Virus 2 NOT DETECTED NOT DETECTED Final   Parainfluenza Virus 3 NOT DETECTED NOT DETECTED Final   Parainfluenza Virus 4 NOT DETECTED NOT DETECTED Final   Respiratory Syncytial Virus NOT DETECTED NOT DETECTED Final   Bordetella pertussis NOT DETECTED NOT DETECTED Final   Chlamydophila pneumoniae NOT DETECTED NOT DETECTED Final   Mycoplasma pneumoniae NOT DETECTED NOT DETECTED Final    Comment: Performed at [facility], [address] [place], [place] [address]  Gram stain     Status: None   Collection Time: 03/05/20  2:20 PM   Specimen: PATH Cytology Pleural fluid  Result Value Ref Range Status   Specimen Description PLEURAL FLUID  Final   Special Requests LEFT CHEST  Final   Gram Stain   Final    WBC PRESENT, PREDOMINANTLY PMN GRAM POSITIVE COCCI IN PAIRS CYTOSPIN SMEAR Performed at [facility], [place] [address] [place], [place] [address]    Report Status 03/05/2020 FINAL  Final  Culture, body fluid-bottle     Status: None (Preliminary result)   Collection Time: 03/05/20  2:20 PM   Specimen: Pleura  Result Value Ref Range Status    Specimen Description PLEURAL FLUID  Final   Special Requests LEFT CHEST  Final   Gram Stain   Final    GRAM NEGATIVE RODS IN BOTH AEROBIC AND ANAEROBIC BOTTLES CRITICAL RESULT CALLED TO, READ BACK BY AND VERIFIED WITH: RN M.SNIDER AT 9417 ON [id number] BY T.SAAD    Culture   Final    NO GROWTH 4 DAYS Performed at [facility], [place] [address] [place], [place] [address]    Report Status PENDING  Incomplete  Urine culture     Status: None   Collection Time: 03/05/20  3:48 PM   Specimen: Urine, Clean Catch  Result Value Ref Range Status   Specimen Description URINE, CLEAN CATCH  Final   Special Requests Normal  Final   Culture   Final    NO GROWTH Performed at [facility], [place] [address] [place], [place] [address]    Report Status 03/06/2020 FINAL  Final  Culture, blood (Routine X 2) w Reflex to ID Panel     Status: None (Preliminary result)   Collection Time: 03/05/20  4:32 PM  Specimen: BLOOD  Result Value Ref Range Status   Specimen Description BLOOD BLOOD RIGHT HAND  Final   Special Requests   Final    BOTTLES DRAWN AEROBIC ONLY Blood Culture adequate volume   Culture   Final    NO GROWTH 4 DAYS Performed at [facility], [address] [place], [place] [address]    Report Status PENDING  Incomplete  Culture, blood (Routine X 2) w Reflex to ID Panel     Status: None (Preliminary result)   Collection Time: 03/05/20  4:32 PM   Specimen: BLOOD  Result Value Ref Range Status   Specimen Description BLOOD BLOOD LEFT HAND  Final   Special Requests   Final    BOTTLES DRAWN AEROBIC ONLY Blood Culture results may not be optimal due to an inadequate volume of blood received in culture bottles   Culture   Final    NO GROWTH 4 DAYS Performed at [facility], [place] [address], [place], [place] [address]    Report Status PENDING  Incomplete   Assessment/Plan: S/P Procedure(s) (LRB): XI ROBOTIC ASSISTED IVOR LEWIS ESOPHAGECTOMY (N/A) LAPARSCOPIC  JEJUNOSTOMY  TUBE PLACEMENT (N/A) ESOPHAGOGASTRODUODENOSCOPY (EGD) (N/A) INTERCOSTAL NERVE BLOCK (Right)     -POD-14 Ivor-Lewis esophagectomy and 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 Esophagram early post-op and repeat study 03/06/20 show no anatsamotic leak and free flow of contrast through the pylorus. Continuing nutritional support with nocturnal J-tube feedings. Resume clear liquids.  -Fever / leukocytosis- respiratory viral panel negative.  WBC trending down. On day 5 Vanc / Zosyn.  No clear source except for some cellulitis at the J-tube exit site. Blood Cx negative at 2 days. Continue empiric treatment and wound care.   -Left pleural effusion- POD 4 thoracentesis by IR yielding 1L "hazy amber" fluid. Gram stain on pleural fluid showed Gm+ cocci in pairs, but Cx shows no growth at 2 days. The post thoracentesis CXR showed persistent  Left pleural fluid / volume loss so a left pleural pigtail catheter was placed at the bedside on 03/06/20 and has drained 317ml since insertion. CT chest on 11/2 demonstrated a large posterior left pleural effusion and showed the pigtail catheter. IR placed an image-guided catheter into the left posterior effusion yesterday and has drained over 1L.   -DVT PPX- cont enoxaparin daily.   -Hyperglycemia- mild, related to TF. No H/O DM. Still requires occasional insulin coverage so will continue the SSI.    [name] PA-C Pager [phone] 03/09/2020  Low grade fevers WBC trending down Will get CT guided drain today for posterior effusion  [name]  Agree with above. 1 L drained with placement of new chest tube. Respiratory status improved Leukocytosis improved as well. Will start on clears today We will consider removing the right-sided Blake drain.  Yeriel Mineo [name]

## 2020-03-09 NOTE — Progress Notes (Signed)
Occupational Therapy Treatment Patient Details Name: Brett Dawson MRN: [id number] DOB: [birth year] Today's Date: 03/09/2020    History of present illness 64 yo male with Esophageal cancer. s/p 02/24/20 robotic assisted Ivor-Lewis esophagectomy with jejunostomy tube placement. Pt with increasing shortness of breath and fever with chest tubes placed x 2 (11/1 and 11/2)   OT comments  Pt requiring encouragement to participate in OOB activity. Educated on benefits on ongoing mobility. Pt requiring min assist with second person for safety and lines.Instructed in pacing and pursed lip breathing with exertion. He demonstrates increased weakness from admission. Updated d/c disposition to reflect. Pt with concerns about not being able to manage his feeding tube once he returns home, may need [facility] nursing as well.  Follow Up Recommendations  Home health OT;Supervision/Assistance - 24 hour    Equipment Recommendations  3 in 1 bedside commode    Recommendations for Other Services      Precautions / Restrictions Precautions Precautions: Fall;ICD/Pacemaker Precaution Comments: chest tubes x 2, watch 02       Mobility Bed Mobility Overal bed mobility: Needs Assistance Bed Mobility: Supine to Sit     Supine to sit: +2 for physical assistance;Min assist     General bed mobility comments: assist to pivot hips and raise trunk to sit at EOB  Transfers Overall transfer level: Needs assistance Equipment used: Rolling walker (2 wheeled) Transfers: Sit to/from Stand Sit to Stand: Min guard;+2 safety/equipment         General transfer comment: increased time and effort, pulled up on RW    Balance Overall balance assessment: Needs assistance   Sitting balance-Leahy Scale: Good     Standing balance support: Bilateral upper extremity supported Standing balance-Leahy Scale: Poor                             ADL either performed or assessed with clinical judgement   ADL Overall  ADL's : Needs assistance/impaired                 Upper Body Dressing : Minimal assistance;Sitting Upper Body Dressing Details (indicate cue type and reason): front opening gown                 Functional mobility during ADLs: Minimal assistance;+2 for safety/equipment General ADL Comments: pt requiring encouragement for OOB mobility and to remain up in chair for lunch at end of session     Vision       Perception     Praxis      Cognition Arousal/Alertness: Awake/alert Behavior During Therapy: [facility] for tasks assessed/performed Overall Cognitive Status: Within Functional Limits for tasks assessed                                          Exercises     Shoulder Instructions       General Comments pt on 6L 02 for ambulation with Sp02 of 93%    Pertinent Vitals/ Pain       Pain Assessment: Faces Faces Pain Scale: Hurts little more Pain Location: chest tube sites Pain Descriptors / Indicators: Discomfort Pain Intervention(s): Monitored during session;Repositioned  Home Living  Prior Functioning/Environment              Frequency  Min 2X/week        Progress Toward Goals  OT Goals(current goals can now be found in the care plan section)  Progress towards OT goals: Not progressing toward goals - comment (pt now with chest tubes x 2)  Acute Rehab OT Goals Patient Stated Goal: get back to work OT Goal Formulation: With patient Time For Goal Achievement: 03/15/20 Potential to Achieve Goals: Good  Plan Discharge plan remains appropriate    Co-evaluation    PT/OT/SLP Co-Evaluation/Treatment: Yes Reason for Co-Treatment: For patient/therapist safety   OT goals addressed during session: ADL's and self-care;Proper use of Adaptive equipment and DME      AM-PAC OT "6 Clicks" Daily Activity     Outcome Measure   Help from another person eating meals?: None Help from  another person taking care of personal grooming?: A Little Help from another person toileting, which includes using toliet, bedpan, or urinal?: A Little Help from another person bathing (including washing, rinsing, drying)?: A Little Help from another person to put on and taking off regular upper body clothing?: A Little Help from another person to put on and taking off regular lower body clothing?: A Little 6 Click Score: 19    End of Session Equipment Utilized During Treatment: Gait belt;Rolling walker;Oxygen (5L at rest, 6L on tank)  OT Visit Diagnosis: Muscle weakness (generalized) (M62.81)   Activity Tolerance Patient tolerated treatment well   Patient Left in chair;with call bell/phone within reach   Nurse Communication Mobility status        Time: 6440-3474 OT Time Calculation (min): 28 min  Charges: OT General Charges $OT Visit: 1 Visit OT Treatments $Therapeutic Activity: 8-22 mins  Nestor Lewandowsky, OTR/L Acute Rehabilitation Services Pager: [phone] Office: [phone] Malka So 03/09/2020, 1:02 PM

## 2020-03-09 NOTE — Plan of Care (Signed)

## 2020-03-10 ENCOUNTER — Inpatient Hospital Stay (HOSPITAL_COMMUNITY): Payer: 59

## 2020-03-10 LAB — CBC
HCT: 29.6 % — ABNORMAL LOW (ref 39.0–52.0)
Hemoglobin: 9.3 g/dL — ABNORMAL LOW (ref 13.0–17.0)
MCH: 29.1 pg (ref 26.0–34.0)
MCHC: 31.4 g/dL (ref 30.0–36.0)
MCV: 92.5 fL (ref 80.0–100.0)
Platelets: 347 10*3/uL (ref 150–400)
RBC: 3.2 MIL/uL — ABNORMAL LOW (ref 4.22–5.81)
RDW: 18.5 % — ABNORMAL HIGH (ref 11.5–15.5)
WBC: 7.2 10*3/uL (ref 4.0–10.5)
nRBC: 0 % (ref 0.0–0.2)

## 2020-03-10 LAB — GLUCOSE, CAPILLARY
Glucose-Capillary: 113 mg/dL — ABNORMAL HIGH (ref 70–99)
Glucose-Capillary: 113 mg/dL — ABNORMAL HIGH (ref 70–99)
Glucose-Capillary: 128 mg/dL — ABNORMAL HIGH (ref 70–99)
Glucose-Capillary: 130 mg/dL — ABNORMAL HIGH (ref 70–99)
Glucose-Capillary: 139 mg/dL — ABNORMAL HIGH (ref 70–99)
Glucose-Capillary: 141 mg/dL — ABNORMAL HIGH (ref 70–99)

## 2020-03-10 LAB — CULTURE, BLOOD (ROUTINE X 2)
Culture: NO GROWTH
Culture: NO GROWTH
Special Requests: ADEQUATE

## 2020-03-10 LAB — BASIC METABOLIC PANEL
Anion gap: 9 (ref 5–15)
BUN: 11 mg/dL (ref 8–23)
CO2: 27 mmol/L (ref 22–32)
Calcium: 7.4 mg/dL — ABNORMAL LOW (ref 8.9–10.3)
Chloride: 98 mmol/L (ref 98–111)
Creatinine, Ser: 0.72 mg/dL (ref 0.61–1.24)
GFR, Estimated: >60 mL/min (ref >60)
Glucose, Bld: 127 mg/dL — ABNORMAL HIGH (ref 70–99)
Potassium: 3.6 mmol/L (ref 3.5–5.1)
Sodium: 134 mmol/L — ABNORMAL LOW (ref 135–145)

## 2020-03-10 LAB — MRSA PCR SCREENING: MRSA by PCR: NEGATIVE

## 2020-03-10 LAB — VANCOMYCIN, TROUGH: Vancomycin Tr: 11 ug/mL — ABNORMAL LOW (ref 15–20)

## 2020-03-10 MED ORDER — VANCOMYCIN HCL 1250 MG/250ML IV SOLN
1250.0000 mg | Freq: Three times a day (TID) | INTRAVENOUS | Status: DC
  Administered 2020-03-10 – 2020-03-12 (×6): 1250 mg via INTRAVENOUS
  Filled 2020-03-10 (×6): qty 250

## 2020-03-10 NOTE — Progress Notes (Signed)
Pharmacy Antibiotic Note  Brett Dawson is a 64 y.o. male admitted on 02/24/2020 s/p esophagectomy with fevers and concern for PNA. Pt started on vancomycin and Zosyn.   Cr remains stable, vancomycin trough 11 mcg/ml (subtherapeutic).   Plan: Adjust vancomycin to 1250mg  IV q8h Continue Zosyn 3.375g IV EI q8h Follow Cr Repeat VT at [facility]   Height: 5\' 11"  (180.3 cm) Weight: 94.7 kg (208 lb 12.4 oz) IBW/kg (Calculated) : 75.3  Temp (24hrs), Avg:99.4 F (37.4 C), Min:98.5 F (36.9 C), Max:101.3 F (38.5 C)  Recent Labs  Lab 03/06/20 0451 03/07/20 0106 03/08/20 0015 03/09/20 0027 03/10/20 0044 03/10/20 1355  WBC 28.6* 22.1* 12.6* 8.9 7.2  --   CREATININE 0.78 0.83 0.72 0.77 0.72  --   VANCOTROUGH  --   --   --   --   --  11*    Estimated Creatinine Clearance: 111.1 mL/min (by C-G formula based on SCr of 0.72 mg/dL).    No Known Allergies  Antimicrobials this admission: Cefoxitin periop 10/22 Cefazolin 10/30 Vanc 10/31> Zosyn 10/31>   Microbiology results: 10/31 Pleural fluid: pending 10/31 UCx: negative 10/31 BCx: NGTD  Arrie Senate, PharmD, BCPS Clinical Pharmacist [phone] Please check AMION for all [place] numbers 03/10/2020

## 2020-03-10 NOTE — Progress Notes (Addendum)
[address]       [place] [address]             [phone]      15 Days Post-Op Procedure(s) (LRB): XI ROBOTIC ASSISTED IVOR LEWIS ESOPHAGECTOMY (N/A) LAPARSCOPIC  JEJUNOSTOMY TUBE PLACEMENT (N/A) ESOPHAGOGASTRODUODENOSCOPY (EGD) (N/A) INTERCOSTAL NERVE BLOCK (Right) Subjective:  Awake and alert. Says he feels about the same. Tolerating clear liquids without nausea. T-max 101.3 at 3am.   Worked with PT and OT yesterday. Says activity most limited by back pain. He is having less pain at the J-tube exit site.  Objective: Vital signs in last 24 hours: Temp:  [98.5 F (36.9 C)-101.3 F (38.5 C)] 98.5 F (36.9 C) (11/05 0758) Pulse Rate:  [79-95] 81 (11/05 0758) Cardiac Rhythm: Normal sinus rhythm (11/05 0745) Resp:  [17-23] 18 (11/05 0758) BP: (100-127)/(58-67) 100/63 (11/05 0758) SpO2:  [90 %-95 %] 90 % (11/05 0758) Weight:  [94.7 kg] 94.7 kg (11/05 0438)  Intake/Output from previous day: 11/04 0701 - 11/05 0700 In: 11831.6 [P.O.:120; NG/GT:11711.6] Out: 1695 [Urine:925; Chest Tube:770] Intake/Output this shift: Total I/O In: -  Out: 100 [Chest Tube:100]  General appearance: alert, cooperative and and no distress Heart: RRR Lungs:  Breath sounds full and clear.  Two left pleural pigtail catheters are secure, posterior tube placed by IR drained 715ml past 24 hours. Minimal drainage from left anterior pleural tube and right pleural tube.  Abdomen: soft, non tender  Extremities: no edema Wound: incis healing well  Lab Results: Recent Labs    03/09/20 0027 03/10/20 0044  WBC 8.9 7.2  HGB 9.4* 9.3*  HCT 29.6* 29.6*  PLT 379 347   BMET:  Recent Labs    03/09/20 0027 03/10/20 0044  NA 136 134*  K 4.0 3.6  CL 99 98  CO2 27 27  GLUCOSE 119* 127*  BUN 14 11  CREATININE 0.77 0.72  CALCIUM 7.8* 7.4*    PT/INR:  Recent Labs    03/08/20 0015  LABPROT 14.0  INR 1.1   ABG    Component Value Date/Time   PHART 7.454 (H) 02/25/2020  0500   HCO3 26.5 02/25/2020 0500   TCO2 27 02/24/2020 1944   ACIDBASEDEF 1.0 02/24/2020 1547   O2SAT 93.7 02/25/2020 0500   CBG (last 3)  Recent Labs    03/09/20 1958 03/09/20 2341 03/10/20 0420  GLUCAP 140* 115* 128*    Meds Scheduled Meds: . enoxaparin (LOVENOX) injection  40 mg Subcutaneous Q24H  . feeding supplement  1 Container Oral TID BM  . feeding supplement (OSMOLITE 1.5 CAL)  1,520 mL Per Tube Q24H  . feeding supplement (PROSource TF)  45 mL Per Tube Daily  . free water  100 mL Per Tube Q4H  . Gerhardt's butt cream   Topical Daily  . insulin aspart  0-24 Units Subcutaneous Q4H  . pantoprazole (PROTONIX) IV  40 mg Intravenous Q12H  . silver sulfADIAZINE   Topical BID  . sorbitol  30 mL Per Tube Once   Continuous Infusions: . piperacillin-tazobactam (ZOSYN)  IV 3.375 g (03/10/20 0646)  . vancomycin 1,750 mg (03/10/20 0345)   PRN Meds:.acetaminophen (TYLENOL) oral liquid 160 mg/5 mL, guaiFENesin-dextromethorphan, levalbuterol, morphine injection, ondansetron (ZOFRAN) IV  Xrays DG CHEST PORT 1 VIEW  Result Date: 03/10/2020 CLINICAL DATA:  Pleural effusion. EXAM: PORTABLE CHEST 1 VIEW COMPARISON:  CT 03/07/2020.  Chest x-ray 03/07/2020. FINDINGS: Right chest tube in stable position. Left lower chest tube in stable position.  Interim placement of a second left chest tube just above the left lower chest tube. Interim improvement of left-sided pleural effusion with residual pleural effusion present. No pneumothorax. Persistent bibasilar atelectasis/infiltrates. Small right pleural effusion. Heart size stable. IMPRESSION: Right chest tube in stable position. Left lower chest tube in stable position. Interim placement of a second left chest tube just above the left lower chest tube. Interim improvement of left-sided pleural effusion with residual moderate pleural effusion present. Persistent bibasilar atelectasis/infiltrates. Electronically Signed   By: Marcello Moores  Register   On:  03/10/2020 07:15   CT IMAGE GUIDED DRAINAGE BY PERCUTANEOUS CATHETER  Result Date: 03/08/2020 INDICATION: 64 year old male with large left hydropneumothorax status post esophagectomy and gastric pull-through procedure. He presents for CT-guided drain placement. EXAM: CT-guided chest tube MEDICATIONS: The patient is currently admitted to the hospital and receiving intravenous antibiotics. The antibiotics were administered within an appropriate time frame prior to the initiation of the procedure. ANESTHESIA/SEDATION: Fentanyl 50 mcg IV; Versed 1 mg IV Moderate Sedation Time:  16 minutes The patient was continuously monitored during the procedure by the interventional radiology nurse under my direct supervision. COMPLICATIONS: None immediate. PROCEDURE: Informed written consent was obtained from the patient after a thorough discussion of the procedural risks, benefits and alternatives. All questions were addressed. Maximal Sterile Barrier Technique was utilized including caps, mask, sterile gowns, sterile gloves, sterile drape, hand hygiene and skin antiseptic. A timeout was performed prior to the initiation of the procedure. A planning axial CT scan was performed. The large hydropneumothorax was identified in the left hemithorax. A suitable skin entry site was selected and marked. The overlying skin was sterilely prepped and draped in the standard fashion using chlorhexidine skin prep. Local anesthesia was attained by infiltration with 1% lidocaine. A small dermatotomy was made. Under intermittent CT guidance, an 18 gauge trocar needle was advanced into the collection. A 0.035 wire was then advanced into the pleural space. The needle was removed. The skin tract was dilated to 12 [place]. A Cook 12 [place] all-purpose drainage catheter was advanced over the wire and formed in the hydropneumothorax. The catheter was connected to a pleura vac and wall suction. Follow-up CT imaging demonstrates a well-positioned  percutaneous chest tube and decreasing hydropneumothorax. The catheter was secured to the skin with 0 Prolene suture. Sterile bandages were applied. IMPRESSION: Successful placement of a left-sided 85 French percutaneous thoracostomy tube. Electronically Signed   By: Jacqulynn Cadet M.D.   On: 03/08/2020 16:27   Results for orders placed or performed during the hospital encounter of 02/24/20  Respiratory Panel by PCR     Status: None   Collection Time: 03/05/20 10:11 AM   Specimen: Nasopharyngeal Swab; Respiratory  Result Value Ref Range Status   Adenovirus NOT DETECTED NOT DETECTED Final   Coronavirus 229E NOT DETECTED NOT DETECTED Final    Comment: (NOTE) The Coronavirus on the Respiratory Panel, DOES NOT test for the novel  Coronavirus (2019 nCoV)    Coronavirus HKU1 NOT DETECTED NOT DETECTED Final   Coronavirus NL63 NOT DETECTED NOT DETECTED Final   Coronavirus OC43 NOT DETECTED NOT DETECTED Final   Metapneumovirus NOT DETECTED NOT DETECTED Final   Rhinovirus / Enterovirus NOT DETECTED NOT DETECTED Final   Influenza A NOT DETECTED NOT DETECTED Final   Influenza B NOT DETECTED NOT DETECTED Final   Parainfluenza Virus 1 NOT DETECTED NOT DETECTED Final   Parainfluenza Virus 2 NOT DETECTED NOT DETECTED Final   Parainfluenza Virus 3 NOT DETECTED NOT DETECTED Final  Parainfluenza Virus 4 NOT DETECTED NOT DETECTED Final   Respiratory Syncytial Virus NOT DETECTED NOT DETECTED Final   Bordetella pertussis NOT DETECTED NOT DETECTED Final   Chlamydophila pneumoniae NOT DETECTED NOT DETECTED Final   Mycoplasma pneumoniae NOT DETECTED NOT DETECTED Final    Comment: Performed at [facility], [place] [address], [place],  [place] [address]  Gram stain     Status: None   Collection Time: 03/05/20  2:20 PM   Specimen: PATH Cytology Pleural fluid  Result Value Ref Range Status   Specimen Description PLEURAL FLUID  Final   Special Requests LEFT CHEST  Final   Gram Stain   Final    WBC  PRESENT, PREDOMINANTLY PMN GRAM POSITIVE COCCI IN PAIRS CYTOSPIN SMEAR Performed at [facility], [place] [address], [place], [place] [address]    Report Status 03/05/2020 FINAL  Final  Culture, body fluid-bottle     Status: None (Preliminary result)   Collection Time: 03/05/20  2:20 PM   Specimen: Pleura  Result Value Ref Range Status   Specimen Description PLEURAL FLUID  Final   Special Requests LEFT CHEST  Final   Gram Stain   Final    GRAM NEGATIVE RODS IN BOTH AEROBIC AND ANAEROBIC BOTTLES CRITICAL RESULT CALLED TO, READ BACK BY AND VERIFIED WITH: RN M.SNIDER AT 1779 ON [id number] BY T.SAAD    Culture   Final    CULTURE REINCUBATED FOR BETTER GROWTH Performed at [facility], [place] [address] [place], [place] [address]    Report Status PENDING  Incomplete  Urine culture     Status: None   Collection Time: 03/05/20  3:48 PM   Specimen: Urine, Clean Catch  Result Value Ref Range Status   Specimen Description URINE, CLEAN CATCH  Final   Special Requests Normal  Final   Culture   Final    NO GROWTH Performed at [facility], [place] [address] [place], [place] [address]    Report Status 03/06/2020 FINAL  Final  Culture, blood (Routine X 2) w Reflex to ID Panel     Status: None   Collection Time: 03/05/20  4:32 PM   Specimen: BLOOD  Result Value Ref Range Status   Specimen Description BLOOD BLOOD RIGHT HAND  Final   Special Requests   Final    BOTTLES DRAWN AEROBIC ONLY Blood Culture adequate volume   Culture   Final    NO GROWTH 5 DAYS Performed at [facility], [place] [address], [place], [place] [address]    Report Status 03/10/2020 FINAL  Final  Culture, blood (Routine X 2) w Reflex to ID Panel     Status: None   Collection Time: 03/05/20  4:32 PM   Specimen: BLOOD  Result Value Ref Range Status   Specimen Description BLOOD BLOOD LEFT HAND  Final   Special Requests   Final    BOTTLES DRAWN AEROBIC ONLY Blood Culture results may not be optimal due to an  inadequate volume of blood received in culture bottles   Culture   Final    NO GROWTH 5 DAYS Performed at [facility], [place] [address], [place], [place] [address]    Report Status 03/10/2020 FINAL  Final   Assessment/Plan: S/P Procedure(s) (LRB): XI ROBOTIC ASSISTED IVOR LEWIS ESOPHAGECTOMY (N/A) LAPARSCOPIC  JEJUNOSTOMY TUBE PLACEMENT (N/A) ESOPHAGOGASTRODUODENOSCOPY (EGD) (N/A) INTERCOSTAL NERVE BLOCK (Right)     -POD-15 Ivor-Lewis esophagectomy and 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 Esophagram early post-op and repeat study 03/06/20 show no anatsamotic leak and free flow of contrast through the pylorus. Continuing nutritional support with nocturnal J-tube feedings. Tolerating clear liquids. Do not plan to advance diet beyond clear liquids for now. Likely remove the right pleural drain today if Dr. Kipp Brood agrees.   -Fever / leukocytosis- respiratory viral panel negative.  WBC has normalized but still having some fevers.  On day 6 Vanc / Zosyn.  No clear source except for some cellulitis at the J-tube exit site. Blood Cx negative at 2 days. Continue empiric treatment and wound care.   -Left pleural effusion- POD 5 thoracentesis by IR yielding 1L "hazy amber" fluid. Gram stain on pleural fluid showed Gm+ cocci in pairs, but Cx shows no growth at 2 days.  CT chest on 11/2 demonstrated a large posterior left pleural effusion.  IR placed an image-guided catheter into the left posterior effusion 11/3 and has drained over 1.7L since insertion.   -DVT PPX- cont enoxaparin daily.   -Hyperglycemia- mild, related to TF. No H/O DM. Still requires occasional insulin coverage so will continue the SSI.    [name] PA-C Pager [phone] 03/10/2020   Agree with above Chest tube still draining clear serous fluid WBC down Continue wound care to J tube site Tolerating clear diet  Ameka Krigbaum O Riyanna Crutchley

## 2020-03-10 NOTE — Progress Notes (Signed)
Physical Therapy Treatment Patient Details Name: Brett Dawson MRN: [id number] DOB: 08-20-1955 Today's Date: 03/10/2020    History of Present Illness 64 yo male with Esophageal cancer. s/p 02/24/20 robotic assisted Ivor-Lewis esophagectomy with jejunostomy tube placement. Pt with increasing shortness of breath and fever with chest tubes placed x 2 (11/1 and 11/2)    PT Comments    Pt seated in recliner reports increase in back pain and declined gt training.  Assisted patient back to bed to perform LE exercises.  Continue to recommend HHPT.    Follow Up Recommendations  Home health PT;Supervision/Assistance - 24 hour     Equipment Recommendations  None recommended by PT    Recommendations for Other Services       Precautions / Restrictions Precautions Precautions: Fall;ICD/Pacemaker Precaution Comments: chest tubes x 2, watch 02 Restrictions Weight Bearing Restrictions: No    Mobility  Bed Mobility Overal bed mobility: Needs Assistance Bed Mobility: Sit to Supine       Sit to supine: Supervision      Transfers Overall transfer level: Needs assistance Equipment used: None Transfers: Sit to/from [employer] Sit to Stand: Min guard Stand pivot transfers: Min guard       General transfer comment: Pt very effortful to achieve standing but no physical support needed .  Performed stand pivot back to bed.  Ambulation/Gait Ambulation/Gait assistance:  (refused gt due to back pain.)               Stairs             Wheelchair Mobility    Modified Rankin (Stroke Patients Only)       Balance     Sitting balance-Leahy Scale: Good       Standing balance-Leahy Scale: Fair                              Cognition Arousal/Alertness: Awake/alert Behavior During Therapy: WFL for tasks assessed/performed Overall Cognitive Status: Within Functional Limits for tasks assessed                                         Exercises General Exercises - Lower Extremity Ankle Circles/Pumps: AROM;Both;20 reps;Supine Quad Sets: AROM;Both;10 reps;Supine Heel Slides: AROM;Both;10 reps;Supine Hip ABduction/ADduction: AROM;Both;10 reps;Supine    General Comments        Pertinent Vitals/Pain Pain Assessment: Faces Faces Pain Scale: Hurts whole lot Pain Location: chest tube sites and back pain. Pain Descriptors / Indicators: Discomfort Pain Intervention(s): Monitored during session    Home Living                      Prior Function            PT Goals (current goals can now be found in the care plan section) Acute Rehab PT Goals Patient Stated Goal: get back to work Potential to Achieve Goals: Good Progress towards PT goals: Progressing toward goals    Frequency    Min 3X/week      PT Plan Current plan remains appropriate    Co-evaluation              AM-PAC PT "6 Clicks" Mobility   Outcome Measure  Help needed turning from your back to your side while in a flat bed without using bedrails?: None Help needed moving  from lying on your back to sitting on the side of a flat bed without using bedrails?: A Little Help needed moving to and from a bed to a chair (including a wheelchair)?: A Little Help needed standing up from a chair using your arms (e.g., wheelchair or bedside chair)?: A Little Help needed to walk in hospital room?: A Little Help needed climbing 3-5 steps with a railing? : A Little 6 Click Score: 19    End of Session Equipment Utilized During Treatment: Gait belt Activity Tolerance: Patient tolerated treatment well Patient left: in bed;with bed alarm set;with call bell/phone within reach Nurse Communication: Mobility status PT Visit Diagnosis: Other abnormalities of gait and mobility (R26.89)     Time: 1610-9604 PT Time Calculation (min) (ACUTE ONLY): 16 min  Charges:  $Therapeutic Activity: 8-22 mins                     Bonney Leitz , PTA Acute  Rehabilitation Services Pager [phone] Office [phone]     Richelle Glick Artis Delay 03/10/2020, 6:24 PM

## 2020-03-10 NOTE — Progress Notes (Signed)
Changed [place] for CT #2.

## 2020-03-10 NOTE — Plan of Care (Signed)

## 2020-03-10 NOTE — Progress Notes (Signed)
JP drain removed per order, patient tolerated well. Patient on bedrest for 1 hr.

## 2020-03-11 LAB — CULTURE, BODY FLUID W GRAM STAIN -BOTTLE

## 2020-03-11 LAB — CBC
HCT: 28.2 % — ABNORMAL LOW (ref 39.0–52.0)
Hemoglobin: 9.2 g/dL — ABNORMAL LOW (ref 13.0–17.0)
MCH: 30 pg (ref 26.0–34.0)
MCHC: 32.6 g/dL (ref 30.0–36.0)
MCV: 91.9 fL (ref 80.0–100.0)
Platelets: 325 10*3/uL (ref 150–400)
RBC: 3.07 MIL/uL — ABNORMAL LOW (ref 4.22–5.81)
RDW: 18.2 % — ABNORMAL HIGH (ref 11.5–15.5)
WBC: 5.5 10*3/uL (ref 4.0–10.5)
nRBC: 0 % (ref 0.0–0.2)

## 2020-03-11 LAB — GLUCOSE, CAPILLARY
Glucose-Capillary: 117 mg/dL — ABNORMAL HIGH (ref 70–99)
Glucose-Capillary: 127 mg/dL — ABNORMAL HIGH (ref 70–99)
Glucose-Capillary: 86 mg/dL (ref 70–99)
Glucose-Capillary: 113 mg/dL — ABNORMAL HIGH (ref 70–99)
Glucose-Capillary: 146 mg/dL — ABNORMAL HIGH (ref 70–99)

## 2020-03-11 LAB — BASIC METABOLIC PANEL
Anion gap: 7 (ref 5–15)
BUN: 8 mg/dL (ref 8–23)
CO2: 27 mmol/L (ref 22–32)
Calcium: 7.4 mg/dL — ABNORMAL LOW (ref 8.9–10.3)
Chloride: 99 mmol/L (ref 98–111)
Creatinine, Ser: 0.64 mg/dL (ref 0.61–1.24)
GFR, Estimated: >60 mL/min (ref >60)
Glucose, Bld: 115 mg/dL — ABNORMAL HIGH (ref 70–99)
Potassium: 3.5 mmol/L (ref 3.5–5.1)
Sodium: 133 mmol/L — ABNORMAL LOW (ref 135–145)

## 2020-03-11 MED ORDER — NYSTATIN 100000 UNIT/GM EX OINT
TOPICAL_OINTMENT | Freq: Two times a day (BID) | CUTANEOUS | Status: DC
  Administered 2020-03-11 – 2020-04-20 (×7): 1 via TOPICAL
  Filled 2020-03-11 (×2): qty 15

## 2020-03-11 NOTE — Progress Notes (Addendum)
      [address]       [place] [address]             [phone]      16 Days Post-Op Procedure(s) (LRB): XI ROBOTIC ASSISTED IVOR LEWIS ESOPHAGECTOMY (N/A) LAPARSCOPIC  JEJUNOSTOMY TUBE PLACEMENT (N/A) ESOPHAGOGASTRODUODENOSCOPY (EGD) (N/A) INTERCOSTAL NERVE BLOCK (Right)   Subjective:  Patient complains of shortness of breath this morning.  Pain is mostly controlled, but he does experience some musculoskeletal pain. Working with PT/OT without difficulty.  Objective: Vital signs in last 24 hours: Temp:  [98.8 F (37.1 C)-100.6 F (38.1 C)] 100.6 F (38.1 C) (11/06 0339) Pulse Rate:  [76-91] 91 (11/06 0339) Cardiac Rhythm: Normal sinus rhythm (11/06 0400) Resp:  [17-20] 17 (11/06 0339) BP: (98-122)/(55-75) 122/75 (11/06 0339) SpO2:  [92 %] 92 % (11/06 0339) Weight:  [95.1 kg] 95.1 kg (11/06 0700)  Intake/Output from previous day: 11/05 0701 - 11/06 0700 In: 2275.7 [NG/GT:2205.9; IV Piggyback:69.8] Out: 458 [Urine:200; Chest Tube:258]  General appearance: alert, cooperative and no distress Heart: regular rate and rhythm Lungs: clear to auscultation bilaterally Abdomen: soft, non-tender; bowel sounds normal; no masses,  no organomegaly and J Tube site with fungal type ring present, one suture is very red and has pulled through skin Wound: clean and dry  Lab Results: Recent Labs    03/10/20 0044 03/11/20 0112  WBC 7.2 5.5  HGB 9.3* 9.2*  HCT 29.6* 28.2*  PLT 347 325   BMET:  Recent Labs    03/10/20 0044 03/11/20 0112  NA 134* 133*  K 3.6 3.5  CL 98 99  CO2 27 27  GLUCOSE 127* 115*  BUN 11 8  CREATININE 0.72 0.64  CALCIUM 7.4* 7.4*    PT/INR: No results for input(s): LABPROT, INR in the last 72 hours. ABG    Component Value Date/Time   PHART 7.454 (H) 02/25/2020 0500   HCO3 26.5 02/25/2020 0500   TCO2 27 02/24/2020 1944   ACIDBASEDEF 1.0 02/24/2020 1547   O2SAT 93.7 02/25/2020 0500   CBG (last 3)  Recent Labs     03/10/20 2330 03/11/20 0338 03/11/20 0834  GLUCAP 130* 117* 127*    Assessment/Plan: S/P Procedure(s) (LRB): XI ROBOTIC ASSISTED IVOR LEWIS ESOPHAGECTOMY (N/A) LAPARSCOPIC  JEJUNOSTOMY TUBE PLACEMENT (N/A) ESOPHAGOGASTRODUODENOSCOPY (EGD) (N/A) INTERCOSTAL NERVE BLOCK (Right)  1. S/P Esophagectomy with placement of feeding tube-  Esophogram on 03/06/20 showed no evidence of leak.. patient on clear liquid diet w/o plans to advance currently, on tube feedings nocutrnally 2. Chest tubes x 2- 258 cc output between the 2, leave in place today.. CXR shows some improvement of pleural effusions 3. ID- febrile this morning, this has been occurring off and on.. no leukocytosis on Day 7 of Vanc/Zosyn for coverage.. blood cultures were negative.... he does have a J tube site infection, however this does appear yeast like in appearance, wound care has signed off, removed suture that pulled through the skin.. currently being treated with silvadene, will add nystatin  4. DVT prophylaxis on Lovenox    LOS: 16 days    Ellwood Handler, PA-C 03/11/2020  Patient seen  11/6- missed signing  Note- irritation aroung j tube  Not much draining from left chest tubes  I have seen and examined Brett Dawson and agree with the above assessment  and plan.  Grace Isaac MD Beeper [phone] Office [phone] 03/12/2020 11:39 AM

## 2020-03-11 NOTE — Plan of Care (Signed)
  Problem: Education: Goal: Knowledge of General Education information will improve Description: Including pain rating scale, medication(s)/side effects and non-pharmacologic comfort measures Outcome: Progressing   Problem: Health Behavior/Discharge Planning: Goal: Ability to manage health-related needs will improve Outcome: Progressing   Problem: Clinical Measurements: Goal: Ability to maintain clinical measurements within normal limits will improve Outcome: Progressing   Problem: Clinical Measurements: Goal: Diagnostic test results will improve Outcome: Progressing   Problem: Clinical Measurements: Goal: Will remain free from infection Outcome: Progressing   Problem: Activity: Goal: Risk for activity intolerance will decrease Outcome: Progressing   Problem: Nutrition: Goal: Adequate nutrition will be maintained Outcome: Progressing   Problem: Pain Managment: Goal: General experience of comfort will improve Outcome: Progressing   Problem: Skin Integrity: Goal: Risk for impaired skin integrity will decrease Outcome: Progressing

## 2020-03-12 LAB — GLUCOSE, CAPILLARY
Glucose-Capillary: 122 mg/dL — ABNORMAL HIGH (ref 70–99)
Glucose-Capillary: 124 mg/dL — ABNORMAL HIGH (ref 70–99)
Glucose-Capillary: 142 mg/dL — ABNORMAL HIGH (ref 70–99)
Glucose-Capillary: 153 mg/dL — ABNORMAL HIGH (ref 70–99)
Glucose-Capillary: 155 mg/dL — ABNORMAL HIGH (ref 70–99)
Glucose-Capillary: 85 mg/dL (ref 70–99)
Glucose-Capillary: 89 mg/dL (ref 70–99)

## 2020-03-12 NOTE — Progress Notes (Addendum)
[address]       [place] [address]             [phone]      17 Days Post-Op Procedure(s) (LRB): XI ROBOTIC ASSISTED IVOR LEWIS ESOPHAGECTOMY (N/A) LAPARSCOPIC  JEJUNOSTOMY TUBE PLACEMENT (N/A) ESOPHAGOGASTRODUODENOSCOPY (EGD) (N/A) INTERCOSTAL NERVE BLOCK (Right)   Subjective:  Patient doing okay.  No new complaints.  Has some back pain when he is in bed.  + BM,  Objective: Vital signs in last 24 hours: Temp:  [98.7 F (37.1 C)-99.5 F (37.5 C)] 98.7 F (37.1 C) (11/07 0915) Pulse Rate:  [75-99] 75 (11/07 0915) Cardiac Rhythm: Normal sinus rhythm (11/07 0358) Resp:  [18-21] 18 (11/07 0915) BP: (108-119)/(60-76) 116/76 (11/07 0915) SpO2:  [90 %-95 %] 92 % (11/07 0915)  Intake/Output from previous day: 11/06 0701 - 11/07 0700 In: 3312.9 [P.O.:240; NG/GT:1353.8; IV Piggyback:1719.1] Out: 930 [Urine:350; Chest Tube:580] Intake/Output this shift: Total I/O In: -  Out: 640 [Urine:500; Chest Tube:140]  General appearance: alert, cooperative and no distress Heart: regular rate and rhythm Lungs: diminished breath sounds bibasilar Abdomen: soft, non-tender, distended.. J Tube site remains erythematous however this may be somewhat improved Extremities: extremities normal, atraumatic, no cyanosis or edema Wound: healing well  Lab Results: Recent Labs    03/10/20 0044 03/11/20 0112  WBC 7.2 5.5  HGB 9.3* 9.2*  HCT 29.6* 28.2*  PLT 347 325   BMET:  Recent Labs    03/10/20 0044 03/11/20 0112  NA 134* 133*  K 3.6 3.5  CL 98 99  CO2 27 27  GLUCOSE 127* 115*  BUN 11 8  CREATININE 0.72 0.64  CALCIUM 7.4* 7.4*    PT/INR: No results for input(s): LABPROT, INR in the last 72 hours. ABG    Component Value Date/Time   PHART 7.454 (H) 02/25/2020 0500   HCO3 26.5 02/25/2020 0500   TCO2 27 02/24/2020 1944   ACIDBASEDEF 1.0 02/24/2020 1547   O2SAT 93.7 02/25/2020 0500   CBG (last 3)  Recent Labs    03/12/20 0059 03/12/20 0356  03/12/20 0821  GLUCAP 142* 153* 124*    Assessment/Plan: S/P Procedure(s) (LRB): XI ROBOTIC ASSISTED IVOR LEWIS ESOPHAGECTOMY (N/A) LAPARSCOPIC  JEJUNOSTOMY TUBE PLACEMENT (N/A) ESOPHAGOGASTRODUODENOSCOPY (EGD) (N/A) INTERCOSTAL NERVE BLOCK (Right)  1. CV- remains hemodynamically stable 2. Pulm- CT output 580 cc output yesterday.. posterior tube continues to have purulent drainage, anterior tube remains serous leave in place... will repeat CXR in AM 3. ID- afebrile this morning, J Tube site a little improved this morning, continue wound care, continue IV ABX.Marland Kitchen will repeat labs in AM 4. GI- tolerating CLD, nocturnal tube feedings denies N/V... no plans to advance diet 5. Lovenox for DVT prophylaxis 6. CBGs elevated at times due to tube feedings continue SSIP as needed   LOS: 17 days    Ellwood Handler, PA-C 03/12/2020  Recent Results (from the past 240 hour(s))  Culture, body fluid-bottle     Status: Abnormal   Collection Time: 03/05/20  2:20 PM   Specimen: Pleura  Result Value Ref Range Status   Specimen Description PLEURAL FLUID  Final   Special Requests LEFT CHEST  Final   Gram Stain   Final    GRAM NEGATIVE RODS IN BOTH AEROBIC AND ANAEROBIC BOTTLES CRITICAL RESULT CALLED TO, READ BACK BY AND VERIFIED WITH: RN M.SNIDER AT 36 ON [id number] BY T.SAAD    Culture (A)  Final    PROTEUS MIRABILIS BACTEROIDES INTERMEDIUS BETA  LACTAMASE POSITIVE Performed at [facility], [place] [address] [place], [place] [address]    Report Status 03/11/2020 FINAL  Final   Organism ID, Bacteria PROTEUS MIRABILIS  Final      Susceptibility   Proteus mirabilis - MIC*    AMPICILLIN <=2 SENSITIVE Sensitive     CEFAZOLIN 8 SENSITIVE Sensitive     CEFEPIME <=0.12 SENSITIVE Sensitive     CEFTAZIDIME <=1 SENSITIVE Sensitive     CEFTRIAXONE <=0.25 SENSITIVE Sensitive     CIPROFLOXACIN <=0.25 SENSITIVE Sensitive     GENTAMICIN <=1 SENSITIVE Sensitive     IMIPENEM 2 SENSITIVE Sensitive      TRIMETH/SULFA <=20 SENSITIVE Sensitive     AMPICILLIN/SULBACTAM <=2 SENSITIVE Sensitive     PIP/TAZO <=4 SENSITIVE Sensitive     * PROTEUS MIRABILIS  Urine culture     Status: None   Collection Time: 03/05/20  3:48 PM   Specimen: Urine, Clean Catch  Result Value Ref Range Status   Specimen Description URINE, CLEAN CATCH  Final   Special Requests Normal  Final   Culture   Final    NO GROWTH Performed at [facility], [place] [address], [place], [place] [address]    Report Status 03/06/2020 FINAL  Final  Culture, blood (Routine X 2) w Reflex to ID Panel     Status: None   Collection Time: 03/05/20  4:32 PM   Specimen: BLOOD  Result Value Ref Range Status   Specimen Description BLOOD BLOOD RIGHT HAND  Final   Special Requests   Final    BOTTLES DRAWN AEROBIC ONLY Blood Culture adequate volume   Culture   Final    NO GROWTH 5 DAYS Performed at [facility], [place] [address] [place], [place] [address]    Report Status 03/10/2020 FINAL  Final  Culture, blood (Routine X 2) w Reflex to ID Panel     Status: None   Collection Time: 03/05/20  4:32 PM   Specimen: BLOOD  Result Value Ref Range Status   Specimen Description BLOOD BLOOD LEFT HAND  Final   Special Requests   Final    BOTTLES DRAWN AEROBIC ONLY Blood Culture results may not be optimal due to an inadequate volume of blood received in culture bottles   Culture   Final    NO GROWTH 5 DAYS Performed at [facility], [place] [address] [place], [place] [address]    Report Status 03/10/2020 FINAL  Final   PROTEUS MIRABILIS BACTEROIDES INTERMEDIUS BETA LACTAMASE POSITIVE  Stop vancomycin Leave chest tubes in place I have seen and examined Brett Dawson and agree with the above assessment  and plan.  Grace Isaac MD Beeper [phone] Office [phone] 03/12/2020 11:42 AM

## 2020-03-12 NOTE — Plan of Care (Signed)
  Problem: Education: Goal: Knowledge of General Education information will improve Description: Including pain rating scale, medication(s)/side effects and non-pharmacologic comfort measures Outcome: Progressing   Problem: Clinical Measurements: Goal: Ability to maintain clinical measurements within normal limits will improve Outcome: Progressing   Problem: Clinical Measurements: Goal: Will remain free from infection Outcome: Progressing   Problem: Clinical Measurements: Goal: Diagnostic test results will improve Outcome: Progressing   Problem: Nutrition: Goal: Adequate nutrition will be maintained Outcome: Progressing   Problem: Pain Managment: Goal: General experience of comfort will improve Outcome: Progressing   Problem: Safety: Goal: Ability to remain free from injury will improve Outcome: Progressing   Problem: Skin Integrity: Goal: Risk for impaired skin integrity will decrease Outcome: Progressing

## 2020-03-13 ENCOUNTER — Inpatient Hospital Stay (HOSPITAL_COMMUNITY): Payer: 59

## 2020-03-13 LAB — CBC
HCT: 29.3 % — ABNORMAL LOW (ref 39.0–52.0)
Hemoglobin: 9.4 g/dL — ABNORMAL LOW (ref 13.0–17.0)
MCH: 29.4 pg (ref 26.0–34.0)
MCHC: 32.1 g/dL (ref 30.0–36.0)
MCV: 91.6 fL (ref 80.0–100.0)
Platelets: 314 10*3/uL (ref 150–400)
RBC: 3.2 MIL/uL — ABNORMAL LOW (ref 4.22–5.81)
RDW: 17.9 % — ABNORMAL HIGH (ref 11.5–15.5)
WBC: 7.3 10*3/uL (ref 4.0–10.5)
nRBC: 0 % (ref 0.0–0.2)

## 2020-03-13 LAB — BASIC METABOLIC PANEL
Anion gap: 10 (ref 5–15)
BUN: 8 mg/dL (ref 8–23)
CO2: 23 mmol/L (ref 22–32)
Calcium: 7.3 mg/dL — ABNORMAL LOW (ref 8.9–10.3)
Chloride: 100 mmol/L (ref 98–111)
Creatinine, Ser: 0.53 mg/dL — ABNORMAL LOW (ref 0.61–1.24)
GFR, Estimated: >60 mL/min (ref >60)
Glucose, Bld: 146 mg/dL — ABNORMAL HIGH (ref 70–99)
Potassium: 3.9 mmol/L (ref 3.5–5.1)
Sodium: 133 mmol/L — ABNORMAL LOW (ref 135–145)

## 2020-03-13 LAB — GLUCOSE, CAPILLARY
Glucose-Capillary: 137 mg/dL — ABNORMAL HIGH (ref 70–99)
Glucose-Capillary: 155 mg/dL — ABNORMAL HIGH (ref 70–99)
Glucose-Capillary: 130 mg/dL — ABNORMAL HIGH (ref 70–99)
Glucose-Capillary: 115 mg/dL — ABNORMAL HIGH (ref 70–99)
Glucose-Capillary: 163 mg/dL — ABNORMAL HIGH (ref 70–99)

## 2020-03-13 MED ORDER — SALINE SPRAY 0.65 % NA SOLN
1.0000 | NASAL | Status: DC | PRN
  Administered 2020-03-13: 1 via NASAL
  Filled 2020-03-13: qty 44

## 2020-03-13 NOTE — Progress Notes (Signed)
Pharmacy Antibiotic Note  Brett Dawson is a 64 y.o. male admitted on 02/24/2020 s/p esophagectomy on Zosyn (day 9) for proteus and bacteroides (beta-lactamase positive) PNA -WBC= 7.3, afebrile, CrCl > 100   Plan: Continue Zosyn 3.375g IV EI q8h Will follow plans for length of therapy    Height: 5\' 11"  (180.3 cm) Weight: 93.5 kg (206 lb 3.2 oz) IBW/kg (Calculated) : 75.3  Temp (24hrs), Avg:99.1 F (37.3 C), Min:98.4 F (36.9 C), Max:99.9 F (37.7 C)  Recent Labs  Lab 03/08/20 0015 03/09/20 0027 03/10/20 0044 03/10/20 1355 03/11/20 0112 03/13/20 0045  WBC 12.6* 8.9 7.2  --  5.5 7.3  CREATININE 0.72 0.77 0.72  --  0.64 0.53*  VANCOTROUGH  --   --   --  11*  --   --     Estimated Creatinine Clearance: 110.4 mL/min (A) (by C-G formula based on SCr of 0.53 mg/dL (L)).    No Known Allergies  Antimicrobials this admission: Vanc 10/31> Zosyn 10/31> 11/7   Microbiology results: 10/31 Pleural fluid:  proteus (beta lactamase +, bacteroides)  10/31 UCx: negative 10/31 BCx: Neg  Hildred Laser, PharmD Clinical Pharmacist **Pharmacist phone directory can now be found on [email or web address] ([email or web address]).  Listed under [place].

## 2020-03-13 NOTE — Progress Notes (Signed)
Occupational Therapy Treatment Patient Details Name: Brett Dawson MRN: [id number] DOB: [birth year] Today's Date: 03/13/2020    History of present illness 64 yo male with Esophageal cancer. s/p 02/24/20 robotic assisted Ivor-Lewis esophagectomy with jejunostomy tube placement. Pt with increasing shortness of breath and fever with chest tubes placed x 2 (11/1 and 11/2)   OT comments  Pt continues to be self limiting. Educated in importance of OOB activity. Participated in dressing and standing grooming, but declined remaining up in chair at end of session. Pt reports he will be having one chest tube removed and was told he had to be in the bed.   Pt stating his home is not conducive for a feeding tube pole or using a RW. Pt does not have any assistance at home as well. May need to consider SNF.    Follow Up Recommendations  Home health OT;Supervision/Assistance - 24 hour    Equipment Recommendations  3 in 1 bedside commode    Recommendations for Other Services      Precautions / Restrictions Precautions Precautions: Fall;Other (comment) Precaution Comments: chest tubes x 2, watch 02       Mobility Bed Mobility Overal bed mobility: Needs Assistance Bed Mobility: Supine to Sit;Sit to Supine     Supine to sit: Min assist Sit to supine: Supervision   General bed mobility comments: pulled up on therapist's hand  Transfers Overall transfer level: Needs assistance Equipment used: None Transfers: Sit to/from Stand Sit to Stand: Min guard         General transfer comment: did not use RW in the confines of his room    Balance Overall balance assessment: Needs assistance   Sitting balance-Leahy Scale: Good       Standing balance-Leahy Scale: Fair Standing balance comment: statically                           ADL either performed or assessed with clinical judgement   ADL Overall ADL's : Needs assistance/impaired     Grooming: Wash/dry hands;Wash/dry  face;Standing;Min guard               Lower Body Dressing: Supervision/safety;Sitting/lateral leans Lower Body Dressing Details (indicate cue type and reason): socks Toilet Transfer: Min guard;Ambulation;RW   Toileting- Water quality scientist and Hygiene: Min guard;Sit to/from stand       Functional mobility during ADLs: Min guard;Rolling walker General ADL Comments: continued educating pt in importance of mobilizing and up to chair      Vision       Perception     Praxis      Cognition Arousal/Alertness: Awake/alert Behavior During Therapy: WFL for tasks assessed/performed Overall Cognitive Status: Within Functional Limits for tasks assessed                                          Exercises     Shoulder Instructions       General Comments      Pertinent Vitals/ Pain       Pain Assessment: Faces Faces Pain Scale: Hurts little more Pain Location: chest tube sites Pain Descriptors / Indicators: Discomfort;Grimacing;Guarding Pain Intervention(s): Monitored during session;Repositioned  Home Living  Prior Functioning/Environment              Frequency  Min 2X/week        Progress Toward Goals  OT Goals(current goals can now be found in the care plan section)  Progress towards OT goals: Progressing toward goals  Acute Rehab OT Goals Patient Stated Goal: get back to work OT Goal Formulation: With patient Time For Goal Achievement: 03/27/20 Potential to Achieve Goals: Good  Plan Discharge plan needs to be updated    Co-evaluation                 AM-PAC OT "6 Clicks" Daily Activity     Outcome Measure   Help from another person eating meals?: None Help from another person taking care of personal grooming?: A Little Help from another person toileting, which includes using toliet, bedpan, or urinal?: A Little Help from another person bathing (including washing,  rinsing, drying)?: A Little Help from another person to put on and taking off regular upper body clothing?: A Little Help from another person to put on and taking off regular lower body clothing?: A Little 6 Click Score: 19    End of Session Equipment Utilized During Treatment: Oxygen;Gait belt (5L)  OT Visit Diagnosis: Muscle weakness (generalized) (M62.81)   Activity Tolerance Patient tolerated treatment well   Patient Left in bed;with call bell/phone within reach (declined remaining in chair, anticipating chest tube removal)   Nurse Communication          Time: 8682-5749 OT Time Calculation (min): 20 min  Charges: OT General Charges $OT Visit: 1 Visit OT Treatments $Self Care/Home Management : 8-22 mins  Nestor Lewandowsky, OTR/L Acute Rehabilitation Services Pager: [phone] Office: [phone]   Malka So 03/13/2020, 11:03 AM

## 2020-03-13 NOTE — Progress Notes (Addendum)
      [address]       ,[place] [address]             [phone]      18 Days Post-Op Procedure(s) (LRB): XI ROBOTIC ASSISTED IVOR LEWIS ESOPHAGECTOMY (N/A) LAPARSCOPIC  JEJUNOSTOMY TUBE PLACEMENT (N/A) ESOPHAGOGASTRODUODENOSCOPY (EGD) (N/A) INTERCOSTAL NERVE BLOCK (Right) Subjective: Feels short of breath when going to the bathroom.   Objective: Vital signs in last 24 hours: Temp:  [98.4 F (36.9 C)-99 F (37.2 C)] 99 F (37.2 C) (11/08 0405) Pulse Rate:  [73-89] 81 (11/08 0405) Cardiac Rhythm: Normal sinus rhythm (11/08 0405) Resp:  [17-20] 20 (11/08 0405) BP: (101-120)/(61-76) 101/61 (11/08 0405) SpO2:  [90 %-95 %] 93 % (11/08 0405) Weight:  [93.5 kg] 93.5 kg (11/08 0422)     Intake/Output from previous day: 11/07 0701 - 11/08 0700 In: 2153.2 [P.O.:480; NG/GT:1523.2; IV Piggyback:150] Out: 2350 [Urine:950; Chest Tube:1400] Intake/Output this shift: No intake/output data recorded.  General appearance: alert, cooperative and no distress Heart: regular rate and rhythm, S1, S2 normal, no murmur, click, rub or gallop Lungs: clear to auscultation bilaterally Abdomen: soft, non-tender; bowel sounds normal; no masses,  no organomegaly Extremities: + edema lower ext Wound: clean and dry  Lab Results: Recent Labs    03/11/20 0112 03/13/20 0045  WBC 5.5 7.3  HGB 9.2* 9.4*  HCT 28.2* 29.3*  PLT 325 314   BMET:  Recent Labs    03/11/20 0112 03/13/20 0045  NA 133* 133*  K 3.5 3.9  CL 99 100  CO2 27 23  GLUCOSE 115* 146*  BUN 8 8  CREATININE 0.64 0.53*  CALCIUM 7.4* 7.3*    PT/INR: No results for input(s): LABPROT, INR in the last 72 hours. ABG    Component Value Date/Time   PHART 7.454 (H) 02/25/2020 0500   HCO3 26.5 02/25/2020 0500   TCO2 27 02/24/2020 1944   ACIDBASEDEF 1.0 02/24/2020 1547   O2SAT 93.7 02/25/2020 0500   CBG (last 3)  Recent Labs    03/12/20 2002 03/12/20 2343 03/13/20 0418  GLUCAP 155* 122* 137*     Assessment/Plan: S/P Procedure(s) (LRB): XI ROBOTIC ASSISTED IVOR LEWIS ESOPHAGECTOMY (N/A) LAPARSCOPIC  JEJUNOSTOMY TUBE PLACEMENT (N/A) ESOPHAGOGASTRODUODENOSCOPY (EGD) (N/A) INTERCOSTAL NERVE BLOCK (Right)  1. CV- NSR in the 80s, BP low normal 2. Pulm- tolerating 5L [place]. CXR with left pleural effusions vs. Atelectasis. Keep chest tubes, output still too high.  3. Renal-creatinine 0.53, electrolytes okay 4. Endo-blood glucose well controlled 5. GI-tolerating nocturnal tube feeds. Continue clear liquid diet 6. ID-WBC within normal limits. No recent fevers. Continue zosyn 7. Lovenox for DVT prophylaxis 8. Jtube site with some yellow/green drainage. On abx.  Plan: Continue medical care, encouraged to ambulate in the halls. Leave chest tubes until output decreases. On expectorant already through the tube.    LOS: 18 days    Elgie Collard 03/13/2020   Unclear reason for CT output.   Will get another swallow to eval Will send for drain triglycerides  Will make NPO for now  Laini Urick [employer]

## 2020-03-13 NOTE — Progress Notes (Signed)
Physical Therapy Treatment Patient Details Name: Brett Dawson MRN: [id number] DOB: Dec 16, 1955 Today's Date: 03/13/2020    History of Present Illness 64 yo male with Esophageal cancer. s/p 02/24/20 robotic assisted Ivor-Lewis esophagectomy with jejunostomy tube placement. Pt with increasing shortness of breath and fever with chest tubes placed x 2 (11/1 and 11/2)    PT Comments    Pt motivated for gt progression this session.  SPO2 stable on 6L [place] >90 %.  Plan for HHPT remains appropriate at this time.     Follow Up Recommendations  Home health PT;Supervision/Assistance - 24 hour     Equipment Recommendations  None recommended by PT    Recommendations for Other Services       Precautions / Restrictions Precautions Precautions: Fall;Other (comment) Precaution Comments: chest tubes x 2, watch 02 Restrictions Weight Bearing Restrictions: No    Mobility  Bed Mobility Overal bed mobility: Needs Assistance Bed Mobility: Supine to Sit     Supine to sit: Supervision        Transfers Overall transfer level: Needs assistance Equipment used: Rolling walker (2 wheeled) Transfers: Sit to/from Stand Sit to Stand: Supervision         General transfer comment: Cues for safety.  Ambulation/Gait Ambulation/Gait assistance: Min guard Gait Distance (Feet): 120 Feet Assistive device: Rolling walker (2 wheeled) Gait Pattern/deviations: Step-through pattern;Shuffle;Narrow base of support;Trunk flexed Gait velocity: slowed   General Gait Details: Cues for scap retraction, upper trunk control and lowering shoulders.  RW appears to low but he reports it's comfortable and decline adjustment.   Stairs             Wheelchair Mobility    Modified Rankin (Stroke Patients Only)       Balance Overall balance assessment: Needs assistance Sitting-balance support: Feet supported;No upper extremity supported Sitting balance-Leahy Scale: Good       Standing balance-Leahy  Scale: Fair                              Cognition Arousal/Alertness: Awake/alert Behavior During Therapy: WFL for tasks assessed/performed Overall Cognitive Status: Within Functional Limits for tasks assessed                                        Exercises      General Comments        Pertinent Vitals/Pain Pain Assessment: Faces Faces Pain Scale: Hurts little more Pain Location: chest tube sites Pain Descriptors / Indicators: Discomfort;Grimacing;Guarding Pain Intervention(s): Monitored during session;Repositioned    Home Living                      Prior Function            PT Goals (current goals can now be found in the care plan section) Acute Rehab PT Goals Patient Stated Goal: get back to work Potential to Achieve Goals: Good Progress towards PT goals: Progressing toward goals    Frequency    Min 3X/week      PT Plan Current plan remains appropriate    Co-evaluation              AM-PAC PT "6 Clicks" Mobility   Outcome Measure  Help needed turning from your back to your side while in a flat bed without using bedrails?: None Help needed moving from lying  on your back to sitting on the side of a flat bed without using bedrails?: A Little Help needed moving to and from a bed to a chair (including a wheelchair)?: A Little Help needed standing up from a chair using your arms (e.g., wheelchair or bedside chair)?: A Little Help needed to walk in hospital room?: A Little Help needed climbing 3-5 steps with a railing? : A Little 6 Click Score: 19    End of Session Equipment Utilized During Treatment: Gait belt Activity Tolerance: Patient tolerated treatment well Patient left: with call bell/phone within reach;in chair Nurse Communication: Mobility status PT Visit Diagnosis: Other abnormalities of gait and mobility (R26.89)     Time: 3154-0086 PT Time Calculation (min) (ACUTE ONLY): 31 min  Charges:  $Gait  Training: 8-22 mins $Therapeutic Activity: 8-22 mins                     Erasmo Leventhal , PTA Acute Rehabilitation Services Pager [phone] Office [place] 03/13/2020, 3:24 PM

## 2020-03-13 NOTE — Plan of Care (Signed)
  Problem: Education: Goal: Knowledge of General Education information will improve Description: Including pain rating scale, medication(s)/side effects and non-pharmacologic comfort measures Outcome: Progressing   Problem: Health Behavior/Discharge Planning: Goal: Ability to manage health-related needs will improve Outcome: Progressing   Problem: Clinical Measurements: Goal: Ability to maintain clinical measurements within normal limits will improve Outcome: Progressing   Problem: Clinical Measurements: Goal: Diagnostic test results will improve Outcome: Progressing   Problem: Clinical Measurements: Goal: Respiratory complications will improve Outcome: Progressing   Problem: Activity: Goal: Risk for activity intolerance will decrease Outcome: Progressing   Problem: Nutrition: Goal: Adequate nutrition will be maintained Outcome: Progressing   Problem: Pain Managment: Goal: General experience of comfort will improve Outcome: Progressing   Problem: Skin Integrity: Goal: Risk for impaired skin integrity will decrease Outcome: Progressing   

## 2020-03-14 LAB — GLUCOSE, CAPILLARY
Glucose-Capillary: 130 mg/dL — ABNORMAL HIGH (ref 70–99)
Glucose-Capillary: 132 mg/dL — ABNORMAL HIGH (ref 70–99)
Glucose-Capillary: 134 mg/dL — ABNORMAL HIGH (ref 70–99)
Glucose-Capillary: 138 mg/dL — ABNORMAL HIGH (ref 70–99)
Glucose-Capillary: 146 mg/dL — ABNORMAL HIGH (ref 70–99)
Glucose-Capillary: 152 mg/dL — ABNORMAL HIGH (ref 70–99)
Glucose-Capillary: 99 mg/dL (ref 70–99)

## 2020-03-14 MED ORDER — OXYMETAZOLINE HCL 0.05 % NA SOLN
1.0000 | Freq: Two times a day (BID) | NASAL | Status: DC
  Administered 2020-03-14 – 2020-03-16 (×4): 1 via NASAL
  Filled 2020-03-14: qty 30

## 2020-03-14 MED ORDER — PROSOURCE TF PO LIQD
45.0000 mL | Freq: Two times a day (BID) | ORAL | Status: DC
  Administered 2020-03-14 – 2020-03-16 (×5): 45 mL
  Filled 2020-03-14 (×5): qty 45

## 2020-03-14 NOTE — Progress Notes (Signed)
Nutrition Follow-up  DOCUMENTATION CODES:   Non-severe (moderate) malnutrition in context of chronic illness  INTERVENTION:  Continue nocturnal tube feeds via J-tube: - Osmolite 1.5 @ 95 ml/hr x 16 hours from 1800 to 1000 (total of 1520 ml) - ProSource TF 45 ml BID  Nocturnal tube feeding regimen provides2360kcal(100% of kcal needs),117grams of protein(100% of protein needs), and1148m of H2O.  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  Ongoing  GOAL:   Patient will meet greater than or equal to 90% of their needs  Met via TF  MONITOR:   Diet advancement, Labs, Weight trends, Skin, I & O's  REASON FOR ASSESSMENT:   Consult Enteral/tube feeding initiation and management  ASSESSMENT:   64year old male who presented on 10/21 for Ivor Lewis esophagectomy and J-tube placement. 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  10/22 - TF initiated 10/26 - swallow study, no evidence of leak, NGT removed 10/27 - MBS, diet advanced to clears, transitioned to nocturnal TF 10/31 - s/p thoracentesis with 1 L fluid removed 11/01 - chest tube placed, NPO 11/03 - left chest tube placed by IR 11/04 - clear liquids 11/05 - JP drain removed  Spoke with pt at bedside. Pt reports that he is tolerating his tube feeds without issue. He denies any abdominal pain, bloating, or nausea. Pt states that his dressing around his J-tube is wet. RD visualized dressing and surrounding area and no moisture noted.  Discussed pt with RN. No nutrition-related issues at this time. Pt drinking some juice and sweet tea.  Noted concern for chyle leak in CTS note yesterday.  Discussed with CTS PA who reports chest tube fluid is considered to be serous and not consistent with a chyle leak.  Weight trending down. RD will add additional ProSource to increase kcal and protein from TF regimen. Will continue to monitor weights.  Current TF: Osmolite 1.5 @ 95 ml/hr x 16 hours from 1800 to 1000, ProSource TF 45 ml daily  Admit weight: 99.6 kg Current weight:93.5 kg  Medications reviewed and include: SSI q 4 hours, nystatin, IV protonix, IV abx  Labs reviewed: sodium 133, hemoglobin 9.4 CBG's: 115-163 x 24 hours  UOP: 850 ml x 24 hours CT: 1650 ml x 24 hours I/O's: +16.3 L since admit  Diet Order:   Diet Order            Diet clear liquid Room service appropriate? Yes; Fluid consistency: Thin  Diet effective now                 EDUCATION NEEDS:   Education needs have been addressed  Skin:  Skin Assessment: Skin Integrity Issues: Incisions: chest, adomen  Last BM:  03/12/20 type 7  Height:   Ht Readings from Last 1 Encounters:  02/24/20 _0  (1.803 m)    Weight:   Wt Readings from Last 1 Encounters:  03/13/20 93.5 kg    Ideal Body Weight:  78.2 kg  BMI:  Body mass index is 28.76 kg/m.  Estimated Nutritional Needs:   Kcal:  [address] Protein:  110-130 grams  Fluid:  >/= 2.0 L    KGaynell Face MS, RD, LDN Inpatient Clinical Dietitian Please see AMiON for contact information.

## 2020-03-14 NOTE — Progress Notes (Signed)
Referring Physician(s): Dt. Kipp Brood  Supervising Physician: Jacqulynn Cadet  Patient Status:  [facility] - In-pt  Chief Complaint:  Left sided hydropneumothorax s/p left sided chest tube placed on 11.3.21 by Dr. Laurence Ferrari  Subjective:  Patient alert and sitting in the recliner calm and comfortable. Denies complaints at this time. Patient states that over all he is feeling better.    Allergies: Patient has no known allergies.  Medications: Prior to Admission medications   Medication Sig Start Date End Date Taking? Authorizing Provider  Colchicine 0.6 MG CAPS Take 0.6 mg by mouth 2 (two) times daily as needed (gout).  11/02/19  Yes [provider]  FEROSUL 325 (65 Fe) MG tablet Take 1 tablet (325 mg total) by mouth daily. Patient not taking: Reported on 02/21/2020 12/13/19   [name], MD  ondansetron (ZOFRAN) 8 MG tablet Take 1 tablet (8 mg total) by mouth 2 (two) times daily as needed for refractory nausea / vomiting. Start on day 3 after chemo. 11/27/19   [name], MD  prochlorperazine (COMPAZINE) 10 MG tablet TAKE 1 TABLET(10 MG) BY MOUTH EVERY 6 HOURS AS NEEDED FOR NAUSEA OR VOMITING Patient taking differently: Take 10 mg by mouth every 6 (six) hours as needed for nausea or vomiting.  12/13/19   [name], MD  sucralfate (CARAFATE) 1 g tablet Take 1 tablet (1 g total) by mouth 4 (four) times daily. Dissolve each tablet in 15 cc water before use. Patient taking differently: Take 1 g by mouth 4 (four) times daily as needed (throat pain). Dissolve each tablet in 15 cc water before use. 12/24/19   Kyung Rudd, MD     Vital Signs: BP 106/63 (BP Location: Right Arm)   Pulse 92   Temp 99.3 F (37.4 C) (Oral)   Resp 20   Ht 5\' 11"  (1.803 m)   Wt 206 lb 3.2 oz (93.5 kg)   SpO2 92%   BMI 28.76 kg/m   Physical Exam Vitals and nursing note reviewed.  Constitutional:      Appearance: He is well-developed.  HENT:     Head: Normocephalic.  Cardiovascular:     Rate and  Rhythm: Normal rate and regular rhythm.  Pulmonary:     Effort: Pulmonary effort is normal.     Comments: 2 left sided chest tube with serosanguinous output. 700 ml noted in the atrium device. No air leak noted. On nasal cannula Musculoskeletal:        General: Normal range of motion.     Cervical back: Normal range of motion.  Skin:    General: Skin is dry.  Neurological:     Mental Status: He is alert and oriented to person, place, and time.     Imaging: DG CHEST PORT 1 VIEW  Result Date: 03/13/2020 CLINICAL DATA:  Chest tube present.  Shortness of breath EXAM: PORTABLE CHEST 1 VIEW COMPARISON:  March 10, 2020 FINDINGS: Chest tubes present on the left, unchanged. Pneumothorax on the left is smaller with trace pneumothorax remaining on the left. Right chest tube no longer present. There is a skin fold on the right. There is ill-defined airspace opacity in both lower lung regions with bilateral pleural effusions. Heart is upper normal in size with pulmonary vascularity normal, stable. No adenopathy. No bone lesions. There is calcification in the left carotid artery. IMPRESSION: Chest tube on the right has been removed. There are chest tubes on the left, unchanged in position. Trace apical pneumothorax on the left. Fairly small  pleural effusions bilaterally with airspace opacity likely representing multifocal pneumonia in the base regions. Stable cardiac silhouette. Electronically Signed   By: Lowella Grip III M.D.   On: 03/13/2020 07:55    Labs:  CBC: Recent Labs    03/09/20 0027 03/10/20 0044 03/11/20 0112 03/13/20 0045  WBC 8.9 7.2 5.5 7.3  HGB 9.4* 9.3* 9.2* 9.4*  HCT 29.6* 29.6* 28.2* 29.3*  PLT 379 347 325 314    COAGS: Recent Labs    09/18/19 0808 02/22/20 0830 03/08/20 0015  INR 1.2 1.0 1.1  APTT  --  30  --     BMP: Recent Labs    12/27/19 0810 12/27/19 0810 01/03/20 0813 01/03/20 0813 01/11/20 0814 01/11/20 0814 01/26/20 1426 02/22/20 0830  03/09/20 0027 03/10/20 0044 03/11/20 0112 03/13/20 0045  NA 136   < > 140   < > 139   < > 140   < > 136 134* 133* 133*  K 4.0   < > 3.9   < > 4.2   < > 4.2   < > 4.0 3.6 3.5 3.9  CL 107   < > 106   < > 105   < > 106   < > 99 98 99 100  CO2 23   < > 27   < > 29   < > 29   < > 27 27 27 23   GLUCOSE 117*   < > 127*   < > 105*   < > 99   < > 119* 127* 115* 146*  BUN 14   < > 12   < > 11   < > 10   < > 14 11 8 8   CALCIUM 9.4   < > 9.4   < > 9.0   < > 8.8*   < > 7.8* 7.4* 7.4* 7.3*  CREATININE 0.66   < > 0.63   < > 0.68   < > 0.71   < > 0.77 0.72 0.64 0.53*  GFRNONAA >60   < > >60   < > >60   < > >60   < > >60 >60 >60 >60  GFRAA >60  --  >60  --  >60  --  >60  --   --   --   --   --    < > = values in this interval not displayed.    LIVER FUNCTION TESTS: Recent Labs    01/03/20 0813 01/11/20 0814 01/26/20 1426 02/22/20 0830  BILITOT 0.2* <0.2* 0.3 0.5  AST 29 37 29 29  ALT 34 49* 21 19  ALKPHOS 64 71 96 73  PROT 6.1* 6.4* 6.3* 6.8  ALBUMIN 3.1* 3.1* 3.1* 3.7    Assessment and Plan:  64 y.o. male inpatient. History of esophageal cancer s/p esophagectomy and jejunostomy tube  placement on 10.21.21 with subsequent left sided hydropneumothorax. IR placed a  12 Fr eft sided chest tube on 11.3.2 (Drain # 2). The tube connects to the atrium with a surgical placed left sided chest tube placed on 11.1.21 (Drain # 1 )  700 ml of sanguinous of noted to be in the atrium. Per EPIC output is 1650 ml, 1360 ml, 1400 ml. No air leak noted.   Chest xray from 11.8.21 reads Chest tube on the right has been removed. There are chest tubes on the left, unchanged in position. Trace apical pneumothorax on the left. Fairly small pleural effusions bilaterally with airspace opacity likely representing  multifocal pneumonia in the base regions. Stable cardiac silhouette.No leukocytosis. Last recorded temperature is 99.3   IR will continue to follow along - plans per TCTS,  Electronically Signed: Jacqualine Mau, NP 03/14/2020, 11:16 AM   I spent a total of 15 Minutes at the patient's bedside AND on the patient's hospital floor or unit, greater than 50% of which was counseling/coordinating care for left sided chest tube (drain # 2)

## 2020-03-14 NOTE — Progress Notes (Addendum)
      [address]       [place] [address]             [phone]      19 Days Post-Op Procedure(s) (LRB): XI ROBOTIC ASSISTED IVOR LEWIS ESOPHAGECTOMY (N/A) LAPARSCOPIC  JEJUNOSTOMY TUBE PLACEMENT (N/A) ESOPHAGOGASTRODUODENOSCOPY (EGD) (N/A) INTERCOSTAL NERVE BLOCK (Right) Subjective: Breathing is about the same, thinks he has some sinus congestion from the oxygen flow.   Objective: Vital signs in last 24 hours: Temp:  [98.4 F (36.9 C)-101.1 F (38.4 C)] 99.3 F (37.4 C) (11/09 0703) Pulse Rate:  [79-92] 92 (11/09 0703) Cardiac Rhythm: Normal sinus rhythm (11/09 0735) Resp:  [18-21] 20 (11/09 0703) BP: (105-121)/(61-94) 106/63 (11/09 0703) SpO2:  [91 %-94 %] 92 % (11/09 0703)     Intake/Output from previous day: 11/08 0701 - 11/09 0700 In: 1704.1 [P.O.:240; NG/GT:1284.1; IV Piggyback:150] Out: 2500 [Urine:850; Chest Tube:1650] Intake/Output this shift: Total I/O In: -  Out: 80 [Chest Tube:80]  General appearance: alert, cooperative and no distress Heart: regular rate and rhythm Lungs: clear to auscultation bilaterally Abdomen: soft, bowel sounds normal; tenderness a J-tube exit site Extremities: + edema lower ext Wound: port incisions clean and dry. Skin around the J-Tube is inflamed and tender.   Lab Results: Recent Labs    03/13/20 0045  WBC 7.3  HGB 9.4*  HCT 29.3*  PLT 314   BMET:  Recent Labs    03/13/20 0045  NA 133*  K 3.9  CL 100  CO2 23  GLUCOSE 146*  BUN 8  CREATININE 0.53*  CALCIUM 7.3*    PT/INR: No results for input(s): LABPROT, INR in the last 72 hours. ABG    Component Value Date/Time   PHART 7.454 (H) 02/25/2020 0500   HCO3 26.5 02/25/2020 0500   TCO2 27 02/24/2020 1944   ACIDBASEDEF 1.0 02/24/2020 1547   O2SAT 93.7 02/25/2020 0500   CBG (last 3)  Recent Labs    03/14/20 0342 03/14/20 0433 03/14/20 0737  GLUCAP 132* 146* 130*    Assessment/Plan: S/P Procedure(s) (LRB): XI ROBOTIC ASSISTED  IVOR LEWIS ESOPHAGECTOMY (N/A) LAPARSCOPIC  JEJUNOSTOMY TUBE PLACEMENT (N/A) ESOPHAGOGASTRODUODENOSCOPY (EGD) (N/A) INTERCOSTAL NERVE BLOCK (Right)  1. CV- NSR in the 80s, BP stable 2. Pulm- tolerating 5L . CXR with left pleural effusions vs. Atelectasis. Keep chest tubes, output still too high.  3. Renal-creatinine 0.53, electrolytes okay 4. Endo-blood glucose well controlled, stop glucometers.  5. GI-tolerating nocturnal tube feeds. Continue clear liquid diet 6. ID-WBC within normal limits. No recent fevers. Continue zosyn 7. Lovenox for DVT prophylaxis 8. Jtube site with some yellow/green drainage. On abx.  Plan: Continue supportive care, encouraged to ambulate in the halls. Leave chest tubes until output decreases. Had several cups of grape juice PO yesterday with no evidence of anastomotic leak. On expectorant already through the tube.    LOS: 19 days    [name], PA-C 03/14/2020  Agree with above. Continue chest tube drainage Continue tube feeds Continue physical therapy.  Morine Kohlman [name]

## 2020-03-15 ENCOUNTER — Inpatient Hospital Stay: Payer: 59

## 2020-03-15 ENCOUNTER — Inpatient Hospital Stay: Payer: 59 | Admitting: Hematology

## 2020-03-15 ENCOUNTER — Other Ambulatory Visit: Payer: Self-pay | Admitting: *Deleted

## 2020-03-15 LAB — GLUCOSE, CAPILLARY
Glucose-Capillary: 120 mg/dL — ABNORMAL HIGH (ref 70–99)
Glucose-Capillary: 162 mg/dL — ABNORMAL HIGH (ref 70–99)

## 2020-03-15 MED ORDER — AMOXICILLIN-POT CLAVULANATE 600-42.9 MG/5ML PO SUSR
875.0000 mg | Freq: Two times a day (BID) | ORAL | Status: DC
  Administered 2020-03-15 (×2): 875 mg
  Filled 2020-03-15 (×3): qty 7.3

## 2020-03-15 MED ORDER — FUROSEMIDE 10 MG/ML IJ SOLN
40.0000 mg | Freq: Two times a day (BID) | INTRAMUSCULAR | Status: DC
  Administered 2020-03-15 – 2020-03-22 (×16): 40 mg via INTRAVENOUS
  Filled 2020-03-15 (×16): qty 4

## 2020-03-15 MED ORDER — PANTOPRAZOLE SODIUM 40 MG PO PACK
40.0000 mg | PACK | Freq: Two times a day (BID) | ORAL | Status: DC
  Administered 2020-03-15 – 2020-04-20 (×71): 40 mg
  Filled 2020-03-15 (×73): qty 20

## 2020-03-15 NOTE — Progress Notes (Signed)
Occupational Therapy Treatment Patient Details Name: Brett Dawson MRN: [id number] DOB: 1955/08/25 Today's Date: 03/15/2020    History of present illness 64 yo male with Esophageal cancer. s/p 02/24/20 robotic assisted Ivor-Lewis esophagectomy with jejunostomy tube placement. Pt with increasing shortness of breath and fever with chest tubes placed x 2 (11/1 and 11/2)   OT comments  Pt. Refused to perform ADLs today. Pt. Was agreeable to sitting EOB. Pt. o2 sats dropped to the low to mid 80s in sitting on 5 l. Pt. Ws able to sit eob for 4;5 min and 02 sats increased to high 80s. Pt. o2 sats rebounded to low 90s in supine.   Follow Up Recommendations  Home health OT;Supervision/Assistance - 24 hour    Equipment Recommendations  3 in 1 bedside commode    Recommendations for Other Services      Precautions / Restrictions Precautions Precautions: Fall;Other (comment) Precaution Comments: drains Restrictions Weight Bearing Restrictions: No       Mobility Bed Mobility   Bed Mobility: Supine to Sit     Supine to sit: Supervision Sit to supine: Supervision   General bed mobility comments: used bed rail  Transfers       Sit to Stand: Min guard Stand pivot transfers: Min guard            Balance     Sitting balance-Leahy Scale: Good       Standing balance-Leahy Scale: Fair                             ADL either performed or assessed with clinical judgement   ADL       Grooming: Wash/dry hands;Wash/dry face;Min guard;Sitting                   Toilet Transfer: Min guard;BSC;Stand-pivot   Toileting- Architect and Hygiene: Min guard;Sit to/from stand         General ADL Comments: desats to low 80s sitting EOB.      Vision       Perception     Praxis      Cognition Arousal/Alertness: Awake/alert Behavior During Therapy: WFL for tasks assessed/performed Overall Cognitive Status: Within Functional Limits for tasks  assessed                                          Exercises     Shoulder Instructions       General Comments 5 l and desats with minima.l activity in sitting.     Pertinent Vitals/ Pain       Pain Assessment: 0-10 Pain Score: 4  Pain Location: back pain Pain Descriptors / Indicators: Aching Pain Intervention(s): Premedicated before session  Home Living                                          Prior Functioning/Environment              Frequency  Min 2X/week        Progress Toward Goals  OT Goals(current goals can now be found in the care plan section)  Progress towards OT goals: Progressing toward goals  Acute Rehab OT Goals Patient Stated Goal: to be I again OT Goal Formulation: With  patient Time For Goal Achievement: 03/27/20 Potential to Achieve Goals: Good ADL Goals Pt Will Transfer to Toilet: Independently;ambulating;regular height toilet Pt Will Perform Tub/Shower Transfer: Tub transfer;ambulating;Independently Additional ADL Goal #1: Pt will be independent in basic self care.  Plan Discharge plan remains appropriate    Co-evaluation                 AM-PAC OT "6 Clicks" Daily Activity     Outcome Measure   Help from another person eating meals?: None Help from another person taking care of personal grooming?: A Little Help from another person toileting, which includes using toliet, bedpan, or urinal?: A Little Help from another person bathing (including washing, rinsing, drying)?: A Little Help from another person to put on and taking off regular upper body clothing?: A Little Help from another person to put on and taking off regular lower body clothing?: A Little 6 Click Score: 19    End of Session Equipment Utilized During Treatment: Oxygen  OT Visit Diagnosis: Muscle weakness (generalized) (M62.81)   Activity Tolerance Patient tolerated treatment well   Patient Left with bed alarm set;in  bed;with call bell/phone within reach   Nurse Communication          Time: 6010-9323 OT Time Calculation (min): 24 min  Charges: OT General Charges $OT Visit: 1 Visit OT Treatments $Self Care/Home Management : 8-22 mins $Therapeutic Activity: 8-22 mins  Derrek Gu OT/L    Darol Cush 03/15/2020, 11:43 AM

## 2020-03-15 NOTE — Patient Outreach (Signed)
[facility]) Care Management  03/15/2020  ECHO ALLSBROOK 05-22-1955 [id number]   [place] coordination- hospitialized  Mr Brett Dawson remains hospitalized LOS 20  Pt with scheduled admission for robotic assisted Ivor-Lewis esophagectomy with jejunostomy tube placement by Dr Kipp Brood - 02/24/20 Respiratory issues, tolerating 5 L Oq , CXR with left pleural effusions vs atelectasis, chest tubes, clear liquid diet, nocturnal tube feeds (J tube), no recent fevers, continue Zosyn  Plan [facility] RN CM willfollow up withpatient after updated by [facility] hospital liaison of discharge  [place]. Lavina Hamman, RN, BSN, [place] Coordinator Office number [phone] Mobile number [phone]  Main [facility] number [phone] Fax number [phone]

## 2020-03-15 NOTE — Progress Notes (Addendum)
      [address]       [place] [address]             [phone]      20 Days Post-Op Procedure(s) (LRB): XI ROBOTIC ASSISTED IVOR LEWIS ESOPHAGECTOMY (N/A) LAPARSCOPIC  JEJUNOSTOMY TUBE PLACEMENT (N/A) ESOPHAGOGASTRODUODENOSCOPY (EGD) (N/A) INTERCOSTAL NERVE BLOCK (Right) Subjective: Says he does not feel short of breath but has the sensation of persistent obstruction in his nasal passages.   TF infusing, having appropriate bowel function.   Objective: Vital signs in last 24 hours: Temp:  [98.5 F (36.9 C)-99.1 F (37.3 C)] 98.6 F (37 C) (11/10 0322) Pulse Rate:  [81-100] 82 (11/10 0322) Cardiac Rhythm: Heart block (11/10 0720) Resp:  [18-20] 18 (11/10 0322) BP: (107-118)/(63-74) 113/67 (11/10 0322) SpO2:  [90 %-94 %] 93 % (11/10 0322)     Intake/Output from previous day: 11/09 0701 - 11/10 0700 In: 1976.3 [NG/GT:1923.8; IV Piggyback:52.6] Out: 1480 [Chest Tube:1480] Intake/Output this shift: No intake/output data recorded.  General appearance: alert, cooperative and no distress Heart: regular rate and rhythm Lungs: clear to auscultation bilaterally. CT # 2 continues to drain ~15102ml daily. No drainage from CT #1 (anterior tube). Fluid is straw-colored and serous.  Abdomen: soft, bowel sounds normal; tenderness a J-tube exit site Extremities: + edema lower ext Wound: port incisions clean and dry. Dressing at the J-Tube waws not removed today.    Lab Results: Recent Labs    03/13/20 0045  WBC 7.3  HGB 9.4*  HCT 29.3*  PLT 314   BMET:  Recent Labs    03/13/20 0045  NA 133*  K 3.9  CL 100  CO2 23  GLUCOSE 146*  BUN 8  CREATININE 0.53*  CALCIUM 7.3*    PT/INR: No results for input(s): LABPROT, INR in the last 72 hours. ABG    Component Value Date/Time   PHART 7.454 (H) 02/25/2020 0500   HCO3 26.5 02/25/2020 0500   TCO2 27 02/24/2020 1944   ACIDBASEDEF 1.0 02/24/2020 1547   O2SAT 93.7 02/25/2020 0500   CBG (last 3)   Recent Labs    03/14/20 1938 03/14/20 2344 03/15/20 0321  GLUCAP 138* 134* 120*    Assessment/Plan: S/P Procedure(s) (LRB): XI ROBOTIC ASSISTED IVOR LEWIS ESOPHAGECTOMY (N/A) LAPARSCOPIC  JEJUNOSTOMY TUBE PLACEMENT (N/A) ESOPHAGOGASTRODUODENOSCOPY (EGD) (N/A) INTERCOSTAL NERVE BLOCK (Right)  1. CV- NSR in the 80s, BP stable 2. Pulm- tolerating 5L . CXR with left pleural effusions. Keep the posterior chest tube and consider removing the anterior tube today. Will send sample of pleural fluid today to check triglyceride level.  3. Renal-no new labs today, will repeat in AM.  4. Endo-blood glucose well controlled, stopped glucometers.  5. GI-tolerating nocturnal tube feeds. Continue clear liquid diet 6. ID-WBC within normal limits on 11/8. Repeat lab in AM. No recent fevers. On day 11 Zosyn.  7. Lovenox for DVT prophylaxis 8. Jtube site with some yellow/green drainage and tenderness.  Continue local wound care, ABX.      LOS: 20 days    [name], PA-C 03/15/2020  Agree with above Will diuresis Continue CT drainage for now.  Cycling tube feeds.  Brett Dawson

## 2020-03-15 NOTE — Progress Notes (Signed)
PT Cancellation Note  Patient Details Name: ENRRIQUE MIERZWA MRN: [id number] DOB: 07/17/1955   Cancelled Treatment:    Reason Eval/Treat Not Completed: Other (comment) (Pt refused due to being cold.  will return as able. )   Denice Paradise 03/15/2020, 12:37 PM  Alexiz Sustaita W,PT Acute Rehabilitation Services Pager:  [phone]  Office:  [phone]

## 2020-03-16 ENCOUNTER — Inpatient Hospital Stay (HOSPITAL_COMMUNITY): Payer: 59

## 2020-03-16 LAB — BASIC METABOLIC PANEL
Anion gap: 8 (ref 5–15)
BUN: 12 mg/dL (ref 8–23)
CO2: 26 mmol/L (ref 22–32)
Calcium: 7.7 mg/dL — ABNORMAL LOW (ref 8.9–10.3)
Chloride: 100 mmol/L (ref 98–111)
Creatinine, Ser: 0.54 mg/dL — ABNORMAL LOW (ref 0.61–1.24)
GFR, Estimated: >60 mL/min (ref >60)
Glucose, Bld: 153 mg/dL — ABNORMAL HIGH (ref 70–99)
Potassium: 4 mmol/L (ref 3.5–5.1)
Sodium: 134 mmol/L — ABNORMAL LOW (ref 135–145)

## 2020-03-16 LAB — CBC
HCT: 28.9 % — ABNORMAL LOW (ref 39.0–52.0)
Hemoglobin: 9.6 g/dL — ABNORMAL LOW (ref 13.0–17.0)
MCH: 30.3 pg (ref 26.0–34.0)
MCHC: 33.2 g/dL (ref 30.0–36.0)
MCV: 91.2 fL (ref 80.0–100.0)
Platelets: 328 10*3/uL (ref 150–400)
RBC: 3.17 MIL/uL — ABNORMAL LOW (ref 4.22–5.81)
RDW: 18.1 % — ABNORMAL HIGH (ref 11.5–15.5)
WBC: 9.6 10*3/uL (ref 4.0–10.5)
nRBC: 0 % (ref 0.0–0.2)

## 2020-03-16 LAB — TRIGLYCERIDES, BODY FLUIDS: Triglycerides, Fluid: 341 mg/dL

## 2020-03-16 LAB — GLUCOSE, CAPILLARY: Glucose-Capillary: 97 mg/dL (ref 70–99)

## 2020-03-16 MED ORDER — FLUTICASONE PROPIONATE 50 MCG/ACT NA SUSP
2.0000 | Freq: Every day | NASAL | Status: DC
  Administered 2020-03-16 – 2020-04-20 (×19): 2 via NASAL
  Filled 2020-03-16: qty 16

## 2020-03-16 MED ORDER — GUAIFENESIN 100 MG/5ML PO SOLN
15.0000 mL | ORAL | Status: DC
  Administered 2020-03-16 – 2020-03-22 (×31): 300 mg
  Filled 2020-03-16 (×4): qty 15
  Filled 2020-03-16: qty 5
  Filled 2020-03-16 (×19): qty 15
  Filled 2020-03-16: qty 5
  Filled 2020-03-16 (×2): qty 15
  Filled 2020-03-16: qty 10
  Filled 2020-03-16 (×4): qty 15
  Filled 2020-03-16: qty 10

## 2020-03-16 MED ORDER — AMOXICILLIN-POT CLAVULANATE 400-57 MG/5ML PO SUSR
875.0000 mg | Freq: Two times a day (BID) | ORAL | Status: AC
  Administered 2020-03-16 – 2020-04-05 (×41): 875 mg
  Filled 2020-03-16 (×44): qty 10.9

## 2020-03-16 NOTE — Progress Notes (Signed)
Physical Therapy Treatment Patient Details Name: Brett Dawson MRN: [id number] DOB: 12-Sep-1955 Today's Date: 03/16/2020    History of Present Illness 64 yo male with Esophageal cancer. s/p 02/24/20 robotic assisted Ivor-Lewis esophagectomy with jejunostomy tube placement. Pt with increasing shortness of breath and fever with chest tubes placed x 2 (11/1 and 11/2)    PT Comments    Pt agreeable to working with therapy, but is obviously frustrated by the fact that he is having so much trouble breathing. Pt wants to sit up the chair for a while. Pt is supervision for bed mobility and min guard for transfers. Pt not able to maintain SaO2 > 85%O2 on 8L HFNC in sitting, once pt returned to bed SaO2 rebounded to 88-89%O2 on 8L HFNC. Notified RN of results. Pt goals addressed and downgraded today given pt's limited mobility tolerance and associated decreasing strength and endurance. PT will continue to follow acutely.    Follow Up Recommendations  Home health PT;Supervision/Assistance - 24 hour     Equipment Recommendations  None recommended by PT       Precautions / Restrictions Precautions Precautions: Fall;Other (comment) Precaution Comments: drains    Mobility  Bed Mobility Overal bed mobility: Needs Assistance Bed Mobility: Supine to Sit     Supine to sit: Supervision Sit to supine: Supervision   General bed mobility comments: used bed rail  Transfers Overall transfer level: Needs assistance Equipment used: Rolling walker (2 wheeled) Transfers: Sit to/from Stand Sit to Stand: Min guard Stand pivot transfers: Min guard       General transfer comment: min guard for safety, increased use bed rail to steady, 1 minor LoB with pivot to chair causing pt to sit quickly  Ambulation/Gait             General Gait Details: unable due to increased O2 demand         Balance Overall balance assessment: Needs assistance Sitting-balance support: Feet supported;No upper  extremity supported Sitting balance-Leahy Scale: Good     Standing balance support: No upper extremity supported;During functional activity Standing balance-Leahy Scale: Fair Standing balance comment: statically                            Cognition Arousal/Alertness: Awake/alert Behavior During Therapy: WFL for tasks assessed/performed Overall Cognitive Status: Within Functional Limits for tasks assessed                                           General Comments General comments (skin integrity, edema, etc.): Pt on 5L O2 via HFNC on entry with SaO2 88%O2 with cues for purse lip breathing able to achieve 90%O2, with transfer to chair SaO2 drops to 82%O2, despite maximal purse lip breathing and increase to 8L O2 via HFNC pt not able to attain >86% O2, transferred back to bed and in supine on 8L O2 SaO2 rebounded to 87-88%O2, RN notified      Pertinent Vitals/Pain Pain Assessment: 0-10 Faces Pain Scale: Hurts little more Pain Location: back pain Pain Descriptors / Indicators: Aching Pain Intervention(s): Limited activity within patient's tolerance;Monitored during session;Repositioned           PT Goals (current goals can now be found in the care plan section) Acute Rehab PT Goals Patient Stated Goal: to be I again PT Goal Formulation: With patient Time For Goal  Achievement: 03/30/20 Potential to Achieve Goals: Fair Progress towards PT goals: Goals downgraded-see care plan    Frequency    Min 3X/week      PT Plan Current plan remains appropriate       AM-PAC PT "6 Clicks" Mobility   Outcome Measure  Help needed turning from your back to your side while in a flat bed without using bedrails?: None Help needed moving from lying on your back to sitting on the side of a flat bed without using bedrails?: A Little Help needed moving to and from a bed to a chair (including a wheelchair)?: A Little Help needed standing up from a chair using your  arms (e.g., wheelchair or bedside chair)?: A Little Help needed to walk in hospital room?: A Little Help needed climbing 3-5 steps with a railing? : Total 6 Click Score: 17    End of Session   Activity Tolerance: Treatment limited secondary to medical complications (Comment) (oxygen desaturation ) Patient left: with call bell/phone within reach;in chair Nurse Communication: Mobility status;Other (comment) (oxygen desaturation ) PT Visit Diagnosis: Other abnormalities of gait and mobility (R26.89)     Time: 2952-8413 PT Time Calculation (min) (ACUTE ONLY): 28 min  Charges:  $Therapeutic Activity: 23-37 mins                     Brett Dawson B. [name] PT, DPT Acute Rehabilitation Services Pager [phone] Office [phone]    Brett Dawson 03/16/2020, 1:34 PM

## 2020-03-16 NOTE — Progress Notes (Addendum)
[address]       [place] [address]             [phone]      21 Days Post-Op Procedure(s) (LRB): XI ROBOTIC ASSISTED IVOR LEWIS ESOPHAGECTOMY (N/A) LAPARSCOPIC  JEJUNOSTOMY TUBE PLACEMENT (N/A) ESOPHAGOGASTRODUODENOSCOPY (EGD) (N/A) INTERCOSTAL NERVE BLOCK (Right) Subjective: Not walking much, says he is getting more "winded" with activity. Feels like he has airway congestion he can't clear.   TF infusing, having appropriate bowel function.   Urine output not measured.   Objective: Vital signs in last 24 hours: Temp:  [97.8 F (36.6 C)-100.7 F (38.2 C)] 98.6 F (37 C) (11/11 0335) Pulse Rate:  [78-104] 85 (11/11 0335) Cardiac Rhythm: Normal sinus rhythm (11/11 0400) Resp:  [18-22] 19 (11/11 0335) BP: (101-115)/(62-86) 111/65 (11/11 0335) SpO2:  [89 %-95 %] 92 % (11/11 0335)     Intake/Output from previous day: 11/10 0701 - 11/11 0700 In: 1615.1 [NG/GT:1467.8; IV Piggyback:147.4] Out: 900 [Chest Tube:900] Intake/Output this shift: No intake/output data recorded.  General appearance: alert, cooperative and mild distress Heart: regular rate and rhythm Lungs: clear to auscultation bilaterally. Anterior chest tube removed yesterday. Remaining tube drained ~98ml past 24 hours. Fluid is straw-colored and serous.  Abdomen: soft, bowel sounds normal; tenderness a J-tube exit site unchanged. Extremities: + edema lower ext Wound: port incisions clean and dry.    Lab Results: Recent Labs    03/16/20 0114  WBC 9.6  HGB 9.6*  HCT 28.9*  PLT 328   BMET:  Recent Labs    03/16/20 0114  NA 134*  K 4.0  CL 100  CO2 26  GLUCOSE 153*  BUN 12  CREATININE 0.54*  CALCIUM 7.7*    PT/INR: No results for input(s): LABPROT, INR in the last 72 hours. ABG    Component Value Date/Time   PHART 7.454 (H) 02/25/2020 0500   HCO3 26.5 02/25/2020 0500   TCO2 27 02/24/2020 1944   ACIDBASEDEF 1.0 02/24/2020 1547   O2SAT 93.7 02/25/2020 0500    CBG (last 3)  Recent Labs    03/14/20 2344 03/15/20 0321 03/15/20 0810  GLUCAP 134* 120* 162*    Assessment/Plan: S/P Procedure(s) (LRB): XI ROBOTIC ASSISTED IVOR LEWIS ESOPHAGECTOMY (N/A) LAPARSCOPIC  JEJUNOSTOMY TUBE PLACEMENT (N/A) ESOPHAGOGASTRODUODENOSCOPY (EGD) (N/A) INTERCOSTAL NERVE BLOCK (Right)  1. CV- NSR in the 80s, BP stable 2. Pulm- tolerating 5L [place]. CXR with patchy densities. Adding flutter valve, guaifenesin. Encouraging more activity and pulmonary hygiene.  Keep the posterior chest tube for drainage.  Sent sample of pleural fluid 11/10 to check triglyceride level, result pending.  3. Renal-UO not measured, Creat 0.54. 4. Endo-blood glucose well controlled, stopped glucometers.  5. GI-tolerating nocturnal tube feeds. Continue clear liquid diet 6. ID-WBC within normal limits.  No recent fevers. Had Zosyn x 11 days, switched to Augmentin yesterday.   7. Lovenox for DVT prophylaxis 8. Jtube site with some yellow/green drainage and tenderness.  Continue local wound care, ABX.      LOS: 21 days    [name], PA-C 03/16/2020  Chest tube drainage has triglyceride content of 341- poss lymph leak Have discussed with Dr Jacqulynn Cadet to review and consider lymphangiogram and ollusion of leak if found.  explained to patient   Hold on changing tube feeding to medium chain triglycerides until decide about proceeding with lymphangiogram   I have seen and examined [name] and agree with the above assessment  and plan.  Grace Isaac MD Beeper [phone] Office [phone] 03/17/2020 9:17 AM

## 2020-03-16 NOTE — Progress Notes (Signed)
Changed dressing around J-Tube with Nystatin and Silvadene. [place]

## 2020-03-17 MED ORDER — NON FORMULARY: 1000.0000 mL | Status: DC

## 2020-03-17 MED ORDER — PROSOURCE TF PO LIQD
45.0000 mL | Freq: Three times a day (TID) | ORAL | Status: DC
  Administered 2020-03-17 – 2020-03-20 (×10): 45 mL
  Filled 2020-03-17 (×10): qty 45

## 2020-03-17 MED ORDER — VIVONEX RTF PO LIQD
1000.0000 mL | ORAL | Status: DC
  Filled 2020-03-17 (×2): qty 1000

## 2020-03-17 MED ORDER — OSMOLITE 1.5 CAL PO LIQD
1000.0000 mL | ORAL | Status: DC
  Administered 2020-03-17 – 2020-03-19 (×3): 1000 mL
  Filled 2020-03-17 (×4): qty 1000

## 2020-03-17 NOTE — Progress Notes (Addendum)
Nutrition Follow-up  DOCUMENTATION CODES:   Non-severe (moderate) malnutrition in context of chronic illness  INTERVENTION:   Transition back to continuous tube feeds via J-tube: - Osmolite 1.5 @ 70 ml/hr (1680 ml/day) - ProSource TF69m TID  Tube feeding regimen provides2640kcal, 138grams of protein, and1280 ml of H2O (meets 100% of estimated kcal and protein needs).   If low-fat, MCT formula required after IR procedure, recommend: - Vivonex RTF @ 95 ml/hr (2280 ml/day) - ProSource TF 45 ml TID  Tube feeding regimen provides 2400 kcal, 147 grams of protein, and 1933 ml of H2O (meets 100% of estimated kcal and protein needs).  Vivonex RTF formula is available in-house at this time. RD has confirmed this today with Pharmacy.  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  Ongoing  GOAL:   Patient will meet greater than or equal to 90% of their needs  Met via TF  MONITOR:   Diet advancement, Labs, Weight trends, Skin, I & O's  REASON FOR ASSESSMENT:   Consult Enteral/tube feeding initiation and management  ASSESSMENT:   64year old male who presented on 10/21 for Ivor Lewis esophagectomy and J-tube placement. 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  10/22 - TF initiated 10/26 - swallow study, no evidence of leak, NGT removed 10/27 - MBS, diet advanced to clears, transitioned to nocturnal TF 10/31 - s/p thoracentesis with 1 L fluid removed 11/01 - chest tube placed, NPO 11/03 - left chest tube placed by IR 11/04 - clear liquids 11/05 - JP drain removed  Chest tube drainage with triglyceride content of 341 which per CTS is indicative  of possible lymph leak. Plan is for IR to review case and consider lymphangiogram and possible coiling.  RD consulted to change tube feeding formula to medium chain triglyceride formula. Discussed case with Dr. GServando Snarewho would prefer pt to stay on Osmolite 1.5 formula prior to lymphangiogram. Per Dr. GServando Snare can consider MCT formula after IR procedure. Discussed with RN. Will reorder Osmolite 1.5 to run continuously.  Weight down to 86.5 kg today compared to 93.5 kg on 11/08. Question accuracy of today's weight given pt's weight had been hovering around 95 kg over the last 2 weeks. However, pt with high chest tube output and increased nutrient needs so amount of weight loss may be accurate.  Admit weight: 99.6 kg Current weight:86.5 kg  Medications reviewed and include: IV lasix 40 mg BID, protonix  Labs reviewed: sodium 134, hemoglobin 9.6 CBGs: 97  UOP: 625 ml x 24 hours CT: 1700 ml x 24 hours I/O's: +14.9 L since admit  Diet Order:   Diet Order            Diet clear liquid Room service appropriate? Yes; Fluid consistency: Thin  Diet effective now                 EDUCATION NEEDS:   Education needs have been addressed  Skin:  Skin Assessment: Skin Integrity Issues: Incisions: chest, adomen  Last BM:  03/16/20 type 7  Height:   Ht Readings from Last 1 Encounters:  02/24/20 _0  (1.803 m)    Weight:   Wt Readings from Last 1 Encounters:  03/17/20 86.5 kg    Ideal  Body Weight:  78.2 kg  BMI:  Body mass index is 26.6 kg/m.  Estimated Nutritional Needs:   Kcal:  5726-2035  Protein:  130-150 grams  Fluid:  >/= 2.2 L    Gaynell Face, MS, RD, LDN Inpatient Clinical Dietitian Please see AMiON for contact information.

## 2020-03-18 NOTE — Progress Notes (Signed)
Encouraged ambulation but refused, explained the significance of getting out of bed. To try again later.

## 2020-03-18 NOTE — Progress Notes (Signed)
23 Days Post-Op Procedure(s) (LRB): XI ROBOTIC ASSISTED IVOR LEWIS ESOPHAGECTOMY (N/A) LAPARSCOPIC  JEJUNOSTOMY TUBE PLACEMENT (N/A) ESOPHAGOGASTRODUODENOSCOPY (EGD) (N/A) INTERCOSTAL NERVE BLOCK (Right) Subjective: Some SOB over night, feels better this AM  Objective: Vital signs in last 24 hours: Temp:  [98.4 F (36.9 C)-99.3 F (37.4 C)] 99.3 F (37.4 C) (11/13 0329) Pulse Rate:  [86-107] 107 (11/13 0329) Cardiac Rhythm: Normal sinus rhythm (11/12 2000) Resp:  [18-21] 21 (11/13 0329) BP: (100-105)/(64-66) 101/64 (11/13 0329) SpO2:  [88 %-99 %] 92 % (11/13 0329)  Hemodynamic parameters for last 24 hours:    Intake/Output from previous day: 11/12 0701 - 11/13 0700 In: 4588.8 [P.O.:440; NG/GT:4148.8] Out: 2850 [Urine:1000; Chest Tube:1850] Intake/Output this shift: No intake/output data recorded.  General appearance: alert, cooperative and no distress Neurologic: intact Heart: regular rate and rhythm Lungs: diminished breath sounds bibasilar Abdomen: normal findings: soft, non-tender drainage from tube unchanged  Lab Results: Recent Labs    03/16/20 0114  WBC 9.6  HGB 9.6*  HCT 28.9*  PLT 328   BMET:  Recent Labs    03/16/20 0114  NA 134*  K 4.0  CL 100  CO2 26  GLUCOSE 153*  BUN 12  CREATININE 0.54*  CALCIUM 7.7*    PT/INR: No results for input(s): LABPROT, INR in the last 72 hours. ABG    Component Value Date/Time   PHART 7.454 (H) 02/25/2020 0500   HCO3 26.5 02/25/2020 0500   TCO2 27 02/24/2020 1944   ACIDBASEDEF 1.0 02/24/2020 1547   O2SAT 93.7 02/25/2020 0500   CBG (last 3)  Recent Labs    03/16/20 1131  GLUCAP 97    Assessment/Plan: S/P Procedure(s) (LRB): XI ROBOTIC ASSISTED IVOR LEWIS ESOPHAGECTOMY (N/A) LAPARSCOPIC  JEJUNOSTOMY TUBE PLACEMENT (N/A) ESOPHAGOGASTRODUODENOSCOPY (EGD) (N/A) INTERCOSTAL NERVE BLOCK (Right) -POD # 23 1800 ml of drainage from tube yesterday and appears milky For lymphangiogram/ embolization of  thoracic duct- hopefully today Repeat labs in AM Ambulate Continue tube feedings   LOS: 23 days    Brett Dawson 03/18/2020

## 2020-03-18 NOTE — Progress Notes (Signed)
Chest tube canister [place] is full - placed a new one. Continue to monitor.

## 2020-03-18 NOTE — Progress Notes (Signed)
Patient was placed on High Flow oxygen 10LNC he was on 7LNC sats 87%. Now able to maintain sats in the 90's. I will continue to monitor.

## 2020-03-19 ENCOUNTER — Inpatient Hospital Stay (HOSPITAL_COMMUNITY): Payer: 59

## 2020-03-19 LAB — COMPREHENSIVE METABOLIC PANEL
ALT: 152 U/L — ABNORMAL HIGH (ref 0–44)
AST: 96 U/L — ABNORMAL HIGH (ref 15–41)
Albumin: 1.8 g/dL — ABNORMAL LOW (ref 3.5–5.0)
Alkaline Phosphatase: 156 U/L — ABNORMAL HIGH (ref 38–126)
Anion gap: 10 (ref 5–15)
BUN: 21 mg/dL (ref 8–23)
CO2: 29 mmol/L (ref 22–32)
Calcium: 8.3 mg/dL — ABNORMAL LOW (ref 8.9–10.3)
Chloride: 92 mmol/L — ABNORMAL LOW (ref 98–111)
Creatinine, Ser: 0.59 mg/dL — ABNORMAL LOW (ref 0.61–1.24)
GFR, Estimated: >60 mL/min (ref >60)
Glucose, Bld: 155 mg/dL — ABNORMAL HIGH (ref 70–99)
Potassium: 3.8 mmol/L (ref 3.5–5.1)
Sodium: 131 mmol/L — ABNORMAL LOW (ref 135–145)
Total Bilirubin: 0.6 mg/dL (ref 0.3–1.2)
Total Protein: 7.1 g/dL (ref 6.5–8.1)

## 2020-03-19 LAB — CBC
HCT: 29.5 % — ABNORMAL LOW (ref 39.0–52.0)
Hemoglobin: 9.9 g/dL — ABNORMAL LOW (ref 13.0–17.0)
MCH: 30.2 pg (ref 26.0–34.0)
MCHC: 33.6 g/dL (ref 30.0–36.0)
MCV: 89.9 fL (ref 80.0–100.0)
Platelets: 372 10*3/uL (ref 150–400)
RBC: 3.28 MIL/uL — ABNORMAL LOW (ref 4.22–5.81)
RDW: 16.9 % — ABNORMAL HIGH (ref 11.5–15.5)
WBC: 6.8 10*3/uL (ref 4.0–10.5)
nRBC: 0 % (ref 0.0–0.2)

## 2020-03-19 LAB — GLUCOSE, CAPILLARY: Glucose-Capillary: 169 mg/dL — ABNORMAL HIGH (ref 70–99)

## 2020-03-19 NOTE — Progress Notes (Signed)
[place]  canister is full. Replaced with a new one. Tolerated well.

## 2020-03-19 NOTE — Progress Notes (Signed)
Encouraged ambulation, claimed not feeling good. To try later today

## 2020-03-19 NOTE — Progress Notes (Signed)
24 Days Post-Op Procedure(s) (LRB): XI ROBOTIC ASSISTED IVOR LEWIS ESOPHAGECTOMY (N/A) LAPARSCOPIC  JEJUNOSTOMY TUBE PLACEMENT (N/A) ESOPHAGOGASTRODUODENOSCOPY (EGD) (N/A) INTERCOSTAL NERVE BLOCK (Right) Subjective: Refused to walk earlier Denies pain  Objective: Vital signs in last 24 hours: Temp:  [98.4 F (36.9 C)-101.2 F (38.4 C)] 99.1 F (37.3 C) (11/14 0837) Pulse Rate:  [94-110] 103 (11/14 0837) Cardiac Rhythm: Normal sinus rhythm (11/14 0701) Resp:  [16-20] 20 (11/14 0837) BP: (98-111)/(64-70) 102/69 (11/14 0837) SpO2:  [94 %-97 %] 94 % (11/14 0837) Weight:  [84.6 kg] 84.6 kg (11/14 0339)  Hemodynamic parameters for last 24 hours:    Intake/Output from previous day: 11/13 0701 - 11/14 0700 In: 2015 [P.O.:415; NG/GT:1540] Out: 3590 [Urine:1550; Chest Tube:2040] Intake/Output this shift: Total I/O In: 70 [NG/GT:70] Out: -   General appearance: alert, cooperative and no distress Neurologic: intact Heart: tachy,regular Lungs: diminished breath sounds bibasilar milky drainage form CT  Lab Results: Recent Labs    03/19/20 0043  WBC 6.8  HGB 9.9*  HCT 29.5*  PLT 372   BMET:  Recent Labs    03/19/20 0043  NA 131*  K 3.8  CL 92*  CO2 29  GLUCOSE 155*  BUN 21  CREATININE 0.59*  CALCIUM 8.3*    PT/INR: No results for input(s): LABPROT, INR in the last 72 hours. 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  Servando Snare talked with IR about doing lymphangiogram, possible embolization on Friday, but no word on that yet. Will talk with IR again in AM Will continue Osmolite for now pending possible IR procedure tomorrow, but if not able to do it will change to no fat/ MCT TF On Augmentin- afebrile and WBC normal Encouraged to walk   LOS: 24 days    Melrose Nakayama 03/19/2020

## 2020-03-20 ENCOUNTER — Other Ambulatory Visit (HOSPITAL_COMMUNITY): Payer: Self-pay

## 2020-03-20 LAB — CBC
HCT: 30.5 % — ABNORMAL LOW (ref 39.0–52.0)
Hemoglobin: 10.2 g/dL — ABNORMAL LOW (ref 13.0–17.0)
MCH: 30.8 pg (ref 26.0–34.0)
MCHC: 33.4 g/dL (ref 30.0–36.0)
MCV: 92.1 fL (ref 80.0–100.0)
Platelets: 463 10*3/uL — ABNORMAL HIGH (ref 150–400)
RBC: 3.31 MIL/uL — ABNORMAL LOW (ref 4.22–5.81)
RDW: 16.8 % — ABNORMAL HIGH (ref 11.5–15.5)
WBC: 5.6 10*3/uL (ref 4.0–10.5)
nRBC: 0 % (ref 0.0–0.2)

## 2020-03-20 LAB — BASIC METABOLIC PANEL
Anion gap: 9 (ref 5–15)
BUN: 22 mg/dL (ref 8–23)
CO2: 30 mmol/L (ref 22–32)
Calcium: 8.5 mg/dL — ABNORMAL LOW (ref 8.9–10.3)
Chloride: 89 mmol/L — ABNORMAL LOW (ref 98–111)
Creatinine, Ser: 0.61 mg/dL (ref 0.61–1.24)
GFR, Estimated: >60 mL/min (ref >60)
Glucose, Bld: 149 mg/dL — ABNORMAL HIGH (ref 70–99)
Potassium: 4.2 mmol/L (ref 3.5–5.1)
Sodium: 128 mmol/L — ABNORMAL LOW (ref 135–145)

## 2020-03-20 MED ORDER — NON FORMULARY: 1000.0000 mL | Status: DC

## 2020-03-20 MED ORDER — VIVONEX RTF PO LIQD
1000.0000 mL | ORAL | Status: DC
  Administered 2020-03-20 – 2020-03-24 (×8): 1000 mL via ORAL
  Filled 2020-03-20 (×13): qty 1000

## 2020-03-20 MED ORDER — VIVONEX RTF PO LIQD
1000.0000 mL | ORAL | Status: DC
  Administered 2020-03-20: 1000 mL via ORAL
  Filled 2020-03-20 (×2): qty 1000

## 2020-03-20 MED ORDER — PROSOURCE TF PO LIQD
45.0000 mL | Freq: Two times a day (BID) | ORAL | Status: DC
  Administered 2020-03-20 – 2020-03-24 (×7): 45 mL
  Filled 2020-03-20 (×7): qty 45

## 2020-03-20 MED ORDER — VITAL 1.5 CAL PO LIQD
1000.0000 mL | ORAL | Status: DC
  Filled 2020-03-20: qty 1000

## 2020-03-20 NOTE — Progress Notes (Addendum)
[address]       [place] [address]             [phone]      25 Days Post-Op Procedure(s) (LRB): XI ROBOTIC ASSISTED IVOR LEWIS ESOPHAGECTOMY (N/A) LAPARSCOPIC  JEJUNOSTOMY TUBE PLACEMENT (N/A) ESOPHAGOGASTRODUODENOSCOPY (EGD) (N/A) INTERCOSTAL NERVE BLOCK (Right) Subjective: No new events over the weekend but still having trouble with nasal congestion.   TF infusing, having appropriate bowel function.   Continues to have high-volume left pleural drainage.    Objective: Vital signs in last 24 hours: Temp:  [97.5 F (36.4 C)-100.8 F (38.2 C)] 98.9 F (37.2 C) (11/15 0443) Pulse Rate:  [91-109] 100 (11/15 0443) Cardiac Rhythm: Normal sinus rhythm (11/15 0700) Resp:  [17-20] 17 (11/15 0443) BP: (101-114)/(62-72) 108/72 (11/15 0443) SpO2:  [92 %-95 %] 94 % (11/15 0443) Weight:  [85 kg] 85 kg (11/15 0500)     Intake/Output from previous day: 11/14 0701 - 11/15 0700 In: 2240 [P.O.:570; NG/GT:1610] Out: 3942 [Urine:1550; Chest Tube:2392] Intake/Output this shift: No intake/output data recorded.  General appearance: alert, cooperative and mild distress Heart: regular rate and rhythm Lungs: clear to auscultation bilaterally. Remaining left pleural tube drained ~2475ml past 24 hours. Abdomen: soft, bowel sounds normal; tenderness a J-tube exit site dressing is dry.. Extremities: no peripheral edema Wound: port incisions have healed.   Lab Results: Recent Labs    03/19/20 0043 03/20/20 0223  WBC 6.8 5.6  HGB 9.9* 10.2*  HCT 29.5* 30.5*  PLT 372 463*   BMET:  Recent Labs    03/19/20 0043 03/20/20 0223  NA 131* 128*  K 3.8 4.2  CL 92* 89*  CO2 29 30  GLUCOSE 155* 149*  BUN 21 22  CREATININE 0.59* 0.61  CALCIUM 8.3* 8.5*    PT/INR: No results for input(s): LABPROT, INR in the last 72 hours. ABG    Component Value Date/Time   PHART 7.454 (H) 02/25/2020 0500   HCO3 26.5 02/25/2020 0500   TCO2 27 02/24/2020 1944    ACIDBASEDEF 1.0 02/24/2020 1547   O2SAT 93.7 02/25/2020 0500   CBG (last 3)  Recent Labs    03/19/20 0621  GLUCAP 169*    Assessment/Plan: S/P Procedure(s) (LRB): XI ROBOTIC ASSISTED IVOR LEWIS ESOPHAGECTOMY (N/A) LAPARSCOPIC  JEJUNOSTOMY TUBE PLACEMENT (N/A) ESOPHAGOGASTRODUODENOSCOPY (EGD) (N/A) INTERCOSTAL NERVE BLOCK (Right)  1. CV- NSR 90-100, BP stable 2. Pulm- requiring 6L [place] to maintain O2 sats. CXR with patchy densities. High-volume left pleural drainage with elevated triglycerides.  Dr. Servando Snare spoke with IR on Friday regarding lymphangiogram and possible embolization of the thoracic duct. Will formally consult IR today.  Change TF to Vivonex RTF per RD recommendation.  3. Renal-UO not measured, Creat stable.  4. Endo-blood glucose well controlled, stopped glucometers.  5. GI-tolerating nocturnal tube feeds. Continue clear liquid diet 6. ID-WBC within normal limits.  No recent fevers. Had Zosyn x 11 days, switched to Augmentin.  7. Lovenox for DVT prophylaxis   LOS: 25 days    Brett Odea, PA-C 03/20/2020  Now very obvious  High volume chyle leak into left chest  Discussed lymphangiogram with radiology on Friday and placed order - continued osmolyt at that time - thinking study could be done Saturday - but was not able to be arranged. Now for Thursday - will start mct tube feeding now - switch back to on with hight fat content Wednesday night if study will be done Thursday  . Discussed with  patient  I have seen and examined Brett Dawson and agree with the above assessment  and plan.  Grace Isaac MD Beeper [phone] Office [phone] 03/20/2020 1:55 PM

## 2020-03-20 NOTE — Progress Notes (Signed)
PT Cancellation Note  Patient Details Name: Brett Dawson MRN: [id number] DOB: 04-Jul-1955   Cancelled Treatment:    Reason Eval/Treat Not Completed: Other (comment). Pt declined therapy today due to breathing better in the bed than the chair and having just done theraband exercises with OT. Pt reports putting theraband around foot to do some LE exercises. Offered to teach patient some LE bed level exercises however declined stating "I do stuff." Pt on 10L02 HFNC, SPO2 at 91% resting in bed. Pt reports he may go for a surgery on Thursday to stop the leak but they're trying to get it moved up sooner. Acute PT to continue to monitor patient and attempt to progress mobility as pt is able.  Kittie Plater, PT, DPT Acute Rehabilitation Services Pager #: [phone] Office #: [phone]   Kittie Plater, PT, DPT Acute Rehabilitation Services Pager #: [phone] Office #: [phone]    Berline Lopes 03/20/2020, 11:38 AM

## 2020-03-20 NOTE — Progress Notes (Signed)
   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 Servando Snare talked with IR about doing lymphangiogram  Procedure has been scheduled:  Lymphangiogram with thoracic duct embolization with IR and anesthesia Nov 18 830 am  Attempted to talk with pt about procedure and consent today He is in OR for wound vac change per RN  Will re attempt to see pt in am

## 2020-03-20 NOTE — Progress Notes (Signed)
Occupational Therapy Treatment Patient Details Name: Brett Dawson MRN: [id number] DOB: 09-Sep-1955 Today's Date: 03/20/2020    History of present illness 64 yo male with Esophageal cancer. s/p 02/24/20 robotic assisted Ivor-Lewis esophagectomy with jejunostomy tube placement. Pt with increasing shortness of breath and fever with chest tubes placed x 2 (11/1 and 11/2)   OT comments  Pt educated in level 3 theraband exercises for UEs. Pt goal was to shave his beard. Assisted pt using unit's electric razor. Pt on 8L HFNC with Sp02 91-95% throughout session. Pt voicing frustration with long LOS. He is also uncomfortable being SOB.   Follow Up Recommendations  Home health OT;Supervision/Assistance - 24 hour    Equipment Recommendations  3 in 1 bedside commode    Recommendations for Other Services      Precautions / Restrictions Precautions Precautions: Fall;Other (comment) Precaution Comments: chest tube, gtube Restrictions Weight Bearing Restrictions: No       Mobility Bed Mobility Overal bed mobility: Needs Assistance Bed Mobility: Supine to Sit;Sit to Supine     Supine to sit: Supervision Sit to supine: Supervision   General bed mobility comments: used bed rail, HOB slightly elevated  Transfers                      Balance Overall balance assessment: Needs assistance Sitting-balance support: Feet supported;No upper extremity supported Sitting balance-Leahy Scale: Good                                     ADL either performed or assessed with clinical judgement   ADL Overall ADL's : Needs assistance/impaired     Grooming: Wash/dry hands;Wash/dry face;Sitting;Set up (total assist to shave pt with electric razor)                                 General ADL Comments: pt on 8L HFNC     Vision       Perception     Praxis      Cognition Arousal/Alertness: Awake/alert Behavior During Therapy: WFL for tasks  assessed/performed Overall Cognitive Status: Within Functional Limits for tasks assessed                                          Exercises Other Exercises Other Exercises: issued level 3 theraband and educated in Horizontal abduction, FF and elbow flexion and extension.   Shoulder Instructions       General Comments      Pertinent Vitals/ Pain       Pain Assessment: No/denies pain  Home Living                                          Prior Functioning/Environment              Frequency  Min 2X/week        Progress Toward Goals  OT Goals(current goals can now be found in the care plan section)  Progress towards OT goals: Progressing toward goals  Acute Rehab OT Goals Patient Stated Goal: to be I again OT Goal Formulation: With patient Time For Goal Achievement: 03/27/20 Potential to  Achieve Goals: Good  Plan Discharge plan remains appropriate    Co-evaluation                 AM-PAC OT "6 Clicks" Daily Activity     Outcome Measure   Help from another person eating meals?: None Help from another person taking care of personal grooming?: A Little Help from another person toileting, which includes using toliet, bedpan, or urinal?: A Little Help from another person bathing (including washing, rinsing, drying)?: A Little Help from another person to put on and taking off regular upper body clothing?: A Little Help from another person to put on and taking off regular lower body clothing?: A Little 6 Click Score: 19    End of Session Equipment Utilized During Treatment: Oxygen  OT Visit Diagnosis: Muscle weakness (generalized) (M62.81)   Activity Tolerance Patient tolerated treatment well   Patient Left in bed;with call bell/phone within reach   Nurse Communication          Time: 0383-3383 OT Time Calculation (min): 47 min  Charges: OT General Charges $OT Visit: 1 Visit OT Treatments $Self Care/Home  Management : 38-52 mins  Nestor Lewandowsky, OTR/L Acute Rehabilitation Services Pager: [phone] Office: [phone]   Malka So 03/20/2020, 10:07 AM

## 2020-03-20 NOTE — Progress Notes (Signed)
Nutrition Follow-up  DOCUMENTATION CODES:   Severe malnutrition in context of acute illness/injury  INTERVENTION:   Tube feeding via J-tube: - Change to Vivonex RTF @ 110 ml/hr (2640 ml/day) - ProSource TF 45 ml BID  Tube feeding regimen provides 2720 kcal, 154 grams of protein, and 2239 ml of H2O.   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  New diagnosis  GOAL:   Patient will meet greater than or equal to 90% of their needs  Met via TF  MONITOR:   PO intake, Labs, Weight trends, TF tolerance, Skin, I & O's  REASON FOR ASSESSMENT:   Consult Enteral/tube feeding initiation and management  ASSESSMENT:   64 year old male who presented on 10/21 for Ivor Lewis esophagectomy and J-tube placement. 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  10/22 - TF initiated 10/26 - swallow study, no evidence of leak, NGT removed 10/27 - MBS, diet advanced to clears, transitioned to nocturnal TF 10/31 - s/p thoracentesis with 1 L fluid removed 11/01 - chest tube placed, NPO 11/03 - left chest tube placed by IR 11/04 - clear liquids 11/05 - JP drain removed  RD contacted by PA to change pt's TF to Vivonex RTF given chylothorax. Noted plan for lymphangiogram with thoracic duct embolization by IR on 03/23/20.  Spoke with pt at bedside. Pt requsting apple juice x 2 and cranberry juice x 2 with each meal tray. RD to order.  Discussed plan to change TF with RN and with Pharmacy. Orders placed.  Admit weight: 99.6 kg Current weight:85 kg  Pt with a 14.6 kg weight loss since 02/24/20. This is a 14.7% weight loss in less than 1 month which is severe and significant for timeframe.  Meal Completion: 10-30% x  last 5 documented meals on 11/13 and 11/14  Medications reviewed and include: lasix, protonix  Labs reviewed: sodium 128, elevated LFTs CBG's: 169  UOP: 1550 ml x 24 hours CT: 2392 ml x 24 hours I/O's: +13.1 L since admit  NUTRITION - FOCUSED PHYSICAL EXAM:    Most Recent Value  Orbital Region Severe depletion  Upper Arm Region Severe depletion  Thoracic and Lumbar Region Moderate depletion  Buccal Region Moderate depletion  Temple Region Moderate depletion  Clavicle Bone Region Severe depletion  Clavicle and Acromion Bone Region Severe depletion  Scapular Bone Region Moderate depletion  Dorsal Hand Mild depletion  Patellar Region Moderate depletion  Anterior Thigh Region Severe depletion  Posterior Calf Region Moderate depletion  Edema (RD Assessment) None  Hair Reviewed  Eyes Reviewed  Mouth Reviewed  Skin Reviewed  Nails Reviewed       Diet Order:   Diet Order            Diet clear liquid Room service appropriate? Yes; Fluid consistency: Thin  Diet effective now                 EDUCATION NEEDS:   Education needs have been addressed  Skin:  Skin Assessment: Skin Integrity Issues: Incisions: chest, adomen  Last BM:  03/16/20  Height:   Ht Readings from Last 1 Encounters:  02/24/20 '5\' 11"'  (1.803 m)    Weight:   Wt Readings from Last 1 Encounters:  03/20/20 85 kg    Ideal  Body Weight:  78.2 kg  BMI:  Body mass index is 26.14 kg/m.  Estimated Nutritional Needs:   Kcal:  5672-0919  Protein:  140-160 grams  Fluid:  >/= 2.2 L    Gaynell Face, MS, RD, LDN Inpatient Clinical Dietitian Please see [facility] for contact information.

## 2020-03-21 LAB — BASIC METABOLIC PANEL
Anion gap: 12 (ref 5–15)
BUN: 22 mg/dL (ref 8–23)
CO2: 31 mmol/L (ref 22–32)
Calcium: 8.8 mg/dL — ABNORMAL LOW (ref 8.9–10.3)
Chloride: 85 mmol/L — ABNORMAL LOW (ref 98–111)
Creatinine, Ser: 0.65 mg/dL (ref 0.61–1.24)
GFR, Estimated: >60 mL/min (ref >60)
Glucose, Bld: 180 mg/dL — ABNORMAL HIGH (ref 70–99)
Potassium: 4 mmol/L (ref 3.5–5.1)
Sodium: 128 mmol/L — ABNORMAL LOW (ref 135–145)

## 2020-03-21 NOTE — Consult Note (Signed)
Chief Complaint: Patient was seen in consultation today for Lymphangiogram with probable thoracic duct embolization at the request of Dr Servando Snare  Referring Physician(s): Dr Servando Snare  Supervising Physician: Daryll Brod  Patient Status: [facility] - In-pt  History of Present Illness: Brett Dawson is a 64 y.o. 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 Now for Thursday - will start mct tube feeding now - switch back to on with hight fat content Wednesday night if study will be done Thursday  . Discussed with patient   I have seen and discussed Lymphangiogram and embolization with pt He is agreeable to proceed Planned for IR and anesthesia procedure 11/18  Past Medical History:  Diagnosis Date  . Anemia   . Anemia   . Malignant neoplasm of lower third of esophagus ([place]) 11/24/2019    Past Surgical History:  Procedure Laterality Date  . BIOPSY  11/23/2019   Procedure: BIOPSY;  Surgeon: Arta Silence, MD;  Location: [facility];  Service: Endoscopy;;  . ESOPHAGOGASTRODUODENOSCOPY N/A 02/24/2020   Procedure: ESOPHAGOGASTRODUODENOSCOPY (EGD);  Surgeon: Lajuana Matte, MD;  Location: [facility] OR;  Service: Thoracic;  Laterality: N/A;  . ESOPHAGOGASTRODUODENOSCOPY (EGD) WITH PROPOFOL N/A 11/23/2019   Procedure:  ESOPHAGOGASTRODUODENOSCOPY (EGD) WITH PROPOFOL;  Surgeon: Arta Silence, MD;  Location: [facility];  Service: Endoscopy;  Laterality: N/A;  . INTERCOSTAL NERVE BLOCK Right 02/24/2020   Procedure: INTERCOSTAL NERVE BLOCK;  Surgeon: Lajuana Matte, MD;  Location: [facility];  Service: Thoracic;  Laterality: Right;  . TONSILLECTOMY      Allergies: Patient has no known allergies.  Medications: Prior to Admission medications   Medication Sig Start Date End Date Taking? Authorizing Provider  Colchicine 0.6 MG CAPS Take 0.6 mg by mouth 2 (two) times daily as needed (gout).  11/02/19  Yes [provider]  FEROSUL 325 (65 Fe) MG tablet Take 1 tablet (325 mg total) by mouth daily. Patient not taking: Reported on 02/21/2020 12/13/19   [name], MD  ondansetron (ZOFRAN) 8 MG tablet Take 1 tablet (8 mg total) by mouth 2 (two) times daily as needed for refractory nausea / vomiting. Start on day 3 after chemo. 11/27/19   [name], MD  prochlorperazine (COMPAZINE) 10 MG tablet TAKE 1 TABLET(10 MG) BY MOUTH EVERY 6 HOURS AS NEEDED FOR NAUSEA OR VOMITING Patient taking differently: Take 10 mg by mouth every 6 (six) hours as needed for nausea or vomiting.  12/13/19   [name], MD  sucralfate (CARAFATE) 1 g tablet Take 1 tablet (1 g total) by mouth 4 (four) times daily. Dissolve each tablet in 15 cc water before use. Patient taking differently: Take 1 g by mouth 4 (four) times daily as needed (throat pain). Dissolve each tablet in 15 cc water before use.  12/24/19   Kyung Rudd, MD     Family History  Problem Relation Age of Onset  . Hyperlipidemia Mother     Social History   Socioeconomic History  . Marital status: Legally Separated    Spouse name: Not on file  . Number of children: 1  . Years of education: Not on file  . Highest education level: High school graduate  Occupational History  . Occupation: maintance/security at [employer]  Tobacco Use  . Smoking status: Never Smoker   . Smokeless tobacco: Never Used  Vaping Use  . Vaping Use: Former  Substance and Sexual Activity  . Alcohol use: Yes    Alcohol/week: 12.0 standard drinks    Types: 12 Cans of beer per week  . Drug use: Yes    Types: Marijuana  . Sexual activity: Yes  Other Topics Concern  . Not on file  Social History Narrative  . Not on file   Social Determinants of Health   Financial Resource Strain: Low Risk   . Difficulty of Paying Living Expenses: Not hard at all  Food Insecurity: No Food Insecurity  . Worried About Charity fundraiser in the Last Year: Never true  . Ran Out of Food in the Last Year: Never true  Transportation Needs: No Transportation Needs  . Lack of Transportation (Medical): No  . Lack of Transportation (Non-Medical): No  Physical Activity:   . Days of Exercise per Week: Not on file  . Minutes of Exercise per Session: Not on file  Stress: No Stress Concern Present  . Feeling of Stress : Not at all  Social Connections: Moderately Isolated  . Frequency of Communication with Friends and Family: More than three times a week  . Frequency of Social Gatherings with Friends and Family: More than three times a week  . Attends Religious Services: 1 to 4 times per year  . Active Member of Clubs or Organizations: No  . Attends Archivist Meetings: Never  . Marital Status: Separated     Review of Systems: A 12 point ROS discussed and pertinent positives are indicated in the HPI above.  All other systems are negative.  Review of Systems  Constitutional: Positive for fatigue. Negative for fever.  Respiratory: Positive for shortness of breath.   Cardiovascular: Positive for chest pain.  Gastrointestinal: Negative for abdominal pain, nausea and vomiting.  Neurological: Positive for weakness.  Psychiatric/Behavioral: Negative for behavioral problems and confusion.    Vital Signs: BP 101/65 (BP Location: Right Arm)   Pulse 92   Temp 97.8 F (36.6 C) (Oral)    Resp 18   Ht 5\' 11"  (1.803 m)   Wt 186 lb 15.2 oz (84.8 kg)   SpO2 96%   BMI 26.07 kg/m   Physical Exam Vitals reviewed.  HENT:     Mouth/Throat:     Mouth: Mucous membranes are moist.  Cardiovascular:     Rate and Rhythm: Normal rate and regular rhythm.     Heart sounds: Normal heart sounds.  Pulmonary:     Breath sounds: Normal breath sounds.  Abdominal:     Palpations: Abdomen is soft.  Musculoskeletal:        General: Normal range of motion.  Skin:    General: Skin is warm.     Comments: Site of L CT is clean and dry OP 1.8 L in pleurvac Milky yellow thick fluid  1+ air leak  Neurological:     Mental Status: He is  alert.     Imaging: DG Chest 1 View  Result Date: 03/05/2020 CLINICAL DATA:  Post thoracentesis on the left. EXAM: CHEST  1 VIEW COMPARISON:  March 05, 2020 FINDINGS: The left-sided pleural effusion is smaller in the interval after thoracentesis. However, effusion with underlying atelectasis does remain. No pneumothorax. The right-sided chest tube is stable. No definite pneumothorax on the right. The cardiomediastinal silhouette is stable. Atelectasis in the right base. No other acute abnormalities. IMPRESSION: 1. The left-sided pleural effusion is smaller in the interval after thoracentesis. No pneumothorax. 2. No other change. Electronically Signed   By: Dorise Bullion III M.D   On: 03/05/2020 15:05   DG Chest 2 View  Result Date: 03/06/2020 CLINICAL DATA:  Follow-up pneumonia EXAM: CHEST - 2 VIEW COMPARISON:  03/05/2020 FINDINGS: Unchanged position of right chest tube. No pneumothorax identified. Stable cardiomediastinal contours. Large left pleural effusion with veil like opacification of the left mid and left upper lung zones is similar in volume to 03/05/20. No new findings. IMPRESSION: 1. Stable right chest tube. No pneumothorax. 2. Unchanged appearance of left pleural effusion compared with 03/05/20 Electronically Signed   By: Kerby Moors M.D.    On: 03/06/2020 07:44   DG Chest 2 View  Result Date: 02/29/2020 CLINICAL DATA:  Pleural effusion EXAM: CHEST - 2 VIEW COMPARISON:  February 28, 2020. FINDINGS: Chest tubes again noted on the right. Central catheter tip in superior vena cava. Nasogastric tube tip below diaphragm. No appreciable pneumothorax. Fairly small pleural effusions bilaterally with ill-defined airspace opacity in each lung base. Heart is upper normal in size with pulmonary vascularity normal. No adenopathy. No bone lesions. IMPRESSION: Tube and catheter positions as described without pneumothorax. Early small pleural effusions bilaterally. Atelectasis with questionable pneumonia in each lung base. Heart size normal. Electronically Signed   By: Lowella Grip III M.D.   On: 02/29/2020 11:47   DG Chest 2 View  Result Date: 02/22/2020 CLINICAL DATA:  Esophageal neoplasm EXAM: CHEST - 2 VIEW COMPARISON:  PET CT 02/17/2020 FINDINGS: No focal opacity or pleural effusion. Normal cardiomediastinal silhouette. Coil like opacity over the right shoulder is presumably artifact. No pneumothorax. There are degenerative changes of the spine. IMPRESSION: No active cardiopulmonary disease. Electronically Signed   By: Donavan Foil M.D.   On: 02/22/2020 15:59   CT CHEST W CONTRAST  Result Date: 03/07/2020 CLINICAL DATA:  Shortness of breath and hypoxia. Bilateral chest tubes. EXAM: CT CHEST WITH CONTRAST TECHNIQUE: Multidetector CT imaging of the chest was performed during intravenous contrast administration. CONTRAST:  10mL OMNIPAQUE IOHEXOL 300 MG/ML  SOLN COMPARISON:  Radiography same day. FINDINGS: Cardiovascular: Heart size is normal.  No pericardial fluid. Mediastinum/Nodes: Previous esophagectomy and pull-through surgery. Lungs/Pleura: On the right, there is a small amount of pleural fluid layering dependently and there is mild-to-moderate dependent pulmonary atelectasis. Right chest tube is in place. No pleural air. Chest tube is in  communication with the residual pleural density. On the left, there is large pleural fluid collection posteriorly which appears loculated with air bubbles and air-fluid level consistent with empyema. The adjacent dependent left lung shows atelectasis. The left-sided chest tube is not visibly in communication with the loculated collection. No free pleural air. 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 Multiple renal cysts as seen previously. Musculoskeletal: Ordinary thoracic degenerative changes. IMPRESSION: 1. Previous esophagectomy and pull-through surgery. 2. Large loculated pleural fluid collection posteriorly on the left with air bubbles and air-fluid level  consistent with empyema. The left-sided chest tube is not visibly in communication with the loculated collection. 3. Small amount of pleural fluid on the right with dependent pulmonary atelectasis. 4. 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 Electronically Signed   By: Nelson Chimes M.D.   On: 03/07/2020 16:26   DG Chest Port 1 View  Result Date: 03/19/2020 CLINICAL DATA:  Status post esophagectomy and 12 EXAM: PORTABLE CHEST 1 VIEW COMPARISON:  March 07, 2020, March 16, 2020 FINDINGS: The cardiomediastinal silhouette is unchanged in contour.LEFT-sided chest tube. Unchanged small to moderate LEFT pleural effusion. Moderate is opacification of the LEFT lung base most likely reflecting atelectasis. Persistent heterogeneous opacity of the RIGHT peripheral lung base. This is increased in comparison to prior and mildly increased since March 13, 2020. No pneumothorax; there is a skin fold overlying the RIGHT lung. Visualized abdomen is unremarkable. Multilevel degenerative changes of the thoracic spine. IMPRESSION: 1. Unchanged small to moderate LEFT pleural effusion with moderate LEFT basilar atelectasis. LEFT chest tube in place. 2. Persistent heterogeneous opacity  of the RIGHT peripheral lung base, increased in comparison to prior and mildly increased since March 13, 2020. 3. No pneumothorax. Electronically Signed   By: Valentino Saxon MD   On: 03/19/2020 10:03   DG CHEST PORT 1 VIEW  Result Date: 03/16/2020 CLINICAL DATA:  Short of breath, chest tube EXAM: PORTABLE CHEST 1 VIEW COMPARISON:  03/13/2020 FINDINGS: One of 2 pigtail chest tubes on the left has been removed. Moderate left effusion and left lower lobe airspace disease unchanged. No pneumothorax. Mild right lower lobe atelectasis has improved. Negative for heart failure or edema. IMPRESSION: One of 2 left chest tubes removed. No change in left pleural effusion and left lower lobe airspace disease. No pneumothorax Improved right lower lobe airspace disease. Electronically Signed   By: Franchot Gallo M.D.   On: 03/16/2020 07:57   DG CHEST PORT 1 VIEW  Result Date: 03/13/2020 CLINICAL DATA:  Chest tube present.  Shortness of breath EXAM: PORTABLE CHEST 1 VIEW COMPARISON:  March 10, 2020 FINDINGS: Chest tubes present on the left, unchanged. Pneumothorax on the left is smaller with trace pneumothorax remaining on the left. Right chest tube no longer present. There is a skin fold on the right. There is ill-defined airspace opacity in both lower lung regions with bilateral pleural effusions. Heart is upper normal in size with pulmonary vascularity normal, stable. No adenopathy. No bone lesions. There is calcification in the left carotid artery. IMPRESSION: Chest tube on the right has been removed. There are chest tubes on the left, unchanged in position. Trace apical pneumothorax on the left. Fairly small pleural effusions bilaterally with airspace opacity likely representing multifocal pneumonia in the base regions. Stable cardiac silhouette. Electronically Signed   By: Lowella Grip III M.D.   On: 03/13/2020 07:55   DG CHEST PORT 1 VIEW  Result Date: 03/10/2020 CLINICAL DATA:  Pleural effusion.  EXAM: PORTABLE CHEST 1 VIEW COMPARISON:  CT 03/07/2020.  Chest x-ray 03/07/2020. FINDINGS: Right chest tube in stable position. Left lower chest tube in stable position. Interim placement of a second left chest tube just above the left lower chest tube. Interim improvement of left-sided pleural effusion with residual pleural effusion present. No pneumothorax. Persistent bibasilar atelectasis/infiltrates. Small right pleural effusion. Heart size stable. IMPRESSION: Right chest tube in stable position. Left lower chest tube in stable position. Interim placement of a second left chest tube just above the left lower chest  tube. Interim improvement of left-sided pleural effusion with residual moderate pleural effusion present. Persistent bibasilar atelectasis/infiltrates. Electronically Signed   By: Marcello Moores  Register   On: 03/10/2020 07:15   DG CHEST PORT 1 VIEW  Result Date: 03/07/2020 CLINICAL DATA:  Shortness of breath.  Hypoxia. EXAM: PORTABLE CHEST 1 VIEW COMPARISON:  05/2019, 03/05/2020. FINDINGS: Right chest tube in stable position. No pneumothorax. Persistent large left pleural effusion. Underlying atelectasis/infiltrate cannot be excluded. Right base pleural-parenchymal thickening/postsurgical change again noted. Mild right upper lung infiltrate noted on today's exam. Small right pleural effusion again noted. Heart size stable. IMPRESSION: 1. Right chest tube in stable position. 2. No pneumothorax. Persistent large left pleural effusion. Underlying atelectasis/infiltrate cannot be excluded. No interim change in appearance. 3. Mild right upper lung infiltrate noted on today's exam. Right base pleural-parenchymal thickening/postsurgical change again noted. Small right pleural effusion again noted. Electronically Signed   By: Marcello Moores  Register   On: 03/07/2020 08:49   DG CHEST PORT 1 VIEW  Result Date: 03/06/2020 CLINICAL DATA:  Chest tube in place. EXAM: PORTABLE CHEST 1 VIEW COMPARISON:  Same day. FINDINGS:  Stable cardiomediastinal silhouette. Right-sided chest tube is unchanged with no pneumothorax present. Stable moderate left pleural effusion is noted with associated atelectasis or pneumonia. Bony thorax is unremarkable. IMPRESSION: Stable right-sided chest tube with no pneumothorax present. Stable moderate left pleural effusion with associated atelectasis or pneumonia. Electronically Signed   By: Marijo Conception M.D.   On: 03/06/2020 09:22   DG Chest Port 1 View  Result Date: 03/05/2020 CLINICAL DATA:  Left pleural effusion.  Follow-up exam. EXAM: PORTABLE CHEST 1 VIEW COMPARISON:  03/03/2020. FINDINGS: Opacity on the left as mildly increased into the left upper lung/hemithorax, consistent with enlargement of the left pleural effusion and/or an increase in left lung atelectasis or consolidation. There is hazy and linear/reticular opacity at the right lung base which has increased from prior exam also consistent with an increase in pleural fluid and atelectasis. Right-sided chest tube is stable.  No pneumothorax. IMPRESSION: 1. Mild interval worsening of lung aeration with an increase in opacity in the left mid to upper lung and at the right lung base consistent with a combination an increase in pleural effusions and atelectasis and/or infiltrate. No evidence of pulmonary edema. 2. Stable right-sided chest tube.  No pneumothorax. Electronically Signed   By: Lajean Manes M.D.   On: 03/05/2020 05:52   DG CHEST PORT 1 VIEW  Result Date: 03/03/2020 CLINICAL DATA:  Cough. EXAM: PORTABLE CHEST 1 VIEW COMPARISON:  Chest x-ray 02/29/2020. FINDINGS: Interval removal of right IJ line. Interval removal of 1 of the 2 chest tubes. Remaining chest tube in stable position. No pneumothorax. Heart size normal. Progressive left pleural effusion. Underlying infiltrate most likely present. Stable small right pleural effusion and right base atelectasis. IMPRESSION: 1. Interval removal of right IJ line. Interval removal of 1  of the 2 chest tubes. Remaining chest tube in stable position. No pneumothorax. 2. Progressive left pleural effusion. Underlying infiltrate most likely present. Stable small right pleural effusion and right base atelectasis. Electronically Signed   By: Marcello Moores  Register   On: 03/03/2020 08:14   DG Chest Port 1 View  Result Date: 02/28/2020 CLINICAL DATA:  Pleural effusion. EXAM: PORTABLE CHEST 1 VIEW COMPARISON:  02/27/2020 FINDINGS: 0538 hours. The NG tube passes into the stomach although the distal tip position is not included on the film. Right-sided chest tubes again noted without evidence for pneumothorax. Right IJ central line tip  overlies the mid SVC level. Similar appearance left base collapse/consolidation with probable layering left pleural effusion. Telemetry leads overlie the chest. IMPRESSION: 1. Stable exam. Right-sided chest tubes without evidence for pneumothorax. 2. Similar appearance of left base collapse/consolidation with probable layering left pleural effusion. Electronically Signed   By: Misty Stanley M.D.   On: 02/28/2020 06:50   DG CHEST PORT 1 VIEW  Result Date: 02/27/2020 CLINICAL DATA:  Pleural effusion EXAM: PORTABLE CHEST 1 VIEW COMPARISON:  Chest x-ray dated 02/26/2020. FINDINGS: Stable opacity at the LEFT lung base, likely atelectasis and/or layering pleural effusion. RIGHT lung is clear. RIGHT-sided chest tubes are stable in position. No pneumothorax is seen. Cardiomegaly. Enteric tube passes below the diaphragm. RIGHT IJ central line appears adequately positioned with tip at the level of the mid SVC. IMPRESSION: 1. Stable opacity at the LEFT lung base, likely atelectasis and/or layering pleural effusion. 2. RIGHT lung is clear. 3. RIGHT-sided chest tubes are stable in position. No pneumothorax is seen. Electronically Signed   By: Franki Cabot M.D.   On: 02/27/2020 08:19   DG CHEST PORT 1 VIEW  Result Date: 02/26/2020 CLINICAL DATA:  Pneumothorax EXAM: PORTABLE CHEST 1  VIEW COMPARISON:  Two days ago FINDINGS: Increased hazy density at the left base. Probable trace left apical pneumothorax. Right chest tubes in place with no visible right pneumothorax. Right IJ line with tip at the SVC. The enteric tube reaches the stomach. Probable pneumoperitoneum, expected after recent surgery. Normal heart size for technique. IMPRESSION: 1. Increased hazy opacity at the left more than right base, likely atelectasis and pleural fluid. 2. Probable trace left apical pneumothorax. Electronically Signed   By: Monte Fantasia M.D.   On: 02/26/2020 08:22   DG Chest Port 1 View  Result Date: 02/24/2020 CLINICAL DATA:  History of recent esophagectomy. EXAM: PORTABLE CHEST 1 VIEW COMPARISON:  February 22, 2020 FINDINGS: A nasogastric tube is seen with its distal tip extending into the body of the stomach. A right internal jugular venous catheter is noted with its distal tip overlying the junction of the superior vena cava and right atrium. 2 right-sided chest tubes are seen. The distal tips of both are seen overlying the medial aspect of the right apex. Very mild left basilar atelectasis and/or early infiltrate is seen. There is no evidence of a pleural effusion or pneumothorax. The heart size and mediastinal contours are within normal limits. A very thin curvilinear lucency is seen just below the right hemidiaphragm. Multilevel degenerative changes seen throughout the thoracic spine. IMPRESSION: 1. Interval support line placement positioning, as described above, with very mild left basilar atelectasis and/or infiltrate. 2. Findings suspicious for a very small amount of free air below the right hemidiaphragm, likely related to the patient's recent surgical intervention. Electronically Signed   By: Virgina Norfolk M.D.   On: 02/24/2020 19:57   DG Swallowing Func-Speech Pathology  Result Date: 03/01/2020 Objective Swallowing Evaluation: Type of Study: MBS-Modified Barium Swallow Study  Patient  Details Name: ISON WICHMANN MRN: [id number] Date of Birth: Nov 06, 1955 Today's Date: 03/01/2020 Time: SLP Start Time (ACUTE ONLY): 0930 -SLP Stop Time (ACUTE ONLY): 0950 SLP Time Calculation (min) (ACUTE ONLY): 20 min Past Medical History: Past Medical History: Diagnosis Date . Anemia  . Anemia  . Malignant neoplasm of lower third of esophagus ([place]) 11/24/2019 Past Surgical History: Past Surgical History: Procedure Laterality Date . BIOPSY  11/23/2019  Procedure: BIOPSY;  Surgeon: Arta Silence, MD;  Location: [facility];  Service: Endoscopy;; .  ESOPHAGOGASTRODUODENOSCOPY N/A 02/24/2020  Procedure: ESOPHAGOGASTRODUODENOSCOPY (EGD);  Surgeon: Lajuana Matte, MD;  Location: [facility] OR;  Service: Thoracic;  Laterality: N/A; . ESOPHAGOGASTRODUODENOSCOPY (EGD) WITH PROPOFOL N/A 11/23/2019  Procedure: ESOPHAGOGASTRODUODENOSCOPY (EGD) WITH PROPOFOL;  Surgeon: Arta Silence, MD;  Location: [facility];  Service: Endoscopy;  Laterality: N/A; . INTERCOSTAL NERVE BLOCK Right 02/24/2020  Procedure: INTERCOSTAL NERVE BLOCK;  Surgeon: Lajuana Matte, MD;  Location: [place];  Service: Thoracic;  Laterality: Right; . 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 Per EMR, he had lost approximately 60 pounds after diagnosis, but did not have much dysphagia. He presented on 10/21 for Ivor Lewis esophagectomy and J-tube placement.  No data recorded Assessment / Plan / Recommendation CHL IP CLINICAL IMPRESSIONS 03/01/2020 Clinical Impression Pt presents with mild pharyngeal dysphagia which is likely his baseline and did not impact safety. He exhibited reduced lingual retraction which resulted in vallecular residue with mechanical soft solids and regular textures. Pt independently demonstrated 1-2 dry secondary swallows which cleared residue. Transient penetration (PAS 2) of  thin liquids was observed with the 13-mm barium tablet, but this is considered to be WNL. Pt may have up to regular texture solids and thin liquids from an oropharyngeal standpoint. However, per surgery's recommendation, he will be started on clear liquids at this time and further diet advancement will be deferred to surgery. SLP will follow briefly to ensure diet tolerance. SLP Visit Diagnosis Dysphagia, pharyngeal phase (R13.13) Attention and concentration deficit following -- Frontal lobe and executive function deficit following -- Impact on safety and function No limitations   CHL IP TREATMENT RECOMMENDATION 03/01/2020 Treatment Recommendations Therapy as outlined in treatment plan below   Prognosis 03/01/2020 Prognosis for Safe Diet Advancement Good Barriers to Reach Goals -- Barriers/Prognosis Comment -- [facility]L IP DIET RECOMMENDATION 03/01/2020 SLP Diet Recommendations Thin liquid with further advancement at surgery's discretion Liquid Administration via Cup;Straw;Spoon Medication Administration Via alternative means Compensations Slow rate Postural Changes Remain semi-upright after after feeds/meals (Comment);Seated upright at 90 degrees   CHL IP OTHER RECOMMENDATIONS 03/01/2020 Recommended Consults -- Oral Care Recommendations Oral care BID Other Recommendations --   CHL IP FOLLOW UP RECOMMENDATIONS 03/01/2020 Follow up Recommendations None   CHL IP FREQUENCY AND DURATION 03/01/2020 Speech Therapy Frequency (ACUTE ONLY) min 2x/week Treatment Duration 1 week      CHL IP ORAL PHASE 03/01/2020 Oral Phase WFL Oral - Pudding Teaspoon -- Oral - Pudding Cup -- Oral - Honey Teaspoon -- Oral - Honey Cup -- Oral - Nectar Teaspoon -- Oral - Nectar Cup -- Oral - Nectar Straw -- Oral - Thin Teaspoon -- Oral - Thin Cup -- Oral - Thin Straw -- Oral - Puree -- Oral - Mech Soft -- Oral - Regular -- Oral - Multi-Consistency -- Oral - Pill -- Oral Phase - Comment --  CHL IP PHARYNGEAL PHASE 03/01/2020 Pharyngeal Phase Impaired  Pharyngeal- Pudding Teaspoon -- Pharyngeal -- Pharyngeal- Pudding Cup -- Pharyngeal -- Pharyngeal- Honey Teaspoon -- Pharyngeal -- Pharyngeal- Honey Cup -- Pharyngeal -- Pharyngeal- Nectar Teaspoon -- Pharyngeal -- Pharyngeal- Nectar Cup -- Pharyngeal -- Pharyngeal- Nectar Straw -- Pharyngeal -- Pharyngeal- Thin Teaspoon -- Pharyngeal -- Pharyngeal- Thin Cup -- Pharyngeal -- Pharyngeal- Thin Straw -- Pharyngeal -- Pharyngeal- Puree WFL Pharyngeal -- Pharyngeal- Mechanical Soft Reduced tongue base retraction;Pharyngeal residue - valleculae Pharyngeal -- Pharyngeal- Regular Reduced tongue base  retraction;Pharyngeal residue - valleculae Pharyngeal -- Pharyngeal- Multi-consistency -- Pharyngeal -- Pharyngeal- Pill Reduced tongue base retraction;Penetration/Aspiration during swallow Pharyngeal Material enters airway, remains ABOVE vocal cords then ejected out Pharyngeal Comment --  CHL IP CERVICAL ESOPHAGEAL PHASE 03/01/2020 Cervical Esophageal Phase WFL Pudding Teaspoon -- Pudding Cup -- Honey Teaspoon -- Honey Cup -- Nectar Teaspoon -- Nectar Cup -- Nectar Straw -- Thin Teaspoon -- Thin Cup -- Thin Straw -- Puree -- Mechanical Soft -- Regular -- Multi-consistency -- Pill -- Cervical Esophageal Comment -- Shanika I. Hardin Negus, [place], [place] Office number [phone] Pager [phone] Horton Marshall 03/01/2020, 10:32 AM              CT IMAGE GUIDED DRAINAGE BY PERCUTANEOUS CATHETER  Result Date: 03/08/2020 INDICATION: 64 year old male with large left hydropneumothorax status post esophagectomy and gastric pull-through procedure. He presents for CT-guided drain placement. EXAM: CT-guided chest tube MEDICATIONS: The patient is currently admitted to the hospital and receiving intravenous antibiotics. The antibiotics were administered within an appropriate time frame prior to the initiation of the procedure. ANESTHESIA/SEDATION: Fentanyl 50 mcg IV; Versed 1 mg IV Moderate Sedation Time:   16 minutes The patient was continuously monitored during the procedure by the interventional radiology nurse under my direct supervision. COMPLICATIONS: None immediate. PROCEDURE: Informed written consent was obtained from the patient after a thorough discussion of the procedural risks, benefits and alternatives. All questions were addressed. Maximal Sterile Barrier Technique was utilized including caps, mask, sterile gowns, sterile gloves, sterile drape, hand hygiene and skin antiseptic. A timeout was performed prior to the initiation of the procedure. A planning axial CT scan was performed. The large hydropneumothorax was identified in the left hemithorax. A suitable skin entry site was selected and marked. The overlying skin was sterilely prepped and draped in the standard fashion using chlorhexidine skin prep. Local anesthesia was attained by infiltration with 1% lidocaine. A small dermatotomy was made. Under intermittent CT guidance, an 18 gauge trocar needle was advanced into the collection. A 0.035 wire was then advanced into the pleural space. The needle was removed. The skin tract was dilated to 12 [place]. A Cook 12 [place] all-purpose drainage catheter was advanced over the wire and formed in the hydropneumothorax. The catheter was connected to a pleura vac and wall suction. Follow-up CT imaging demonstrates a well-positioned percutaneous chest tube and decreasing hydropneumothorax. The catheter was secured to the skin with 0 Prolene suture. Sterile bandages were applied. IMPRESSION: Successful placement of a left-sided 68 French percutaneous thoracostomy tube. Electronically Signed   By: Jacqulynn Cadet M.D.   On: 03/08/2020 16:27   DG ESOPHAGUS W SINGLE CM (SOL OR THIN BA)  Result Date: 03/06/2020 CLINICAL DATA:  Esophageal cancer with gastric pull-through and mid high anastomosis about the vertical level of the AP window. Pleural effusions. EXAM: ESOPHOGRAM/BARIUM SWALLOW TECHNIQUE: Single contrast  examination was performed using 150 cc Omnipaque 300. FLUOROSCOPY TIME:  Fluoroscopy Time:  3 minutes, 48 seconds Radiation Exposure Index (if provided by the fluoroscopic device): 46.2 mGy Number of Acquired Spot Images: 0 COMPARISON:  02/29/2020 FINDINGS: A right chest tube is in place. Gastric pull-through noted with mild irregularity of the lumen along the gastroesophageal staple line for example on series 19. While a free leak is not identified, there is a small outpouching along the anastomotic staple line for example on series 28 posteriorly. This is also shown on series 37. The stomach is nearly entirely in the chest, with the duodenal bulb just below  the diaphragm. Contrast slowly extends into the duodenal bulb and descending duodenum. IMPRESSION: 1. Along the anastomotic site there is a small contained collection of contrast which could be from an ulceration or potentially a small contained perforation. However, I do not observe a frank leak of contrast or free spread of contrast from this region. Electronically Signed   By: Van Clines M.D.   On: 03/06/2020 11:10   DG ESOPHAGUS W SINGLE CM (SOL OR THIN BA)  Result Date: 02/29/2020 CLINICAL DATA:  Postop day 5 esophagectomy for esophageal cancer. EXAM: ESOPHOGRAM/BARIUM SWALLOW TECHNIQUE: Single contrast examination was performed using 150 mL Omnipaque 300 p.o. FLUOROSCOPY TIME:  Fluoroscopy Time:  2 minutes 54 second Radiation Exposure Index (if provided by the fluoroscopic device): Number of Acquired Spot Images: 17 COMPARISON:  CT chest 11/30/2019 FINDINGS: The patient is able to stand for the study and swallow adequate amounts of contrast. Postop distal esophagectomy with gastric pull-through. No leak identified. No obstruction. Contrast flows through the pylorus without obstruction. There is an NG tube across the anastomosis with the tip in the body the stomach. IMPRESSION: Postop distal esophagectomy with gastric pull-through. No leak or  obstruction identified. Normal emptying of the stomach into the duodenum. Electronically Signed   By: Franchot Gallo M.D.   On: 02/29/2020 10:00   US THORACENTESIS ASP PLEURAL SPACE W/IMG GUIDE  Result Date: 03/07/2020 INDICATION: Shortness of breath. Left-sided pleural effusion. Request for diagnostic and therapeutic thoracentesis. EXAM: ULTRASOUND GUIDED LEFT THORACENTESIS MEDICATIONS: 1% plain lidocaine, 5 mL COMPLICATIONS: None immediate. PROCEDURE: An ultrasound guided thoracentesis was thoroughly discussed with the patient and questions answered. The benefits, risks, alternatives and complications were also discussed. The patient understands and wishes to proceed with the procedure. Written consent was obtained. Ultrasound of the left chest demonstrates partially loculated moderate-sized pleural effusion. Ultrasound was performed to localize and mark an adequate pocket of fluid in the left chest. The area was then prepped and draped in the normal sterile fashion. 1% Lidocaine was used for local anesthesia. Under ultrasound guidance a 6 Fr Safe-T-Centesis catheter was introduced. Thoracentesis was performed. The catheter was removed and a dressing applied. FINDINGS: A total of approximately 1 L of hazy, amber colored fluid was removed. Samples were sent to the laboratory as requested by the clinical team. IMPRESSION: Successful ultrasound guided left thoracentesis yielding 1 L of pleural fluid. Read by: Ascencion Dike PA-C Electronically Signed   By: Corrie Mckusick D.O.   On: 03/05/2020 14:49    Labs:  CBC: Recent Labs    03/13/20 0045 03/16/20 0114 03/19/20 0043 03/20/20 0223  WBC 7.3 9.6 6.8 5.6  HGB 9.4* 9.6* 9.9* 10.2*  HCT 29.3* 28.9* 29.5* 30.5*  PLT 314 328 372 463*    COAGS: Recent Labs    09/18/19 0808 02/22/20 0830 03/08/20 0015  INR 1.2 1.0 1.1  APTT  --  30  --     BMP: Recent Labs    12/27/19 0810 12/27/19 0810 01/03/20 0813 01/03/20 0813 01/11/20 0814  01/11/20 0814 01/26/20 1426 02/22/20 0830 03/16/20 0114 03/19/20 0043 03/20/20 0223 03/21/20 0045  NA 136   < > 140   < > 139   < > 140   < > 134* 131* 128* 128*  K 4.0   < > 3.9   < > 4.2   < > 4.2   < > 4.0 3.8 4.2 4.0  CL 107   < > 106   < > 105   < >  106   < > 100 92* 89* 85*  CO2 23   < > 27   < > 29   < > 29   < > 26 29 30 31   GLUCOSE 117*   < > 127*   < > 105*   < > 99   < > 153* 155* 149* 180*  BUN 14   < > 12   < > 11   < > 10   < > 12 21 22 22   CALCIUM 9.4   < > 9.4   < > 9.0   < > 8.8*   < > 7.7* 8.3* 8.5* 8.8*  CREATININE 0.66   < > 0.63   < > 0.68   < > 0.71   < > 0.54* 0.59* 0.61 0.65  GFRNONAA >60   < > >60   < > >60   < > >60   < > >60 >60 >60 >60  GFRAA >60  --  >60  --  >60  --  >60  --   --   --   --   --    < > = values in this interval not displayed.    LIVER FUNCTION TESTS: Recent Labs    01/11/20 0814 01/26/20 1426 02/22/20 0830 03/19/20 0043  BILITOT <0.2* 0.3 0.5 0.6  AST 37 29 29 96*  ALT 49* 21 19 152*  ALKPHOS 71 96 73 156*  PROT 6.4* 6.3* 6.8 7.1  ALBUMIN 3.1* 3.1* 3.7 1.8*    TUMOR MARKERS: No results for input(s): AFPTM, CEA, CA199, CHROMGRNA in the last 8760 hours.  Assessment and Plan:  Esophageal cancer Surgery with TCTS 26 days ago Post Left chylous pleural effusion-- Chest tube placed in IR 11/3----persistently high output Now scheduled for Lymphangiogram with probable thoracic duct embolization - scheduled with anesthesia and IR 11/18  Pt is aware of procedure benefits and risks including but not limited to infection; bleeding; damage to surrounding structures. Agreeable to proceed Consent signed and in chart  Thank you for this interesting consult.  I greatly enjoyed meeting JESAIAH FABIANO and look forward to participating in their care.  A copy of this report was sent to the requesting provider on this date.  Electronically Signed: Lavonia Drafts, PA-C 03/21/2020, 9:25 AM   I spent a total of 40 Minutes    in face to  face in clinical consultation, greater than 50% of which was counseling/coordinating care for lymphangiogram with prob thoracic duct embolization

## 2020-03-21 NOTE — Plan of Care (Signed)
  Problem: Education: Goal: Knowledge of General Education information will improve Description: Including pain rating scale, medication(s)/side effects and non-pharmacologic comfort measures Outcome: Progressing   Problem: Clinical Measurements: Goal: Ability to maintain clinical measurements within normal limits will improve Outcome: Progressing   Problem: Clinical Measurements: Goal: Diagnostic test results will improve Outcome: Progressing   Problem: Clinical Measurements: Goal: Respiratory complications will improve Outcome: Progressing   Problem: Activity: Goal: Risk for activity intolerance will decrease Outcome: Progressing   Problem: Nutrition: Goal: Adequate nutrition will be maintained Outcome: Progressing   Problem: Pain Managment: Goal: General experience of comfort will improve Outcome: Progressing   Problem: Skin Integrity: Goal: Risk for impaired skin integrity will decrease Outcome: Progressing   

## 2020-03-21 NOTE — Progress Notes (Addendum)
      [address]       [place] [address]             [phone]      26 Days Post-Op Procedure(s) (LRB): XI ROBOTIC ASSISTED IVOR LEWIS ESOPHAGECTOMY (N/A) LAPARSCOPIC  JEJUNOSTOMY TUBE PLACEMENT (N/A) ESOPHAGOGASTRODUODENOSCOPY (EGD) (N/A) INTERCOSTAL NERVE BLOCK (Right) Subjective: No new concerns.   TF infusing, having appropriate bowel function.   Continues to have high-volume left pleural drainage.    Objective: Vital signs in last 24 hours: Temp:  [97.8 F (36.6 C)-99.4 F (37.4 C)] 97.8 F (36.6 C) (11/16 0750) Pulse Rate:  [92-101] 92 (11/16 0750) Cardiac Rhythm: Normal sinus rhythm (11/16 0700) Resp:  [16-20] 18 (11/16 0750) BP: (101-114)/(65-74) 101/65 (11/16 0750) SpO2:  [94 %-98 %] 96 % (11/16 0750) Weight:  [84.8 kg] 84.8 kg (11/16 0514)     Intake/Output from previous day: 11/15 0701 - 11/16 0700 In: 786.8 [P.O.:237; NG/GT:369.8] Out: 3100 [Urine:1300; Chest Tube:1800] Intake/Output this shift: No intake/output data recorded.  General appearance: alert, cooperative and mild distress Heart: regular rate and rhythm Lungs: clear to auscultation bilaterally. Remaining left pleural tube drained ~1820ml past 24 hours. Abdomen: soft, bowel sounds normal; tenderness a J-tube exit site dressing is dry.. Extremities: no peripheral edema Wound: port incisions have healed.   Lab Results: Recent Labs    03/19/20 0043 03/20/20 0223  WBC 6.8 5.6  HGB 9.9* 10.2*  HCT 29.5* 30.5*  PLT 372 463*   BMET:  Recent Labs    03/20/20 0223 03/21/20 0045  NA 128* 128*  K 4.2 4.0  CL 89* 85*  CO2 30 31  GLUCOSE 149* 180*  BUN 22 22  CREATININE 0.61 0.65  CALCIUM 8.5* 8.8*    PT/INR: No results for input(s): LABPROT, INR in the last 72 hours. ABG    Component Value Date/Time   PHART 7.454 (H) 02/25/2020 0500   HCO3 26.5 02/25/2020 0500   TCO2 27 02/24/2020 1944   ACIDBASEDEF 1.0 02/24/2020 1547   O2SAT 93.7 02/25/2020 0500    CBG (last 3)  Recent Labs    03/19/20 0621  GLUCAP 169*    Assessment/Plan: S/P Procedure(s) (LRB): XI ROBOTIC ASSISTED IVOR LEWIS ESOPHAGECTOMY (N/A) LAPARSCOPIC  JEJUNOSTOMY TUBE PLACEMENT (N/A) ESOPHAGOGASTRODUODENOSCOPY (EGD) (N/A) INTERCOSTAL NERVE BLOCK (Right)  1. CV- NSR 90-100, BP stable 2. Pulm- remains on 6L [place] to maintain O2 sats. CXR with patchy densities, re-check in AM.  High-volume left pleural drainage with elevated triglycerides.  IR planning for lymphangiogram and possible embolization on Thursday, 11/18.  Changed TF to Vivonex RTF per RD recommendation.  3. Renal-UO 1395ml but I/O even with high CT drainage.   4. Endo-blood glucose well controlled, stopped glucometers.  5. GI-tolerating nocturnal tube feeds. Continue clear liquid diet 6. ID-WBC within normal limits.  No recent fevers. Had Zosyn x 11 days, switched to Augmentin.  7. Lovenox for DVT prophylaxis   LOS: 26 days    Brett Dawson, Hershal Coria [phone] 03/21/2020  Waiting for lymphangiogram and poss occlusion of thoracic duct now for Thursday Patient stable but last several days output has remained high- on vivonex rtf currently I have seen and examined Brett Dawson and agree with the above assessment  and plan.  Grace Isaac MD Beeper [phone] Office [phone] 03/21/2020 5:57 PM

## 2020-03-22 ENCOUNTER — Inpatient Hospital Stay (HOSPITAL_COMMUNITY): Payer: 59

## 2020-03-22 NOTE — Progress Notes (Signed)
Occupational Therapy Treatment Patient Details Name: Brett Dawson MRN: [id number] DOB: [birth year] Today's Date: 03/22/2020    History of present illness 64 yo male with Esophageal cancer. s/p 02/24/20 robotic assisted Ivor-Lewis esophagectomy with jejunostomy tube placement. Pt with increasing shortness of breath and fever with chest tubes placed x 2 (11/1 and 11/2)   OT comments  Pt agreeable only to toileting on BSC and grooming following. Min guard assist to transfer, set up for grooming. Pt continues to require HFNC with decreased activity tolerance. To have lymphaniogram and embolization tomorrow.   Follow Up Recommendations  Home health OT;Supervision/Assistance - 24 hour    Equipment Recommendations  3 in 1 bedside commode    Recommendations for Other Services      Precautions / Restrictions Precautions Precautions: Fall;Other (comment) Precaution Comments: chest tube, gtube       Mobility Bed Mobility Overal bed mobility: Needs Assistance Bed Mobility: Supine to Sit;Sit to Supine     Supine to sit: Supervision Sit to supine: Supervision      Transfers                      Balance Overall balance assessment: Needs assistance   Sitting balance-Leahy Scale: Good     Standing balance support: No upper extremity supported;During functional activity Standing balance-Leahy Scale: Fair                             ADL either performed or assessed with clinical judgement   ADL Overall ADL's : Needs assistance/impaired     Grooming: Wash/dry hands;Wash/dry face;Sitting;Set up                   Toilet Transfer: Min guard;BSC;Stand-pivot   Toileting- Clothing Manipulation and Hygiene: Set up;Min guard;Sitting/lateral lean         General ADL Comments: pt on 10L HFNC     Vision       Perception     Praxis      Cognition Arousal/Alertness: Awake/alert Behavior During Therapy: Flat affect Overall Cognitive Status:  Within Functional Limits for tasks assessed                                          Exercises     Shoulder Instructions       General Comments      Pertinent Vitals/ Pain       Pain Assessment: Faces Faces Pain Scale: Hurts little more Pain Location: back pain Pain Descriptors / Indicators: Aching Pain Intervention(s): Monitored during session  Home Living                                          Prior Functioning/Environment              Frequency  Min 2X/week        Progress Toward Goals  OT Goals(current goals can now be found in the care plan section)  Progress towards OT goals: Not progressing toward goals - comment (pt with decreased activity tolerance)  Acute Rehab OT Goals Patient Stated Goal: to be I again OT Goal Formulation: With patient Time For Goal Achievement: 03/27/20 Potential to Achieve Goals: Good  Plan Discharge plan remains appropriate  Co-evaluation          OT goals addressed during session: ADL's and self-care      AM-PAC OT "6 Clicks" Daily Activity     Outcome Measure   Help from another person eating meals?: None Help from another person taking care of personal grooming?: A Little Help from another person toileting, which includes using toliet, bedpan, or urinal?: A Little Help from another person bathing (including washing, rinsing, drying)?: A Little Help from another person to put on and taking off regular upper body clothing?: A Little Help from another person to put on and taking off regular lower body clothing?: A Little 6 Click Score: 19    End of Session Equipment Utilized During Treatment: Oxygen  OT Visit Diagnosis: Muscle weakness (generalized) (M62.81)   Activity Tolerance Patient tolerated treatment well   Patient Left in bed;with call bell/phone within reach   Nurse Communication          Time: 7619-5093 OT Time Calculation (min): 15 min  Charges: OT  General Charges $OT Visit: 1 Visit OT Treatments $Self Care/Home Management : 8-22 mins  Nestor Lewandowsky, OTR/L Acute Rehabilitation Services Pager: [phone] Office: [phone]   Malka So 03/22/2020, 10:02 AM

## 2020-03-22 NOTE — Progress Notes (Signed)
Tube feeding stopped and pt now NPO for scheduled procedure in AM. Will continue to monitor.

## 2020-03-22 NOTE — Progress Notes (Addendum)
      [address]       [place] [address]             [phone]      27 Days Post-Op Procedure(s) (LRB): XI ROBOTIC ASSISTED IVOR LEWIS ESOPHAGECTOMY (N/A) LAPARSCOPIC  JEJUNOSTOMY TUBE PLACEMENT (N/A) ESOPHAGOGASTRODUODENOSCOPY (EGD) (N/A) INTERCOSTAL NERVE BLOCK (Right) Subjective: No new concerns.   TF infusing, having appropriate bowel function.   Continues to have high-volume left pleural drainage.    Objective: Vital signs in last 24 hours: Temp:  [97.2 F (36.2 C)-98.7 F (37.1 C)] 97.2 F (36.2 C) (11/17 0802) Pulse Rate:  [94-106] 99 (11/17 0802) Cardiac Rhythm: Normal sinus rhythm (11/17 0700) Resp:  [15-20] 19 (11/17 0802) BP: (103-119)/(63-71) 114/63 (11/17 0802) SpO2:  [96 %-98 %] 96 % (11/17 0802)     Intake/Output from previous day: 11/16 0701 - 11/17 0700 In: 540 [P.O.:540] Out: 2255 [Urine:1125; Chest Tube:1130] Intake/Output this shift: No intake/output data recorded.  General appearance: alert, cooperative and mild distress Heart: regular rate and rhythm Lungs: clear to auscultation bilaterally. Remaining left pleural tube drained ~18102ml past 24 hours. Abdomen: soft, bowel sounds normal; tenderness a J-tube exit site dressing is dry.. Extremities: no peripheral edema Wound: port incisions have healed.   Lab Results: Recent Labs    03/20/20 0223  WBC 5.6  HGB 10.2*  HCT 30.5*  PLT 463*   BMET:  Recent Labs    03/20/20 0223 03/21/20 0045  NA 128* 128*  K 4.2 4.0  CL 89* 85*  CO2 30 31  GLUCOSE 149* 180*  BUN 22 22  CREATININE 0.61 0.65  CALCIUM 8.5* 8.8*    PT/INR: No results for input(s): LABPROT, INR in the last 72 hours. ABG    Component Value Date/Time   PHART 7.454 (H) 02/25/2020 0500   HCO3 26.5 02/25/2020 0500   TCO2 27 02/24/2020 1944   ACIDBASEDEF 1.0 02/24/2020 1547   O2SAT 93.7 02/25/2020 0500   CBG (last 3)  No results for input(s): GLUCAP in the last 72  hours.  Assessment/Plan: S/P Procedure(s) (LRB): XI ROBOTIC ASSISTED IVOR LEWIS ESOPHAGECTOMY (N/A) LAPARSCOPIC  JEJUNOSTOMY TUBE PLACEMENT (N/A) ESOPHAGOGASTRODUODENOSCOPY (EGD) (N/A) INTERCOSTAL NERVE BLOCK (Right)  1. CV- NSR 90-100, BP stable 2. Pulm- remains on 6L [place] to maintain O2 sats. CXR with patchy densities, re-check in AM.  High-volume left pleural drainage with elevated triglycerides.  IR planning for lymphangiogram and possible embolization tomorrow by IR.  Continue Vivonex RTF per RD recommendation.  3. Renal-UO stable.   4. Endo-blood glucose well controlled, stopped glucometers.  5. GI-tolerating nocturnal tube feeds. Continue clear liquid diet 6. ID-WBC within normal limits.  No recent fevers. Had Zosyn x 11 days, switched to Augmentin. Will re-assess the J-Tube wound later today, suspect we can d/c the Augmentin soon.  7. Lovenox for DVT prophylaxis   LOS: 66 days    [name], PA-C [phone] 03/22/2020  No orders for lymphangiogram  tomorrow - will check if will occur I have seen and examined [name] and agree with the above assessment  and plan.  Grace Isaac MD Beeper [phone] Office [phone] 03/22/2020 6:01 PM

## 2020-03-22 NOTE — Progress Notes (Signed)
Per Dr. Everrett Coombe request, Paged Dr. Kathlene Cote in reference to Pt's procedure for tomorrow in intervention radiology.  At this time there are no order for Pt, except for NPO after midnight.  Dr. Servando Snare was under the understanding that the tube feeding feeding formula was to be changed today .   Received call back from Dr. Kathlene Cote, he advised that all that needed to be done was that the Pt needed to be NPO after midnight, included the tube feeding to be turned off.  He was not concerned about any changes with the formula or rate of the tube feeding.. just that we make sure that it is turned off.  He said a whole team is working on the procedure, and that they will take care of everything tomorrow, and told again, that all we needed to do on our end at this time, was to not feed him, give him anything during and turn off the tube feeding at midnight night.  I tried to find Dr. Servando Snare advise this, but he had left the unit.

## 2020-03-22 NOTE — Anesthesia Preprocedure Evaluation (Addendum)
Anesthesia Evaluation  Patient identified by MRN, date of birth, ID band Patient awake    Reviewed: Allergy & Precautions, NPO status , Patient's Chart, lab work & pertinent test results  Airway Mallampati: III  TM Distance: >3 FB Neck ROM: Full    Dental no notable dental hx. 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   Reproductive/Obstetrics negative OB ROS                            Anesthesia Physical Anesthesia Plan  ASA: III  Anesthesia Plan: General   Post-op Pain Management:    Induction: Intravenous  PONV Risk Score and Plan: Treatment may vary due to age or medical condition, Ondansetron, Dexamethasone and Midazolam  Airway Management Planned: Oral ETT  Additional Equipment: None  Intra-op Plan:   Post-operative Plan: Extubation in OR  Informed Consent: I have reviewed the patients History and Physical, chart, labs and discussed the procedure including the risks, benefits and alternatives for the proposed anesthesia with the patient or authorized representative who has indicated his/her understanding and acceptance.     Dental advisory given  Plan Discussed with: CRNA and Anesthesiologist  Anesthesia Plan Comments: (2  IVs)        Anesthesia Quick Evaluation

## 2020-03-22 NOTE — Progress Notes (Signed)
PT Cancellation Note  Patient Details Name: Brett Dawson MRN: [id number] DOB: May 08, 1955   Cancelled Treatment:    Reason Eval/Treat Not Completed: Patient declined, no reason specified Despite maximal encouragement and education on need for mobility. Pt reports he won't do anything until after surgery. PT will follow back tomorrow as able after surgery.  Alyssa Rotondo B. Migdalia Dk PT, DPT Acute Rehabilitation Services Pager [phone] Office [phone]    [place] 03/22/2020, 3:40 PM

## 2020-03-22 NOTE — Plan of Care (Signed)

## 2020-03-23 ENCOUNTER — Encounter (HOSPITAL_COMMUNITY)
Admission: RE | Disposition: A | Discharge status: Home Health | Payer: Self-pay | Source: Home / Self Care | Attending: Thoracic Surgery (Cardiothoracic Vascular Surgery)

## 2020-03-23 ENCOUNTER — Other Ambulatory Visit (HOSPITAL_COMMUNITY): Payer: Self-pay

## 2020-03-23 ENCOUNTER — Inpatient Hospital Stay (HOSPITAL_COMMUNITY): Admission: RE | Admit: 2020-03-23 | Payer: 59 | Source: Home / Self Care | Admitting: Interventional Radiology

## 2020-03-23 ENCOUNTER — Inpatient Hospital Stay (HOSPITAL_COMMUNITY): Payer: 59 | Admitting: Registered Nurse

## 2020-03-23 ENCOUNTER — Inpatient Hospital Stay (HOSPITAL_COMMUNITY): Payer: 59

## 2020-03-23 HISTORY — PX: RADIOLOGY WITH ANESTHESIA: SHX6223

## 2020-03-23 HISTORY — PX: IR US GUIDANCE: IMG2393

## 2020-03-23 HISTORY — PX: IR FLUORO GUIDED NEEDLE PLC ASPIRATION/INJECTION LOC: IMG2395

## 2020-03-23 HISTORY — PX: IR LYMPHANGIOGRAM PEL/ABD BILAT: IMG5782

## 2020-03-23 LAB — BASIC METABOLIC PANEL
Anion gap: 13 (ref 5–15)
BUN: 26 mg/dL — ABNORMAL HIGH (ref 8–23)
CO2: 29 mmol/L (ref 22–32)
Calcium: 9.2 mg/dL (ref 8.9–10.3)
Chloride: 86 mmol/L — ABNORMAL LOW (ref 98–111)
Creatinine, Ser: 0.78 mg/dL (ref 0.61–1.24)
GFR, Estimated: >60 mL/min (ref >60)
Glucose, Bld: 117 mg/dL — ABNORMAL HIGH (ref 70–99)
Potassium: 4.8 mmol/L (ref 3.5–5.1)
Sodium: 128 mmol/L — ABNORMAL LOW (ref 135–145)

## 2020-03-23 LAB — CBC
HCT: 30.8 % — ABNORMAL LOW (ref 39.0–52.0)
Hemoglobin: 10.6 g/dL — ABNORMAL LOW (ref 13.0–17.0)
MCH: 31 pg (ref 26.0–34.0)
MCHC: 34.4 g/dL (ref 30.0–36.0)
MCV: 90.1 fL (ref 80.0–100.0)
Platelets: 522 10*3/uL — ABNORMAL HIGH (ref 150–400)
RBC: 3.42 MIL/uL — ABNORMAL LOW (ref 4.22–5.81)
RDW: 15.9 % — ABNORMAL HIGH (ref 11.5–15.5)
WBC: 8 10*3/uL (ref 4.0–10.5)
nRBC: 0 % (ref 0.0–0.2)

## 2020-03-23 LAB — TYPE AND SCREEN
ABO/RH(D): B NEG
Antibody Screen: NEGATIVE

## 2020-03-23 LAB — PROTIME-INR
INR: 1.2 (ref 0.8–1.2)
Prothrombin Time: 14.9 seconds (ref 11.4–15.2)

## 2020-03-23 SURGERY — IR WITH ANESTHESIA
Anesthesia: General

## 2020-03-23 MED ORDER — SUGAMMADEX SODIUM 200 MG/2ML IV SOLN
INTRAVENOUS | Status: DC | PRN
  Administered 2020-03-23: 200 mg via INTRAVENOUS

## 2020-03-23 MED ORDER — PROPOFOL 10 MG/ML IV BOLUS
INTRAVENOUS | Status: DC | PRN
  Administered 2020-03-23: 110 mg via INTRAVENOUS

## 2020-03-23 MED ORDER — MIDAZOLAM HCL 5 MG/5ML IJ SOLN
INTRAMUSCULAR | Status: DC | PRN
  Administered 2020-03-23: 2 mg via INTRAVENOUS

## 2020-03-23 MED ORDER — ROCURONIUM BROMIDE 10 MG/ML (PF) SYRINGE
PREFILLED_SYRINGE | INTRAVENOUS | Status: DC | PRN
  Administered 2020-03-23 (×2): 50 mg via INTRAVENOUS

## 2020-03-23 MED ORDER — FENTANYL CITRATE (PF) 250 MCG/5ML IJ SOLN
INTRAMUSCULAR | Status: DC | PRN
  Administered 2020-03-23 (×2): 50 ug via INTRAVENOUS

## 2020-03-23 MED ORDER — CHLORHEXIDINE GLUCONATE CLOTH 2 % EX PADS
6.0000 | MEDICATED_PAD | Freq: Every day | CUTANEOUS | Status: DC
  Administered 2020-03-25 – 2020-04-11 (×16): 6 via TOPICAL

## 2020-03-23 MED ORDER — LIDOCAINE 2% (20 MG/ML) 5 ML SYRINGE
INTRAMUSCULAR | Status: DC | PRN
  Administered 2020-03-23: 100 mg via INTRAVENOUS

## 2020-03-23 MED ORDER — MELATONIN 3 MG PO TABS
3.0000 mg | ORAL_TABLET | Freq: Every day | ORAL | Status: DC
  Administered 2020-03-23 – 2020-04-20 (×29): 3 mg
  Filled 2020-03-23 (×29): qty 1

## 2020-03-23 MED ORDER — GUAIFENESIN 100 MG/5ML PO SOLN
15.0000 mL | ORAL | Status: DC | PRN
  Administered 2020-04-12 – 2020-04-19 (×15): 300 mg
  Filled 2020-03-23 (×4): qty 15
  Filled 2020-03-23: qty 5
  Filled 2020-03-23 (×3): qty 15
  Filled 2020-03-23: qty 10
  Filled 2020-03-23 (×2): qty 15
  Filled 2020-03-23: qty 10
  Filled 2020-03-23 (×2): qty 15
  Filled 2020-03-23: qty 10
  Filled 2020-03-23: qty 15
  Filled 2020-03-23: qty 5
  Filled 2020-03-23: qty 15
  Filled 2020-03-23: qty 25

## 2020-03-23 MED ORDER — ONDANSETRON HCL 4 MG/2ML IJ SOLN
INTRAMUSCULAR | Status: DC | PRN
  Administered 2020-03-23: 4 mg via INTRAVENOUS

## 2020-03-23 MED ORDER — IOHEXOL 300 MG/ML  SOLN
50.0000 mL | Freq: Once | INTRAMUSCULAR | Status: AC | PRN
  Administered 2020-03-23: 0.5 mL via INTRA_ARTERIAL

## 2020-03-23 MED ORDER — LACTATED RINGERS IV SOLN: INTRAVENOUS | Status: DC

## 2020-03-23 MED ORDER — LACTATED RINGERS IV SOLN: INTRAVENOUS | Status: DC | PRN

## 2020-03-23 MED ORDER — DEXAMETHASONE SODIUM PHOSPHATE 10 MG/ML IJ SOLN
INTRAMUSCULAR | Status: DC | PRN
  Administered 2020-03-23: 10 mg via INTRAVENOUS

## 2020-03-23 MED ORDER — PHENYLEPHRINE HCL-NACL 10-0.9 MG/250ML-% IV SOLN
INTRAVENOUS | Status: DC | PRN
  Administered 2020-03-23: 30 ug/min via INTRAVENOUS

## 2020-03-23 MED ORDER — CEFAZOLIN SODIUM-DEXTROSE 2-4 GM/100ML-% IV SOLN
INTRAVENOUS | Status: AC
  Filled 2020-03-23: qty 100

## 2020-03-23 MED ORDER — LIPIODOL ULTRAFLUID INJECTION
INTRAMUSCULAR | Status: DC | PRN
  Administered 2020-03-23: 20 mL

## 2020-03-23 NOTE — Progress Notes (Signed)
PT Cancellation Note  Patient Details Name: Brett Dawson MRN: [id number] DOB: May 07, 1955   Cancelled Treatment:    Reason Eval/Treat Not Completed: (P) Patient at procedure or test/unavailable Pt is off floor for surgery. PT will follow back for treatment this afternoon as able.  Brett Dawson B. Migdalia Dk PT, DPT Acute Rehabilitation Services Pager [phone] Office [phone]    [place] 03/23/2020, 8:05 AM

## 2020-03-23 NOTE — Progress Notes (Signed)
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  Ongoing  GOAL:   Patient will meet greater than or equal to 90% of their needs  Met via TF  MONITOR:   PO intake, Labs, Weight trends, TF tolerance, Skin, I & O's  REASON FOR ASSESSMENT:   Consult Enteral/tube feeding initiation and management  ASSESSMENT:   64 year old male who presented on 10/21 for Ivor Lewis esophagectomy and J-tube placement. 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  10/22 - TF initiated 10/26 - swallow study, no evidence of leak, NGT removed 10/27 - MBS, diet advanced to clears, transitioned to nocturnal TF 10/31 - s/p thoracentesis with 1 L fluid removed 11/01 - chest tube placed, NPO 11/03 - left chest tube placed by IR 11/04 - clear liquids 11/05 - JP drain removed 11/18 - lymphangiogram and unsuccessful thoracic duct embolization  Plan is to start TPN today at 1800. PICC has been ordered. Discussed with CTS. Will put orders in to d/c TF at 1800. Discussed plan with RN. Pt continues on clear liquid diet.  TPN to start at 60 ml/hr which will provides 1428 kcal and 78 grams of protein daily (meeting ~50% of estimated needs).  Discussed plan with pt at bedside. Pt requests to only be sent apple juice, cranberry juice, and lemon fruit ice on meal trays. He does not like Boost Breeze supplements, broth, tea, or gelatin. He has been drinking some of his own sweet tea that was brought  in for him. Changes made in [place].  Current TF: Vivonex RTF @ 110 ml/hr with ProSource TF 45 ml BID  Admit weight: 99.6 kg Current weight:86.2 kg  Medications reviewed and include: SSI q 4 hours, protonix  Labs reviewed: sodium 125, BUN 40, elevated LFTs, hemoglobin 9.5  UOP: 1540 ml x 24 hours CT: 714 ml x 24 hours I/O's: +5.7 L since admit  Diet Order:   Diet Order            Diet clear liquid Room service appropriate? Yes; Fluid consistency: Thin  Diet effective now                 EDUCATION NEEDS:   Education needs have been addressed  Skin:  Skin Assessment: Skin Integrity Issues: Incisions: chest, abdomen, groin  Last BM:  03/22/20  Height:   Ht Readings from Last 1 Encounters:  02/24/20 5' 11" (1.803 m)    Weight:   Wt Readings from Last 1 Encounters:  03/24/20 86.2 kg    Ideal Body Weight:  78.2 kg  BMI:  Body mass index is 26.5 kg/m.  Estimated Nutritional Needs:   Kcal:  3254-9826  Protein:  140-160 grams  Fluid:  >/= 2.2 L    Gustavus Bryant, MS, RD, LDN Inpatient Clinical Dietitian Please see AMiON for contact information.

## 2020-03-23 NOTE — H&P (Signed)
Referring Physician(s): Dr. 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 Chest tube placed in IR 03/08/20  Subjective: Patient evaluated in short stay. He has noticeable increased work of breathing. He denies pain or discomfort and states he is eager to have this procedure done.   Allergies: Patient has no known allergies.  Medications: Prior to Admission medications   Medication Sig Start Date End Date Taking? Authorizing Provider  Colchicine 0.6 MG CAPS Take 0.6 mg by mouth 2 (two) times daily as needed (gout).  11/02/19  Yes [provider]  FEROSUL 325 (65 Fe) MG tablet Take 1 tablet (325 mg total) by mouth daily. Patient not taking: Reported on 02/21/2020 12/13/19   [name], MD  ondansetron (ZOFRAN) 8 MG tablet Take 1 tablet (8 mg total) by mouth 2 (two) times daily as needed for refractory nausea / vomiting. Start on day 3 after chemo. 11/27/19   [name], MD  prochlorperazine (COMPAZINE) 10 MG tablet TAKE 1 TABLET(10 MG) BY MOUTH EVERY 6 HOURS AS NEEDED FOR NAUSEA OR VOMITING Patient taking differently: Take 10 mg by mouth every 6 (six) hours as needed for nausea or vomiting.  12/13/19   [name], MD  sucralfate (CARAFATE) 1 g tablet Take 1 tablet (1 g total) by mouth 4 (four) times daily. Dissolve each tablet in 15 cc water before use. Patient taking differently: Take 1 g by mouth 4 (four) times daily as needed (throat pain). Dissolve each tablet in 15 cc water before use. 12/24/19   Kyung Rudd, MD     Vital Signs: BP 104/72 (BP Location: Right Arm)   Pulse (!) 111   Temp 98.5 F (36.9 C) (Oral)   Resp 20   Ht 5\' 11"  (1.803 m)   Wt 190 lb 0.6 oz (86.2 kg)   SpO2 92%   BMI 26.50 kg/m   Physical Exam Constitutional:      Comments: Patient kept eyes closed during most of our assessment but he answered all questions appropriately.   HENT:     Mouth/Throat:      Mouth: Mucous membranes are moist.     Pharynx: Oropharynx is clear.  Cardiovascular:     Rate and Rhythm: Regular rhythm. Tachycardia present.     Pulses: Normal pulses.     Heart sounds: Normal heart sounds.  Pulmonary:     Breath sounds: Normal breath sounds.     Comments: Increased work of breathing. Left chest tube with chylous output.  Abdominal:     General: Bowel sounds are normal.     Palpations: Abdomen is soft.     Comments: J-tube, clamped.   Skin:    General: Skin is warm and dry.  Neurological:     Mental Status: He is alert and oriented to person, place, and time.    Imaging: DG CHEST PORT 1 VIEW  Result Date: 03/22/2020 CLINICAL DATA:  64 year old male with a history chest tube EXAM: PORTABLE CHEST 1 VIEW COMPARISON:  03/19/2020 FINDINGS: Cardiomediastinal silhouette unchanged in size and contour. Patchy airspace opacities of the bilateral lungs with partial obscuration of the bilateral hemidiaphragm and the heart borders. Unchanged left thoracostomy tube. No visualized pneumothorax. IMPRESSION: Similar appearance basilar opacities likely representing a combination of atelectasis/consolidation, pleural effusion, and postoperative changes. Unchanged left thoracostomy tube with no pneumothorax Electronically Signed   By: Corrie Mckusick D.O.   On: 03/22/2020 08:22  Labs:  CBC: Recent Labs    03/16/20 0114 03/19/20 0043 03/20/20 0223 03/23/20 0446  WBC 9.6 6.8 5.6 8.0  HGB 9.6* 9.9* 10.2* 10.6*  HCT 28.9* 29.5* 30.5* 30.8*  PLT 328 372 463* 522*    COAGS: Recent Labs    09/18/19 0808 02/22/20 0830 03/08/20 0015 03/23/20 0446  INR 1.2 1.0 1.1 1.2  APTT  --  30  --   --     BMP: Recent Labs    12/27/19 0810 12/27/19 0810 01/03/20 0813 01/03/20 0813 01/11/20 0814 01/11/20 0814 01/26/20 1426 02/22/20 0830 03/19/20 0043 03/20/20 0223 03/21/20 0045 03/23/20 0446  NA 136   < > 140   < > 139   < > 140   < > 131* 128* 128* 128*  K 4.0   < >  3.9   < > 4.2   < > 4.2   < > 3.8 4.2 4.0 4.8  CL 107   < > 106   < > 105   < > 106   < > 92* 89* 85* 86*  CO2 23   < > 27   < > 29   < > 29   < > 29 30 31 29   GLUCOSE 117*   < > 127*   < > 105*   < > 99   < > 155* 149* 180* 117*  BUN 14   < > 12   < > 11   < > 10   < > 21 22 22  26*  CALCIUM 9.4   < > 9.4   < > 9.0   < > 8.8*   < > 8.3* 8.5* 8.8* 9.2  CREATININE 0.66   < > 0.63   < > 0.68   < > 0.71   < > 0.59* 0.61 0.65 0.78  GFRNONAA >60   < > >60   < > >60   < > >60   < > >60 >60 >60 >60  GFRAA >60  --  >60  --  >60  --  >60  --   --   --   --   --    < > = values in this interval not displayed.    LIVER FUNCTION TESTS: Recent Labs    01/11/20 0814 01/26/20 1426 02/22/20 0830 03/19/20 0043  BILITOT <0.2* 0.3 0.5 0.6  AST 37 29 29 96*  ALT 49* 21 19 152*  ALKPHOS 71 96 73 156*  PROT 6.4* 6.3* 6.8 7.1  ALBUMIN 3.1* 3.1* 3.7 1.8*    Assessment and Plan:  Left chylous pleural effusion: Patient is 28 days post-op from XI robotic-assisted Ivor Lewis esophagectomy, laparoscopic jejunostomy tube placement, EGD and intercostal nerve block. He developed a loculated left pleural effusion post-op requiring chest tubes. High volume output persisted and labs showed the the pleural drainage to have elevated triglycerides. The patient presents today to the [facility] Interventional Radiology department for an image-guided lymphangiogram with probable thoracic duct embolization. This procedure will be done under general anesthesia.   The patient was consented 03/21/20 and is still in agreement to proceed. He is aware of procedure benefits and risks including but not limited to infection, bleeding and damage to surrounding structures.   The consent is signed and in the chart. He has been NPO. Labs and vitals have been reviewed.     Electronically Signed: Soyla Dryer, AGACNP-BC [phone] 03/23/2020, 7:44 AM   I spent a total of 15 Minutes  at the the patient's bedside AND on the  patient's hospital floor or unit, greater than 50% of which was counseling/coordinating care for lymphangiogram with probable thoracic duct embolization.

## 2020-03-23 NOTE — Anesthesia Postprocedure Evaluation (Signed)
Anesthesia Post Note  Patient: Brett Dawson  Procedure(s) Performed: Malignant neoplasm of esophagus (N/A )     Patient location during evaluation: PACU Anesthesia Type: General Level of consciousness: awake and alert Pain management: pain level controlled Vital Signs Assessment: post-procedure vital signs reviewed and stable Respiratory status: spontaneous breathing, nonlabored ventilation, respiratory function stable and patient connected to nasal cannula oxygen Cardiovascular status: blood pressure returned to baseline and stable Postop Assessment: no apparent nausea or vomiting Anesthetic complications: no   No complications documented.  Last Vitals:  Vitals:   03/23/20 0718 03/23/20 1210  BP: 104/72 109/65  Pulse: (!) 111 100  Resp: 20 16  Temp: 36.9 C (!) 36.2 C  SpO2: 92% 90%    Last Pain:  Vitals:   03/23/20 1210  TempSrc:   PainSc: [place]

## 2020-03-23 NOTE — Anesthesia Procedure Notes (Signed)
Procedure Name: Intubation Date/Time: 03/23/2020 9:00 AM Performed by: Trinna Post., CRNA Pre-anesthesia Checklist: Patient identified, Emergency Drugs available, Suction available, Patient being monitored and Timeout performed Patient Re-evaluated:Patient Re-evaluated prior to induction Oxygen Delivery Method: Circle system utilized Preoxygenation: Pre-oxygenation with 100% oxygen Induction Type: IV induction and Cricoid Pressure applied Ventilation: Mask ventilation without difficulty Laryngoscope Size: Mac and 4 Grade View: Grade II Tube type: Oral Tube size: 7.5 mm Number of attempts: 1 Airway Equipment and Method: Stylet Placement Confirmation: ETT inserted through vocal cords under direct vision,  positive ETCO2 and breath sounds checked- equal and bilateral Secured at: 22 cm Tube secured with: Tape Dental Injury: Teeth and Oropharynx as per pre-operative assessment

## 2020-03-23 NOTE — Plan of Care (Signed)

## 2020-03-23 NOTE — Procedures (Signed)
Pre-procedure Diagnosis: Recurrent chylous effusion post esophagectomy Post-procedure Diagnosis: Same  Post technically successful lymphangiogram and thoracic duct fenestration without successful cannulation of the thoracic duct.    Complications: None Immediate EBL: None  PLAN: - Maintain low fat diet. - Continue to monitor pt's pleural out put. - If chylous effusion persists for great than 1 week, could consider repeat attempt at lymphangiogram and T-duct embolization as well as attempted cannulation via the left subclavian vein.   Signed: Sandi Mariscal Pager: [phone] 03/23/2020, 11:58 AM

## 2020-03-23 NOTE — Transfer of Care (Signed)
Immediate Anesthesia Transfer of Care Note  Patient: Brett Dawson  Procedure(s) Performed: Malignant neoplasm of esophagus (N/A )  Patient Location: PACU  Anesthesia Type:General  Level of Consciousness: drowsy, patient cooperative and Patient remains intubated per anesthesia plan  Airway & Oxygen Therapy: Patient Spontanous Breathing and Patient connected to face mask oxygen  Post-op Assessment: Report given to RN and Post -op Vital signs reviewed and stable  Post vital signs: Reviewed and stable  Last Vitals:  Vitals Value Taken Time  BP 109/65 03/23/20 1207  Temp    Pulse 100 03/23/20 1209  Resp 15 03/23/20 1209  SpO2 91 % 03/23/20 1209  Vitals shown include unvalidated device data.  Last Pain:  Vitals:   03/23/20 0718  TempSrc: Oral  PainSc: 0-No pain      Patients Stated Pain Goal: 0 (57/01/77 9390)  Complications: No complications documented.

## 2020-03-23 NOTE — Progress Notes (Addendum)
      [address]       [place] [address]             [phone]      Day of Surgery Procedure(s) (LRB): Malignant neoplasm of esophagus (N/A) Subjective: Back from having the lymphangiogram and attempted cannulation and embolization of the thoracic duct.  He is still sleepy.  No new problems.    Objective: Vital signs in last 24 hours: Temp:  [97.1 F (36.2 C)-98.7 F (37.1 C)] 98.1 F (36.7 C) (11/18 1252) Pulse Rate:  [93-111] 95 (11/18 1252) Cardiac Rhythm: Sinus bradycardia (11/18 1250) Resp:  [15-21] 15 (11/18 1252) BP: (98-113)/(62-74) 100/69 (11/18 1252) SpO2:  [87 %-99 %] 89 % (11/18 1252) Weight:  [86.2 kg] 86.2 kg (11/18 0319)     Intake/Output from previous day: 11/17 0701 - 11/18 0700 In: -  Out: 9629 [Urine:1200; Stool:1000; Chest Tube:1090] Intake/Output this shift: Total I/O In: 800 [I.V.:800] Out: 224 [Urine:150; Chest Tube:74]  General appearance: alert, cooperative and mild distress Heart: regular rate and rhythm Lungs: clear to auscultation bilaterally. The left pleural drain had about 1122ml  milky drainage past 24 hours.  Abdomen: soft, bowel sounds normal; tenderness at J-tube exit site,  dressing is dry.. Extremities: no peripheral edema Wound: port incisions have healed.   Lab Results: Recent Labs    03/23/20 0446  WBC 8.0  HGB 10.6*  HCT 30.8*  PLT 522*   BMET:  Recent Labs    03/21/20 0045 03/23/20 0446  NA 128* 128*  K 4.0 4.8  CL 85* 86*  CO2 31 29  GLUCOSE 180* 117*  BUN 22 26*  CREATININE 0.65 0.78  CALCIUM 8.8* 9.2    PT/INR:  Recent Labs    03/23/20 0446  LABPROT 14.9  INR 1.2   ABG    Component Value Date/Time   PHART 7.454 (H) 02/25/2020 0500   HCO3 26.5 02/25/2020 0500   TCO2 27 02/24/2020 1944   ACIDBASEDEF 1.0 02/24/2020 1547   O2SAT 93.7 02/25/2020 0500   CBG (last 3)  No results for input(s): GLUCAP in the last 72 hours.  Assessment/Plan: S/P Procedure(s)  (LRB): Malignant neoplasm of esophagus (N/A)    -POD-28 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 Follow up esophograms have shown no anastomotic leak.   -Chylous left pleural effusion- drainage has slowed since changing to low-fat TF. Lynmphangiogram was performed today but the thoracic duct could not be cannulated for embolization despite multiple attempts.  Will resume nutritional support with Vivonex RTF and may have clear liquid diet.  -Hyponatremia- suspect related to diuresis, does not appear volume overloaded. Lasix discontinued.   -ID-Leukocytosis resolved, no further fevers. On day 7 Augmentin after a course of Zosyn. The J-tube site is not as inflamed.  Will d/c Augmentin after today's dosing and observe temps and WBC.  -Renal- stable function.   -DVT PPX- on SQ enoxaparin daily.    [name], PA-C [phone] 03/23/2020  Agree with above.  Patient continues to have significant chest tube output despite being on medium-chain triglycerides.  It has come down somewhat but due to to inability to cannulate and embolized the thoracic duct I will switch him to TPN to allow for further healing.  PICC line will be placed hopefully today, we will start TPN this weekend.  Kristina Bertone [name]

## 2020-03-24 ENCOUNTER — Inpatient Hospital Stay: Payer: Self-pay

## 2020-03-24 ENCOUNTER — Inpatient Hospital Stay (HOSPITAL_COMMUNITY): Payer: 59

## 2020-03-24 ENCOUNTER — Encounter (HOSPITAL_COMMUNITY): Payer: Self-pay | Admitting: Interventional Radiology

## 2020-03-24 LAB — COMPREHENSIVE METABOLIC PANEL
ALT: 164 U/L — ABNORMAL HIGH (ref 0–44)
AST: 57 U/L — ABNORMAL HIGH (ref 15–41)
Albumin: 1.9 g/dL — ABNORMAL LOW (ref 3.5–5.0)
Alkaline Phosphatase: 158 U/L — ABNORMAL HIGH (ref 38–126)
Anion gap: 11 (ref 5–15)
BUN: 40 mg/dL — ABNORMAL HIGH (ref 8–23)
CO2: 29 mmol/L (ref 22–32)
Calcium: 8.6 mg/dL — ABNORMAL LOW (ref 8.9–10.3)
Chloride: 85 mmol/L — ABNORMAL LOW (ref 98–111)
Creatinine, Ser: 0.77 mg/dL (ref 0.61–1.24)
GFR, Estimated: >60 mL/min (ref >60)
Glucose, Bld: 264 mg/dL — ABNORMAL HIGH (ref 70–99)
Potassium: 4 mmol/L (ref 3.5–5.1)
Sodium: 125 mmol/L — ABNORMAL LOW (ref 135–145)
Total Bilirubin: 0.3 mg/dL (ref 0.3–1.2)
Total Protein: 7.2 g/dL (ref 6.5–8.1)

## 2020-03-24 LAB — CBC
HCT: 28.2 % — ABNORMAL LOW (ref 39.0–52.0)
Hemoglobin: 9.5 g/dL — ABNORMAL LOW (ref 13.0–17.0)
MCH: 29.9 pg (ref 26.0–34.0)
MCHC: 33.7 g/dL (ref 30.0–36.0)
MCV: 88.7 fL (ref 80.0–100.0)
Platelets: 509 10*3/uL — ABNORMAL HIGH (ref 150–400)
RBC: 3.18 MIL/uL — ABNORMAL LOW (ref 4.22–5.81)
RDW: 15.3 % (ref 11.5–15.5)
WBC: 10.2 10*3/uL (ref 4.0–10.5)
nRBC: 0 % (ref 0.0–0.2)

## 2020-03-24 LAB — GLUCOSE, CAPILLARY: Glucose-Capillary: 220 mg/dL — ABNORMAL HIGH (ref 70–99)

## 2020-03-24 MED ORDER — SODIUM CHLORIDE 0.9% FLUSH: 10.0000 mL | INTRAVENOUS | Status: DC | PRN

## 2020-03-24 MED ORDER — INSULIN ASPART 100 UNIT/ML ~~LOC~~ SOLN
0.0000 [IU] | SUBCUTANEOUS | Status: DC
  Administered 2020-03-24: 5 [IU] via SUBCUTANEOUS
  Administered 2020-03-25: 2 [IU] via SUBCUTANEOUS
  Administered 2020-03-25: 3 [IU] via SUBCUTANEOUS
  Administered 2020-03-25: 2 [IU] via SUBCUTANEOUS
  Administered 2020-03-25: 3 [IU] via SUBCUTANEOUS
  Administered 2020-03-25 (×3): 2 [IU] via SUBCUTANEOUS
  Administered 2020-03-26 – 2020-03-27 (×5): 3 [IU] via SUBCUTANEOUS
  Administered 2020-03-27: 8 [IU] via SUBCUTANEOUS
  Administered 2020-03-27 (×2): 2 [IU] via SUBCUTANEOUS
  Administered 2020-03-28: 5 [IU] via SUBCUTANEOUS
  Administered 2020-03-28: 2 [IU] via SUBCUTANEOUS
  Administered 2020-03-28 (×3): 3 [IU] via SUBCUTANEOUS
  Administered 2020-03-28: 2 [IU] via SUBCUTANEOUS
  Administered 2020-03-29: 3 [IU] via SUBCUTANEOUS
  Administered 2020-03-29: 5 [IU] via SUBCUTANEOUS

## 2020-03-24 MED ORDER — PROSOURCE TF PO LIQD
45.0000 mL | Freq: Two times a day (BID) | ORAL | Status: DC
  Administered 2020-03-24: 45 mL
  Filled 2020-03-24: qty 45

## 2020-03-24 MED ORDER — SODIUM CHLORIDE 0.9% FLUSH
10.0000 mL | Freq: Two times a day (BID) | INTRAVENOUS | Status: DC
  Administered 2020-03-24 – 2020-04-20 (×51): 10 mL

## 2020-03-24 MED ORDER — TRAVASOL 10 % IV SOLN
INTRAVENOUS | Status: AC
  Filled 2020-03-24: qty 777.6

## 2020-03-24 NOTE — Progress Notes (Signed)
Brief Nutrition Note  Spoke with RN via phone call. Pt has not yet had PICC placed. TPN scheduled to start today at 1800 and tube feeds to be d/c at 1800 today. Suspect that pt will not receive TPN today if PICC has not yet been placed.  RD will order Vivonex tube feeds to continue at this time so that pt does not go without nutrition support. Once TPN initiated, MD/PA can d/c tube feeding orders.  Continue tube feeding via J-tube: - Vivonex RTF @ 110 ml/hr (2640 ml/day) - ProSource TF 45 ml BID  Tube feeding regimen provides 2720 kcal, 154 grams of protein, and 2239 ml of H2O.   Gustavus Bryant, MS, RD, LDN Inpatient Clinical Dietitian Please see AMiON for contact information.

## 2020-03-24 NOTE — Progress Notes (Signed)
Referring Physician(s): Dr. 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 Chest tube placed in IR 03/08/20; s/p Lymphangiogram and thoracic duct fenestration without successful cannulation of the thoracic duct 03/23/20   Subjective: Patient awake and alert in bed, talking on the phone. Work of breathing significantly improved compared to yesterday pre-procedure.   Allergies: Patient has no known allergies.  Medications: Prior to Admission medications   Medication Sig Start Date End Date Taking? Authorizing Provider  Colchicine 0.6 MG CAPS Take 0.6 mg by mouth 2 (two) times daily as needed (gout).  11/02/19  Yes [provider]  FEROSUL 325 (65 Fe) MG tablet Take 1 tablet (325 mg total) by mouth daily. Patient not taking: Reported on 02/21/2020 12/13/19   [name], MD  ondansetron (ZOFRAN) 8 MG tablet Take 1 tablet (8 mg total) by mouth 2 (two) times daily as needed for refractory nausea / vomiting. Start on day 3 after chemo. 11/27/19   [name], MD  prochlorperazine (COMPAZINE) 10 MG tablet TAKE 1 TABLET(10 MG) BY MOUTH EVERY 6 HOURS AS NEEDED FOR NAUSEA OR VOMITING Patient taking differently: Take 10 mg by mouth every 6 (six) hours as needed for nausea or vomiting.  12/13/19   [name], MD  sucralfate (CARAFATE) 1 g tablet Take 1 tablet (1 g total) by mouth 4 (four) times daily. Dissolve each tablet in 15 cc water before use. Patient taking differently: Take 1 g by mouth 4 (four) times daily as needed (throat pain). Dissolve each tablet in 15 cc water before use. 12/24/19   Kyung Rudd, MD     Vital Signs: BP 114/60 (BP Location: Left Arm)   Pulse 82   Temp 98.5 F (36.9 C) (Oral)   Resp 17   Ht 5\' 11"  (1.803 m)   Wt 190 lb 0.6 oz (86.2 kg)   SpO2 95%   BMI 26.50 kg/m   Physical Exam Constitutional:      General: He is not in acute  distress. Cardiovascular:     Rate and Rhythm: Normal rate and regular rhythm.     Comments: Bilateral groin sites are clean and dry, soft and non-tender to palpation.  Pulmonary:     Effort: Pulmonary effort is normal. No respiratory distress.     Comments: Left chest tube. Dressing is clean and dry. Thin, light brown fluid in tube and pleur evac. No air leak.  Abdominal:     Palpations: Abdomen is soft.  Neurological:     Mental Status: He is alert and oriented to person, place, and time.     Imaging: IR US Guidance  Result Date: 03/23/2020 INDICATION: History of esophagectomy, now with persistent left-sided chylous effusion. Request made for lymphangiogram and thoracic duct fenestration/embolization. EXAM: 1. ULTRASOUND-GUIDED CANNULATION BILATERAL INGUINAL LYMPH NODES 2. LYMPHANGIOGRAM VERSUS BILATERAL INGUINAL ACCESS 3. FLUOROSCOPIC GUIDED FENESTRATION OF THE THORACIC DUCT BELOW THE LEVEL OF THE DIAPHRAGM COMPARISON:  Chest radiograph-03/22/2020; chest CT-03/07/2020; CT guided left-sided chest tube placement-03/08/2020 MEDICATIONS: None; the patient is admitted to the hospital and receiving antibiotics. Antibiotics were administered within an appropriate time frame prior to the initiation of the procedure. ANESTHESIA/SEDATION: General anesthesia CONTRAST:  Lipiodol 8 cc administered into the lymphatic system FLUOROSCOPY TIME:  48 minutes, 30 seconds (2,671 mGy) COMPLICATIONS: None immediate. PROCEDURE: Informed consent was obtained from the patient following explanation of the procedure, risks, benefits and alternatives. All questions were  addressed. General anesthesia was initiated by the anesthesia team. A time out was performed prior to the initiation of the procedure. Maximal barrier sterile technique utilized including caps, mask, sterile gowns, sterile gloves, large sterile drape, hand hygiene, and Betadine prep. Sonographic evaluation was performed of the right groin demonstrating 2  adjacent hypertrophied though benign appearing inguinal lymph nodes. Under direct ultrasound guidance, the hilum of the dominant right inguinal lymph node was accessed with a 25 gauge spinal needle. Lipiodol injection was then performed demonstrating slow but steady passage of contrast through the adjacent lymphatic system. Once the contrast reached the right hemipelvis the identical procedure was performed at the left groin ultimately with contrast opacification of the left inguinal/pelvic lymphatic system. Multiple ultrasound images were saved procedural documentation purposes. Next, multiple spot radiographic images were obtained of the abdomen and pelvis ultimately demonstrating faint and transient opacification of two divisions of the thoracic duct at the level of the approximate L1 vertebral body. With the gantry angled cranially and utilizing a slight lateral to medial trajectory (in hopes of avoiding the abdominal aorta), attempts were made to cannulate the dominant paired division of the thoracic duct. Ultimately, a wire was felt to be cannulate the dominant division of the thoracic duct however despite prolonged efforts, a microcatheter could not be advanced through the subcutaneous and abdominal track to the cannulation site. Three additional attempts were made to cannulate the thoracic duct however despite fluoroscopic confirmation of fenestration, the duct was unable to be successfully re-cannulated. At this point, given the length the procedure and fluoroscopic time utilized, the procedure was terminated. All needles and wires were removed from the patient. Postprocedural spot radiographic images were obtained of the abdomen and pelvis. Multiple spot fluoroscopic and radiographic images were obtained of the abdomen and pelvis. Bandages were applied. The patient tolerated the procedure well without immediate post procedural complication. FINDINGS: Sonographic evaluation demonstrates prominent though non  pathologically enlarged bilateral inguinal lymph nodes the dominant of which were successfully cannulated bilaterally with 25 gauge needles allowing for lymphangiogram. Despite prolonged efforts, the thoracic duct was unable to be cannulated though was felt to be successfully fenestrated. Postprocedural spot radiographic images demonstrate Lipiodol within the pelvic lymphatic venous system extending to the level of the upper abdomen. IMPRESSION: Technically successful image guided lymphangiogram with thoracic duct fenestration but unsuccessful fluoroscopic guided thoracic duct cannulation and embolization. PLAN: - Continue patient on low fat diet. - Continue to monitor daily output from the patient's left-sided chest tube. - If patient continues with high volume chylous output from the left-sided chest tube, could consider repeat attempt at lymphangiogram and potential thoracic duct embolization, potentially attempting access from the left subclavian vein. Above discussed with Dr. Servando Snare at the time of procedure completion. Electronically Signed   By: Sandi Mariscal M.D.   On: 03/23/2020 14:08   IR US Guidance  Result Date: 03/23/2020 INDICATION: History of esophagectomy, now with persistent left-sided chylous effusion. Request made for lymphangiogram and thoracic duct fenestration/embolization. EXAM: 1. ULTRASOUND-GUIDED CANNULATION BILATERAL INGUINAL LYMPH NODES 2. LYMPHANGIOGRAM VERSUS BILATERAL INGUINAL ACCESS 3. FLUOROSCOPIC GUIDED FENESTRATION OF THE THORACIC DUCT BELOW THE LEVEL OF THE DIAPHRAGM COMPARISON:  Chest radiograph-03/22/2020; chest CT-03/07/2020; CT guided left-sided chest tube placement-03/08/2020 MEDICATIONS: None; the patient is admitted to the hospital and receiving antibiotics. Antibiotics were administered within an appropriate time frame prior to the initiation of the procedure. ANESTHESIA/SEDATION: General anesthesia CONTRAST:  Lipiodol 8 cc administered into the lymphatic system  FLUOROSCOPY TIME:  48 minutes, 30 seconds (  0,109 mGy) COMPLICATIONS: None immediate. PROCEDURE: Informed consent was obtained from the patient following explanation of the procedure, risks, benefits and alternatives. All questions were addressed. General anesthesia was initiated by the anesthesia team. A time out was performed prior to the initiation of the procedure. Maximal barrier sterile technique utilized including caps, mask, sterile gowns, sterile gloves, large sterile drape, hand hygiene, and Betadine prep. Sonographic evaluation was performed of the right groin demonstrating 2 adjacent hypertrophied though benign appearing inguinal lymph nodes. Under direct ultrasound guidance, the hilum of the dominant right inguinal lymph node was accessed with a 25 gauge spinal needle. Lipiodol injection was then performed demonstrating slow but steady passage of contrast through the adjacent lymphatic system. Once the contrast reached the right hemipelvis the identical procedure was performed at the left groin ultimately with contrast opacification of the left inguinal/pelvic lymphatic system. Multiple ultrasound images were saved procedural documentation purposes. Next, multiple spot radiographic images were obtained of the abdomen and pelvis ultimately demonstrating faint and transient opacification of two divisions of the thoracic duct at the level of the approximate L1 vertebral body. With the gantry angled cranially and utilizing a slight lateral to medial trajectory (in hopes of avoiding the abdominal aorta), attempts were made to cannulate the dominant paired division of the thoracic duct. Ultimately, a wire was felt to be cannulate the dominant division of the thoracic duct however despite prolonged efforts, a microcatheter could not be advanced through the subcutaneous and abdominal track to the cannulation site. Three additional attempts were made to cannulate the thoracic duct however despite fluoroscopic  confirmation of fenestration, the duct was unable to be successfully re-cannulated. At this point, given the length the procedure and fluoroscopic time utilized, the procedure was terminated. All needles and wires were removed from the patient. Postprocedural spot radiographic images were obtained of the abdomen and pelvis. Multiple spot fluoroscopic and radiographic images were obtained of the abdomen and pelvis. Bandages were applied. The patient tolerated the procedure well without immediate post procedural complication. FINDINGS: Sonographic evaluation demonstrates prominent though non pathologically enlarged bilateral inguinal lymph nodes the dominant of which were successfully cannulated bilaterally with 25 gauge needles allowing for lymphangiogram. Despite prolonged efforts, the thoracic duct was unable to be cannulated though was felt to be successfully fenestrated. Postprocedural spot radiographic images demonstrate Lipiodol within the pelvic lymphatic venous system extending to the level of the upper abdomen. IMPRESSION: Technically successful image guided lymphangiogram with thoracic duct fenestration but unsuccessful fluoroscopic guided thoracic duct cannulation and embolization. PLAN: - Continue patient on low fat diet. - Continue to monitor daily output from the patient's left-sided chest tube. - If patient continues with high volume chylous output from the left-sided chest tube, could consider repeat attempt at lymphangiogram and potential thoracic duct embolization, potentially attempting access from the left subclavian vein. Above discussed with Dr. Servando Snare at the time of procedure completion. Electronically Signed   By: Sandi Mariscal M.D.   On: 03/23/2020 14:08   IR Fluoro Guide Ndl Plmt / BX  Result Date: 03/23/2020 INDICATION: History of esophagectomy, now with persistent left-sided chylous effusion. Request made for lymphangiogram and thoracic duct fenestration/embolization. EXAM: 1.  ULTRASOUND-GUIDED CANNULATION BILATERAL INGUINAL LYMPH NODES 2. LYMPHANGIOGRAM VERSUS BILATERAL INGUINAL ACCESS 3. FLUOROSCOPIC GUIDED FENESTRATION OF THE THORACIC DUCT BELOW THE LEVEL OF THE DIAPHRAGM COMPARISON:  Chest radiograph-03/22/2020; chest CT-03/07/2020; CT guided left-sided chest tube placement-03/08/2020 MEDICATIONS: None; the patient is admitted to the hospital and receiving antibiotics. Antibiotics were administered within an appropriate time  frame prior to the initiation of the procedure. ANESTHESIA/SEDATION: General anesthesia CONTRAST:  Lipiodol 8 cc administered into the lymphatic system FLUOROSCOPY TIME:  48 minutes, 30 seconds (5,053 mGy) COMPLICATIONS: None immediate. PROCEDURE: Informed consent was obtained from the patient following explanation of the procedure, risks, benefits and alternatives. All questions were addressed. General anesthesia was initiated by the anesthesia team. A time out was performed prior to the initiation of the procedure. Maximal barrier sterile technique utilized including caps, mask, sterile gowns, sterile gloves, large sterile drape, hand hygiene, and Betadine prep. Sonographic evaluation was performed of the right groin demonstrating 2 adjacent hypertrophied though benign appearing inguinal lymph nodes. Under direct ultrasound guidance, the hilum of the dominant right inguinal lymph node was accessed with a 25 gauge spinal needle. Lipiodol injection was then performed demonstrating slow but steady passage of contrast through the adjacent lymphatic system. Once the contrast reached the right hemipelvis the identical procedure was performed at the left groin ultimately with contrast opacification of the left inguinal/pelvic lymphatic system. Multiple ultrasound images were saved procedural documentation purposes. Next, multiple spot radiographic images were obtained of the abdomen and pelvis ultimately demonstrating faint and transient opacification of two divisions  of the thoracic duct at the level of the approximate L1 vertebral body. With the gantry angled cranially and utilizing a slight lateral to medial trajectory (in hopes of avoiding the abdominal aorta), attempts were made to cannulate the dominant paired division of the thoracic duct. Ultimately, a wire was felt to be cannulate the dominant division of the thoracic duct however despite prolonged efforts, a microcatheter could not be advanced through the subcutaneous and abdominal track to the cannulation site. Three additional attempts were made to cannulate the thoracic duct however despite fluoroscopic confirmation of fenestration, the duct was unable to be successfully re-cannulated. At this point, given the length the procedure and fluoroscopic time utilized, the procedure was terminated. All needles and wires were removed from the patient. Postprocedural spot radiographic images were obtained of the abdomen and pelvis. Multiple spot fluoroscopic and radiographic images were obtained of the abdomen and pelvis. Bandages were applied. The patient tolerated the procedure well without immediate post procedural complication. FINDINGS: Sonographic evaluation demonstrates prominent though non pathologically enlarged bilateral inguinal lymph nodes the dominant of which were successfully cannulated bilaterally with 25 gauge needles allowing for lymphangiogram. Despite prolonged efforts, the thoracic duct was unable to be cannulated though was felt to be successfully fenestrated. Postprocedural spot radiographic images demonstrate Lipiodol within the pelvic lymphatic venous system extending to the level of the upper abdomen. IMPRESSION: Technically successful image guided lymphangiogram with thoracic duct fenestration but unsuccessful fluoroscopic guided thoracic duct cannulation and embolization. PLAN: - Continue patient on low fat diet. - Continue to monitor daily output from the patient's left-sided chest tube. - If  patient continues with high volume chylous output from the left-sided chest tube, could consider repeat attempt at lymphangiogram and potential thoracic duct embolization, potentially attempting access from the left subclavian vein. Above discussed with Dr. Servando Snare at the time of procedure completion. Electronically Signed   By: Sandi Mariscal M.D.   On: 03/23/2020 14:08   DG Chest Port 1 View  Result Date: 03/24/2020 CLINICAL DATA:  Chest tube placement. EXAM: PORTABLE CHEST 1 VIEW COMPARISON:  03/22/2020 FINDINGS: The cardiomediastinal silhouette is unchanged. A left-sided chest tube remains in place. No pneumothorax is identified. A skin fold or other artifact overlies the right chest. Basilar predominant airspace opacities bilaterally and pleural effusions are unchanged.  IMPRESSION: Unchanged bibasilar airspace disease and pleural effusions. No pneumothorax. Electronically Signed   By: Logan Bores M.D.   On: 03/24/2020 11:54   DG CHEST PORT 1 VIEW  Result Date: 03/22/2020 CLINICAL DATA:  64 year old male with a history chest tube EXAM: PORTABLE CHEST 1 VIEW COMPARISON:  03/19/2020 FINDINGS: Cardiomediastinal silhouette unchanged in size and contour. Patchy airspace opacities of the bilateral lungs with partial obscuration of the bilateral hemidiaphragm and the heart borders. Unchanged left thoracostomy tube. No visualized pneumothorax. IMPRESSION: Similar appearance basilar opacities likely representing a combination of atelectasis/consolidation, pleural effusion, and postoperative changes. Unchanged left thoracostomy tube with no pneumothorax Electronically Signed   By: Corrie Mckusick D.O.   On: 03/22/2020 08:22   IR LYMPHANGIOGRAM PEL/ABD BILAT  Result Date: 03/23/2020 INDICATION: History of esophagectomy, now with persistent left-sided chylous effusion. Request made for lymphangiogram and thoracic duct fenestration/embolization. EXAM: 1. ULTRASOUND-GUIDED CANNULATION BILATERAL INGUINAL LYMPH  NODES 2. LYMPHANGIOGRAM VERSUS BILATERAL INGUINAL ACCESS 3. FLUOROSCOPIC GUIDED FENESTRATION OF THE THORACIC DUCT BELOW THE LEVEL OF THE DIAPHRAGM COMPARISON:  Chest radiograph-03/22/2020; chest CT-03/07/2020; CT guided left-sided chest tube placement-03/08/2020 MEDICATIONS: None; the patient is admitted to the hospital and receiving antibiotics. Antibiotics were administered within an appropriate time frame prior to the initiation of the procedure. ANESTHESIA/SEDATION: General anesthesia CONTRAST:  Lipiodol 8 cc administered into the lymphatic system FLUOROSCOPY TIME:  48 minutes, 30 seconds (3,818 mGy) COMPLICATIONS: None immediate. PROCEDURE: Informed consent was obtained from the patient following explanation of the procedure, risks, benefits and alternatives. All questions were addressed. General anesthesia was initiated by the anesthesia team. A time out was performed prior to the initiation of the procedure. Maximal barrier sterile technique utilized including caps, mask, sterile gowns, sterile gloves, large sterile drape, hand hygiene, and Betadine prep. Sonographic evaluation was performed of the right groin demonstrating 2 adjacent hypertrophied though benign appearing inguinal lymph nodes. Under direct ultrasound guidance, the hilum of the dominant right inguinal lymph node was accessed with a 25 gauge spinal needle. Lipiodol injection was then performed demonstrating slow but steady passage of contrast through the adjacent lymphatic system. Once the contrast reached the right hemipelvis the identical procedure was performed at the left groin ultimately with contrast opacification of the left inguinal/pelvic lymphatic system. Multiple ultrasound images were saved procedural documentation purposes. Next, multiple spot radiographic images were obtained of the abdomen and pelvis ultimately demonstrating faint and transient opacification of two divisions of the thoracic duct at the level of the approximate L1  vertebral body. With the gantry angled cranially and utilizing a slight lateral to medial trajectory (in hopes of avoiding the abdominal aorta), attempts were made to cannulate the dominant paired division of the thoracic duct. Ultimately, a wire was felt to be cannulate the dominant division of the thoracic duct however despite prolonged efforts, a microcatheter could not be advanced through the subcutaneous and abdominal track to the cannulation site. Three additional attempts were made to cannulate the thoracic duct however despite fluoroscopic confirmation of fenestration, the duct was unable to be successfully re-cannulated. At this point, given the length the procedure and fluoroscopic time utilized, the procedure was terminated. All needles and wires were removed from the patient. Postprocedural spot radiographic images were obtained of the abdomen and pelvis. Multiple spot fluoroscopic and radiographic images were obtained of the abdomen and pelvis. Bandages were applied. The patient tolerated the procedure well without immediate post procedural complication. FINDINGS: Sonographic evaluation demonstrates prominent though non pathologically enlarged bilateral inguinal lymph nodes the dominant  of which were successfully cannulated bilaterally with 25 gauge needles allowing for lymphangiogram. Despite prolonged efforts, the thoracic duct was unable to be cannulated though was felt to be successfully fenestrated. Postprocedural spot radiographic images demonstrate Lipiodol within the pelvic lymphatic venous system extending to the level of the upper abdomen. IMPRESSION: Technically successful image guided lymphangiogram with thoracic duct fenestration but unsuccessful fluoroscopic guided thoracic duct cannulation and embolization. PLAN: - Continue patient on low fat diet. - Continue to monitor daily output from the patient's left-sided chest tube. - If patient continues with high volume chylous output from the  left-sided chest tube, could consider repeat attempt at lymphangiogram and potential thoracic duct embolization, potentially attempting access from the left subclavian vein. Above discussed with Dr. Servando Snare at the time of procedure completion. Electronically Signed   By: Sandi Mariscal M.D.   On: 03/23/2020 14:08   [place] EKG SITE RITE  Result Date: 03/24/2020 If Site Rite image not attached, placement could not be confirmed due to current cardiac rhythm.   Labs:  CBC: Recent Labs    03/19/20 0043 03/20/20 0223 03/23/20 0446 03/24/20 0024  WBC 6.8 5.6 8.0 10.2  HGB 9.9* 10.2* 10.6* 9.5*  HCT 29.5* 30.5* 30.8* 28.2*  PLT 372 463* 522* 509*    COAGS: Recent Labs    09/18/19 0808 02/22/20 0830 03/08/20 0015 03/23/20 0446  INR 1.2 1.0 1.1 1.2  APTT  --  30  --   --     BMP: Recent Labs    12/27/19 0810 12/27/19 0810 01/03/20 0813 01/03/20 0813 01/11/20 0814 01/11/20 0814 01/26/20 1426 02/22/20 0830 03/20/20 0223 03/21/20 0045 03/23/20 0446 03/24/20 0024  NA 136   < > 140   < > 139   < > 140   < > 128* 128* 128* 125*  K 4.0   < > 3.9   < > 4.2   < > 4.2   < > 4.2 4.0 4.8 4.0  CL 107   < > 106   < > 105   < > 106   < > 89* 85* 86* 85*  CO2 23   < > 27   < > 29   < > 29   < > 30 31 29 29   GLUCOSE 117*   < > 127*   < > 105*   < > 99   < > 149* 180* 117* 264*  BUN 14   < > 12   < > 11   < > 10   < > 22 22 26* 40*  CALCIUM 9.4   < > 9.4   < > 9.0   < > 8.8*   < > 8.5* 8.8* 9.2 8.6*  CREATININE 0.66   < > 0.63   < > 0.68   < > 0.71   < > 0.61 0.65 0.78 0.77  GFRNONAA >60   < > >60   < > >60   < > >60   < > >60 >60 >60 >60  GFRAA >60  --  >60  --  >60  --  >60  --   --   --   --   --    < > = values in this interval not displayed.    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 Chest tube placed in IR 03/08/20; s/p Lymphangiogram and thoracic duct fenestration without successful cannulation of the thoracic duct 03/23/20   Patient is afebrile, 650 ml chest tube output since 0700 today. Patient states his breathing is improved compared to yesterday.   Per procedure note by Dr. Pascal Lux: If chylous effusion persists for great than 1 week, could consider repeat attempt at lymphangiogram and T-duct embolization as well as attempted cannulation via the left subclavian vein.   IR will continue to follow. Other plans per primary teams.   Electronically Signed: Soyla Dryer, AGACNP-BC [phone] 03/24/2020, 2:00 PM   I spent a total of 15 Minutes at the the patient's bedside AND on the patient's hospital floor or unit, greater than 50% of which was counseling/coordinating care for left chest tube, post-lymphangiogram with thoracic duct fenestration care

## 2020-03-24 NOTE — Progress Notes (Signed)
Peripherally Inserted Central Catheter Placement  The IV Nurse has discussed with the patient and/or persons authorized to consent for the patient, the purpose of this procedure and the potential benefits and risks involved with this procedure.  The benefits include less needle sticks, lab draws from the catheter, and the patient may be discharged home with the catheter. Risks include, but not limited to, infection, bleeding, blood clot (thrombus formation), and puncture of an artery; nerve damage and irregular heartbeat and possibility to perform a PICC exchange if needed/ordered by physician.  Alternatives to this procedure were also discussed.  Bard Power PICC patient education guide, fact sheet on infection prevention and patient information card has been provided to patient /or left at bedside.    PICC Placement Documentation  PICC Double Lumen 27/61/47 PICC Right Basilic 40 cm 0 cm (Active)  Indication for Insertion or Continuance of Line Administration of hyperosmolar/irritating solutions (i.e. TPN, Vancomycin, etc.) 03/24/20 1841  Exposed Catheter (cm) 0 cm 03/24/20 1841  Site Assessment Clean;Dry;Intact 03/24/20 1841  Lumen #1 Status Flushed;Blood return noted;Saline locked 03/24/20 1841  Lumen #2 Status Flushed;Blood return noted;Saline locked 03/24/20 1841  Dressing Type Transparent 03/24/20 1841  Dressing Status Dry;Clean;Intact 03/24/20 1841  Dressing Change Due 03/31/20 03/24/20 1841       [name] 03/24/2020, 6:43 PM

## 2020-03-24 NOTE — Progress Notes (Signed)
PT Cancellation Note  Patient Details Name: Brett Dawson MRN: [id number] DOB: [birth year]   Cancelled Treatment:    Reason Eval/Treat Not Completed: Patient declined, no reason specified Pt declines working with therapy today despite setting up a specific time earlier in the morning by patient request. "I have too much on my mind right now and just don't want to do nothing" Explained importance of taking little steps at a time and just doing what he can tolerate however pt not willing. Will follow for pt's willingness to participate.    Marguarite Arbour A Maicee Ullman 03/24/2020, 12:05 PM [name], PT, DPT Acute Rehabilitation Services Pager [phone] Office [phone]

## 2020-03-24 NOTE — Plan of Care (Signed)

## 2020-03-24 NOTE — Progress Notes (Signed)
PHARMACY - TOTAL PARENTERAL NUTRITION CONSULT NOTE  Indication:  Chylothorax  Patient Measurements: Height: 5\' 11"  (180.3 cm) Weight: 86.2 kg (190 lb 0.6 oz) IBW/kg (Calculated) : 75.3 TPN AdjBW (KG): 81.4 Body mass index is 26.5 kg/m. Usual Weight: 118-122 kg prior to cancer dx, now at 86.2 kg  Assessment:  9 YOM with esophageal cancer presented on 02/24/20 for esophagectomy and J-tube placement.  Patient was started on TF post-op and was briefly trialed on a clear liquid diet.  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  Patient underwent lymphangiogram and thoracic duct fenestration on 03/23/20 - unable to cannulate the thoracic duct.  Pharmacy consulted to manage TPN to allow for healing as low-fat, MCT TF formula has not been successful.  Glucose / Insulin: no hx DM - AM glucose elevated (up to 264) Electrolytes: low Na/CL, others WNL Renal: SCr 0.77 stable, BUN 40 (off Lasix 40mg  IV BID on 11/17) LFTs / TGs: LFTs mildly elevated, tbili WNL Prealbumin / albumin: albumin 1.9 Intake / Output; MIVF: UOP 0.7 ml/kg/hr, stool 1062mL, chest tube 767mL, net -6.5L GI Imaging: none since TPN  Surgeries / Procedures: none since TPN  Central access: PICC ordered 03/24/20 TPN start date: 03/24/20  Nutritional Goals (per RD rec 11/15): 3291-9166 kCal and 140-160gm protein per day  Current Nutrition:  Clear liquid diet To come off of TF  Plan:  Start TPN at 60 ml/hr at 1800 (goal rate 115 ml/hr) TPN will provide 78g AA, 202g CHO and 43g ILE for a total of 1428 kCal, meeting ~50% of patient needs Electrolytes in TPN: Na 25mEq/L, K 2mEq/L, Ca 30mEq/L, Mag 30mEq/L, Phos 25mmol/L, Cl:Ac 2:1 for now Add standard MVI and trace elements to TPN Initiate moderate SSI Q4H Standard TPN labs and nursing care orders, CBGs  Ahmere Hemenway D. Mina Marble, PharmD, BCPS, [place] 03/24/2020, 11:44 AM

## 2020-03-25 LAB — CBC
HCT: 30.2 % — ABNORMAL LOW (ref 39.0–52.0)
Hemoglobin: 9.9 g/dL — ABNORMAL LOW (ref 13.0–17.0)
MCH: 29.1 pg (ref 26.0–34.0)
MCHC: 32.8 g/dL (ref 30.0–36.0)
MCV: 88.8 fL (ref 80.0–100.0)
Platelets: 543 10*3/uL — ABNORMAL HIGH (ref 150–400)
RBC: 3.4 MIL/uL — ABNORMAL LOW (ref 4.22–5.81)
RDW: 15.3 % (ref 11.5–15.5)
WBC: 12.7 10*3/uL — ABNORMAL HIGH (ref 4.0–10.5)
nRBC: 0 % (ref 0.0–0.2)

## 2020-03-25 LAB — BASIC METABOLIC PANEL
Anion gap: 10 (ref 5–15)
BUN: 26 mg/dL — ABNORMAL HIGH (ref 8–23)
CO2: 31 mmol/L (ref 22–32)
Calcium: 9.1 mg/dL (ref 8.9–10.3)
Chloride: 88 mmol/L — ABNORMAL LOW (ref 98–111)
Creatinine, Ser: 0.63 mg/dL (ref 0.61–1.24)
GFR, Estimated: >60 mL/min (ref >60)
Glucose, Bld: 133 mg/dL — ABNORMAL HIGH (ref 70–99)
Potassium: 4.3 mmol/L (ref 3.5–5.1)
Sodium: 129 mmol/L — ABNORMAL LOW (ref 135–145)

## 2020-03-25 LAB — DIFFERENTIAL
Abs Immature Granulocytes: 0.53 10*3/uL — ABNORMAL HIGH (ref 0.00–0.07)
Basophils Absolute: 0 10*3/uL (ref 0.0–0.1)
Basophils Relative: 0 %
Eosinophils Absolute: 0.1 10*3/uL (ref 0.0–0.5)
Eosinophils Relative: 1 %
Immature Granulocytes: 4 %
Lymphocytes Relative: 4 %
Lymphs Abs: 0.4 10*3/uL — ABNORMAL LOW (ref 0.7–4.0)
Monocytes Absolute: 1.4 10*3/uL — ABNORMAL HIGH (ref 0.1–1.0)
Monocytes Relative: 11 %
Neutro Abs: 10.2 10*3/uL — ABNORMAL HIGH (ref 1.7–7.7)
Neutrophils Relative %: 80 %
WBC Morphology: INCREASED

## 2020-03-25 LAB — GLUCOSE, CAPILLARY
Glucose-Capillary: 127 mg/dL — ABNORMAL HIGH (ref 70–99)
Glucose-Capillary: 135 mg/dL — ABNORMAL HIGH (ref 70–99)
Glucose-Capillary: 136 mg/dL — ABNORMAL HIGH (ref 70–99)
Glucose-Capillary: 142 mg/dL — ABNORMAL HIGH (ref 70–99)
Glucose-Capillary: 147 mg/dL — ABNORMAL HIGH (ref 70–99)
Glucose-Capillary: 157 mg/dL — ABNORMAL HIGH (ref 70–99)
Glucose-Capillary: 171 mg/dL — ABNORMAL HIGH (ref 70–99)

## 2020-03-25 LAB — PREALBUMIN: Prealbumin: 14.7 mg/dL — ABNORMAL LOW (ref 18–38)

## 2020-03-25 LAB — TRIGLYCERIDES: Triglycerides: 92 mg/dL (ref <150)

## 2020-03-25 LAB — MAGNESIUM: Magnesium: 1.8 mg/dL (ref 1.7–2.4)

## 2020-03-25 LAB — PHOSPHORUS: Phosphorus: 3.5 mg/dL (ref 2.5–4.6)

## 2020-03-25 MED ORDER — TRAVASOL 10 % IV SOLN
INTRAVENOUS | Status: AC
  Filled 2020-03-25: qty 1504.8

## 2020-03-25 NOTE — Plan of Care (Signed)

## 2020-03-25 NOTE — Progress Notes (Signed)
      [address]       [place] [address]             [phone]      2 Days Post-Op Procedure(s) (LRB): Malignant neoplasm of esophagus (N/A) Subjective: No events PICC line placed, started on TPN    Objective: Vital signs in last 24 hours: Temp:  [98.2 F (36.8 C)-98.6 F (37 C)] 98.2 F (36.8 C) (11/20 0808) Pulse Rate:  [90-97] 90 (11/20 0808) Cardiac Rhythm: Normal sinus rhythm (11/20 0714) Resp:  [15-19] 19 (11/20 0808) BP: (104-112)/(72) 112/72 (11/20 0808) SpO2:  [95 %-99 %] 99 % (11/20 0808) Weight:  [85.3 kg] 85.3 kg (11/20 0355)     Intake/Output from previous day: 11/19 0701 - 11/20 0700 In: 526.2 [I.V.:526.2] Out: 2750 [Urine:1700; Chest Tube:1050] Intake/Output this shift: Total I/O In: 490 [P.O.:240] Out: 300 [Urine:300]  General appearance: alert, cooperative and mild distress Heart: regular rate and rhythm Lungs: clear to auscultation bilaterally. The left pleural drain had about 1059ml  milky drainage past 24 hours.  Abdomen: soft, bowel sounds normal; tenderness at J-tube exit site,  dressing is dry.. Extremities: no peripheral edema Wound: port incisions have healed.   Lab Results: Recent Labs    03/24/20 0024 03/25/20 0430  WBC 10.2 12.7*  HGB 9.5* 9.9*  HCT 28.2* 30.2*  PLT 509* 543*   BMET:  Recent Labs    03/24/20 0024 03/25/20 0430  NA 125* 129*  K 4.0 4.3  CL 85* 88*  CO2 29 31  GLUCOSE 264* 133*  BUN 40* 26*  CREATININE 0.77 0.63  CALCIUM 8.6* 9.1    PT/INR:  Recent Labs    03/23/20 0446  LABPROT 14.9  INR 1.2   ABG    Component Value Date/Time   PHART 7.454 (H) 02/25/2020 0500   HCO3 26.5 02/25/2020 0500   TCO2 27 02/24/2020 1944   ACIDBASEDEF 1.0 02/24/2020 1547   O2SAT 93.7 02/25/2020 0500   CBG (last 3)  Recent Labs    03/25/20 0001 03/25/20 0354 03/25/20 0806  GLUCAP 127* 136* 147*    Assessment/Plan: S/P Procedure(s) (LRB): Malignant neoplasm of esophagus (N/A)     -POD-29 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 Follow up esophograms have shown no anastomotic leak.   -Chylous left pleural effusion- Transitioned to TPN.  Will continue for 1 week, and reassess.  Will also speak with radiology to attempt another lymphogram  -Hyponatremia- suspect related to diuresis, does not appear volume overloaded. Lasix discontinued.   -ID-Leukocytosis resolved, no further fevers. On day 7 Augmentin after a course of Zosyn. The J-tube site is not as inflamed.  Will d/c Augmentin after today's dosing and observe temps and WBC.  -Renal- stable function.   -DVT PPX- on SQ enoxaparin daily.    Lajuana Matte, PA-C

## 2020-03-25 NOTE — Progress Notes (Signed)
PHARMACY - TOTAL PARENTERAL NUTRITION CONSULT NOTE  Indication:  Chylothorax  Patient Measurements: Height: 5\' 11"  (180.3 cm) Weight: 85.3 kg (188 lb 0.8 oz) IBW/kg (Calculated) : 75.3 TPN AdjBW (KG): 81.4 Body mass index is 26.23 kg/m. Usual Weight: 118-122 kg prior to cancer dx, now at 86.2 kg  Assessment:  51 YOM with esophageal cancer presented on 02/24/20 for esophagectomy and J-tube placement.  Patient was started on TF post-op and was briefly trialed on a clear liquid diet.  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  Patient underwent lymphangiogram and thoracic duct fenestration on 03/23/20 - unable to cannulate the thoracic duct.  Pharmacy consulted to manage TPN to allow for healing as low-fat, MCT TF formula has not been successful.  Glucose / Insulin: no hx DM - CBGs mostly controlled.  Required 9 units SSI since TPN started. Electrolytes: Na/CL improving, CoCa elevated at 10.78 (Ca x Phos = 38, goal < 55), Mag low normal Renal: SCr < 1 stable, BUN down to 26 (off Lasix 40mg  IV BID on 11/17) LFTs / TGs: LFTs mildly elevated, tbili / TG WNL Prealbumin / albumin: baseline prealbumin 14.7, albumin 1.9 Intake / Output; MIVF: UOP 0.8 ml/kg/hr, chest tube 1067mL, net -10L GI Imaging: none since TPN  Surgeries / Procedures: none since TPN  Central access: PICC placed 03/24/20 TPN start date: 03/24/20  Nutritional Goals (per RD rec 11/15): 1245-8099 kCal and 140-160gm protein per day  Current Nutrition:  Clear liquid diet TPN  Plan:  Increase TPN to goal rate of 110 ml/hr, providing 150g AA, 383g CHO and 79g ILE for a total of 2695 kCal, meeting 100% of patient needs Electrolytes in TPN: Na 27mEq/L, K 98mEq/L, decr Ca 1.22mEq/L, Mag 39mEq/L, Phos 5mmol/L, max CL Add standard MVI and trace elements to TPN Continue moderate SSI Q4H - D/C if CBGs controlled at goal TPN rate F/U AM labs, CBGs,  I/O's  D/C [employer]  Orvie Caradine D. Mina Marble, PharmD, BCPS, [place] 03/25/2020, 9:09 AM

## 2020-03-26 LAB — BASIC METABOLIC PANEL
Anion gap: 7 (ref 5–15)
BUN: 17 mg/dL (ref 8–23)
CO2: 30 mmol/L (ref 22–32)
Calcium: 8.8 mg/dL — ABNORMAL LOW (ref 8.9–10.3)
Chloride: 92 mmol/L — ABNORMAL LOW (ref 98–111)
Creatinine, Ser: 0.49 mg/dL — ABNORMAL LOW (ref 0.61–1.24)
GFR, Estimated: >60 mL/min (ref >60)
Glucose, Bld: 159 mg/dL — ABNORMAL HIGH (ref 70–99)
Potassium: 4.5 mmol/L (ref 3.5–5.1)
Sodium: 129 mmol/L — ABNORMAL LOW (ref 135–145)

## 2020-03-26 LAB — GLUCOSE, CAPILLARY
Glucose-Capillary: 141 mg/dL — ABNORMAL HIGH (ref 70–99)
Glucose-Capillary: 157 mg/dL — ABNORMAL HIGH (ref 70–99)
Glucose-Capillary: 164 mg/dL — ABNORMAL HIGH (ref 70–99)
Glucose-Capillary: 192 mg/dL — ABNORMAL HIGH (ref 70–99)
Glucose-Capillary: 252 mg/dL — ABNORMAL HIGH (ref 70–99)

## 2020-03-26 LAB — MAGNESIUM: Magnesium: 1.8 mg/dL (ref 1.7–2.4)

## 2020-03-26 LAB — PHOSPHORUS: Phosphorus: 3.3 mg/dL (ref 2.5–4.6)

## 2020-03-26 MED ORDER — TRAVASOL 10 % IV SOLN
INTRAVENOUS | Status: AC
  Filled 2020-03-26: qty 1504.8

## 2020-03-26 NOTE — Plan of Care (Signed)

## 2020-03-26 NOTE — Progress Notes (Signed)
PHARMACY - TOTAL PARENTERAL NUTRITION CONSULT NOTE  Indication:  Chylothorax  Patient Measurements: Height: 5\' 11"  (180.3 cm) Weight: 85.3 kg (188 lb 0.8 oz) IBW/kg (Calculated) : 75.3 TPN AdjBW (KG): 81.4 Body mass index is 26.23 kg/m. Usual Weight: 118-122 kg prior to cancer dx, now at 86.2 kg  Assessment:  69 YOM with esophageal cancer presented on 02/24/20 for esophagectomy and J-tube placement.  Patient was started on TF post-op and was briefly trialed on a clear liquid diet.  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  Patient underwent lymphangiogram and thoracic duct fenestration on 03/23/20 - unable to cannulate the thoracic duct.  Pharmacy consulted to manage TPN to allow for healing as low-fat, MCT TF formula has not been successful.  Glucose / Insulin: no hx DM - CBGs controlled.  Required 16 units SSI yesterday. Electrolytes: Na/CL improving, CoCa down to 10.48, Mag low normal, K trending up and Phos trending down Renal: SCr < 1 stable, BUN down to 26 (off Lasix 40mg  IV BID on 11/17) LFTs / TGs: LFTs mildly elevated, tbili / TG WNL Prealbumin / albumin: baseline prealbumin 14.7, albumin 1.9, LBM 11/20 Intake / Output; MIVF: UOP 0.8 ml/kg/hr, chest tube 952mL, net -13L GI Imaging: none since TPN  Surgeries / Procedures: none since TPN  Central access: PICC placed 03/24/20 TPN start date: 03/24/20  Nutritional Goals (per RD rec 11/15): 3329-5188 kCal and 140-160gm protein per day  Current Nutrition:  Clear liquid diet TPN  Plan:  Continue TPN at goal rate of 110 ml/hr, providing 150g AA, 383g CHO and 79g ILE for a total of 2695 kCal, meeting 100% of patient needs Electrolytes in TPN: incr Na 16mEq/L, decr K slightly to 107mEq/L, Ca 1.9mEq/L -- decreased 11/20, incr Mag slightly to 80mEq/L since just increased 11/20, incr Phos slightly to 66mmol/L, max CL Add standard MVI and trace elements to  TPN Continue moderate SSI Q4H Standard TPN labs on Mon/Thurs, monitor chest tube output and I/O's  Briggs Edelen D. Mina Marble, PharmD, BCPS, [place] 03/26/2020, 7:12 AM

## 2020-03-26 NOTE — Progress Notes (Signed)
      [address]       [place] [address]             [phone]      3 Days Post-Op Procedure(s) (LRB): Malignant neoplasm of esophagus (N/A) Subjective: No events PICC line placed, started on TPN    Objective: Vital signs in last 24 hours: Temp:  [98.2 F (36.8 C)-99.3 F (37.4 C)] 98.7 F (37.1 C) (11/21 1100) Pulse Rate:  [87-98] 97 (11/21 1100) Cardiac Rhythm: Normal sinus rhythm (11/21 0712) Resp:  [14-18] 15 (11/21 1100) BP: (100-117)/(62-79) 100/62 (11/21 1100) SpO2:  [94 %-97 %] 95 % (11/21 1100)     Intake/Output from previous day: 11/20 0701 - 11/21 0700 In: 2634.5 [P.O.:720; I.V.:1914.5] Out: 2600 [Urine:1650; Chest Tube:950] Intake/Output this shift: No intake/output data recorded.  General appearance: alert, cooperative and mild distress Heart: regular rate and rhythm Lungs: clear to auscultation bilaterally. The left pleural drain had about 1035ml  milky drainage past 24 hours.  Abdomen: soft, bowel sounds normal; tenderness at J-tube exit site,  dressing is dry.. Extremities: no peripheral edema Wound: port incisions have healed.   Lab Results: Recent Labs    03/24/20 0024 03/25/20 0430  WBC 10.2 12.7*  HGB 9.5* 9.9*  HCT 28.2* 30.2*  PLT 509* 543*   BMET:  Recent Labs    03/25/20 0430 03/26/20 0358  NA 129* 129*  K 4.3 4.5  CL 88* 92*  CO2 31 30  GLUCOSE 133* 159*  BUN 26* 17  CREATININE 0.63 0.49*  CALCIUM 9.1 8.8*    PT/INR:  No results for input(s): LABPROT, INR in the last 72 hours. ABG    Component Value Date/Time   PHART 7.454 (H) 02/25/2020 0500   HCO3 26.5 02/25/2020 0500   TCO2 27 02/24/2020 1944   ACIDBASEDEF 1.0 02/24/2020 1547   O2SAT 93.7 02/25/2020 0500   CBG (last 3)  Recent Labs    03/25/20 2340 03/26/20 0338 03/26/20 0752  GLUCAP 171* 157* 164*    Assessment/Plan: S/P Procedure(s) (LRB): Malignant neoplasm of esophagus (N/A)    -POD-29 robotic assisted ivor-Lewis  esophagectomy for distal esophageal adenocarcinoma. Follow up esophograms have shown no anastomotic leak.   -Chylous left pleural effusion- Transitioned to TPN.  Will continue for 1 week, and reassess.  Will also speak with radiology to attempt another lymphogram  -Hyponatremia- suspect related to diuresis, does not appear volume overloaded. Lasix discontinued.   -ID-Leukocytosis resolved, no further fevers. -Renal- stable function.   -DVT PPX- on SQ enoxaparin daily.    Lajuana Matte, PA-C

## 2020-03-27 LAB — GLUCOSE, CAPILLARY
Glucose-Capillary: 152 mg/dL — ABNORMAL HIGH (ref 70–99)
Glucose-Capillary: 151 mg/dL — ABNORMAL HIGH (ref 70–99)
Glucose-Capillary: 149 mg/dL — ABNORMAL HIGH (ref 70–99)
Glucose-Capillary: 163 mg/dL — ABNORMAL HIGH (ref 70–99)
Glucose-Capillary: 150 mg/dL — ABNORMAL HIGH (ref 70–99)
Glucose-Capillary: 130 mg/dL — ABNORMAL HIGH (ref 70–99)

## 2020-03-27 LAB — COMPREHENSIVE METABOLIC PANEL
ALT: 58 U/L — ABNORMAL HIGH (ref 0–44)
AST: 22 U/L (ref 15–41)
Albumin: 1.9 g/dL — ABNORMAL LOW (ref 3.5–5.0)
Alkaline Phosphatase: 120 U/L (ref 38–126)
Anion gap: 7 (ref 5–15)
BUN: 15 mg/dL (ref 8–23)
CO2: 27 mmol/L (ref 22–32)
Calcium: 8.6 mg/dL — ABNORMAL LOW (ref 8.9–10.3)
Chloride: 95 mmol/L — ABNORMAL LOW (ref 98–111)
Creatinine, Ser: 0.49 mg/dL — ABNORMAL LOW (ref 0.61–1.24)
GFR, Estimated: >60 mL/min (ref >60)
Glucose, Bld: 139 mg/dL — ABNORMAL HIGH (ref 70–99)
Potassium: 4.5 mmol/L (ref 3.5–5.1)
Sodium: 129 mmol/L — ABNORMAL LOW (ref 135–145)
Total Bilirubin: 0.5 mg/dL (ref 0.3–1.2)
Total Protein: 6.3 g/dL — ABNORMAL LOW (ref 6.5–8.1)

## 2020-03-27 LAB — DIFFERENTIAL
Abs Immature Granulocytes: 0 10*3/uL (ref 0.00–0.07)
Basophils Absolute: 0 10*3/uL (ref 0.0–0.1)
Basophils Relative: 0 %
Eosinophils Absolute: 0.1 10*3/uL (ref 0.0–0.5)
Eosinophils Relative: 1 %
Lymphocytes Relative: 4 %
Lymphs Abs: 0.4 10*3/uL — ABNORMAL LOW (ref 0.7–4.0)
Monocytes Absolute: 1.1 10*3/uL — ABNORMAL HIGH (ref 0.1–1.0)
Monocytes Relative: 11 %
Neutro Abs: 8.5 10*3/uL — ABNORMAL HIGH (ref 1.7–7.7)
Neutrophils Relative %: 84 %
nRBC: 0 /100 WBC

## 2020-03-27 LAB — MAGNESIUM: Magnesium: 1.8 mg/dL (ref 1.7–2.4)

## 2020-03-27 LAB — CBC
HCT: 28.7 % — ABNORMAL LOW (ref 39.0–52.0)
Hemoglobin: 9.2 g/dL — ABNORMAL LOW (ref 13.0–17.0)
MCH: 29.6 pg (ref 26.0–34.0)
MCHC: 32.1 g/dL (ref 30.0–36.0)
MCV: 92.3 fL (ref 80.0–100.0)
Platelets: 419 10*3/uL — ABNORMAL HIGH (ref 150–400)
RBC: 3.11 MIL/uL — ABNORMAL LOW (ref 4.22–5.81)
RDW: 15.6 % — ABNORMAL HIGH (ref 11.5–15.5)
WBC: 10.1 10*3/uL (ref 4.0–10.5)
nRBC: 0 % (ref 0.0–0.2)

## 2020-03-27 LAB — TRIGLYCERIDES: Triglycerides: 55 mg/dL (ref <150)

## 2020-03-27 LAB — PREALBUMIN: Prealbumin: 15.4 mg/dL — ABNORMAL LOW (ref 18–38)

## 2020-03-27 LAB — PHOSPHORUS: Phosphorus: 3.2 mg/dL (ref 2.5–4.6)

## 2020-03-27 MED ORDER — TRAVASOL 10 % IV SOLN
INTRAVENOUS | Status: AC
  Filled 2020-03-27: qty 1504.8

## 2020-03-27 NOTE — Progress Notes (Signed)
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 Follow up esophograms have shown no anastomotic leak.  Chylous effusion post op IR attempt at thoracic duct embolization 11/18 Unsuccessful to cannulate thoracic duct - Continue to monitor pt's pleural out put. - If chylous effusion persists for great than 1 week, could consider repeat attempt at lymphangiogram and T-duct embolization as well as attempted cannulation via the left subclavian vein.   OP still significant 1200 in 24 hrs   Allergies: Patient has no known allergies.  Medications: Prior to Admission medications   Medication Sig Start Date End Date Taking? Authorizing Provider  Colchicine 0.6 MG CAPS Take 0.6 mg by mouth 2 (two) times daily as needed (gout).  11/02/19  Yes [provider]  FEROSUL 325 (65 Fe) MG tablet Take 1 tablet (325 mg total) by mouth daily. Patient not taking: Reported on 02/21/2020 12/13/19   [name], MD  ondansetron (ZOFRAN) 8 MG tablet Take 1 tablet (8 mg total) by mouth 2 (two) times daily as needed for refractory nausea / vomiting. Start on day 3 after chemo. 11/27/19   [name], MD  prochlorperazine (COMPAZINE) 10 MG tablet TAKE 1 TABLET(10 MG) BY MOUTH EVERY 6 HOURS AS NEEDED FOR NAUSEA OR VOMITING Patient taking differently: Take 10 mg by mouth every 6 (six) hours as needed for nausea or vomiting.  12/13/19   [name], MD  sucralfate (CARAFATE) 1 g tablet Take 1 tablet (1 g total) by mouth 4 (four) times daily. Dissolve each tablet in 15 cc water before use. Patient taking differently: Take 1 g by mouth 4 (four) times daily as needed (throat pain). Dissolve each tablet in 15 cc water before use. 12/24/19   Kyung Rudd, MD     Vital Signs: BP 110/72 (BP Location: Left  Arm)   Pulse 94   Temp 98.8 F (37.1 C) (Oral)   Resp 18   Ht 5\' 11"  (1.803 m)   Wt 184 lb 15.5 oz (83.9 kg)   SpO2 94%   BMI 25.80 kg/m   Physical Exam Vitals reviewed.  Skin:    General: Skin is warm.     Comments: Chest tube intact No air leak Chylous OP in vac 300 since last night - early am 1200 in 24 hrs  Neurological:     Mental Status: He is alert.     Imaging: IR US Guidance  Result Date: 03/23/2020 INDICATION: History of esophagectomy, now with persistent left-sided chylous effusion. Request made for lymphangiogram and thoracic duct fenestration/embolization. EXAM: 1. ULTRASOUND-GUIDED CANNULATION BILATERAL INGUINAL LYMPH NODES 2. LYMPHANGIOGRAM VERSUS BILATERAL INGUINAL ACCESS 3. FLUOROSCOPIC GUIDED FENESTRATION OF THE THORACIC DUCT BELOW THE LEVEL OF THE DIAPHRAGM COMPARISON:  Chest radiograph-03/22/2020; chest CT-03/07/2020; CT guided left-sided chest tube placement-03/08/2020 MEDICATIONS: None; the patient is admitted to the hospital and receiving antibiotics. Antibiotics were administered within an appropriate time frame prior to the initiation of the procedure. ANESTHESIA/SEDATION: General anesthesia CONTRAST:  Lipiodol 8 cc administered into the lymphatic system FLUOROSCOPY TIME:  48 minutes, 30 seconds (5,732 mGy) COMPLICATIONS: None immediate. PROCEDURE: Informed consent was obtained from the patient following explanation of the procedure, risks, benefits and alternatives. All questions were addressed. General anesthesia was initiated by the anesthesia team. A time out was performed prior to the initiation of the procedure. Maximal barrier sterile technique  utilized including caps, mask, sterile gowns, sterile gloves, large sterile drape, hand hygiene, and Betadine prep. Sonographic evaluation was performed of the right groin demonstrating 2 adjacent hypertrophied though benign appearing inguinal lymph nodes. Under direct ultrasound guidance, the hilum of the dominant  right inguinal lymph node was accessed with a 25 gauge spinal needle. Lipiodol injection was then performed demonstrating slow but steady passage of contrast through the adjacent lymphatic system. Once the contrast reached the right hemipelvis the identical procedure was performed at the left groin ultimately with contrast opacification of the left inguinal/pelvic lymphatic system. Multiple ultrasound images were saved procedural documentation purposes. Next, multiple spot radiographic images were obtained of the abdomen and pelvis ultimately demonstrating faint and transient opacification of two divisions of the thoracic duct at the level of the approximate L1 vertebral body. With the gantry angled cranially and utilizing a slight lateral to medial trajectory (in hopes of avoiding the abdominal aorta), attempts were made to cannulate the dominant paired division of the thoracic duct. Ultimately, a wire was felt to be cannulate the dominant division of the thoracic duct however despite prolonged efforts, a microcatheter could not be advanced through the subcutaneous and abdominal track to the cannulation site. Three additional attempts were made to cannulate the thoracic duct however despite fluoroscopic confirmation of fenestration, the duct was unable to be successfully re-cannulated. At this point, given the length the procedure and fluoroscopic time utilized, the procedure was terminated. All needles and wires were removed from the patient. Postprocedural spot radiographic images were obtained of the abdomen and pelvis. Multiple spot fluoroscopic and radiographic images were obtained of the abdomen and pelvis. Bandages were applied. The patient tolerated the procedure well without immediate post procedural complication. FINDINGS: Sonographic evaluation demonstrates prominent though non pathologically enlarged bilateral inguinal lymph nodes the dominant of which were successfully cannulated bilaterally with 25  gauge needles allowing for lymphangiogram. Despite prolonged efforts, the thoracic duct was unable to be cannulated though was felt to be successfully fenestrated. Postprocedural spot radiographic images demonstrate Lipiodol within the pelvic lymphatic venous system extending to the level of the upper abdomen. IMPRESSION: Technically successful image guided lymphangiogram with thoracic duct fenestration but unsuccessful fluoroscopic guided thoracic duct cannulation and embolization. PLAN: - Continue patient on low fat diet. - Continue to monitor daily output from the patient's left-sided chest tube. - If patient continues with high volume chylous output from the left-sided chest tube, could consider repeat attempt at lymphangiogram and potential thoracic duct embolization, potentially attempting access from the left subclavian vein. Above discussed with Dr. Servando Snare at the time of procedure completion. Electronically Signed   By: Sandi Mariscal M.D.   On: 03/23/2020 14:08   IR US Guidance  Result Date: 03/23/2020 INDICATION: History of esophagectomy, now with persistent left-sided chylous effusion. Request made for lymphangiogram and thoracic duct fenestration/embolization. EXAM: 1. ULTRASOUND-GUIDED CANNULATION BILATERAL INGUINAL LYMPH NODES 2. LYMPHANGIOGRAM VERSUS BILATERAL INGUINAL ACCESS 3. FLUOROSCOPIC GUIDED FENESTRATION OF THE THORACIC DUCT BELOW THE LEVEL OF THE DIAPHRAGM COMPARISON:  Chest radiograph-03/22/2020; chest CT-03/07/2020; CT guided left-sided chest tube placement-03/08/2020 MEDICATIONS: None; the patient is admitted to the hospital and receiving antibiotics. Antibiotics were administered within an appropriate time frame prior to the initiation of the procedure. ANESTHESIA/SEDATION: General anesthesia CONTRAST:  Lipiodol 8 cc administered into the lymphatic system FLUOROSCOPY TIME:  48 minutes, 30 seconds (9,163 mGy) COMPLICATIONS: None immediate. PROCEDURE: Informed consent was obtained from  the patient following explanation of the procedure, risks, benefits and alternatives. All questions were  addressed. General anesthesia was initiated by the anesthesia team. A time out was performed prior to the initiation of the procedure. Maximal barrier sterile technique utilized including caps, mask, sterile gowns, sterile gloves, large sterile drape, hand hygiene, and Betadine prep. Sonographic evaluation was performed of the right groin demonstrating 2 adjacent hypertrophied though benign appearing inguinal lymph nodes. Under direct ultrasound guidance, the hilum of the dominant right inguinal lymph node was accessed with a 25 gauge spinal needle. Lipiodol injection was then performed demonstrating slow but steady passage of contrast through the adjacent lymphatic system. Once the contrast reached the right hemipelvis the identical procedure was performed at the left groin ultimately with contrast opacification of the left inguinal/pelvic lymphatic system. Multiple ultrasound images were saved procedural documentation purposes. Next, multiple spot radiographic images were obtained of the abdomen and pelvis ultimately demonstrating faint and transient opacification of two divisions of the thoracic duct at the level of the approximate L1 vertebral body. With the gantry angled cranially and utilizing a slight lateral to medial trajectory (in hopes of avoiding the abdominal aorta), attempts were made to cannulate the dominant paired division of the thoracic duct. Ultimately, a wire was felt to be cannulate the dominant division of the thoracic duct however despite prolonged efforts, a microcatheter could not be advanced through the subcutaneous and abdominal track to the cannulation site. Three additional attempts were made to cannulate the thoracic duct however despite fluoroscopic confirmation of fenestration, the duct was unable to be successfully re-cannulated. At this point, given the length the procedure and  fluoroscopic time utilized, the procedure was terminated. All needles and wires were removed from the patient. Postprocedural spot radiographic images were obtained of the abdomen and pelvis. Multiple spot fluoroscopic and radiographic images were obtained of the abdomen and pelvis. Bandages were applied. The patient tolerated the procedure well without immediate post procedural complication. FINDINGS: Sonographic evaluation demonstrates prominent though non pathologically enlarged bilateral inguinal lymph nodes the dominant of which were successfully cannulated bilaterally with 25 gauge needles allowing for lymphangiogram. Despite prolonged efforts, the thoracic duct was unable to be cannulated though was felt to be successfully fenestrated. Postprocedural spot radiographic images demonstrate Lipiodol within the pelvic lymphatic venous system extending to the level of the upper abdomen. IMPRESSION: Technically successful image guided lymphangiogram with thoracic duct fenestration but unsuccessful fluoroscopic guided thoracic duct cannulation and embolization. PLAN: - Continue patient on low fat diet. - Continue to monitor daily output from the patient's left-sided chest tube. - If patient continues with high volume chylous output from the left-sided chest tube, could consider repeat attempt at lymphangiogram and potential thoracic duct embolization, potentially attempting access from the left subclavian vein. Above discussed with Dr. Servando Snare at the time of procedure completion. Electronically Signed   By: Sandi Mariscal M.D.   On: 03/23/2020 14:08   IR Fluoro Guide Ndl Plmt / BX  Result Date: 03/23/2020 INDICATION: History of esophagectomy, now with persistent left-sided chylous effusion. Request made for lymphangiogram and thoracic duct fenestration/embolization. EXAM: 1. ULTRASOUND-GUIDED CANNULATION BILATERAL INGUINAL LYMPH NODES 2. LYMPHANGIOGRAM VERSUS BILATERAL INGUINAL ACCESS 3. FLUOROSCOPIC GUIDED  FENESTRATION OF THE THORACIC DUCT BELOW THE LEVEL OF THE DIAPHRAGM COMPARISON:  Chest radiograph-03/22/2020; chest CT-03/07/2020; CT guided left-sided chest tube placement-03/08/2020 MEDICATIONS: None; the patient is admitted to the hospital and receiving antibiotics. Antibiotics were administered within an appropriate time frame prior to the initiation of the procedure. ANESTHESIA/SEDATION: General anesthesia CONTRAST:  Lipiodol 8 cc administered into the lymphatic system FLUOROSCOPY TIME:  48  minutes, 30 seconds (4,431 mGy) COMPLICATIONS: None immediate. PROCEDURE: Informed consent was obtained from the patient following explanation of the procedure, risks, benefits and alternatives. All questions were addressed. General anesthesia was initiated by the anesthesia team. A time out was performed prior to the initiation of the procedure. Maximal barrier sterile technique utilized including caps, mask, sterile gowns, sterile gloves, large sterile drape, hand hygiene, and Betadine prep. Sonographic evaluation was performed of the right groin demonstrating 2 adjacent hypertrophied though benign appearing inguinal lymph nodes. Under direct ultrasound guidance, the hilum of the dominant right inguinal lymph node was accessed with a 25 gauge spinal needle. Lipiodol injection was then performed demonstrating slow but steady passage of contrast through the adjacent lymphatic system. Once the contrast reached the right hemipelvis the identical procedure was performed at the left groin ultimately with contrast opacification of the left inguinal/pelvic lymphatic system. Multiple ultrasound images were saved procedural documentation purposes. Next, multiple spot radiographic images were obtained of the abdomen and pelvis ultimately demonstrating faint and transient opacification of two divisions of the thoracic duct at the level of the approximate L1 vertebral body. With the gantry angled cranially and utilizing a slight lateral  to medial trajectory (in hopes of avoiding the abdominal aorta), attempts were made to cannulate the dominant paired division of the thoracic duct. Ultimately, a wire was felt to be cannulate the dominant division of the thoracic duct however despite prolonged efforts, a microcatheter could not be advanced through the subcutaneous and abdominal track to the cannulation site. Three additional attempts were made to cannulate the thoracic duct however despite fluoroscopic confirmation of fenestration, the duct was unable to be successfully re-cannulated. At this point, given the length the procedure and fluoroscopic time utilized, the procedure was terminated. All needles and wires were removed from the patient. Postprocedural spot radiographic images were obtained of the abdomen and pelvis. Multiple spot fluoroscopic and radiographic images were obtained of the abdomen and pelvis. Bandages were applied. The patient tolerated the procedure well without immediate post procedural complication. FINDINGS: Sonographic evaluation demonstrates prominent though non pathologically enlarged bilateral inguinal lymph nodes the dominant of which were successfully cannulated bilaterally with 25 gauge needles allowing for lymphangiogram. Despite prolonged efforts, the thoracic duct was unable to be cannulated though was felt to be successfully fenestrated. Postprocedural spot radiographic images demonstrate Lipiodol within the pelvic lymphatic venous system extending to the level of the upper abdomen. IMPRESSION: Technically successful image guided lymphangiogram with thoracic duct fenestration but unsuccessful fluoroscopic guided thoracic duct cannulation and embolization. PLAN: - Continue patient on low fat diet. - Continue to monitor daily output from the patient's left-sided chest tube. - If patient continues with high volume chylous output from the left-sided chest tube, could consider repeat attempt at lymphangiogram and  potential thoracic duct embolization, potentially attempting access from the left subclavian vein. Above discussed with Dr. Servando Snare at the time of procedure completion. Electronically Signed   By: Sandi Mariscal M.D.   On: 03/23/2020 14:08   DG Chest Port 1 View  Result Date: 03/24/2020 CLINICAL DATA:  Chest tube placement. EXAM: PORTABLE CHEST 1 VIEW COMPARISON:  03/22/2020 FINDINGS: The cardiomediastinal silhouette is unchanged. A left-sided chest tube remains in place. No pneumothorax is identified. A skin fold or other artifact overlies the right chest. Basilar predominant airspace opacities bilaterally and pleural effusions are unchanged. IMPRESSION: Unchanged bibasilar airspace disease and pleural effusions. No pneumothorax. Electronically Signed   By: Logan Bores M.D.   On: 03/24/2020 11:54  IR LYMPHANGIOGRAM PEL/ABD BILAT  Result Date: 03/23/2020 INDICATION: History of esophagectomy, now with persistent left-sided chylous effusion. Request made for lymphangiogram and thoracic duct fenestration/embolization. EXAM: 1. ULTRASOUND-GUIDED CANNULATION BILATERAL INGUINAL LYMPH NODES 2. LYMPHANGIOGRAM VERSUS BILATERAL INGUINAL ACCESS 3. FLUOROSCOPIC GUIDED FENESTRATION OF THE THORACIC DUCT BELOW THE LEVEL OF THE DIAPHRAGM COMPARISON:  Chest radiograph-03/22/2020; chest CT-03/07/2020; CT guided left-sided chest tube placement-03/08/2020 MEDICATIONS: None; the patient is admitted to the hospital and receiving antibiotics. Antibiotics were administered within an appropriate time frame prior to the initiation of the procedure. ANESTHESIA/SEDATION: General anesthesia CONTRAST:  Lipiodol 8 cc administered into the lymphatic system FLUOROSCOPY TIME:  48 minutes, 30 seconds (8,366 mGy) COMPLICATIONS: None immediate. PROCEDURE: Informed consent was obtained from the patient following explanation of the procedure, risks, benefits and alternatives. All questions were addressed. General anesthesia was initiated by the  anesthesia team. A time out was performed prior to the initiation of the procedure. Maximal barrier sterile technique utilized including caps, mask, sterile gowns, sterile gloves, large sterile drape, hand hygiene, and Betadine prep. Sonographic evaluation was performed of the right groin demonstrating 2 adjacent hypertrophied though benign appearing inguinal lymph nodes. Under direct ultrasound guidance, the hilum of the dominant right inguinal lymph node was accessed with a 25 gauge spinal needle. Lipiodol injection was then performed demonstrating slow but steady passage of contrast through the adjacent lymphatic system. Once the contrast reached the right hemipelvis the identical procedure was performed at the left groin ultimately with contrast opacification of the left inguinal/pelvic lymphatic system. Multiple ultrasound images were saved procedural documentation purposes. Next, multiple spot radiographic images were obtained of the abdomen and pelvis ultimately demonstrating faint and transient opacification of two divisions of the thoracic duct at the level of the approximate L1 vertebral body. With the gantry angled cranially and utilizing a slight lateral to medial trajectory (in hopes of avoiding the abdominal aorta), attempts were made to cannulate the dominant paired division of the thoracic duct. Ultimately, a wire was felt to be cannulate the dominant division of the thoracic duct however despite prolonged efforts, a microcatheter could not be advanced through the subcutaneous and abdominal track to the cannulation site. Three additional attempts were made to cannulate the thoracic duct however despite fluoroscopic confirmation of fenestration, the duct was unable to be successfully re-cannulated. At this point, given the length the procedure and fluoroscopic time utilized, the procedure was terminated. All needles and wires were removed from the patient. Postprocedural spot radiographic images were  obtained of the abdomen and pelvis. Multiple spot fluoroscopic and radiographic images were obtained of the abdomen and pelvis. Bandages were applied. The patient tolerated the procedure well without immediate post procedural complication. FINDINGS: Sonographic evaluation demonstrates prominent though non pathologically enlarged bilateral inguinal lymph nodes the dominant of which were successfully cannulated bilaterally with 25 gauge needles allowing for lymphangiogram. Despite prolonged efforts, the thoracic duct was unable to be cannulated though was felt to be successfully fenestrated. Postprocedural spot radiographic images demonstrate Lipiodol within the pelvic lymphatic venous system extending to the level of the upper abdomen. IMPRESSION: Technically successful image guided lymphangiogram with thoracic duct fenestration but unsuccessful fluoroscopic guided thoracic duct cannulation and embolization. PLAN: - Continue patient on low fat diet. - Continue to monitor daily output from the patient's left-sided chest tube. - If patient continues with high volume chylous output from the left-sided chest tube, could consider repeat attempt at lymphangiogram and potential thoracic duct embolization, potentially attempting access from the left subclavian vein. Above discussed  with Dr. Servando Snare at the time of procedure completion. Electronically Signed   By: Sandi Mariscal M.D.   On: 03/23/2020 14:08   [place] EKG SITE RITE  Result Date: 03/24/2020 If Site Rite image not attached, placement could not be confirmed due to current cardiac rhythm.   Labs:  CBC: Recent Labs    03/23/20 0446 03/24/20 0024 03/25/20 0430 03/27/20 0430  WBC 8.0 10.2 12.7* 10.1  HGB 10.6* 9.5* 9.9* 9.2*  HCT 30.8* 28.2* 30.2* 28.7*  PLT 522* 509* 543* 419*    COAGS: Recent Labs    09/18/19 0808 02/22/20 0830 03/08/20 0015 03/23/20 0446  INR 1.2 1.0 1.1 1.2  APTT  --  30  --   --     BMP: Recent Labs    12/27/19 0810  12/27/19 0810 01/03/20 0813 01/03/20 0813 01/11/20 0814 01/11/20 0814 01/26/20 1426 02/22/20 0830 03/24/20 0024 03/25/20 0430 03/26/20 0358 03/27/20 0430  NA 136   < > 140   < > 139   < > 140   < > 125* 129* 129* 129*  K 4.0   < > 3.9   < > 4.2   < > 4.2   < > 4.0 4.3 4.5 4.5  CL 107   < > 106   < > 105   < > 106   < > 85* 88* 92* 95*  CO2 23   < > 27   < > 29   < > 29   < > 29 31 30 27   GLUCOSE 117*   < > 127*   < > 105*   < > 99   < > 264* 133* 159* 139*  BUN 14   < > 12   < > 11   < > 10   < > 40* 26* 17 15  CALCIUM 9.4   < > 9.4   < > 9.0   < > 8.8*   < > 8.6* 9.1 8.8* 8.6*  CREATININE 0.66   < > 0.63   < > 0.68   < > 0.71   < > 0.77 0.63 0.49* 0.49*  GFRNONAA >60   < > >60   < > >60   < > >60   < > >60 >60 >60 >60  GFRAA >60  --  >60  --  >60  --  >60  --   --   --   --   --    < > = values in this interval not displayed.    LIVER FUNCTION TESTS: Recent Labs    02/22/20 0830 03/19/20 0043 03/24/20 0024 03/27/20 0430  BILITOT 0.5 0.6 0.3 0.5  AST 29 96* 57* 22  ALT 19 152* 164* 58*  ALKPHOS 73 156* 158* 120  PROT 6.8 7.1 7.2 6.3*  ALBUMIN 3.7 1.8* 1.9* 1.9*    Assessment and Plan:  Post op chylous left pleural effusion Unsuccessful attempt at IR thoracic duct embolization 03/23/20 Could consider re attempt---- Plan per TCTS   Electronically Signed: Lavonia Drafts, PA-C 03/27/2020, 8:13 AM   I spent a total of 15 Minutes at the the patient's bedside AND on the patient's hospital floor or unit, greater than 50% of which was counseling/coordinating care for left chylous pleural effusion--unsuccessful thoracic duct embolization

## 2020-03-27 NOTE — Progress Notes (Signed)
PHARMACY - TOTAL PARENTERAL NUTRITION CONSULT NOTE  Indication:  Chylothorax  Patient Measurements: Height: 5\' 11"  (180.3 cm) Weight: 83.9 kg (184 lb 15.5 oz) IBW/kg (Calculated) : 75.3 TPN AdjBW (KG): 81.4 Body mass index is 25.8 kg/m. Usual Weight: 118-122 kg prior to cancer dx, now at 83.9 kg  Assessment:  30 YOM with esophageal cancer presented on 02/24/20 for esophagectomy and J-tube placement.  Patient was started on TF post-op and was briefly trialed on a clear liquid diet.  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  Patient underwent lymphangiogram and thoracic duct fenestration on 03/23/20 - unable to cannulate the thoracic duct.  Pharmacy consulted to manage TPN to allow for healing as low-fat, MCT TF formula has not been successful.  Glucose / Insulin: no hx DM - CBGs controlled.  Required 17 units SSI yesterday. Electrolytes: Na/CL low stable, CoCa down to 10.28, Mag low normal, K stable and Phos trending down Renal: Scr ~0.5 stable, BUN down to 15 (off Lasix 40mg  IV BID on 11/17) LFTs / TGs: LFTs downtrending to normal, tbili / TG WNL Prealbumin / albumin: prealbumin 15.4, albumin 1.9, LBM 11/20 Intake / Output; MIVF: UOP 0.8 ml/kg/hr, chest tube 1292ml, net -14L GI Imaging: none since TPN  Surgeries / Procedures: none since TPN  Central access: PICC placed 03/24/20 TPN start date: 03/24/20  Nutritional Goals (per RD rec 11/15): 4742-5956 kCal and 140-160gm protein per day  Current Nutrition:  Clear liquid diet TPN  Plan:  Continue TPN at goal rate of 110 ml/hr, providing 150g AA, 383g CHO and 79g ILE for a total of 2695 kCal, meeting 100% of patient needs Electrolytes in TPN: incr Na 166mEq/L, decr K slightly to 81mEq/L, incr Mag slightly to 27mEq/L; continue Calcium 1.46mEq/L,Phos 10 mmol/L, max CL Add standard MVI and trace elements to TPN Continue moderate SSI Q4H - consider adding  insulin to TPN if continues to use> 10 units per day Standard TPN labs on Mon/Thurs, monitor chest tube output and I/O's   Benetta Spar, PharmD, BCPS, [facility] Clinical Pharmacist  Please check AMION for all [place] phone numbers After 10:00 PM, call [place]

## 2020-03-27 NOTE — Progress Notes (Signed)
Occupational Therapy Treatment Patient Details Name: Brett Dawson MRN: [id number] DOB: April 05, 1956 Today's Date: 03/27/2020    History of present illness 64 yo male with Esophageal cancer. s/p 02/24/20 robotic assisted Ivor-Lewis esophagectomy with jejunostomy tube placement. Pt with increasing shortness of breath and fever with chest tubes placed x 2 (11/1 and 11/2)   OT comments  Pt's goals downgraded this session. Pt received in bed, pt with self limiting behaviors and decreased activity tolerance noted this session. Pt required minguard assistance for stand-pivot transfer x3 after 3 transfers pt requesting to relax in recliner reports increased fatigue and no desire to complete additional ADL. Therapist educated pt on activity progression. Vitals within normal range throughout session, pt on 8L HFNC. Pt will continue to benefit from skilled OT services to maximize safety and independence with ADL/IADL and functional mobility. Will continue to follow acutely and progress as tolerated.    Follow Up Recommendations  Home health OT;Supervision/Assistance - 24 hour    Equipment Recommendations  3 in 1 bedside commode    Recommendations for Other Services      Precautions / Restrictions Precautions Precautions: Fall;Other (comment) Precaution Comments: chest tube, gtube Restrictions Weight Bearing Restrictions: No       Mobility Bed Mobility Overal bed mobility: Needs Assistance Bed Mobility: Supine to Sit     Supine to sit: Supervision     General bed mobility comments: used bed rail, HOB slightly elevated  Transfers Overall transfer level: Needs assistance Equipment used: Rolling walker (2 wheeled) Transfers: Risk manager;Sit to/from Stand Sit to Stand: Min guard Stand pivot transfers: Min guard       General transfer comment: minguard for safety, pt with decreased desire to stand longer than necessary for stand pivot, reports feeling increased fatigue this  date    Balance Overall balance assessment: Needs assistance Sitting-balance support: Feet supported;No upper extremity supported Sitting balance-Leahy Scale: Good     Standing balance support: During functional activity;Bilateral upper extremity supported Standing balance-Leahy Scale: Poor Standing balance comment: reliant on BUE support                           ADL either performed or assessed with clinical judgement   ADL Overall ADL's : Needs assistance/impaired Eating/Feeding: Independent   Grooming: Set up;Sitting                   Toilet Transfer: Min Public librarian Details (indicate cue type and reason): from EOB>BSC>EOB>recliner Toileting- Clothing Manipulation and Hygiene: Minimal assistance Toileting - Clothing Manipulation Details (indicate cue type and reason): minA for posterior care.      Functional mobility during ADLs: Min guard;Rolling walker General ADL Comments: pt on 8L HFNC     Vision       Perception     Praxis      Cognition Arousal/Alertness: Awake/alert Behavior During Therapy: Flat affect Overall Cognitive Status: Within Functional Limits for tasks assessed                                 General Comments: pt with self limiting behaviors        Exercises Other Exercises Other Exercises: B shoulder elevation 1x10 reps. Other Exercises: scap retraction 1x10 reps.   Shoulder Instructions       General Comments Pt on 8L HFNC vss    Pertinent Vitals/ Pain  Pain Assessment: Faces Faces Pain Scale: Hurts little more Pain Location: back pain Pain Descriptors / Indicators: Guarding Pain Intervention(s): Monitored during session;Limited activity within patient's tolerance  Home Living                                          Prior Functioning/Environment              Frequency  Min 2X/week        Progress Toward Goals  OT Goals(current  goals can now be found in the care plan section)  Progress towards OT goals: Not progressing toward goals - comment;Goals drowngraded-see care plan  Acute Rehab OT Goals Patient Stated Goal: to be I again OT Goal Formulation: With patient Time For Goal Achievement: 04/10/20 Potential to Achieve Goals: Good ADL Goals Pt Will Transfer to Toilet: ambulating;regular height toilet;with min assist Pt Will Perform Tub/Shower Transfer: Tub transfer;ambulating;with min guard assist Pt/caregiver will Perform Home Exercise Program: Increased strength;With theraband;Independently (level 3 tband) Additional ADL Goal #1: Pt will complete 2-3 grooming ADL at sink level after setup assistance.   Plan Discharge plan remains appropriate    Co-evaluation                 AM-PAC OT "6 Clicks" Daily Activity     Outcome Measure   Help from another person eating meals?: None Help from another person taking care of personal grooming?: A Little Help from another person toileting, which includes using toliet, bedpan, or urinal?: A Little Help from another person bathing (including washing, rinsing, drying)?: A Little Help from another person to put on and taking off regular upper body clothing?: A Little Help from another person to put on and taking off regular lower body clothing?: A Little 6 Click Score: 19    End of Session Equipment Utilized During Treatment: Oxygen  OT Visit Diagnosis: Muscle weakness (generalized) (M62.81)   Activity Tolerance Patient tolerated treatment well;Patient limited by fatigue   Patient Left with call bell/phone within reach;in chair   Nurse Communication Mobility status        Time: 5465-0354 OT Time Calculation (min): 21 min  Charges: OT General Charges $OT Visit: 1 Visit OT Treatments $Self Care/Home Management : 8-22 mins  Helene Kelp OTR/L Acute Rehabilitation Services Office: [place] 03/27/2020, 3:19 PM

## 2020-03-27 NOTE — Progress Notes (Addendum)
      [address]       [place] [address]             [phone]      Subjective:   No events PICC line placed, TPN infusing.   Objective: Vital signs in last 24 hours: Temp:  [98.7 F (37.1 C)-98.9 F (37.2 C)] 98.8 F (37.1 C) (11/22 0720) Pulse Rate:  [88-99] 94 (11/22 0720) Cardiac Rhythm: Sinus tachycardia (11/22 0705) Resp:  [14-18] 18 (11/22 0720) BP: (100-117)/(62-73) 110/72 (11/22 0720) SpO2:  [94 %-97 %] 94 % (11/22 0720) Weight:  [83.9 kg] 83.9 kg (11/22 0433)     Intake/Output from previous day: 11/21 0701 - 11/22 0700 In: 8841 [P.O.:480; I.V.:1210] Out: 2855 [Urine:1575; Chest Tube:1280] Intake/Output this shift: Total I/O In: 240 [P.O.:240] Out: -   General appearance: alert, cooperative and mild distress Heart: regular rate and rhythm Lungs: clear to auscultation bilaterally. The left pleural drain had about 1230ml  milky fluid past 24 hours.  Abdomen: soft, bowel sounds normal; Inflammation J-tube exit site nearly resolved, much less tender,  dressing is dry.. Extremities: no peripheral edema Wound: port incisions have healed.   Lab Results: Recent Labs    03/25/20 0430 03/27/20 0430  WBC 12.7* 10.1  HGB 9.9* 9.2*  HCT 30.2* 28.7*  PLT 543* 419*   BMET:  Recent Labs    03/26/20 0358 03/27/20 0430  NA 129* 129*  K 4.5 4.5  CL 92* 95*  CO2 30 27  GLUCOSE 159* 139*  BUN 17 15  CREATININE 0.49* 0.49*  CALCIUM 8.8* 8.6*    PT/INR:  No results for input(s): LABPROT, INR in the last 72 hours. ABG    Component Value Date/Time   PHART 7.454 (H) 02/25/2020 0500   HCO3 26.5 02/25/2020 0500   TCO2 27 02/24/2020 1944   ACIDBASEDEF 1.0 02/24/2020 1547   O2SAT 93.7 02/25/2020 0500   CBG (last 3)  Recent Labs    03/26/20 1945 03/26/20 2330 03/27/20 0433  GLUCAP 192* 252* 151*    Assessment/Plan: S/P Procedure(s) (LRB): Malignant neoplasm of esophagus (N/A)    -POD-32 robotic assisted ivor-Lewis  esophagectomy for distal esophageal adenocarcinoma. Follow up esophograms have shown no anastomotic leak.   -Chylous left pleural effusion- Transitioned to TPN on 11/21.  Will continue for 1 week, and reassess.  Will also speak with radiology to attempt another lymphogram  -Hyponatremia- stable, monitor  -ID-Leukocytosis resolved, no further fevers.  -Renal- stable function.   -DVT PPX- on SQ enoxaparin daily.    [name], PA-C [phone]  Agree with above Will re-attempt lymphogram next week.  Xiomar Crompton [name]

## 2020-03-27 NOTE — Progress Notes (Signed)
Physical Therapy Treatment Patient Details Name: Brett Dawson MRN: [id number] DOB: [birth year] Today's Date: 03/27/2020    History of Present Illness 64 yo male with Esophageal cancer. s/p 02/24/20 robotic assisted Ivor-Lewis esophagectomy with jejunostomy tube placement. Pt with increasing shortness of breath and fever with chest tubes placed x 2 (11/1 and 11/2)    PT Comments    Limited session due to pt is self limiting.  Performed exercise and educated on importance of increasing mobility.  He reports he got lightheaded when he got up earlier and does not want to try again right now - educated in regards to increasing activity tolerance and OOB activity to improve lightheadedness.   Pt states exercises are "no problem/easy" but doesn't want to try standing today.  Follow Up Recommendations  Home health PT;Supervision/Assistance - 24 hour     Equipment Recommendations  None recommended by PT    Recommendations for Other Services       Precautions / Restrictions Precautions Precautions: Fall Precaution Comments: chest tube, gtube Restrictions Weight Bearing Restrictions: No    Mobility  Bed Mobility Overal bed mobility: Needs Assistance Bed Mobility: Supine to Sit     Supine to sit: Supervision     General bed mobility comments: in chair  Transfers Overall transfer level: Needs assistance Equipment used: Rolling walker (2 wheeled) Transfers: Stand Pivot Transfers;Sit to/from Stand Sit to Stand: Min guard Stand pivot transfers: Min guard       General transfer comment: refused  Ambulation/Gait                 Stairs             Wheelchair Mobility    Modified Rankin (Stroke Patients Only)       Balance Overall balance assessment: Needs assistance Sitting-balance support: Feet supported;No upper extremity supported Sitting balance-Leahy Scale: Good     Standing balance support: During functional activity;Bilateral upper extremity  supported Standing balance-Leahy Scale: Poor Standing balance comment: refused                            Cognition Arousal/Alertness: Awake/alert Behavior During Therapy: Flat affect Overall Cognitive Status: Within Functional Limits for tasks assessed                                 General Comments: pt with self limiting behaviors      Exercises General Exercises - Lower Extremity Ankle Circles/Pumps: AROM;Both;20 reps;Seated Long Arc Quad: AROM;Both;Seated;20 reps Hip Flexion/Marching: AROM;Both;Seated;20 reps Other Exercises Other Exercises: B shoulder elevation 1x10 reps. Other Exercises: scap retraction 1x10 reps. Other Exercises: Trunk flexion - moving from leaning on recliner to upright at edge of chair x 8    General Comments General comments (skin integrity, edema, etc.): Pt on 8 L HFNC with stable vitals.  Pt reports he got lightheaded when he got up earlier and does not want to try again.  Discussed/educated that it is important to increase OOB activity, standing, walking.  Educated that high chance lightheadedness from limited time OOB.  Pt had recently moved to chair and declined further standing/walking, but agreeable to exercise.      Pertinent Vitals/Pain Pain Assessment: Faces Faces Pain Scale: Hurts little more Pain Location: back pain Pain Descriptors / Indicators: Guarding Pain Intervention(s): Limited activity within patient's tolerance;Monitored during session;Repositioned    Home Living  Prior Function            PT Goals (current goals can now be found in the care plan section) Acute Rehab PT Goals Patient Stated Goal: to be I again PT Goal Formulation: With patient Time For Goal Achievement: 03/30/20 Potential to Achieve Goals: Fair Progress towards PT goals: Progressing toward goals    Frequency    Min 3X/week      PT Plan Current plan remains appropriate    Co-evaluation               AM-PAC PT "6 Clicks" Mobility   Outcome Measure  Help needed turning from your back to your side while in a flat bed without using bedrails?: None Help needed moving from lying on your back to sitting on the side of a flat bed without using bedrails?: A Little Help needed moving to and from a bed to a chair (including a wheelchair)?: A Little Help needed standing up from a chair using your arms (e.g., wheelchair or bedside chair)?: A Little Help needed to walk in hospital room?: A Little Help needed climbing 3-5 steps with a railing? : A Lot 6 Click Score: 18    End of Session Equipment Utilized During Treatment: Oxygen Activity Tolerance: Other (comment) (self limiting) Patient left: in chair;with call bell/phone within reach Nurse Communication: Mobility status PT Visit Diagnosis: Other abnormalities of gait and mobility (R26.89)     Time: 1884-1660 PT Time Calculation (min) (ACUTE ONLY): 14 min  Charges:  $Therapeutic Exercise: 8-22 mins                     Abran Richard, PT Acute Rehab Services Pager [phone] [facility] Rehab [phone]     Karlton Lemon 03/27/2020, 3:48 PM

## 2020-03-27 NOTE — Progress Notes (Signed)
Nutrition Follow-up  DOCUMENTATION CODES:   Severe malnutrition in context of acute illness/injury  INTERVENTION:   - Continue TPN per Pharmacy to meet 100% of pt's estimated needs  Estimated Nutritional Needs:  Kcal:  2800-3000 Protein:  140-160 grams  NUTRITION DIAGNOSIS:   Severe Malnutrition related to acute illness (chylothorax) as evidenced by severe fat depletion, severe muscle depletion, percent weight loss (14.7% weight loss in less than 1 month).  Ongoing  GOAL:   Patient will meet greater than or equal to 90% of their needs  Met via TPN  MONITOR:   PO intake, Supplement acceptance, Labs, Weight trends, Skin, I & O's  REASON FOR ASSESSMENT:   Consult Enteral/tube feeding initiation and management  ASSESSMENT:   64 year old male who presented on 10/21 for Ivor Lewis esophagectomy and J-tube placement. 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  10/22 - TF initiated 10/26 - swallow study, no evidence of leak, NGT removed 10/27 - MBS, diet advanced to clears, transitioned to nocturnal TF 10/31 - s/p thoracentesis with 1 L fluid removed 11/01 - chest tube placed, NPO 11/03 - left chest tube placed by IR 11/04 - clear liquids 11/05 - JP drain removed 11/18 - lymphangiogram and unsuccessful thoracic duct embolization 11/19 - PICC placed, TPN initiated  Spoke with pt at bedside. Pt reports that he was told his sugar was high and that he was drinking too much juice. Pt reports that all he consumes by mouth is juice, water, and tea.  Per notes, plan for re-attempt of lymphangiogram next week.  Weight trending down. Will adjust estimated needs.  Current weight: 83.9 kg Admit weight: 99.6 kg  Meal Completion: 0-75% x last 8 documented meals (clear liquid diet)  Medications reviewed and include: SSI q 4 hours, protonix , TPN  Labs reviewed:  sodium 129, hemoglobin 9.2 CBG's: 149-252 x 24 hours  UOP: 1575 ml x 24 hours CT: 1280 ml x 24 hours I/O's: +2.8 L since admit  Diet Order:   Diet Order            Diet clear liquid Room service appropriate? Yes; Fluid consistency: Thin  Diet effective now                 EDUCATION NEEDS:   Education needs have been addressed  Skin:  Skin Assessment: Skin Integrity Issues: Incisions: chest, abdomen, groin  Last BM:  03/25/20  Height:   Ht Readings from Last 1 Encounters:  02/24/20 '5\' 11"'  (1.803 m)    Weight:   Wt Readings from Last 1 Encounters:  03/27/20 83.9 kg    Ideal Body Weight:  78.2 kg  BMI:  Body mass index is 25.8 kg/m.  Estimated Nutritional Needs:   Kcal:  2800-3000  Protein:  140-160 grams  Fluid:  >/= 2.2 L    Gustavus Bryant, MS, RD, LDN Inpatient Clinical Dietitian Please see AMiON for contact information.

## 2020-03-28 LAB — GLUCOSE, CAPILLARY
Glucose-Capillary: 129 mg/dL — ABNORMAL HIGH (ref 70–99)
Glucose-Capillary: 146 mg/dL — ABNORMAL HIGH (ref 70–99)
Glucose-Capillary: 165 mg/dL — ABNORMAL HIGH (ref 70–99)
Glucose-Capillary: 169 mg/dL — ABNORMAL HIGH (ref 70–99)
Glucose-Capillary: 174 mg/dL — ABNORMAL HIGH (ref 70–99)
Glucose-Capillary: 220 mg/dL — ABNORMAL HIGH (ref 70–99)

## 2020-03-28 LAB — COMPREHENSIVE METABOLIC PANEL
ALT: 40 U/L (ref 0–44)
AST: 27 U/L (ref 15–41)
Albumin: 1.8 g/dL — ABNORMAL LOW (ref 3.5–5.0)
Alkaline Phosphatase: 121 U/L (ref 38–126)
Anion gap: 8 (ref 5–15)
BUN: 13 mg/dL (ref 8–23)
CO2: 25 mmol/L (ref 22–32)
Calcium: 8.1 mg/dL — ABNORMAL LOW (ref 8.9–10.3)
Chloride: 98 mmol/L (ref 98–111)
Creatinine, Ser: 0.45 mg/dL — ABNORMAL LOW (ref 0.61–1.24)
GFR, Estimated: >60 mL/min (ref >60)
Glucose, Bld: 136 mg/dL — ABNORMAL HIGH (ref 70–99)
Potassium: 4.2 mmol/L (ref 3.5–5.1)
Sodium: 131 mmol/L — ABNORMAL LOW (ref 135–145)
Total Bilirubin: 0.2 mg/dL — ABNORMAL LOW (ref 0.3–1.2)
Total Protein: 6.3 g/dL — ABNORMAL LOW (ref 6.5–8.1)

## 2020-03-28 LAB — PHOSPHORUS: Phosphorus: 2.5 mg/dL (ref 2.5–4.6)

## 2020-03-28 LAB — MAGNESIUM: Magnesium: 1.7 mg/dL (ref 1.7–2.4)

## 2020-03-28 MED ORDER — AYR SALINE NASAL NA GEL
1.0000 "application " | NASAL | Status: DC | PRN
  Administered 2020-03-28 – 2020-03-29 (×2): 1 via NASAL
  Filled 2020-03-28: qty 14.1

## 2020-03-28 MED ORDER — TRAVASOL 10 % IV SOLN
INTRAVENOUS | Status: AC
  Filled 2020-03-28: qty 1903.2

## 2020-03-28 MED ORDER — DEXAMETHASONE SODIUM PHOSPHATE 10 MG/ML IJ SOLN
10.0000 mg | Freq: Three times a day (TID) | INTRAMUSCULAR | Status: AC
  Administered 2020-03-28 – 2020-03-31 (×9): 10 mg via INTRAVENOUS
  Filled 2020-03-28 (×10): qty 1

## 2020-03-28 NOTE — Progress Notes (Addendum)
[address]       [place] [address]             [phone]      5 Days Post-Op Procedure(s) (LRB): Malignant neoplasm of esophagus (N/A)   Subjective:  Patient upset and frustrated this morning.  Patient feels like no one is doing anything for him.  He has been here for a month and hasn't made much progress.  He feels likes his complaints are being ignored and if we cant help him then he wants transferred somewhere else.  He continues to complain of not being able to breath.  He states his nostrils are blocked up and all we have done is give him things that don't work.  He states they are just bleeding and he still cant breath.  He doesn't want to wait another week for his chest tube problem to be fixed and he asks if the appropriate staff will be present the next time.  Objective: Vital signs in last 24 hours: Temp:  [97.8 F (36.6 C)-99.8 F (37.7 C)] 98 F (36.7 C) (11/23 0737) Pulse Rate:  [92-100] 99 (11/23 0737) Cardiac Rhythm: Sinus tachycardia (11/23 0701) Resp:  [16-23] 21 (11/23 0737) BP: (99-116)/(64-75) 108/67 (11/23 0737) SpO2:  [90 %-97 %] 94 % (11/23 0737)  Intake/Output from previous day: 11/22 0701 - 11/23 0700 In: 3127.9 [P.O.:450; I.V.:2677.9] Out: 2450 [Urine:800; Chest Tube:1650] Intake/Output this shift: Total I/O In: -  Out: 500 [Urine:500]  General appearance: alert and frustrated, shouting Heart: regular rate and rhythm Lungs: clear to auscultation bilaterally Abdomen: soft, non-tender; bowel sounds normal; no masses,  no organomegaly Wound: well healed  Lab Results: Recent Labs    03/27/20 0430  WBC 10.1  HGB 9.2*  HCT 28.7*  PLT 419*   BMET:  Recent Labs    03/26/20 0358 03/27/20 0430  NA 129* 129*  K 4.5 4.5  CL 92* 95*  CO2 30 27  GLUCOSE 159* 139*  BUN 17 15  CREATININE 0.49* 0.49*  CALCIUM 8.8* 8.6*    PT/INR: No results for input(s): LABPROT, INR in the last 72 hours. ABG    Component Value  Date/Time   PHART 7.454 (H) 02/25/2020 0500   HCO3 26.5 02/25/2020 0500   TCO2 27 02/24/2020 1944   ACIDBASEDEF 1.0 02/24/2020 1547   O2SAT 93.7 02/25/2020 0500   CBG (last 3)  Recent Labs    03/27/20 2318 03/28/20 0338 03/28/20 0733  GLUCAP 130* 165* 174*    Assessment/Plan: S/P Procedure(s) (LRB): Malignant neoplasm of esophagus (N/A)  1. Chylothorax on left- CT output 1650 of milky discharge, embolization wasn't successful plan to repeat in 1 week 2. Nasal obstruction?- patient states nasal passages are blocked.. he has been treated with nasal saline, nasal steroids with no relief, patient does mention he has polyp... I have contacted ENT for consult.  I spoke with Dr. Wilburn Cornelia who recommended IV decadron for symptomatic relief.  If the patient does in fact have a nasal polyp this would likely require surgical intervention to be done an outpatient basis once he has improved from his current condition. 3. Hyponatremia- will recheck BMET in AM has been stable 4. DVT prophylaxis continue Lovenox 5. Dispo- patient stable, will try IV steroid taper per ENT recommendations, will set up outpatient follow up once patient has improved from current medical issues, continue NPO by mouth, on TPN, hopefully IR can coil thoracic duct in the near future  LOS: 33 days    Ellwood Handler, PA-C 03/28/2020  He continues to have significant output from his thoracic duct leak. He also complains of severe nasal congestion.  ENT has been consulted to assist with this. I have spoken with interventional radiology and they plan on attempting another Lymphogram with coil embolization next week. Continue chest tube drainage for now.  [place] [name]

## 2020-03-28 NOTE — Progress Notes (Addendum)
[place] delivery to front desk. Secretary signed for package and delivered to patients room. Pt asked secretary to put in chair.

## 2020-03-28 NOTE — Progress Notes (Signed)
PT Cancellation Note  Patient Details Name: Brett Dawson MRN: [id number] DOB: May 11, 1955   Cancelled Treatment:    Reason Eval/Treat Not Completed: Patient declined, no reason specified Pt states "I'm angry. No one is doing anything for me." Pt extremely frustrated with progression of his care. Does not understand why no one has been able to make his chest tube has not stopped draining. Refuses to work with therapy because he is "so mad". PT will follow back tomorrow to see if he is amenable to work with therapy.  Yoana Staib B. Migdalia Dk PT, DPT Acute Rehabilitation Services Pager [phone] Office [phone]     [place] 03/28/2020, 1:00 PM

## 2020-03-28 NOTE — Progress Notes (Signed)
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  Ongoing  GOAL:   Patient will meet greater than or equal to 90% of their needs  Met via TPN at new goal rate  MONITOR:   PO intake, Supplement acceptance, Labs, Weight trends, Skin, I & O's  REASON FOR ASSESSMENT:   Consult Enteral/tube feeding initiation and management  ASSESSMENT:   64 year old male who presented on 10/21 for Ivor Lewis esophagectomy and J-tube placement. 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  10/22 - TF initiated 10/26 - swallow study, no evidence of leak, NGT removed 10/27 - MBS, diet advanced to clears, transitioned to nocturnal TF 10/31 - s/p thoracentesis with 1 L fluid removed 11/01 - chest tube placed, NPO 11/03 - left chest tube placed by IR 11/04 - clear liquids 11/05 - JP drain removed 11/18 - lymphangiogram and unsuccessful thoracic duct embolization 11/19 - PICC placed, TPN initiated10/22 - TF initiated 10/26 - swallow study, no evidence of leak, NGT removed 10/27 - MBS, diet advanced to clears, transitioned to nocturnal TF 10/31 - s/p thoracentesis with 1 L fluid removed 11/01 - chest tube placed, NPO 11/03 - left chest tube placed by IR 11/04 - clear liquids 11/05 - JP drain removed 11/18 - lymphangiogram and unsuccessful thoracic duct embolization 11/19 - PICC placed, TPN initiated  Pt continues to have chylous output from chest tube. Per CTS, plan is for repeat attempt at embolization of thoracic duct in 1  week.  Estimated needs adjusted to account for ongoing losses from chest tube output.  Met with pt at bedside. Pt expressing frustration that "no one is doing anything for me." Pt states that he is angry and discouraged. RD provided empathetic listening.  Admit weight: 99.6 kg Current weight: 83.9 kg  Meal Completion: 25-75% (clear liquid diet)  Medications reviewed and include: IV decadron, SSI q 4 hours, protonix, TPN  Labs reviewed: sodium 131 CBG's: 129-174 x 24 hours  UOP: 800 ml x 24 hours CT: 1650 ml x 24 hours I/O's: +3.0 L since admit  Diet Order:   Diet Order            Diet clear liquid Room service appropriate? Yes; Fluid consistency: Thin  Diet effective now                 EDUCATION NEEDS:   Education needs have been addressed  Skin:  Skin Assessment: Skin Integrity Issues: Incisions: chest, abdomen, groin  Last BM:  03/27/20 per pt report  Height:   Ht Readings from Last 1 Encounters:  02/24/20 5' 11" (1.803 m)    Weight:   Wt Readings from Last 1 Encounters:  03/27/20 83.9 kg    Ideal Body Weight:  78.2 kg  BMI:  Body mass index is 25.8 kg/m.  Estimated Nutritional Needs:   Kcal:  2800-3000  Protein:  190-210 grams  Fluid:  >/= 2.2 L    Gustavus Bryant, MS, RD, LDN Inpatient Clinical Dietitian Please see AMiON for contact information.

## 2020-03-28 NOTE — Consult Note (Signed)
[facility] CM Inpatient Consult   03/28/2020  Brett Dawson 28-May-1955 [id number]   Follow up:  Disposition needs  Patient remains in active status with [facility] RN Care Manager Coordinator for post hospital complex disease management.  Reviewed PT notes regarding post hospital needs. Plan: Continue to follow up with inpatient [facility] team for disposition needs and progress.[name], RN BSN CCM [facility]  [phone] business mobile phone Toll free office [phone]  Fax number: [phone] [name]    Plan: Follow for disposition needs

## 2020-03-28 NOTE — Progress Notes (Signed)
PHARMACY - TOTAL PARENTERAL NUTRITION CONSULT NOTE  Indication: Chylothorax  Patient Measurements: Height: 5\' 11"  (180.3 cm) Weight: 83.9 kg (184 lb 15.5 oz) IBW/kg (Calculated) : 75.3 TPN AdjBW (KG): 81.4 Body mass index is 25.8 kg/m. Usual Weight: 118-122 kg prior to cancer dx, now at 83.9 kg  Assessment:  33 YOM with esophageal cancer presented on 02/24/20 for esophagectomy and J-tube placement.  Patient was started on TF post-op and was briefly trialed on a clear liquid diet.  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 Patient underwent lymphangiogram and thoracic duct fenestration on 03/23/20 - unable to cannulate the thoracic duct. Pharmacy consulted to manage TPN to allow for healing as low-fat, MCT TF formula has not been successful.  Glucose / Insulin: no hx DM - CBGs mostly controlled <150. Required 10 units SSI in last 24hrs. Electrolytes: Na 131 (up), Mag low-normal 1.7, others stable WNL Renal: Scr 0.45 stable, BUN down to 13 (off Lasix 40mg  IV BID on 11/17) LFTs / TGs: LFTs down to WNL. Tbili / TG WNL Prealbumin / albumin: prealbumin 15.4, albumin 1.8, LBM 11/20 Intake / Output; MIVF: UOP 0.4 ml/kg/hr per documentation, chest tube 1425ml, net -13L GI Imaging: none since TPN  Surgeries / Procedures: none since TPN  Central access: PICC placed 03/24/20 TPN start date: 03/24/20  Nutritional Goals (per RD updated recommendations 11/23): 2800-3000 kCal, 190-210g protein, >/=2.2L fluid per day  Current Nutrition:  Clear liquid diet TPN  Plan:  Continue TPN at goal rate of 130 ml/hr (recalculated 11/23 per updated RD recommendations). TPN will provide 190g AA, 374g CHO and 87g SMOF lipids, for a total 2908 kCal, meeting 100% of patient needs. Electrolytes in TPN: Incr Mag to 12 mEq/L; otherwise continue same today - Na 150mEq/L, K 29mEq/L, Ca 1.47mEq/L, Phos 10 mmol/L, max CL Add standard MVI  and trace elements to TPN Continue moderate SSI Q4H and adjust as needed Monitor TPN labs, chest tube output, I/O's F/u IR plans - considering repeat attempt at thoracic duct embolization if chylous effusion persists >1 week   Arturo Morton, PharmD, BCPS Please check AMION for all [place] contact numbers Clinical Pharmacist 03/28/2020 8:10 AM

## 2020-03-29 ENCOUNTER — Other Ambulatory Visit: Payer: Self-pay | Admitting: *Deleted

## 2020-03-29 LAB — BASIC METABOLIC PANEL
Anion gap: 6 (ref 5–15)
BUN: 17 mg/dL (ref 8–23)
CO2: 24 mmol/L (ref 22–32)
Calcium: 8.4 mg/dL — ABNORMAL LOW (ref 8.9–10.3)
Chloride: 102 mmol/L (ref 98–111)
Creatinine, Ser: 0.48 mg/dL — ABNORMAL LOW (ref 0.61–1.24)
GFR, Estimated: >60 mL/min (ref >60)
Glucose, Bld: 246 mg/dL — ABNORMAL HIGH (ref 70–99)
Potassium: 4.6 mmol/L (ref 3.5–5.1)
Sodium: 132 mmol/L — ABNORMAL LOW (ref 135–145)

## 2020-03-29 LAB — GLUCOSE, CAPILLARY
Glucose-Capillary: 193 mg/dL — ABNORMAL HIGH (ref 70–99)
Glucose-Capillary: 194 mg/dL — ABNORMAL HIGH (ref 70–99)
Glucose-Capillary: 198 mg/dL — ABNORMAL HIGH (ref 70–99)
Glucose-Capillary: 204 mg/dL — ABNORMAL HIGH (ref 70–99)
Glucose-Capillary: 212 mg/dL — ABNORMAL HIGH (ref 70–99)
Glucose-Capillary: 224 mg/dL — ABNORMAL HIGH (ref 70–99)

## 2020-03-29 LAB — PHOSPHORUS: Phosphorus: 2.3 mg/dL — ABNORMAL LOW (ref 2.5–4.6)

## 2020-03-29 LAB — MAGNESIUM: Magnesium: 2 mg/dL (ref 1.7–2.4)

## 2020-03-29 MED ORDER — K PHOS MONO-SOD PHOS DI & MONO 155-852-130 MG PO TABS
250.0000 mg | ORAL_TABLET | Freq: Three times a day (TID) | ORAL | Status: AC
  Administered 2020-03-29 (×3): 250 mg
  Filled 2020-03-29 (×3): qty 1

## 2020-03-29 MED ORDER — TRAVASOL 10 % IV SOLN
INTRAVENOUS | Status: AC
  Filled 2020-03-29: qty 1903.2

## 2020-03-29 MED ORDER — K PHOS MONO-SOD PHOS DI & MONO 155-852-130 MG PO TABS
250.0000 mg | ORAL_TABLET | Freq: Three times a day (TID) | ORAL | Status: DC
  Filled 2020-03-29: qty 1

## 2020-03-29 MED ORDER — INSULIN ASPART 100 UNIT/ML ~~LOC~~ SOLN
0.0000 [IU] | SUBCUTANEOUS | Status: DC
  Administered 2020-03-29: 7 [IU] via SUBCUTANEOUS
  Administered 2020-03-29: 4 [IU] via SUBCUTANEOUS
  Administered 2020-03-29 – 2020-03-30 (×2): 7 [IU] via SUBCUTANEOUS
  Administered 2020-03-30 (×3): 4 [IU] via SUBCUTANEOUS
  Administered 2020-03-30 (×3): 7 [IU] via SUBCUTANEOUS
  Administered 2020-03-31: 3 [IU] via SUBCUTANEOUS
  Administered 2020-03-31 (×3): 4 [IU] via SUBCUTANEOUS
  Administered 2020-03-31: 7 [IU] via SUBCUTANEOUS
  Administered 2020-03-31: 4 [IU] via SUBCUTANEOUS
  Administered 2020-04-01 – 2020-04-03 (×6): 3 [IU] via SUBCUTANEOUS

## 2020-03-29 NOTE — Progress Notes (Signed)
PHARMACY - TOTAL PARENTERAL NUTRITION CONSULT NOTE  Indication: Chylothorax  Patient Measurements: Height: 5\' 11"  (180.3 cm) Weight: 85.8 kg (189 lb 2.5 oz) IBW/kg (Calculated) : 75.3 TPN AdjBW (KG): 81.4 Body mass index is 26.38 kg/m. Usual Weight: 118-122 kg prior to cancer dx, now 85.8 kg  Assessment:  74 YOM with esophageal cancer presented on 02/24/20 for esophagectomy and J-tube placement.  Patient was started on TF post-op and was briefly trialed on a clear liquid diet.  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 Patient underwent lymphangiogram and thoracic duct fenestration on 03/23/20 - unable to cannulate the thoracic duct. Pharmacy consulted to manage TPN to allow for healing as low-fat, MCT TF formula has not been successful.  Glucose / Insulin: no hx DM - CBGs trend up to mid 200s due to starting Decadron 10mg  IV q8h x 3 days on 11/23 PM. Required 20 units SSI in last 24hrs.  Electrolytes: Na 131>132, K 4.2>4.6, Phos low 2.3, others stable WNL Renal: Scr 0.48 stable, BUN WNL. Off Lasix IV on 11/17 LFTs / TGs: LFTs normalized. Tbili / TG WNL Prealbumin / albumin: prealbumin 15.4, albumin 1.8, LBM 11/22 Intake / Output; MIVF: UOP 0.5 ml/kg/hr, chest tube output 1541ml, net -14L this admit GI Imaging: none since TPN  Surgeries / Procedures: none since TPN  Central access: PICC placed 03/24/20 TPN start date: 03/24/20  Nutritional Goals (per RD updated recommendations 11/23): 2800-3000 kCal, 190-210g protein, >/=2.2L fluid per day  Current Nutrition:  Clear liquid diet TPN  Plan:  Continue TPN at goal rate of 130 ml/hr (recalculated 11/23 per updated RD recommendations).  TPN will provide 190g AA, 374g CHO and 87g SMOF lipids, for a total 2908 kCal, meeting 100% of patient needs. Electrolytes in TPN: reduce K slightly to 12 mEq/L and therefore must reduce Phos to 8 mmol/L due national  shortage of NaPhos; otherwise, continue same today - Na 125 mEq/L, Mag 12 mEq/L, Ca 1.5 mEq/L, max CL Give K Phos Neutral 250mg  PT q8h x 3 doses Add standard MVI and trace elements to TPN Change to resistant SSI Q4H + add 10 units regular insulin to TPN and adjust as needed (watch closely on IV steroids x 3 days) Monitor TPN labs, chest tube output, I/O's F/u IR plans - considering repeat attempt at thoracic duct embolization if chylous effusion persists >1 week   Arturo Morton, PharmD, BCPS Please check AMION for all [place] contact numbers Clinical Pharmacist 03/29/2020 7:53 AM

## 2020-03-29 NOTE — Progress Notes (Signed)
SRN asked if patient would like to ambulate again. Patient declined. SRN reiterated that today's ambulation was great progress, baby steps are key, and that doing so multiple times daily would prevent muscle loss. Pt then asserted his opinion that walking isn't doing anything, that he isn't getting better, and he needs to be receiving care from another facility. SRN acknowledged pt's feelings by stating that "I'm sorry that you're frustrated.".

## 2020-03-29 NOTE — Progress Notes (Signed)
Physical Therapy Treatment Patient Details Name: Brett Dawson MRN: [id number] DOB: [birth year] Today's Date: 03/29/2020    History of Present Illness 64 yo male with Esophageal cancer. s/p 02/24/20 robotic assisted Ivor-Lewis esophagectomy with jejunostomy tube placement. Pt with increasing shortness of breath and fever with chest tubes placed x 2 (11/1 and 11/2)    PT Comments    Pt seated in recliner on arrival.  Pt initially refused gt training this session but nurse entered and he changed his mind and agreeable to participate.  Pt performed gt with rollator to conserve energy but continues to benefit from multiple walks per day.  Plan for HHPT remains appropriate.     Follow Up Recommendations  Home health PT;Supervision/Assistance - 24 hour     Equipment Recommendations  None recommended by PT    Recommendations for Other Services       Precautions / Restrictions Precautions Precautions: Fall Precaution Comments: chest tube, gtube Restrictions Weight Bearing Restrictions: No    Mobility  Bed Mobility Overal bed mobility: Needs Assistance Bed Mobility: Supine to Sit;Sit to Supine     Supine to sit: Supervision Sit to supine: Supervision   General bed mobility comments: supervision for lines  Transfers Overall transfer level: Needs assistance Equipment used: 4-wheeled walker Transfers: Sit to/from Stand Sit to Stand: Min guard         General transfer comment: Cues for hand placement to and from seated surface  Demonstrated how to lock and unlock brakes on rollator for safety.  Ambulation/Gait Ambulation/Gait assistance: Min guard Gait Distance (Feet): 80 Feet Assistive device: 4-wheeled walker Gait Pattern/deviations: Step-through pattern;Shuffle;Narrow base of support;Trunk flexed Gait velocity: slowed   General Gait Details: Pt on 6L [place] 88%-92% during ambulation.  Pt required max cues for encouragement this session.  Pt with flexed head and posture.   Cues for scap retraction and upper trunk control.  Pt fatigues quickly.   Stairs             Wheelchair Mobility    Modified Rankin (Stroke Patients Only)       Balance Overall balance assessment: Needs assistance Sitting-balance support: Feet supported;No upper extremity supported Sitting balance-Leahy Scale: Good       Standing balance-Leahy Scale: Poor                              Cognition Arousal/Alertness: Awake/alert Behavior During Therapy: Flat affect Overall Cognitive Status: Within Functional Limits for tasks assessed                                 General Comments: pt with self limiting behaviors      Exercises      General Comments        Pertinent Vitals/Pain Pain Assessment: No/denies pain Faces Pain Scale: Hurts little more Pain Location: back pain Pain Descriptors / Indicators: Guarding Pain Intervention(s): Monitored during session;Repositioned    Home Living                      Prior Function            PT Goals (current goals can now be found in the care plan section) Acute Rehab PT Goals Patient Stated Goal: to be I again Potential to Achieve Goals: Fair Progress towards PT goals: Progressing toward goals    Frequency    Min  3X/week      PT Plan Current plan remains appropriate    Co-evaluation              AM-PAC PT "6 Clicks" Mobility   Outcome Measure  Help needed turning from your back to your side while in a flat bed without using bedrails?: None Help needed moving from lying on your back to sitting on the side of a flat bed without using bedrails?: A Little Help needed moving to and from a bed to a chair (including a wheelchair)?: A Little Help needed standing up from a chair using your arms (e.g., wheelchair or bedside chair)?: A Little Help needed to walk in hospital room?: A Little Help needed climbing 3-5 steps with a railing? : A Lot 6 Click Score: 18     End of Session Equipment Utilized During Treatment: Oxygen Activity Tolerance: Other (comment) (self limiting this session but with encouragement from nursing he participated.) Patient left: in chair;with call bell/phone within reach Nurse Communication: Mobility status PT Visit Diagnosis: Other abnormalities of gait and mobility (R26.89)     Time: 5784-6962 PT Time Calculation (min) (ACUTE ONLY): 23 min  Charges:  $Gait Training: 8-22 mins $Therapeutic Activity: 8-22 mins                     Bonney Leitz , PTA Acute Rehabilitation Services Pager [phone] Office [phone]     [name] Delay 03/29/2020, 3:49 PM

## 2020-03-29 NOTE — Progress Notes (Signed)
Occupational Therapy Treatment Patient Details Name: Brett Dawson MRN: [id number] DOB: Aug 06, 1955 Today's Date: 03/29/2020    History of present illness 64 yo male with Esophageal cancer. s/p 02/24/20 robotic assisted Ivor-Lewis esophagectomy with jejunostomy tube placement. Pt with increasing shortness of breath and fever with chest tubes placed x 2 (11/1 and 11/2)   OT comments  Pt with plans for OOB this afternoon, pt stating he is short of breath with activity. Educated in benefits and encouraged ambulation and up to chair as VS are closely monitored. Focus of session on seated grooming/nail care.   Follow Up Recommendations  Home health OT;Supervision/Assistance - 24 hour    Equipment Recommendations  3 in 1 bedside commode    Recommendations for Other Services      Precautions / Restrictions Precautions Precautions: Fall Precaution Comments: chest tube, gtube Restrictions Weight Bearing Restrictions: No       Mobility Bed Mobility Overal bed mobility: Needs Assistance Bed Mobility: Supine to Sit;Sit to Supine     Supine to sit: Supervision Sit to supine: Supervision   General bed mobility comments: supervision for lines  Transfers                 General transfer comment: declined, plans to get OOB later today    Balance Overall balance assessment: Needs assistance   Sitting balance-Leahy Scale: Good                                     ADL either performed or assessed with clinical judgement   ADL Overall ADL's : Needs assistance/impaired     Grooming: Wash/dry hands;Sitting;Set up Grooming Details (indicate cue type and reason): clipped pt's fingernails                               General ADL Comments: Pt states he is planning to get OOB, bathe and stay up in chair at 1 pm today     Vision       Perception     Praxis      Cognition Arousal/Alertness: Awake/alert Behavior During Therapy: Flat  affect Overall Cognitive Status: Within Functional Limits for tasks assessed                                 General Comments: pt with self limiting behaviors        Exercises     Shoulder Instructions       General Comments      Pertinent Vitals/ Pain       Pain Assessment: No/denies pain  Home Living                                          Prior Functioning/Environment              Frequency  Min 2X/week        Progress Toward Goals  OT Goals(current goals can now be found in the care plan section)  Progress towards OT goals: Progressing toward goals  Acute Rehab OT Goals Patient Stated Goal: to be I again OT Goal Formulation: With patient Time For Goal Achievement: 04/10/20 Potential to Achieve Goals: Good  Plan Discharge plan  remains appropriate    Co-evaluation                 AM-PAC OT "6 Clicks" Daily Activity     Outcome Measure   Help from another person eating meals?: None Help from another person taking care of personal grooming?: A Lot Help from another person toileting, which includes using toliet, bedpan, or urinal?: A Little Help from another person bathing (including washing, rinsing, drying)?: A Little Help from another person to put on and taking off regular upper body clothing?: A Little Help from another person to put on and taking off regular lower body clothing?: A Little 6 Click Score: 18    End of Session Equipment Utilized During Treatment: Oxygen  OT Visit Diagnosis: Muscle weakness (generalized) (M62.81)   Activity Tolerance Patient tolerated treatment well   Patient Left in bed;with call bell/phone within reach   Nurse Communication          Time: 3846-6599 OT Time Calculation (min): 18 min  Charges: OT General Charges $OT Visit: 1 Visit OT Treatments $Self Care/Home Management : 8-22 mins  Nestor Lewandowsky, OTR/L Acute Rehabilitation Services Pager:  [phone] Office: [phone]   Malka So 03/29/2020, 12:32 PM

## 2020-03-29 NOTE — Progress Notes (Addendum)
      [address]       [place] [address]             [phone]      Subjective:  Mr. Kimball continue to feel frustrated with the lack of progress. He has talked to friends and family members and has been encouraged by them to request a second opinion regarding his treatment and wants to consider transfer to another facility.  I explained that Dr. Kipp Brood had conferred with the interventional radiologist and they had determined the most appropriate course was to support him nutritionally with TPN for a week befor considering re-attempt of lymphangiogram and embolization of the thoracic duct.   He feels he is breathing easier with less congestion.   The left pleural tube drained 1469ml past 24 hours.   TPN infusing per PICC.    Objective: Vital signs in last 24 hours: Temp:  [97.7 F (36.5 C)-99.7 F (37.6 C)] 97.7 F (36.5 C) (11/24 0409) Pulse Rate:  [78-97] 78 (11/24 0409) Cardiac Rhythm: Normal sinus rhythm;Bundle branch block (11/24 0715) Resp:  [17-20] 19 (11/24 0409) BP: (106-119)/(62-73) 119/67 (11/24 0409) SpO2:  [92 %-93 %] 93 % (11/24 0409) Weight:  [85.8 kg] 85.8 kg (11/24 8144)     Intake/Output from previous day: 11/23 0701 - 11/24 0700 In: 144.5 [I.V.:144.5] Out: 2475 [Urine:1075; Chest Tube:1400] Intake/Output this shift:  General appearance: alert, cooperative and mild distress Heart: regular rate and rhythm Lungs: The breath sounds are clear. The left pleural drain had about 1476ml  milky fluid past 24 hours.  Abdomen: soft, bowel sounds normal; Minimal inflammation at J-tube exit, dressing is dry.. Extremities: no peripheral edema Wound: port incisions have healed.   Lab Results: Recent Labs    03/27/20 0430  WBC 10.1  HGB 9.2*  HCT 28.7*  PLT 419*   BMET:  Recent Labs    03/28/20 1056 03/29/20 0530  NA 131* 132*  K 4.2 4.6  CL 98 102  CO2 25 24  GLUCOSE 136* 246*  BUN 13 17  CREATININE 0.45* 0.48*  CALCIUM  8.1* 8.4*    PT/INR:  No results for input(s): LABPROT, INR in the last 72 hours. 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 Follow up esophograms have shown no anastomotic leak.   -Chylous left pleural effusion- Transitioned to TPN on 11/21. Case discussed with IR by Dr Kipp Brood. Plan to continue nutritional support with TPN for 1 week then re-attempt the lymphangiogram / embolization of thoracic duct.   -Hyponatremia-improving, monitor  -ID-Leukocytosis resolved, no further fevers.  -Renal- stable function.   -DVT PPX- on SQ enoxaparin daily.    [name], PA-C [phone]  Agree with above Will attempt embolization with IR early next week.  Continue TPN for now Continue CT drainage for now.  Charvez Voorhies [name]

## 2020-03-29 NOTE — Patient Outreach (Addendum)
Brett Dawson [facility]) Care Management  03/29/2020  Brett Dawson 1955/09/03 [id number]   [place] coordination- hospitialized  Mr Brett Dawson remains hospitalized LOS 56  Pt with scheduled admission for robotic assisted Ivor-Lewis esophagectomy with jejunostomy tube placement by Dr Kipp Brood- 02/24/20 Respiratory issues, tolerating 5 L [place], CXR with left pleural effusions vs atelectasis, chest tubes, clear liquid diet, nocturnal tube feeds (J tube), no recent fevers, continue Zosyn Continues to have copious drainage from chest tube   Plan [facility] RN CM willfollow up withpatient after updated by [facility] hospital liaison of discharge collaborated with [facility] hospital liaison   Ducor L. Lavina Hamman, RN, BSN, Brett Coordinator Office number [phone] Mobile number [phone]  Main [facility] number [phone] Fax number [phone]

## 2020-03-30 LAB — COMPREHENSIVE METABOLIC PANEL
ALT: 32 U/L (ref 0–44)
AST: 18 U/L (ref 15–41)
Albumin: 1.8 g/dL — ABNORMAL LOW (ref 3.5–5.0)
Alkaline Phosphatase: 122 U/L (ref 38–126)
Anion gap: 8 (ref 5–15)
BUN: 21 mg/dL (ref 8–23)
CO2: 22 mmol/L (ref 22–32)
Calcium: 8.5 mg/dL — ABNORMAL LOW (ref 8.9–10.3)
Chloride: 104 mmol/L (ref 98–111)
Creatinine, Ser: 0.36 mg/dL — ABNORMAL LOW (ref 0.61–1.24)
GFR, Estimated: >60 mL/min (ref >60)
Glucose, Bld: 241 mg/dL — ABNORMAL HIGH (ref 70–99)
Potassium: 4.4 mmol/L (ref 3.5–5.1)
Sodium: 134 mmol/L — ABNORMAL LOW (ref 135–145)
Total Bilirubin: 0.2 mg/dL — ABNORMAL LOW (ref 0.3–1.2)
Total Protein: 6.3 g/dL — ABNORMAL LOW (ref 6.5–8.1)

## 2020-03-30 LAB — GLUCOSE, CAPILLARY
Glucose-Capillary: 239 mg/dL — ABNORMAL HIGH (ref 70–99)
Glucose-Capillary: 211 mg/dL — ABNORMAL HIGH (ref 70–99)
Glucose-Capillary: 164 mg/dL — ABNORMAL HIGH (ref 70–99)
Glucose-Capillary: 211 mg/dL — ABNORMAL HIGH (ref 70–99)
Glucose-Capillary: 233 mg/dL — ABNORMAL HIGH (ref 70–99)
Glucose-Capillary: 190 mg/dL — ABNORMAL HIGH (ref 70–99)

## 2020-03-30 LAB — MAGNESIUM: Magnesium: 2.1 mg/dL (ref 1.7–2.4)

## 2020-03-30 LAB — PHOSPHORUS: Phosphorus: 3.3 mg/dL (ref 2.5–4.6)

## 2020-03-30 MED ORDER — TRAVASOL 10 % IV SOLN
INTRAVENOUS | Status: AC
  Filled 2020-03-30: qty 1903.2

## 2020-03-30 NOTE — Progress Notes (Signed)
      [address]       [place] [address]             [phone]      7 Days Post-Op Procedure(s) (LRB): Malignant neoplasm of esophagus (N/A)   Subjective:  Patient again agitated and frustrated this morning.  He again states he can't breath.  He states he wants transferred to another facility. He states he is tired of laying here and suffering and no one is doing anything.  He wants to go home and gain his weight back and get stronger.  I explained to the patient that transfer wouldn't be possible today, but I will speak with Dr. Kipp Brood tomorrow about trying to get him transferred.  Objective: Vital signs in last 24 hours: Temp:  [97.5 F (36.4 C)-97.9 F (36.6 C)] 97.6 F (36.4 C) (11/25 0742) Pulse Rate:  [70-96] 70 (11/25 0742) Cardiac Rhythm: Normal sinus rhythm (11/25 0702) Resp:  [17-23] 20 (11/25 0742) BP: (118-130)/(71-79) 130/74 (11/25 0742) SpO2:  [86 %-91 %] 90 % (11/25 0742) Weight:  [85.7 kg] 85.7 kg (11/25 0353)  Intake/Output from previous day: 11/24 0701 - 11/25 0700 In: 89.9 [I.V.:89.9] Out: 2500 [Urine:1075; Chest Tube:1425] Intake/Output this shift: Total I/O In: 120 [P.O.:120] Out: 250 [Urine:250]  General appearance: alert, cooperative and no distress Heart: regular rate and rhythm Lungs: clear to auscultation bilaterally Abdomen: soft, non-tender; bowel sounds normal; no masses,  no organomegaly Extremities: extremities normal, atraumatic, no cyanosis or edema Wound: clean and dry  Lab Results: No results for input(s): WBC, HGB, HCT, PLT in the last 72 hours. BMET: Recent Labs    03/29/20 0530 03/30/20 0210  NA 132* 134*  K 4.6 4.4  CL 102 104  CO2 24 22  GLUCOSE 246* 241*  BUN 17 21  CREATININE 0.48* 0.36*  CALCIUM 8.4* 8.5*    PT/INR: No results for input(s): LABPROT, INR in the last 72 hours. ABG    Component Value Date/Time   PHART 7.454 (H) 02/25/2020 0500   HCO3 26.5 02/25/2020 0500   TCO2 27  02/24/2020 1944   ACIDBASEDEF 1.0 02/24/2020 1547   O2SAT 93.7 02/25/2020 0500   CBG (last 3)  Recent Labs    03/29/20 2346 03/30/20 0355 03/30/20 0741  GLUCAP 198* 239* 211*    Assessment/Plan: S/P Procedure(s) (LRB): Malignant neoplasm of esophagus (N/A)  1. 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 however patient doesn't wish to wait that long and asks if the staff will even bother to show up, he wants to be transferred to a facility to get this fixed 2. GI- liquid diet, on TPN will continue 3. Hyponatremia- resolved 4. Lovenox for DVT prophylaxis 5. Dispo- patient stable, same complaints persist, he remains agitated and frustrated about his progress and care.. wants transferred to another facility to get his problem fixed so he may get home, Dr. Kipp Brood back tomorrow and can discuss care plan and wishes to transfer   LOS: 35 days    Ellwood Handler, PA-C 03/30/2020

## 2020-03-30 NOTE — Progress Notes (Signed)
PHARMACY - TOTAL PARENTERAL NUTRITION CONSULT NOTE  Indication: Chylothorax  Patient Measurements: Height: 5\' 11"  (180.3 cm) Weight: 85.7 kg (188 lb 15 oz) IBW/kg (Calculated) : 75.3 TPN AdjBW (KG): 81.4 Body mass index is 26.35 kg/m. Usual Weight: 118-122 kg prior to cancer dx, now 85.8 kg  Assessment:  45 YOM with esophageal cancer presented on 02/24/20 for esophagectomy and J-tube placement.  Patient was started on TF post-op and was briefly trialed on a clear liquid diet.  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 Patient underwent lymphangiogram and thoracic duct fenestration on 03/23/20 - unable to cannulate the thoracic duct. Pharmacy consulted to manage TPN to allow for healing as low-fat, MCT TF formula has not been successful.  Glucose / Insulin: no hx DM - CBGs 198-240 due to starting Decadron 10mg  IV q8h x 3 days on 11/23 PM. Required 10 units regular insulin in TPN + 32 units SSI in last 24hrs.  Electrolytes: Na 132>134, Phos normalized - up to 3.3 (s/p K Phos Neurtral PT x 3 doses yesterday), others stable WNL Renal: Scr 0.36 stable, BUN WNL. Off Lasix IV on 11/17 LFTs / TGs: LFTs normalized. Tbili / TG WNL Prealbumin / albumin: prealbumin 15.4, albumin 1.8, LBM 11/23 Intake / Output; MIVF: UOP 0.5 ml/kg/hr, chest tube output 1654ml, net -17.5L this admit GI Imaging: none since TPN  Surgeries / Procedures: none since TPN  Central access: PICC placed 03/24/20 TPN start date: 03/24/20  Nutritional Goals (per RD updated recommendations 11/23): 2800-3000 kCal, 190-210g protein, >/=2.2L fluid per day  Current Nutrition:  Clear liquid diet TPN  Plan:  Continue TPN at goal rate of 130 ml/hr (recalculated 11/23 per updated RD recommendations).  TPN will provide 190g AA, 374g CHO and 87g SMOF lipids, for a total 2908 kCal, meeting 100% of patient needs. Electrolytes in TPN: continue same today -  K reduced to 12 mEq/L on 11/24 and therefore had to reduce Phos to 8 mmol/L due national shortage of NaPhos; Na 125 mEq/L, Mag 12 mEq/L, Ca 1.5 mEq/L, max CL Add standard MVI and trace elements to TPN Continue resistant SSI Q4H + increase to 25 units regular insulin added to TPN and adjust as needed (watch closely on IV steroids x 3 days) Monitor TPN labs, chest tube output, I/O's IR planning repeat attempt early next week at thoracic duct embolization if chylous effusion persists   Arturo Morton, PharmD, BCPS Please check AMION for all [place] contact numbers Clinical Pharmacist 03/30/2020 7:43 AM

## 2020-03-31 LAB — COMPREHENSIVE METABOLIC PANEL
ALT: 29 U/L (ref 0–44)
AST: 18 U/L (ref 15–41)
Albumin: 1.8 g/dL — ABNORMAL LOW (ref 3.5–5.0)
Alkaline Phosphatase: 125 U/L (ref 38–126)
Anion gap: 7 (ref 5–15)
BUN: 23 mg/dL (ref 8–23)
CO2: 20 mmol/L — ABNORMAL LOW (ref 22–32)
Calcium: 8.5 mg/dL — ABNORMAL LOW (ref 8.9–10.3)
Chloride: 108 mmol/L (ref 98–111)
Creatinine, Ser: 0.47 mg/dL — ABNORMAL LOW (ref 0.61–1.24)
GFR, Estimated: >60 mL/min (ref >60)
Glucose, Bld: 186 mg/dL — ABNORMAL HIGH (ref 70–99)
Potassium: 4.5 mmol/L (ref 3.5–5.1)
Sodium: 135 mmol/L (ref 135–145)
Total Bilirubin: 0.2 mg/dL — ABNORMAL LOW (ref 0.3–1.2)
Total Protein: 5.8 g/dL — ABNORMAL LOW (ref 6.5–8.1)

## 2020-03-31 LAB — MAGNESIUM: Magnesium: 2 mg/dL (ref 1.7–2.4)

## 2020-03-31 LAB — PHOSPHORUS: Phosphorus: 3.6 mg/dL (ref 2.5–4.6)

## 2020-03-31 LAB — GLUCOSE, CAPILLARY
Glucose-Capillary: 145 mg/dL — ABNORMAL HIGH (ref 70–99)
Glucose-Capillary: 166 mg/dL — ABNORMAL HIGH (ref 70–99)
Glucose-Capillary: 167 mg/dL — ABNORMAL HIGH (ref 70–99)
Glucose-Capillary: 172 mg/dL — ABNORMAL HIGH (ref 70–99)
Glucose-Capillary: 199 mg/dL — ABNORMAL HIGH (ref 70–99)
Glucose-Capillary: 203 mg/dL — ABNORMAL HIGH (ref 70–99)

## 2020-03-31 MED ORDER — TRAVASOL 10 % IV SOLN
INTRAVENOUS | Status: AC
  Filled 2020-03-31: qty 1903.2

## 2020-03-31 NOTE — Progress Notes (Signed)
Physical Therapy Treatment Patient Details Name: Brett Dawson MRN: [id number] DOB: 12-04-1955 Today's Date: 03/31/2020    History of Present Illness 64 yo male with Esophageal cancer. s/p 02/24/20 robotic assisted Ivor-Lewis esophagectomy with jejunostomy tube placement. Pt with increasing shortness of breath and fever with chest tubes placed x 2 (11/1 and 11/2)    PT Comments    Pt supine in bed on arrival.  Pt motivated to get OOB and participate with PT so he could go outside after.  Pt very limited and SPO2 in mid to high 80s during session.  He remains limited due to dyspnea and weakness.  Mild LOB noted during session when backing to WC.  He was encouraged post session to make a conscious efforts to participate in ambulation atleast 2-3x daily.  He reports in response," Absolutely not. It aint happening."  Cont to recommend HHPT.     Follow Up Recommendations  Home health PT;Supervision/Assistance - 24 hour     Equipment Recommendations  None recommended by PT    Recommendations for Other Services       Precautions / Restrictions Precautions Precautions: Fall Precaution Comments: chest tube, gtube Restrictions Weight Bearing Restrictions: No    Mobility  Bed Mobility Overal bed mobility: Needs Assistance Bed Mobility: Supine to Sit     Supine to sit: Supervision;HOB elevated     General bed mobility comments: supervision for lines  Transfers Overall transfer level: Needs assistance Equipment used: 4-wheeled walker Transfers: Sit to/from Stand Sit to Stand: Min assist         General transfer comment: Min assistance, performed sit to stand x 2, limited due to dysnea.  Min guard to maintain balance in standing.  Pt very fatigued moving into transport chair.  He could not problem solve to sit on the side and had mild LOB required min assistance and step by step cues to sit in transport chair.  Ambulation/Gait Ambulation/Gait assistance: Min guard Gait Distance  (Feet): 60 Feet (limted as patient very anxious to sit down in WC at end of gt trial.) Assistive device: 4-wheeled walker Gait Pattern/deviations: Step-through pattern;Shuffle;Narrow base of support;Trunk flexed     General Gait Details: Pt on 8L  85%-89% during ambulation.  Pt required max cues for encouragement this session.  Pt fatigues quickly and continues to present with flexed posture.   Stairs             Wheelchair Mobility    Modified Rankin (Stroke Patients Only)       Balance Overall balance assessment: Needs assistance Sitting-balance support: Feet supported;No upper extremity supported Sitting balance-Leahy Scale: Good       Standing balance-Leahy Scale: Poor                              Cognition Arousal/Alertness: Awake/alert Behavior During Therapy: Flat affect Overall Cognitive Status: Within Functional Limits for tasks assessed                                 General Comments: pt with self limiting behaviors      Exercises      General Comments        Pertinent Vitals/Pain Pain Assessment: Faces Pain Score: 4  Pain Location: back pain Pain Descriptors / Indicators: Guarding Pain Intervention(s): Monitored during session    Home Living  Prior Function            PT Goals (current goals can now be found in the care plan section) Acute Rehab PT Goals Patient Stated Goal: I want to go outside. Potential to Achieve Goals: Fair Progress towards PT goals: Progressing toward goals    Frequency    Min 3X/week      PT Plan Current plan remains appropriate    Co-evaluation              AM-PAC PT "6 Clicks" Mobility   Outcome Measure  Help needed turning from your back to your side while in a flat bed without using bedrails?: None Help needed moving from lying on your back to sitting on the side of a flat bed without using bedrails?: A Little Help needed moving to  and from a bed to a chair (including a wheelchair)?: A Little Help needed standing up from a chair using your arms (e.g., wheelchair or bedside chair)?: A Little Help needed to walk in hospital room?: A Little Help needed climbing 3-5 steps with a railing? : A Lot 6 Click Score: 18    End of Session Equipment Utilized During Treatment: Oxygen Activity Tolerance: Other (comment) (pt continues to self limit due to anxiety associated with dyspnea.) Patient left: in chair;with call bell/phone within reach Nurse Communication: Mobility status PT Visit Diagnosis: Other abnormalities of gait and mobility (R26.89)     Time: 1610-9604 PT Time Calculation (min) (ACUTE ONLY): 33 min  Charges:  $Gait Training: 8-22 mins $Therapeutic Activity: 8-22 mins                     Brett Dawson , PTA Acute Rehabilitation Services Pager [phone] Office [phone]     Brett Dawson 03/31/2020, 4:02 PM

## 2020-03-31 NOTE — Progress Notes (Signed)
Occupational Therapy Treatment Patient Details Name: Brett Dawson MRN: [id number] DOB: 01-14-1956 Today's Date: 03/31/2020    History of present illness 64 yo male with Esophageal cancer. s/p 02/24/20 robotic assisted Ivor-Lewis esophagectomy with jejunostomy tube placement. Pt with increasing shortness of breath and fever with chest tubes placed x 2 (11/1 and 11/2)   OT comments  Patient continues to present with declines to activity tolerance, dyspnea on exertion, poor stand balance, which impact ADL and mobility status.  He has had a long hospitalization and is progressing slowly.  OT will continue to follow in the acute setting.  HH OT has been recommended, but SNF may be needed given declines.    Follow Up Recommendations  Home health OT;Supervision/Assistance - 24 hour    Equipment Recommendations  3 in 1 bedside commode    Recommendations for Other Services      Precautions / Restrictions Precautions Precautions: Fall Precaution Comments: chest tube, gtube       Mobility Bed Mobility   Bed Mobility: Supine to Sit     Supine to sit: Supervision;HOB elevated        Transfers Overall transfer level: Needs assistance   Transfers: Sit to/from Stand;Stand Pivot Transfers Sit to Stand: Supervision Stand pivot transfers: Supervision       General transfer comment: limited by lines and leads.    Balance   Sitting-balance support: Feet supported;No upper extremity supported Sitting balance-Leahy Scale: Good     Standing balance support: Bilateral upper extremity supported Standing balance-Leahy Scale: Poor                             ADL either performed or assessed with clinical judgement   ADL       Grooming: Wash/dry hands;Sitting;Set up                   Toilet Transfer: Supervision/safety;Squat-pivot   Toileting- Clothing Manipulation and Hygiene: Maximal assistance;Sit to/from stand       Functional mobility during ADLs:  Supervision/safety General ADL Comments: steps from [facility] to recliner.  Reaching for arm rests and objects in room for stability.                                                                                    Patient on 3 L upon entering.  After BSC usage and transfer to recliner, patient stayed around 84% - bumped to 3.5L with longer cord.  Nursing in room and reqested to bump up a litte more.  Patient left on 4 L.  Patient left with nursing.    Pertinent Vitals/ Pain       Pain Assessment: No/denies pain Pain Intervention(s): Monitored during session                                                          Frequency  Min 2X/week        Progress Toward Goals  OT  Goals(current goals can now be found in the care plan section)  Progress towards OT goals: Progressing toward goals  Acute Rehab OT Goals Patient Stated Goal: I just need to breath better. OT Goal Formulation: With patient Time For Goal Achievement: 04/10/20 Potential to Achieve Goals: Good  Plan Discharge plan remains appropriate    Co-evaluation                 AM-PAC OT "6 Clicks" Daily Activity     Outcome Measure   Help from another person eating meals?: None Help from another person taking care of personal grooming?: A Little Help from another person toileting, which includes using toliet, bedpan, or urinal?: A Lot Help from another person bathing (including washing, rinsing, drying)?: A Little Help from another person to put on and taking off regular upper body clothing?: A Little Help from another person to put on and taking off regular lower body clothing?: A Little 6 Click Score: 18    End of Session Equipment Utilized During Treatment: Oxygen  OT Visit Diagnosis: Muscle weakness (generalized) (M62.81)   Activity Tolerance Patient limited by fatigue   Patient Left in chair;with call bell/phone within reach;with nursing/sitter  in room   Nurse Communication Other (comment) (O2 status)        Time: 3762-8315 OT Time Calculation (min): 25 min  Charges: OT General Charges $OT Visit: 1 Visit OT Treatments $Self Care/Home Management : 23-37 mins  03/31/2020  Brett Dawson, OTR/L  Acute Rehabilitation Services  Office:  [phone]    Metta Clines 03/31/2020, 9:38 AM

## 2020-03-31 NOTE — Plan of Care (Signed)

## 2020-03-31 NOTE — Progress Notes (Signed)
PHARMACY - TOTAL PARENTERAL NUTRITION CONSULT NOTE  Indication: Chylothorax  Patient Measurements: Height: 5\' 11"  (180.3 cm) Weight:  (pt refusing to stand for weight at this time) IBW/kg (Calculated) : 75.3 TPN AdjBW (KG): 81.4 Body mass index is 26.35 kg/m. Usual Weight: 118-122 kg prior to cancer dx, now 85.8 kg  Assessment:  25 YOM with esophageal cancer presented on 02/24/20 for esophagectomy and J-tube placement.  Patient was started on TF post-op and was briefly trialed on a clear liquid diet.  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 Patient underwent lymphangiogram and thoracic duct fenestration on 03/23/20 - unable to cannulate the thoracic duct. Pharmacy consulted to manage TPN to allow for healing as low-fat, MCT TF formula has not been successful.  Glucose / Insulin: no hx DM - CBGs 186-199 due to starting Decadron 10mg  IV q8h x 3 days on 11/23 PM. Required 25 units regular insulin in TPN + 26 units SSI in last 24hrs.  Electrolytes: Na 134>135, CO2 20, others stable WNL Renal: Scr 0.47 stable, BUN WNL. Off Lasix IV on 11/17 LFTs / TGs: LFTs normalized. Tbili / TG WNL Prealbumin / albumin: prealbumin 15.4, albumin 1.8, LBM 11/23 Intake / Output; MIVF: UOP 0.7 ml/kg/hr, chest tube output 1963ml, net -13.8L this admit; LBM 11/23 GI Imaging: none since TPN  Surgeries / Procedures: none since TPN  Central access: PICC placed 03/24/20 TPN start date: 03/24/20  Nutritional Goals (per RD updated recommendations 11/23): 2800-3000 kCal, 190-210g protein, >/=2.2L fluid per day  Current Nutrition:  Clear liquid diet (charted as "fair" with intake 25-30% of meals) TPN  Plan:  Continue TPN at goal rate of 130 ml/hr (recalculated 11/23 per updated RD recommendations).  TPN will provide 190g AA, 374g CHO and 87g SMOF lipids, for a total 2908 kCal, meeting 100% of patient needs. Electrolytes in TPN:  change Cl:Ac to 2:1; otherwise, continue same today - K reduced to 12 mEq/L on 11/24 and therefore had to reduce Phos to 8 mmol/L due national shortage of NaPhos; Na 125 mEq/L, Mag 12 mEq/L, Ca 1.5 mEq/L Add standard MVI and trace elements to TPN Continue resistant SSI Q4H + increase to 35 units regular insulin added to TPN and adjust as needed (watch closely on IV steroids x 3 days) Monitor TPN labs, chest tube output, I/O's IR planning repeat attempt early next week at thoracic duct embolization if chylous effusion persists  Decadron 10mg  IV q8h scheduled to d/c 11/27 after AM dose - likely should remove insulin from TPN bag scheduled to be hung 11/27 at 1800 and monitor, as patient did not require insulin in TPN prior to steroids being initiated.   Arturo Morton, PharmD, BCPS Please check AMION for all [place] contact numbers Clinical Pharmacist 03/31/2020 8:00 AM

## 2020-03-31 NOTE — Progress Notes (Signed)
Referring Physician(s): * No referring provider recorded for this case *  Supervising Physician: Dr. Serafina Royals  Patient Status:  [facility] - In-pt  Chief Complaint: Chylothorax  Subjective: Frustrated with length of stay and failure of prior procedures to successfully treat his chylothorax.  Discussed possibility of moving forward with another attempt next week.    Allergies: Patient has no known allergies.  Medications: Prior to Admission medications   Medication Sig Start Date End Date Taking? Authorizing Provider  Colchicine 0.6 MG CAPS Take 0.6 mg by mouth 2 (two) times daily as needed (gout).  11/02/19  Yes [provider]  FEROSUL 325 (65 Fe) MG tablet Take 1 tablet (325 mg total) by mouth daily. Patient not taking: Reported on 02/21/2020 12/13/19   [name], MD  ondansetron (ZOFRAN) 8 MG tablet Take 1 tablet (8 mg total) by mouth 2 (two) times daily as needed for refractory nausea / vomiting. Start on day 3 after chemo. 11/27/19   [name], MD  prochlorperazine (COMPAZINE) 10 MG tablet TAKE 1 TABLET(10 MG) BY MOUTH EVERY 6 HOURS AS NEEDED FOR NAUSEA OR VOMITING Patient taking differently: Take 10 mg by mouth every 6 (six) hours as needed for nausea or vomiting.  12/13/19   [name], MD  sucralfate (CARAFATE) 1 g tablet Take 1 tablet (1 g total) by mouth 4 (four) times daily. Dissolve each tablet in 15 cc water before use. Patient taking differently: Take 1 g by mouth 4 (four) times daily as needed (throat pain). Dissolve each tablet in 15 cc water before use. 12/24/19   Kyung Rudd, MD     Vital Signs: BP 123/85 (BP Location: Left Arm)   Pulse 69   Temp 97.8 F (36.6 C) (Oral)   Resp 18   Ht 5\' 11"  (1.803 m)   Wt 194 lb 0.1 oz (88 kg)   SpO2 93%   BMI 27.06 kg/m   Physical Exam  NAD, alert Chest: left chest tube in place.  1.8 L thick, beige fluid in PleurVac. No air leak.  Imaging: No results found.  Labs:  CBC: Recent Labs    03/23/20 0446  03/24/20 0024 03/25/20 0430 03/27/20 0430  WBC 8.0 10.2 12.7* 10.1  HGB 10.6* 9.5* 9.9* 9.2*  HCT 30.8* 28.2* 30.2* 28.7*  PLT 522* 509* 543* 419*    COAGS: Recent Labs    09/18/19 0808 02/22/20 0830 03/08/20 0015 03/23/20 0446  INR 1.2 1.0 1.1 1.2  APTT  --  30  --   --     BMP: Recent Labs    12/27/19 0810 12/27/19 0810 01/03/20 0813 01/03/20 0813 01/11/20 0814 01/11/20 0814 01/26/20 1426 02/22/20 0830 03/28/20 1056 03/29/20 0530 03/30/20 0210 03/31/20 0026  NA 136   < > 140   < > 139   < > 140   < > 131* 132* 134* 135  K 4.0   < > 3.9   < > 4.2   < > 4.2   < > 4.2 4.6 4.4 4.5  CL 107   < > 106   < > 105   < > 106   < > 98 102 104 108  CO2 23   < > 27   < > 29   < > 29   < > 25 24 22  20*  GLUCOSE 117*   < > 127*   < > 105*   < > 99   < > 136* 246* 241* 186*  BUN  14   < > 12   < > 11   < > 10   < > 13 17 21 23   CALCIUM 9.4   < > 9.4   < > 9.0   < > 8.8*   < > 8.1* 8.4* 8.5* 8.5*  CREATININE 0.66   < > 0.63   < > 0.68   < > 0.71   < > 0.45* 0.48* 0.36* 0.47*  GFRNONAA >60   < > >60   < > >60   < > >60   < > >60 >60 >60 >60  GFRAA >60  --  >60  --  >60  --  >60  --   --   --   --   --    < > = values in this interval not displayed.    LIVER FUNCTION TESTS: Recent Labs    03/27/20 0430 03/28/20 1056 03/30/20 0210 03/31/20 0026  BILITOT 0.5 0.2* 0.2* 0.2*  AST 22 27 18 18   ALT 58* 40 32 29  ALKPHOS 120 121 122 125  PROT 6.3* 6.3* 6.3* 5.8*  ALBUMIN 1.9* 1.8* 1.8* 1.8*    Assessment and Plan: Chylothorax Ongoing increased output from chest tube.  IR attempting to schedule for re-intervention early next week, however limited availability in schedule.  Most likely day is currently Friday. Patient does state several times that he feels he will end up in the OR next week.  IR continues to work towards procedure as earliest date possible. Continue TPN and chest tube management per primary.   Electronically Signed: Docia Barrier,  PA 03/31/2020, 4:05 PM   I spent a total of 15 Minutes at the the patient's bedside AND on the patient's hospital floor or unit, greater than 50% of which was counseling/coordinating care for chylothorax.

## 2020-03-31 NOTE — Progress Notes (Signed)
PT Cancellation Note  Patient Details Name: Brett Dawson MRN: [id number] DOB: 07-25-1955   Cancelled Treatment:    Reason Eval/Treat Not Completed: (P) Patient declined, no reason specified (Pt reports back pain.  PTA educated on importance of mobility for lung function and offered to return later this pm after pain subsides and he has rested.  Pt reports," I don't know." Will f/u per POC.)   Khalil Szczepanik Eli Hose 03/31/2020, 2:22 PM  Erasmo Leventhal , PTA Acute Rehabilitation Services Pager [phone] Office [phone]

## 2020-03-31 NOTE — Progress Notes (Addendum)
      [address]       [place] [address]             [phone]      Subjective:  Brett Dawson continues to feel frustrated with the lack of progress.  He does not want to participate with therapy.   The left pleural tube drained 1617ml past 24 hours.   TPN infusing per PICC.    Objective: Vital signs in last 24 hours: Temp:  [97.5 F (36.4 C)-98.1 F (36.7 C)] 97.8 F (36.6 C) (11/26 0414) Pulse Rate:  [70-80] 75 (11/26 0414) Cardiac Rhythm: Normal sinus rhythm (11/26 0708) Resp:  [17-20] 20 (11/26 0414) BP: (119-137)/(77-97) 135/83 (11/26 0414) SpO2:  [89 %-92 %] 90 % (11/26 0414)     Intake/Output from previous day: 11/25 0701 - 11/26 0700 In: 4149.6 [P.O.:600; I.V.:3549.6] Out: 3150 [Urine:1500; Chest Tube:1650] Intake/Output this shift:  General appearance: alert, cooperative and mild distress Heart: regular rate and rhythm Lungs: The breath sounds are clear. The left pleural drain had about 1473ml  milky fluid past 24 hours.  Abdomen: soft, bowel sounds normal; Minimal inflammation at J-tube exit, dressing is dry.. Extremities: no peripheral edema Wound: port incisions have healed.   Lab Results: No results for input(s): WBC, HGB, HCT, PLT in the last 72 hours. BMET:  Recent Labs    03/30/20 0210 03/31/20 0026  NA 134* 135  K 4.4 4.5  CL 104 108  CO2 22 20*  GLUCOSE 241* 186*  BUN 21 23  CREATININE 0.36* 0.47*  CALCIUM 8.5* 8.5*    PT/INR:  No results for input(s): LABPROT, INR in the last 72 hours. ABG    Component Value Date/Time   PHART 7.454 (H) 02/25/2020 0500   HCO3 26.5 02/25/2020 0500   TCO2 27 02/24/2020 1944   ACIDBASEDEF 1.0 02/24/2020 1547   O2SAT 93.7 02/25/2020 0500   CBG (last 3)  Recent Labs    03/30/20 2253 03/31/20 0408 03/31/20 0811  GLUCAP 190* 199* 203*    Assessment/Plan: S/P Procedure(s) (LRB): Malignant neoplasm of esophagus (N/A)    -POD-36 robotic assisted Ivor-Lewis esophagectomy  for distal esophageal adenocarcinoma. Follow up esophograms have shown no anastomotic leak. Brett Dawson is frustrated with lack of visible progress.    -Chylous left pleural effusion- Continues to have high volume left CT output (1679ml past 24 hours). Transitioned to TPN on 11/21. Had lymphangiogram by IR on 11/18 but cannulation and embolization of the thoracic was not successful.  Case discussed with IR by Dr Kipp Brood. Plan to continue nutritional support with TPN  week then re-attempt the lymphangiogram / embolization of thoracic duct next week.   -Hyponatremia-resolved  -ID-Leukocytosis resolved, no further fevers.  -Renal- stable function.   -DVT PPX- on SQ enoxaparin daily.    [name], PA-C [phone]  CT output remains unchanged IR scheduled for early next week.  If unsuccessful will plan for thoracic duct ligation  Ladesha Pacini O Aliegha Paullin

## 2020-04-01 LAB — COMPREHENSIVE METABOLIC PANEL
ALT: 28 U/L (ref 0–44)
AST: 18 U/L (ref 15–41)
Albumin: 1.8 g/dL — ABNORMAL LOW (ref 3.5–5.0)
Alkaline Phosphatase: 113 U/L (ref 38–126)
Anion gap: 7 (ref 5–15)
BUN: 25 mg/dL — ABNORMAL HIGH (ref 8–23)
CO2: 24 mmol/L (ref 22–32)
Calcium: 8.3 mg/dL — ABNORMAL LOW (ref 8.9–10.3)
Chloride: 104 mmol/L (ref 98–111)
Creatinine, Ser: 0.45 mg/dL — ABNORMAL LOW (ref 0.61–1.24)
GFR, Estimated: >60 mL/min (ref >60)
Glucose, Bld: 124 mg/dL — ABNORMAL HIGH (ref 70–99)
Potassium: 4.2 mmol/L (ref 3.5–5.1)
Sodium: 135 mmol/L (ref 135–145)
Total Bilirubin: 0.6 mg/dL (ref 0.3–1.2)
Total Protein: 5.5 g/dL — ABNORMAL LOW (ref 6.5–8.1)

## 2020-04-01 LAB — GLUCOSE, CAPILLARY
Glucose-Capillary: 108 mg/dL — ABNORMAL HIGH (ref 70–99)
Glucose-Capillary: 117 mg/dL — ABNORMAL HIGH (ref 70–99)
Glucose-Capillary: 118 mg/dL — ABNORMAL HIGH (ref 70–99)
Glucose-Capillary: 119 mg/dL — ABNORMAL HIGH (ref 70–99)
Glucose-Capillary: 140 mg/dL — ABNORMAL HIGH (ref 70–99)
Glucose-Capillary: 149 mg/dL — ABNORMAL HIGH (ref 70–99)

## 2020-04-01 LAB — PHOSPHORUS: Phosphorus: 3.8 mg/dL (ref 2.5–4.6)

## 2020-04-01 LAB — MAGNESIUM: Magnesium: 2.1 mg/dL (ref 1.7–2.4)

## 2020-04-01 MED ORDER — LORAZEPAM 2 MG/ML IJ SOLN
0.5000 mg | Freq: Four times a day (QID) | INTRAMUSCULAR | Status: DC | PRN
  Administered 2020-04-02 – 2020-04-19 (×7): 0.5 mg via INTRAVENOUS
  Filled 2020-04-01 (×8): qty 1

## 2020-04-01 MED ORDER — TRAVASOL 10 % IV SOLN
INTRAVENOUS | Status: AC
  Filled 2020-04-01: qty 1903.2

## 2020-04-01 NOTE — Progress Notes (Signed)
Brett Dawson [address]       [place] [address]             [phone]       9 Days Post-Op Procedure(s) (LRB): Malignant neoplasm of esophagus (N/A)   Subjective:  Patient agitated and frustrated again this morning.  He asks when his procedure will be done he is tired of waiting.  Per the schedule he is on for Friday.  He states this is unacceptable.  He was told by Dr. Kipp Brood it would likely be Monday or Tuesday.  He states he has asked to be transferred for a second opinion and he cant get that.  I explained that I am unable to transfer him to another facility and that must be done by a physician.   Objective: Vital signs in last 24 hours: Temp:  [97.7 F (36.5 C)-97.9 F (36.6 C)] 97.9 F (36.6 C) (11/27 0820) Pulse Rate:  [63-93] 70 (11/27 0820) Cardiac Rhythm: Normal sinus rhythm (11/27 0707) Resp:  [13-20] 18 (11/27 0820) BP: (115-140)/(76-93) 115/93 (11/27 0820) SpO2:  [92 %-93 %] 92 % (11/27 0820) Weight:  [88 kg-88.2 kg] 88.2 kg (11/27 0412)  Intake/Output from previous day: 11/26 0701 - 11/27 0700 In: 3538.1 [P.O.:240; I.V.:3298.1] Out: 62 [Urine:1400; Chest Tube:2860] Intake/Output this shift: Total I/O In: -  Out: 325 [Urine:325]  General appearance: alert and agitated and frustrated Heart: regular rate and rhythm Lungs: clear to auscultation bilaterally Abdomen: soft, non-tender; bowel sounds normal; no masses,  no organomegaly Extremities: extremities normal, atraumatic, no cyanosis or edema Wound: clean and well healed  Lab Results: No results for input(s): WBC, HGB, HCT, PLT in the last 72 hours. BMET: Recent Labs    03/31/20 0026 04/01/20 0340  NA 135 135  K 4.5 4.2  CL 108 104  CO2 20* 24  GLUCOSE 186* 124*  BUN 23 25*  CREATININE 0.47* 0.45*  CALCIUM 8.5* 8.3*    PT/INR: No results for input(s): LABPROT, INR in the last 72 hours. ABG    Component Value Date/Time   PHART 7.454 (H) 02/25/2020 0500   HCO3 26.5  02/25/2020 0500   TCO2 27 02/24/2020 1944   ACIDBASEDEF 1.0 02/24/2020 1547   O2SAT 93.7 02/25/2020 0500   CBG (last 3)  Recent Labs    03/31/20 2349 04/01/20 0322 04/01/20 0816  GLUCAP 145* 140* 108*    Assessment/Plan: S/P Procedure(s) (LRB): Malignant neoplasm of esophagus (N/A)  1. S/P Esophagectomy, no leak per recent esophogram.  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 Patient remains understandably frustrated, he is very unhappy with care he has received.  He feels like nothing has been done for him.  He is very upset about possible IR embolization which has been scheduled for Friday 12/3 as he was told by Dr. Kipp Brood this would likely be done Monday, Tuesday...... He states he has asked to be transferred multiple times and this hasn't been done either.  I told patient that I am unable to transfer him as this has to be done by a physician.   3. Dispo- patient stable, continuing current care, understandably frustrated with situation care, awaiting IR to attempt embolization which is scheduled for Friday 12/3 unsure if this can be done sooner, also mentioned Dr. Kipp Brood possible doing this himself in [place].... this can be addressed Monday when Dr. Kipp Brood returns   LOS: 37 days    Ellwood Handler, PA-C 04/01/2020

## 2020-04-01 NOTE — Progress Notes (Signed)
Called by Jonelle Sidle RN stating patient's daughter was present in the room and is very angry/upset.  She was wanting to speak with someone immediately about her father and requesting the patient be transferred.   I spoke with Brett Dawson and his daughter.  They both again express frustration over multiple concerns/issues.    1. His daughter is upset that her father is not being cared for properly.  She states that is isnt being clean.  His hair hasnt been washed or brushed.  She states that when she requested help she was just given supplies.  She states the room was also filthy with stuff all over the floor on her arrival  2. They are both upset in regards to patients current medical condition.  I spoke with Brett Dawson this morning about his frustrations.  Again he and his daughter re-iterate frustration of him just laying in the his bed.  The daughter states they were told yesterday, he would have his procedure to fix his drainage issue on Monday or Tuesday.  This however is scheduled for Friday and they state this is unacceptable.  They state they were told it would be sooner.  They also state they were told if this didn't work the patient would be taken to the operating room Thursday at the latest.  I personally contact IR and spoke to the radiologist.  Unfortunately due to the complexity and time involved with emobilization of the thoracic duct Friday 12/3 would be the soonest available time slot.  It is unlikely this would be done sooner unless multiple other appointments got canceled.  The radiologist stated he understands the frustration of the patient but this is when they can perform the procedure.  The patient also asked why this wasn't fixed weeks ago as we knew this was a problem.  I explained to the patient that his chlyothorax is being managed in the standard fashion with TPN and limited fat intake.  He thinks this should have been fixed weeks ago.   3. They also state they have asked for a second  opinion multiple times.  They state this has not been taken care of either and if they can not get his issue addressed quickly here why can he not be transferred to another facility. His daughter states they have family who works here and told them to get him elsewhere.  I explained that as a PA I am unable to transfer the patient to another facility.  I said this would need to be done by Dr. Kipp Brood.  They stated this has been asked multiple times and has not yet been done   Plan:  I apologized to the patient and his daughter.  I understand their concerns and frustrations.  I personally am unable to adjust the IR schedule or take the patient to the operating room to fix the problems.  I also do not have the ability to transfer the patient to another institution.  I have requested the unit director on call speak with the patient and his daughter to address their concerns and issues with nursing care.  I have also spoke with Dr. Kipp Brood who will come in tomorrow to speak with the patient and his daughter again.  He also states he would speak with IR to see if the patient can be done any sooner.   Brett Handler, PA-C

## 2020-04-01 NOTE — Progress Notes (Signed)
PHARMACY - TOTAL PARENTERAL NUTRITION CONSULT NOTE  Indication: Chylothorax  Patient Measurements: Height: 5\' 11"  (180.3 cm) Weight: 88.2 kg (194 lb 7.1 oz) IBW/kg (Calculated) : 75.3 TPN AdjBW (KG): 81.4 Body mass index is 27.12 kg/m. Usual Weight: 118-122 kg prior to cancer dx, now 85.8 kg  Assessment:  26 YOM with esophageal cancer presented on 02/24/20 for esophagectomy and J-tube placement.  Patient was started on TF post-op and was briefly trialed on a clear liquid diet.  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 Patient underwent lymphangiogram and thoracic duct fenestration on 03/23/20 - unable to cannulate the thoracic duct. Pharmacy consulted to manage TPN to allow for healing as low-fat, MCT TF formula has not been successful.  Glucose / Insulin: no hx DM - CBGs 140-172 and improving s/p Decadron for 3 days (11/23-11/26). Required 35 units regular insulin in TPN (none in bag prior to steroids) + 25 units SSI in last 24hrs.  Electrolytes: all WNL Renal: Scr 0.45 stable, BUN up 25. Off Lasix IV on 11/17 LFTs / TGs: LFTs normalized. Tbili / TG WNL Prealbumin / albumin: prealbumin 15.4, albumin 1.8, LBM 11/23 Intake / Output; MIVF: UOP 0.7 ml/kg/hr, chest tube output 2831ml, net -16.4L this admit; LBM 11/23 GI Imaging: none since TPN  Surgeries / Procedures: none since TPN  Central access: PICC placed 03/24/20 TPN start date: 03/24/20  Nutritional Goals (per RD updated recommendations 11/23): 2800-3000 kCal, 190-210g protein, >/=2.2L fluid per day  Current Nutrition:  Clear liquid diet (charted as "good" with no % intake of meals reported) TPN  Plan:  Continue TPN at goal rate of 130 ml/hr (recalculated 11/23 per updated RD recommendations).  TPN will provide 190g AA, 374g CHO and 87g SMOF lipids, for a total 2908 kCal, meeting 100% of patient needs. Electrolytes in TPN: cont Cl:Ac to 2:1;  otherwise, continue same today - K reduced to 12 mEq/L on 11/24 and therefore had to reduce Phos to 8 mmol/L due national shortage of NaPhos; Na 125 mEq/L, Mag 12 mEq/L, Ca 1.5 mEq/L Add standard MVI and trace elements to TPN Continue resistant SSI Q4H, remove regular insulin added to TPN, and adjust as needed Monitor TPN labs, chest tube output, I/O's IR planning repeat attempt early next week at thoracic duct embolization if chylous effusion persists  Thank you for involving pharmacy in this patient's care.  Renold Genta, PharmD, BCPS Clinical Pharmacist Clinical phone for 04/01/2020 until 3p is [phone] 04/01/2020 7:07 AM  **Pharmacist phone directory can be found on [email or web address] listed under [place]**

## 2020-04-02 LAB — GLUCOSE, CAPILLARY
Glucose-Capillary: 137 mg/dL — ABNORMAL HIGH (ref 70–99)
Glucose-Capillary: 124 mg/dL — ABNORMAL HIGH (ref 70–99)
Glucose-Capillary: 120 mg/dL — ABNORMAL HIGH (ref 70–99)
Glucose-Capillary: 146 mg/dL — ABNORMAL HIGH (ref 70–99)
Glucose-Capillary: 118 mg/dL — ABNORMAL HIGH (ref 70–99)

## 2020-04-02 MED ORDER — TRAVASOL 10 % IV SOLN
INTRAVENOUS | Status: AC
  Filled 2020-04-02: qty 1903.2

## 2020-04-02 MED ORDER — TRAZODONE HCL 50 MG PO TABS
25.0000 mg | ORAL_TABLET | Freq: Once | ORAL | Status: AC
  Administered 2020-04-02: 25 mg via ORAL
  Filled 2020-04-02: qty 1

## 2020-04-02 NOTE — Plan of Care (Signed)

## 2020-04-02 NOTE — Progress Notes (Signed)
MD paged about pt having issues sleeping. Verbal order for 25mg  of trazodone received, with instructions to repeat dose x1 if it is ineffective.

## 2020-04-02 NOTE — Progress Notes (Signed)
     [address]       [place] [address]             [phone]       I had a long discussion with Mr. Hietala and his daughter.  They are understanding of the timing in addressing his chyle leak.  He will undergo another lymphogram this coming Friday.  If not successful, he with then be scheduled for a thoracic duct ligation shortly thereafter.    Her contact has been added to his chart, and she will help in planning for discharge.   Euclide Granito [name]

## 2020-04-02 NOTE — Progress Notes (Signed)
PHARMACY - TOTAL PARENTERAL NUTRITION CONSULT NOTE  Indication: Chylothorax  Patient Measurements: Height: 5\' 11"  (180.3 cm) Weight: 88.4 kg (194 lb 14.2 oz) IBW/kg (Calculated) : 75.3 TPN AdjBW (KG): 81.4 Body mass index is 27.18 kg/m. Usual Weight: 118-122 kg prior to cancer dx, now 85.8 kg  Assessment:  15 YOM with esophageal cancer presented on 02/24/20 for esophagectomy and J-tube placement.  Patient was started on TF post-op and was briefly trialed on a clear liquid diet.  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 Patient underwent lymphangiogram and thoracic duct fenestration on 03/23/20 - unable to cannulate the thoracic duct. Pharmacy consulted to manage TPN to allow for healing as low-fat, MCT TF formula has not been successful.  Glucose / Insulin: no hx DM - CBGs <180 and improved s/p Decadron for 3 days (11/23-11/26). Utilized 6 units SSI in last 24hrs, regular insulin removed from TPN 11/27 (none in bag prior to steroids).  Electrolytes: all WNL Renal: Scr 0.45 stable, BUN up 25. Off Lasix IV on 11/17 LFTs / TGs: LFTs normalized. Tbili / TG WNL Prealbumin / albumin: prealbumin 15.4, albumin 1.8, LBM 11/24 Intake / Output; MIVF: UOP 1 ml/kg/hr, chest tube output 1464ml, net -15.5L this admit; LBM 11/23 GI Imaging: none since TPN  Surgeries / Procedures: none since TPN  Central access: PICC placed 03/24/20 TPN start date: 03/24/20  Nutritional Goals (per RD updated recommendations 11/23): 2800-3000 kCal, 190-210g protein, >/=2.2L fluid per day  Current Nutrition:  Clear liquid diet (charted as "good" with 25% intake of meals reported) TPN  Plan:  Continue TPN at goal rate of 130 ml/hr (recalculated 11/23 per updated RD recommendations).  TPN will provide 190g AA, 374g CHO and 87g SMOF lipids, for a total 2908 kCal, meeting 100% of patient needs. Electrolytes in TPN: cont Cl:Ac to 2:1;  otherwise, continue same today - K reduced to 12 mEq/L on 11/24 and therefore had to reduce Phos to 8 mmol/L due national shortage of NaPhos; Na 125 mEq/L, Mag 12 mEq/L, Ca 1.5 mEq/L Add standard MVI and trace elements to TPN Continue resistant SSI Q4H and adjust as needed Monitor TPN labs, chest tube output, I/O's IR planning repeat attempt next week at thoracic duct embolization if chylous effusion persists  Thank you for involving pharmacy in this patient's care.  Renold Genta, PharmD, BCPS Clinical Pharmacist Clinical phone for 04/02/2020 until 3p is [phone] 04/02/2020 7:10 AM  **Pharmacist phone directory can be found on [email or web address] listed under [place]**

## 2020-04-02 NOTE — Progress Notes (Signed)
      [address]       [place] [address]             [phone]      10 Days Post-Op Procedure(s) (LRB): Malignant neoplasm of esophagus (N/A)   Subjective:  No new issues, spoke with Dr. Kipp Brood today.  Objective: Vital signs in last 24 hours: Temp:  [97.9 F (36.6 C)-99.7 F (37.6 C)] 98.6 F (37 C) (11/28 1122) Pulse Rate:  [76-94] 81 (11/28 1122) Cardiac Rhythm: Normal sinus rhythm (11/28 0752) Resp:  [17-24] 18 (11/28 1122) BP: (92-113)/(60-70) 92/60 (11/28 1122) SpO2:  [90 %-95 %] 95 % (11/28 1122) Weight:  [88.4 kg] 88.4 kg (11/28 0500)  Intake/Output from previous day: 11/27 0701 - 11/28 0700 In: 2822.1 [P.O.:720; I.V.:2102.1] Out: 3495 [Urine:2095; Chest Tube:1400] Intake/Output this shift: Total I/O In: 240 [P.O.:240] Out: -   General appearance: alert, cooperative and no distress Heart: regular rate and rhythm Lungs: clear to auscultation bilaterally Wound: clean and dry  Lab Results: No results for input(s): WBC, HGB, HCT, PLT in the last 72 hours. BMET: Recent Labs    03/31/20 0026 04/01/20 0340  NA 135 135  K 4.5 4.2  CL 108 104  CO2 20* 24  GLUCOSE 186* 124*  BUN 23 25*  CREATININE 0.47* 0.45*  CALCIUM 8.5* 8.3*    PT/INR: No results for input(s): LABPROT, INR in the last 72 hours. ABG    Component Value Date/Time   PHART 7.454 (H) 02/25/2020 0500   HCO3 26.5 02/25/2020 0500   TCO2 27 02/24/2020 1944   ACIDBASEDEF 1.0 02/24/2020 1547   O2SAT 93.7 02/25/2020 0500   CBG (last 3)  Recent Labs    04/01/20 2346 04/02/20 0750 04/02/20 1121  GLUCAP 149* 137* 124*    Assessment/Plan: S/P Procedure(s) (LRB): Malignant neoplasm of esophagus (N/A)  1.Chylothorax- 1400 cc output yesterday, leave tube in place, planning for repeat lymphogram on Friday by IR 2. GI- continue TPN, clears by mouth 3. Patient needs to ambulate and increase mobility 4. Dispo- patient stable, continue current care   LOS: 38 days     Ellwood Handler, PA-C 04/02/2020

## 2020-04-03 LAB — CBC
HCT: 27.7 % — ABNORMAL LOW (ref 39.0–52.0)
Hemoglobin: 8.9 g/dL — ABNORMAL LOW (ref 13.0–17.0)
MCH: 29.8 pg (ref 26.0–34.0)
MCHC: 32.1 g/dL (ref 30.0–36.0)
MCV: 92.6 fL (ref 80.0–100.0)
Platelets: 224 10*3/uL (ref 150–400)
RBC: 2.99 MIL/uL — ABNORMAL LOW (ref 4.22–5.81)
RDW: 17.5 % — ABNORMAL HIGH (ref 11.5–15.5)
WBC: 9.9 10*3/uL (ref 4.0–10.5)
nRBC: 0 % (ref 0.0–0.2)

## 2020-04-03 LAB — MAGNESIUM: Magnesium: 1.9 mg/dL (ref 1.7–2.4)

## 2020-04-03 LAB — COMPREHENSIVE METABOLIC PANEL
ALT: 19 U/L (ref 0–44)
AST: 15 U/L (ref 15–41)
Albumin: 1.7 g/dL — ABNORMAL LOW (ref 3.5–5.0)
Alkaline Phosphatase: 88 U/L (ref 38–126)
Anion gap: 8 (ref 5–15)
BUN: 17 mg/dL (ref 8–23)
CO2: 27 mmol/L (ref 22–32)
Calcium: 7.7 mg/dL — ABNORMAL LOW (ref 8.9–10.3)
Chloride: 99 mmol/L (ref 98–111)
Creatinine, Ser: 0.46 mg/dL — ABNORMAL LOW (ref 0.61–1.24)
GFR, Estimated: >60 mL/min (ref >60)
Glucose, Bld: 131 mg/dL — ABNORMAL HIGH (ref 70–99)
Potassium: 3.6 mmol/L (ref 3.5–5.1)
Sodium: 134 mmol/L — ABNORMAL LOW (ref 135–145)
Total Bilirubin: 0.5 mg/dL (ref 0.3–1.2)
Total Protein: 5.1 g/dL — ABNORMAL LOW (ref 6.5–8.1)

## 2020-04-03 LAB — DIFFERENTIAL
Abs Immature Granulocytes: 0.14 10*3/uL — ABNORMAL HIGH (ref 0.00–0.07)
Basophils Absolute: 0 10*3/uL (ref 0.0–0.1)
Basophils Relative: 0 %
Eosinophils Absolute: 0.1 10*3/uL (ref 0.0–0.5)
Eosinophils Relative: 1 %
Immature Granulocytes: 1 %
Lymphocytes Relative: 3 %
Lymphs Abs: 0.3 10*3/uL — ABNORMAL LOW (ref 0.7–4.0)
Monocytes Absolute: 0.9 10*3/uL (ref 0.1–1.0)
Monocytes Relative: 9 %
Neutro Abs: 8.5 10*3/uL — ABNORMAL HIGH (ref 1.7–7.7)
Neutrophils Relative %: 86 %

## 2020-04-03 LAB — GLUCOSE, CAPILLARY
Glucose-Capillary: 120 mg/dL — ABNORMAL HIGH (ref 70–99)
Glucose-Capillary: 129 mg/dL — ABNORMAL HIGH (ref 70–99)
Glucose-Capillary: 131 mg/dL — ABNORMAL HIGH (ref 70–99)
Glucose-Capillary: 132 mg/dL — ABNORMAL HIGH (ref 70–99)
Glucose-Capillary: 136 mg/dL — ABNORMAL HIGH (ref 70–99)
Glucose-Capillary: 140 mg/dL — ABNORMAL HIGH (ref 70–99)

## 2020-04-03 LAB — PHOSPHORUS: Phosphorus: 3.4 mg/dL (ref 2.5–4.6)

## 2020-04-03 LAB — PREALBUMIN: Prealbumin: 16.7 mg/dL — ABNORMAL LOW (ref 18–38)

## 2020-04-03 LAB — TRIGLYCERIDES: Triglycerides: 55 mg/dL (ref <150)

## 2020-04-03 MED ORDER — INSULIN ASPART 100 UNIT/ML ~~LOC~~ SOLN
0.0000 [IU] | SUBCUTANEOUS | Status: DC
  Administered 2020-04-03 – 2020-04-07 (×17): 2 [IU] via SUBCUTANEOUS
  Administered 2020-04-07 – 2020-04-08 (×3): 3 [IU] via SUBCUTANEOUS

## 2020-04-03 MED ORDER — TRAVASOL 10 % IV SOLN
INTRAVENOUS | Status: AC
  Filled 2020-04-03: qty 1903.2

## 2020-04-03 NOTE — Progress Notes (Addendum)
      [address]       [place] [address]             [phone]      Subjective:  Brett Dawson is awake and alert, no new issues.  Says he understands the plan of care.   He does is still reluctant to participate in PT /OT sessions.   The left pleural tube drained 1654ml past 24 hours.   TPN infusing per PICC.    Objective: Vital signs in last 24 hours: Temp:  [98.6 F (37 C)-99 F (37.2 C)] 99 F (37.2 C) (11/29 0622) Pulse Rate:  [77-87] 85 (11/29 0622) Cardiac Rhythm: Normal sinus rhythm (11/29 0705) Resp:  [16-22] 16 (11/29 0622) BP: (92-113)/(59-62) 104/61 (11/29 0622) SpO2:  [92 %-95 %] 94 % (11/29 0622) Weight:  [88.6 kg] 88.6 kg (11/29 0500)     Intake/Output from previous day: 11/28 0701 - 11/29 0700 In: 4227.1 [P.O.:240; I.V.:3987.1] Out: 9147 [Urine:1600; Chest Tube:1670] Intake/Output this shift:  General appearance: alert, cooperative and no distress Heart: regular rate and rhythm Lungs: The breath sounds are clear. The left pleural drain had about 1665ml  milky fluid past 24 hours.  Abdomen: soft, NT. Extremities: no peripheral edema Wound: port incisions have healed.   Lab Results: Recent Labs    04/03/20 0530  WBC 9.9  HGB 8.9*  HCT 27.7*  PLT 224   BMET:  Recent Labs    04/01/20 0340 04/03/20 0530  NA 135 134*  K 4.2 3.6  CL 104 99  CO2 24 27  GLUCOSE 124* 131*  BUN 25* 17  CREATININE 0.45* 0.46*  CALCIUM 8.3* 7.7*    PT/INR:  No results for input(s): LABPROT, INR in the last 72 hours. ABG    Component Value Date/Time   PHART 7.454 (H) 02/25/2020 0500   HCO3 26.5 02/25/2020 0500   TCO2 27 02/24/2020 1944   ACIDBASEDEF 1.0 02/24/2020 1547   O2SAT 93.7 02/25/2020 0500   CBG (last 3)  Recent Labs    04/02/20 2018 04/02/20 2305 04/03/20 0526  GLUCAP 146* 118* 129*    Assessment/Plan: S/P Procedure(s) (LRB): Malignant neoplasm of esophagus (N/A)    -POD-39 robotic assisted Ivor-Lewis  esophagectomy for distal esophageal adenocarcinoma. Follow up esophograms have shown no anastomotic leak. Discussed the importance of ambulation PT/OT to avoid other complications associated with a prolonged hospitalization. He agreed to make an effort to participate.   -Chylous left pleural effusion- Continues to have high volume left CT output (1698ml past 24 hours). Transitioned to TPN on 11/21. Had lymphangiogram by IR on 11/18 but cannulation and embolization of the thoracic was not successful.  Case discussed with IR by Dr Kipp Brood. Plan to continue nutritional support with TPN this week then re-attempt the lymphangiogram / embolization of thoracic duct on Friday.   -ID-Leukocytosis resolved, no further fevers.  -Renal- stable function.   -DVT PPX- on SQ enoxaparin daily.    Brett Odea, PA-C [phone]  Agree with above Lymphangiogram scheduled for this Friday  Lajuana Matte

## 2020-04-03 NOTE — Plan of Care (Signed)
  Problem: Education: Goal: Knowledge of General Education information will improve Description: Including pain rating scale, medication(s)/side effects and non-pharmacologic comfort measures Outcome: Progressing   Problem: Health Behavior/Discharge Planning: Goal: Ability to manage health-related needs will improve Outcome: Progressing   Problem: Clinical Measurements: Goal: Ability to maintain clinical measurements within normal limits will improve Outcome: Progressing Goal: Cardiovascular complication will be avoided Outcome: Progressing   Problem: Coping: Goal: Level of anxiety will decrease Outcome: Progressing   Problem: Elimination: Goal: Will not experience complications related to bowel motility Outcome: Progressing Goal: Will not experience complications related to urinary retention Outcome: Progressing   

## 2020-04-03 NOTE — Progress Notes (Signed)
PT Cancellation Note  Patient Details Name: Brett Dawson MRN: [id number] DOB: 14-Sep-1955   Cancelled Treatment:    Reason Eval/Treat Not Completed: (P) Patient declined, no reason specified (Pt refusing OOB, ambulation and supine exercises.  Pt reports," I am laying here until I get surgery.")   Sumayah Bearse Eli Hose 04/03/2020, 3:16 PM

## 2020-04-03 NOTE — Progress Notes (Signed)
OT Cancellation Note  Patient Details Name: Brett Dawson MRN: [id number] DOB: [birth year]   Cancelled Treatment:    Reason Eval/Treat Not Completed: Patient declined, no reason specified. Offered to assist pt OOB to chair, ADL at sink and ambulation, but pt refused all.   Malka So 04/03/2020, 9:37 AM  Nestor Lewandowsky, OTR/L Acute Rehabilitation Services Pager: [phone] Office: [phone]

## 2020-04-03 NOTE — Progress Notes (Signed)
PHARMACY - TOTAL PARENTERAL NUTRITION CONSULT NOTE  Indication: Chylothorax  Patient Measurements: Height: 5\' 11"  (180.3 cm) Weight: 88.6 kg (195 lb 5.2 oz) IBW/kg (Calculated) : 75.3 TPN AdjBW (KG): 81.4 Body mass index is 27.24 kg/m. Usual Weight: 118-122 kg prior to cancer dx, now 85.8 kg  Assessment:  23 YOM with esophageal cancer presented on 02/24/20 for esophagectomy and J-tube placement.  Patient was started on TF post-op and was briefly trialed on a clear liquid diet.  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 Patient underwent lymphangiogram and thoracic duct fenestration on 03/23/20 - unable to cannulate the thoracic duct. Pharmacy consulted to manage TPN to allow for healing as low-fat, MCT TF formula has not been successful.  Glucose / Insulin: no hx DM - CBGs <180.  Utilized 6 units SSI in last 24hrs 35 units of regular insulin removed from TPN 11/27 (none in bag prior to steroids 11/23-11/26).  Electrolytes: low Na, others WNL (K low normal) Renal: SCr < 1,  BUN WNL. Off Lasix IV on 11/17 LFTs / TGs: LFTs normalized. Tbili / TG WNL Prealbumin / albumin: prealbumin 15.4 > 16.7, albumin 1.7 Intake / Output; MIVF: UOP 0.8 ml/kg/hr, CT O/P 1636mL, net -12.9 (down), LBM 11/23 GI Imaging: none since TPN  Surgeries / Procedures: none since TPN  Central access: PICC placed 03/24/20 TPN start date: 03/24/20  Nutritional Goals (per RD updated rec 11/23): 2800-3000 kCal, 190-210g protein, >/= 2.2L fluid per day  Current Nutrition:  Clear liquid diet: 25-50% of meals TPN  Plan:  Continue TPN at goal rate of 130 ml/hr, providing 190g AA, 374g CHO and 87g SMOFlipids, for a total 2908 kCal, meeting 100% of patient needs. Electrolytes in TPN: incr Na 137mEq/L, incr K slightly to 93mEq/L, Mag 12 mEq/L, Ca 1.5 mEq/L, Cl:Ac 2:1 Add standard MVI and trace elements to TPN Reduce SSI to moderate  Q4H Standard TPN labs on Mon/Thurs, chest tube output, I/O's  Lymphogram 12/3 and if unsuccessful, thoracic duct embolization   Eular Panek D. Mina Marble, PharmD, BCPS, [place] 04/03/2020, 7:32 AM

## 2020-04-04 LAB — GLUCOSE, CAPILLARY
Glucose-Capillary: 130 mg/dL — ABNORMAL HIGH (ref 70–99)
Glucose-Capillary: 127 mg/dL — ABNORMAL HIGH (ref 70–99)
Glucose-Capillary: 121 mg/dL — ABNORMAL HIGH (ref 70–99)
Glucose-Capillary: 121 mg/dL — ABNORMAL HIGH (ref 70–99)
Glucose-Capillary: 99 mg/dL (ref 70–99)
Glucose-Capillary: 136 mg/dL — ABNORMAL HIGH (ref 70–99)

## 2020-04-04 MED ORDER — TRAVASOL 10 % IV SOLN
INTRAVENOUS | Status: AC
  Filled 2020-04-04: qty 1903.2

## 2020-04-04 NOTE — Progress Notes (Signed)
Supervising Physician: Dr. Kathlene Cote  Patient Status:  [facility] - In-pt  Chief Complaint: (L) Chylothorax  Subjective: No new c/o today.  Allergies: Patient has no known allergies.  Medications:  Current Facility-Administered Medications:  .  acetaminophen (TYLENOL) 160 MG/5ML solution 650 mg, 650 mg, Per Tube, Q4H PRN, Lajuana Matte, MD, 650 mg at 03/19/20 1139 .  amoxicillin-clavulanate (AUGMENTIN) 400-57 MG/5ML suspension 875 mg, 875 mg, Per Tube, Q12H, Lightfoot, Lucile Crater, MD, 875 mg at 04/04/20 0818 .  Chlorhexidine Gluconate Cloth 2 % PADS 6 each, 6 each, Topical, Daily, Lightfoot, Lucile Crater, MD, 6 each at 04/04/20 0818 .  enoxaparin (LOVENOX) injection 40 mg, 40 mg, Subcutaneous, Q24H, Einar Grad, RPH, 40 mg at 04/03/20 1815 .  fluticasone (FLONASE) 50 MCG/ACT nasal spray 2 spray, 2 spray, Each Nare, Daily, Conte, Tessa N, PA-C, 2 spray at 04/02/20 1014 .  guaiFENesin (ROBITUSSIN) 100 MG/5ML solution 300 mg, 15 mL, Per Tube, Q4H PRN, Roddenberry, Myron G, PA-C .  insulin aspart (novoLOG) injection 0-15 Units, 0-15 Units, Subcutaneous, Q4H, Tyrone Apple, RPH, 2 Units at 04/04/20 0818 .  lactated ringers infusion, , Intravenous, Continuous, Barnet Glasgow, MD, Stopped at 03/23/20 1146 .  levalbuterol (XOPENEX) nebulizer solution 0.63 mg, 0.63 mg, Nebulization, Q6H PRN, Lajuana Matte, MD, 0.63 mg at 03/12/20 2337 .  lipiodol ultrafluid injection, , , PRN, Sandi Mariscal, MD, 20 mL at 03/23/20 1139 .  LORazepam (ATIVAN) injection 0.5 mg, 0.5 mg, Intravenous, Q6H PRN, Barrett, Erin R, PA-C, 0.5 mg at 04/02/20 2316 .  melatonin tablet 3 mg, 3 mg, Per Tube, QHS, Prescott Gum, Collier Salina, MD, 3 mg at 04/03/20 2139 .  nystatin ointment (MYCOSTATIN), , Topical, BID, Barrett, Erin R, PA-C, Given at 04/04/20 0819 .  ondansetron (ZOFRAN) injection 4 mg, 4 mg, Intravenous, Q4H PRN, Gold, Wayne E, PA-C .  pantoprazole sodium (PROTONIX) 40 mg/20 mL oral suspension 40 mg, 40  mg, Per Tube, BID, Kris Mouton, RPH, 40 mg at 04/04/20 0818 .  saline (AYR) nasal gel with aloe 1 application, 1 application, Each Nare, Q4H PRN, Barrett, Erin R, PA-C, 1 application at 73/71/06 1715 .  silver sulfADIAZINE (SILVADENE) 1 % cream, , Topical, BID, Lajuana Matte, MD, Given at 04/04/20 [phone]  sodium chloride (OCEAN) 0.65 % nasal spray 1 spray, 1 spray, Each Nare, PRN, Lightfoot, Lucile Crater, MD, 1 spray at 03/13/20 1300 .  sodium chloride flush (NS) 0.9 % injection 10-40 mL, 10-40 mL, Intracatheter, Q12H, Lightfoot, Harrell O, MD, 10 mL at 04/04/20 0819 .  sodium chloride flush (NS) 0.9 % injection 10-40 mL, 10-40 mL, Intracatheter, PRN, Lightfoot, Harrell O, MD .  TPN ADULT (ION), , Intravenous, Continuous TPN, Tyrone Apple, RPH, Last Rate: 130 mL/hr at 04/03/20 1900, Rate Verify at 04/03/20 1900 .  TPN ADULT (ION), , Intravenous, Continuous TPN, Dang, Thuy D, RPH    Vital Signs: BP 109/60 (BP Location: Left Arm)   Pulse 80   Temp 98.3 F (36.8 C) (Oral)   Resp 15   Ht 5\' 11"  (1.803 m)   Wt 88.6 kg   SpO2 96%   BMI 27.24 kg/m   Physical Exam  NAD, alert Chest: left chest tube in place.  1.4 L cloudy yellow fluid in PleurVac. No air leak.  Imaging: No results found.  Labs:  CBC: Recent Labs    03/24/20 0024 03/25/20 0430 03/27/20 0430 04/03/20 0530  WBC 10.2 12.7* 10.1 9.9  HGB  9.5* 9.9* 9.2* 8.9*  HCT 28.2* 30.2* 28.7* 27.7*  PLT 509* 543* 419* 224    COAGS: Recent Labs    09/18/19 0808 02/22/20 0830 03/08/20 0015 03/23/20 0446  INR 1.2 1.0 1.1 1.2  APTT  --  30  --   --     BMP: Recent Labs    12/27/19 0810 12/27/19 0810 01/03/20 0813 01/03/20 0813 01/11/20 0814 01/11/20 0814 01/26/20 1426 02/22/20 0830 03/30/20 0210 03/31/20 0026 04/01/20 0340 04/03/20 0530  NA 136   < > 140   < > 139   < > 140   < > 134* 135 135 134*  K 4.0   < > 3.9   < > 4.2   < > 4.2   < > 4.4 4.5 4.2 3.6  CL 107   < > 106   < > 105   < > 106   < >  104 108 104 99  CO2 23   < > 27   < > 29   < > 29   < > 22 20* 24 27  GLUCOSE 117*   < > 127*   < > 105*   < > 99   < > 241* 186* 124* 131*  BUN 14   < > 12   < > 11   < > 10   < > 21 23 25* 17  CALCIUM 9.4   < > 9.4   < > 9.0   < > 8.8*   < > 8.5* 8.5* 8.3* 7.7*  CREATININE 0.66   < > 0.63   < > 0.68   < > 0.71   < > 0.36* 0.47* 0.45* 0.46*  GFRNONAA >60   < > >60   < > >60   < > >60   < > >60 >60 >60 >60  GFRAA >60  --  >60  --  >60  --  >60  --   --   --   --   --    < > = values in this interval not displayed.    LIVER FUNCTION TESTS: Recent Labs    03/30/20 0210 03/31/20 0026 04/01/20 0340 04/03/20 0530  BILITOT 0.2* 0.2* 0.6 0.5  AST 18 18 18 15   ALT 32 29 28 19   ALKPHOS 122 125 113 88  PROT 6.3* 5.8* 5.5* 5.1*  ALBUMIN 1.8* 1.8* 1.8* 1.7*    Assessment and Plan: Chylothorax Ongoing increased output from chest tube.  IR now schedule lymphangiogram and thoracic duct embo procedure for Friday.   Reviewed procedure and obtained consent today. Will finalize plans/orders in the coming days. Continue TPN and chest tube management per primary.   Electronically Signed: Ascencion Dike, PA-C 04/04/2020, 11:30 AM   I spent a total of 15 Minutes at the the patient's bedside AND on the patient's hospital floor or unit, greater than 50% of which was counseling/coordinating care for chylothorax.

## 2020-04-04 NOTE — Plan of Care (Signed)

## 2020-04-04 NOTE — Progress Notes (Addendum)
      [address]       [place] [address]             [phone]      Subjective:  Mr. Diodato is awake and alert, no new issues.  Says he understands the plan of care.   He continues decline to participate in PT /OT sessions.    TPN infusing per PICC.    Objective: Vital signs in last 24 hours: Temp:  [97.9 F (36.6 C)-99.7 F (37.6 C)] 97.9 F (36.6 C) (11/30 0759) Pulse Rate:  [79-100] 80 (11/30 0759) Cardiac Rhythm: Normal sinus rhythm (11/30 0700) Resp:  [15-21] 15 (11/30 0759) BP: (96-109)/(57-68) 109/60 (11/30 0759) SpO2:  [92 %-96 %] 96 % (11/30 0759)   Intake/Output from previous day: 11/29 0701 - 11/30 0700 In: 184.1 [I.V.:184.1] Out: 2880 [Urine:1520; Chest Tube:1360] Intake/Output this shift:  General appearance: alert, cooperative and no distress Heart: regular rate and rhythm Lungs: The breath sounds are clear. The left pleural drain had about 1390ml  milky fluid past 24 hours.  Abdomen: soft, NT. Extremities: no peripheral edema Wound: port incisions have healed.   Lab Results: Recent Labs    04/03/20 0530  WBC 9.9  HGB 8.9*  HCT 27.7*  PLT 224   BMET:  Recent Labs    04/03/20 0530  NA 134*  K 3.6  CL 99  CO2 27  GLUCOSE 131*  BUN 17  CREATININE 0.46*  CALCIUM 7.7*    PT/INR:  No results for input(s): LABPROT, INR in the last 72 hours. 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 Follow up esophograms have shown no anastomotic leak. Tolerating clear liquids PO.  He continues to refuse  participation id PT /OT.  -Chylous left pleural effusion- Continues to have high volume left CT output. Transitioned to TPN on 11/21. Had lymphangiogram by IR on 11/18 but cannulation and embolization of the thoracic was not successful.  Case discussed with IR by Dr Kipp Brood. Plan to continue nutritional support with TPN this week then re-attempt the lymphangiogram / embolization of thoracic duct on Friday, 12/3.   -ID-Leukocytosis resolved, no further fevers.  -Renal- stable function.   -DVT PPX- on SQ enoxaparin daily.    [name], PA-C [phone]  Agree with above. Diagram scheduled for Friday.  If unsuccessful we will plan for surgical thoracic duct ligation.  Mihail Prettyman [name]

## 2020-04-04 NOTE — Progress Notes (Signed)
Nutrition Follow-up  DOCUMENTATION CODES:   Severe malnutrition in context of acute illness/injury  INTERVENTION:   - Continue TPN per Pharmacy to meet 100% of pt's estimated needs  NUTRITION DIAGNOSIS:   Severe Malnutrition related to acute illness (chylothorax) as evidenced by severe fat depletion, severe muscle depletion, percent weight loss (14.7% weight loss in less than 1 month).  Ongoing  GOAL:   Patient will meet greater than or equal to 90% of their needs  Met via TPN  MONITOR:   PO intake, Supplement acceptance, Labs, Weight trends, Skin, I & O's  REASON FOR ASSESSMENT:   Consult Enteral/tube feeding initiation and management  ASSESSMENT:   64 year old male who presented on 10/21 for Ivor Lewis esophagectomy and J-tube placement. 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  10/22 - TF initiated 10/26 - swallow study, no evidence of leak, NGT removed 10/27 - MBS, diet advanced to clears, transitioned to nocturnal TF 10/31 - s/p thoracentesis with 1 L fluid removed 11/01 - chest tube placed, NPO 11/03 - left chest tube placed by IR 11/04 - clear liquids 11/05 - JP drain removed 11/18 - lymphangiogram and unsuccessful thoracic duct embolization 11/19 - PICC placed, TPN initiated10/22 - TF initiated 10/26 - swallow study, no evidence of leak, NGT removed 10/27 - MBS, diet advanced to clears, transitioned to nocturnal TF 10/31 - s/p thoracentesis with 1 L fluid removed 11/01 - chest tube placed, NPO 11/03 - left chest tube placed by IR 11/04 - clear liquids 11/05 - JP drain removed 11/18 - lymphangiogram and unsuccessful thoracic duct embolization 11/19 - PICC placed, TPN initiated  Pt continues to receive TPN at goal rate of 130 ml/hr to provide 2908 kcal and 190 grams of protein daily. Per notes, plan is for lymphangiogram/embolization of  thoracic duct on 12/03.  Spoke with pt at bedside. Pt reports no new complaints. Encouraged pt to ambulate and work with PT/OT. Pt states that he just wants to sit in bed and wait for his surgery. Explained importance of moving muscles and preventing additional muscle loss. Pt states, "I've heard all this before." RD continued to encouraged pt to engage with therapies and ambulate.  Pt reports that he is concerned his sugars are too high with drinking juice. Explained how TPN elevated blood sugar as well as steroids that pt has been on (now off).  Admit weight: 99.6 kg Current weight: 88.6 kg  Meal Completion: 25-50% x last 8 charted meals (clear liquids)  Medications reviewed and include: SSI q 4 hours, protonix, TPN  Labs reviewed: sodium 134, hemoglobin 8.9 CBG's: 127-140 x 24 hours  UOP: 1520 ml x 24 hours CT output: 1360 ml x 24 hours I/O's: -2.3 L since admit  Diet Order:   Diet Order            Diet clear liquid Room service appropriate? Yes; Fluid consistency: Thin  Diet effective now                 EDUCATION NEEDS:   Education needs have been addressed  Skin:  Skin Assessment: Skin Integrity Issues: Incisions: chest, abdomen, groin  Last BM:  03/30/20  Height:   Ht Readings from Last 1 Encounters:  02/24/20 '5\' 11"'  (1.803 m)    Weight:   Wt Readings from Last 1 Encounters:  04/03/20 88.6 kg    Ideal Body Weight:  78.2 kg  BMI:  Body mass index is 27.24 kg/m.  Estimated Nutritional Needs:   Kcal:  2800-3000  Protein:  190-210 grams  Fluid:  >/= 2.2 L    Gustavus Bryant, MS, RD, LDN Inpatient Clinical Dietitian Please see AM[facility] for contact information.

## 2020-04-04 NOTE — Progress Notes (Signed)
PT Cancellation Note  Patient Details Name: Brett Dawson MRN: [id number] DOB: Sep 09, 1955   Cancelled Treatment:    Reason Eval/Treat Not Completed: (P) Patient declined, no reason specified Pt refuses to work with therapy until after his surgery on Friday. PT is signing off for now. Please reorder after surgery as appropriate.  Sargon Scouten B. Migdalia Dk PT, DPT Acute Rehabilitation Services Pager [phone] Office [phone]    [place] 04/04/2020, 2:01 PM

## 2020-04-04 NOTE — Progress Notes (Signed)
Physical Therapy Discharge Patient Details Name: Brett Dawson MRN: [id number] DOB: [birth year] Today's Date: 04/04/2020 Time:  -     Patient discharged from PT services secondary to patient has refused 3 (three) times without medical reason.  Please see latest therapy progress note for current level of functioning and progress toward goals.    Progress and discharge plan discussed with patient and/or caregiver: Pt refuses therapy until after surgery 12/3. Please reoorder PT services as appropriate at that time.  GP   Dani Gobble. Migdalia Dk PT, DPT Acute Rehabilitation Services Pager [phone] Office [phone]   [place] 04/04/2020, 2:02 PM

## 2020-04-04 NOTE — Progress Notes (Signed)
OT Cancellation Note  Patient Details Name: Brett Dawson MRN: [id number] DOB: Nov 15, 1955   Cancelled Treatment:    Reason Eval/Treat Not Completed: Patient declined, no reason specified Patient adamantly refusing to work with therapy until after his surgery on Friday. Attempted multiple times to educate patient on importance of therapy, but patient "does not want to get his hopes up any more". Therapy will follow up after surgery on 12/3.  Corinne Ports E. Larine Fielding, COTA/L Acute Rehabilitation Services [place] 04/04/2020, 2:50 PM

## 2020-04-04 NOTE — Progress Notes (Signed)
PHARMACY - TOTAL PARENTERAL NUTRITION CONSULT NOTE  Indication: Chylothorax  Patient Measurements: Height: 5\' 11"  (180.3 cm) Weight: 88.6 kg (195 lb 5.2 oz) IBW/kg (Calculated) : 75.3 TPN AdjBW (KG): 81.4 Body mass index is 27.24 kg/m. Usual Weight: 118-122 kg prior to cancer dx, now 85.8 kg  Assessment:  68 YOM with esophageal cancer presented on 02/24/20 for esophagectomy and J-tube placement.  Patient was started on TF post-op and was briefly trialed on a clear liquid diet.  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 Patient underwent lymphangiogram and thoracic duct fenestration on 03/23/20 - unable to cannulate the thoracic duct. Pharmacy consulted to manage TPN to allow for healing as low-fat, MCT TF formula has not been successful.  Glucose / Insulin: no hx DM - CBGs <180.  Utilized 9 units SSI in last 24hrs 35 units of regular insulin removed from TPN 11/27 (none in bag prior to steroids 11/23-11/26).  Electrolytes: 1129 labs - low Na, others WNL Renal: SCr < 1,  BUN WNL. Off Lasix IV on 11/17 LFTs / TGs: LFTs normalized. Tbili / TG WNL Prealbumin / albumin: prealbumin 15.4 > 16.7, albumin 1.7 Intake / Output; MIVF: UOP 0.7 ml/kg/hr, CT O/P 1390mL, net -13.2, LBM 11/23 GI Imaging: none since TPN  Surgeries / Procedures: none since TPN  Central access: PICC placed 03/24/20 TPN start date: 03/24/20  Nutritional Goals (per RD updated rec 11/23): 2800-3000 kCal, 190-210g protein, >/= 2.2L fluid per day  Current Nutrition:  Clear liquid diet: 25-50% of meals TPN  Plan:  Continue TPN at goal rate of 130 ml/hr, providing 190g AA, 374g CHO and 87g SMOFlipids, for a total 2908 kCal, meeting 100% of patient needs. Electrolytes in TPN: max Na 126mEq/L on 11/29, K incr to 80mEq/L on 11/29, Mag 12 mEq/L, Ca 1.5 mEq/L, Cl:Ac 2:1 - no change today Add standard MVI and trace elements to TPN Continue moderate  SSI Q4H Standard TPN labs on Mon/Thurs, chest tube output, I/O's  Lymphangiogram/embolization of thoracic duct 12/3  Kyndell Zeiser D. Mina Marble, PharmD, BCPS, [place] 04/04/2020, 7:12 AM

## 2020-04-05 LAB — CBC
HCT: 26.4 % — ABNORMAL LOW (ref 39.0–52.0)
Hemoglobin: 8.4 g/dL — ABNORMAL LOW (ref 13.0–17.0)
MCH: 29.8 pg (ref 26.0–34.0)
MCHC: 31.8 g/dL (ref 30.0–36.0)
MCV: 93.6 fL (ref 80.0–100.0)
Platelets: 197 10*3/uL (ref 150–400)
RBC: 2.82 MIL/uL — ABNORMAL LOW (ref 4.22–5.81)
RDW: 16.8 % — ABNORMAL HIGH (ref 11.5–15.5)
WBC: 6.1 10*3/uL (ref 4.0–10.5)
nRBC: 0 % (ref 0.0–0.2)

## 2020-04-05 LAB — GLUCOSE, CAPILLARY
Glucose-Capillary: 134 mg/dL — ABNORMAL HIGH (ref 70–99)
Glucose-Capillary: 129 mg/dL — ABNORMAL HIGH (ref 70–99)
Glucose-Capillary: 131 mg/dL — ABNORMAL HIGH (ref 70–99)
Glucose-Capillary: 110 mg/dL — ABNORMAL HIGH (ref 70–99)
Glucose-Capillary: 115 mg/dL — ABNORMAL HIGH (ref 70–99)
Glucose-Capillary: 131 mg/dL — ABNORMAL HIGH (ref 70–99)

## 2020-04-05 MED ORDER — TRAVASOL 10 % IV SOLN
INTRAVENOUS | Status: AC
  Filled 2020-04-05 (×4): qty 1903.2

## 2020-04-05 NOTE — Progress Notes (Signed)
PHARMACY - TOTAL PARENTERAL NUTRITION CONSULT NOTE  Indication: Chylothorax  Patient Measurements: Height: 5\' 11"  (180.3 cm) Weight: 88.6 kg (195 lb 5.2 oz) IBW/kg (Calculated) : 75.3 TPN AdjBW (KG): 81.4 Body mass index is 27.24 kg/m. Usual Weight: 118-122 kg prior to cancer dx, now 85.8 kg  Assessment:  67 YOM with esophageal cancer presented on 02/24/20 for esophagectomy and J-tube placement.  Patient was started on TF post-op and was briefly trialed on a clear liquid diet.  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 Patient underwent lymphangiogram and thoracic duct fenestration on 03/23/20 - unable to cannulate the thoracic duct. Pharmacy consulted to manage TPN to allow for healing as low-fat, MCT TF formula has not been successful.  Glucose / Insulin: no hx DM - CBGs <180.  Utilized 10 units SSI in last 24hrs 35 units of regular insulin removed from TPN 11/27 (none in bag prior to steroids 11/23-11/26).  Electrolytes: 11/29 labs - low Na, others WNL Renal: SCr < 1,  BUN WNL. Off Lasix IV on 11/17 LFTs / TGs: LFTs normalized. Tbili / TG WNL Prealbumin / albumin: prealbumin 15.4 > 16.7, albumin 1.7 Intake / Output; MIVF: UOP 0.7 ml/kg/hr, CT O/P 294mL, net -13.1, LBM 11/25 GI Imaging: none since TPN  Surgeries / Procedures: none since TPN  Central access: PICC placed 03/24/20 TPN start date: 03/24/20  Nutritional Goals (per RD updated rec 11/30): 2800-3000 kCal, 190-210g protein, >/= 2.2L fluid per day  Current Nutrition:  Clear liquid diet: 25-50% of meals TPN  Plan:  Continue TPN at goal rate of 130 ml/hr, providing 190g AA, 374g CHO and 87g SMOFlipids, for a total 2908 kCal, meeting 100% of patient needs. Electrolytes in TPN: max Na 174mEq/L on 11/29, K incr to 39mEq/L on 11/29, Mag 12 mEq/L, Ca 1.5 mEq/L, Cl:Ac 2:1 - no change today Add standard MVI and trace elements to TPN Continue  moderate SSI Q4H Standard TPN labs on Mon/Thurs, chest tube output, I/O's  Lymphangiogram/embolization of thoracic duct 12/3  Thank you for involving pharmacy in this patient's care.  Renold Genta, PharmD, BCPS Clinical Pharmacist Clinical phone for 04/05/2020 until 3p is [phone] 04/05/2020 7:31 AM  **Pharmacist phone directory can be found on [email or web address] listed under [place]**

## 2020-04-05 NOTE — Progress Notes (Addendum)
      [address]       [place] [address]             [phone]      Subjective:  Brett Dawson is awake and alert, says he has no new problems or concerns.  He was seen by IR staff yesterday and has consented for lymphangiogram and thoracic duct embolization for Friday 12/3.  He continues decline to participate in PT /OT sessions and has been discharged from PT services for this reason.    TPN infusing per PICC.    Objective: Vital signs in last 24 hours: Temp:  [98.3 F (36.8 C)-99 F (37.2 C)] 98.9 F (37.2 C) (12/01 0315) Pulse Rate:  [75-92] 78 (12/01 0315) Cardiac Rhythm: Normal sinus rhythm (12/01 0708) Resp:  [14-18] 14 (12/01 0315) BP: (99-107)/(54-63) 107/59 (12/01 0315) SpO2:  [90 %-96 %] 95 % (12/01 0315)   Intake/Output from previous day: 11/30 0701 - 12/01 0700 In: 2891.3 [I.V.:2891.3] Out: 4521 [Urine:1571; Chest Tube:2950] Intake/Output this shift:  General appearance: alert, cooperative and no distress Heart: regular rate and rhythm Lungs: The breath sounds are clear. The left pleural drain had >2Lml  milky fluid past 24 hours.  Abdomen: soft, NT.  The inflammation at the J-tube site has mostly subsided and is much less tender.  Extremities: no peripheral edema Wound: port incisions have healed.   Lab Results: Recent Labs    04/03/20 0530 04/05/20 0500  WBC 9.9 6.1  HGB 8.9* 8.4*  HCT 27.7* 26.4*  PLT 224 197   BMET:  Recent Labs    04/03/20 0530  NA 134*  K 3.6  CL 99  CO2 27  GLUCOSE 131*  BUN 17  CREATININE 0.46*  CALCIUM 7.7*    PT/INR:  No results for input(s): LABPROT, INR in the last 72 hours. 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 Follow up esophograms have shown no anastomotic leak. Tolerating clear liquids PO.  He continues to refuse participation in PT /OT saying he "doesn't want to get his hopes up".  -Chylous left pleural effusion- Continues to have high volume left CT output. Transitioned to TPN on 11/21. Had lymphangiogram by IR on 11/18 but cannulation and embolization of the thoracic was not successful.  Case discussed with IR by Dr Kipp Brood. Plan to continue nutritional support with TPN this week then re-attempt the lymphangiogram / embolization of thoracic duct on Friday, 12/3. If this is not successful, Dr. Kipp Brood plans surgical ligation of the thoracic duct.   -ID-Leukocytosis resolved, no further fevers. J-tube site has some mild inflammation but does not appear infected. Day 21 Augmentin, dill d/c after today.   -Renal- stable function.   -Anemia- Expected with prolonged illness, no obvious losses and currently no indication for transfusion. Will monitor  -DVT PPX- on SQ enoxaparin daily.    Brett Odea, PA-C [phone]   Doing well Continues to have very high CT output IR on Friday  Brett Dawson Brett Dawson Brett Dawson

## 2020-04-06 LAB — MAGNESIUM: Magnesium: 1.7 mg/dL (ref 1.7–2.4)

## 2020-04-06 LAB — GLUCOSE, CAPILLARY
Glucose-Capillary: 110 mg/dL — ABNORMAL HIGH (ref 70–99)
Glucose-Capillary: 115 mg/dL — ABNORMAL HIGH (ref 70–99)
Glucose-Capillary: 117 mg/dL — ABNORMAL HIGH (ref 70–99)
Glucose-Capillary: 121 mg/dL — ABNORMAL HIGH (ref 70–99)
Glucose-Capillary: 124 mg/dL — ABNORMAL HIGH (ref 70–99)
Glucose-Capillary: 126 mg/dL — ABNORMAL HIGH (ref 70–99)

## 2020-04-06 LAB — COMPREHENSIVE METABOLIC PANEL
ALT: 14 U/L (ref 0–44)
AST: 14 U/L — ABNORMAL LOW (ref 15–41)
Albumin: 1.6 g/dL — ABNORMAL LOW (ref 3.5–5.0)
Alkaline Phosphatase: 76 U/L (ref 38–126)
Anion gap: 6 (ref 5–15)
BUN: 13 mg/dL (ref 8–23)
CO2: 28 mmol/L (ref 22–32)
Calcium: 7.5 mg/dL — ABNORMAL LOW (ref 8.9–10.3)
Chloride: 101 mmol/L (ref 98–111)
Creatinine, Ser: 0.43 mg/dL — ABNORMAL LOW (ref 0.61–1.24)
GFR, Estimated: >60 mL/min (ref >60)
Glucose, Bld: 123 mg/dL — ABNORMAL HIGH (ref 70–99)
Potassium: 3.7 mmol/L (ref 3.5–5.1)
Sodium: 135 mmol/L (ref 135–145)
Total Bilirubin: 0.4 mg/dL (ref 0.3–1.2)
Total Protein: 4.9 g/dL — ABNORMAL LOW (ref 6.5–8.1)

## 2020-04-06 LAB — PHOSPHORUS: Phosphorus: 2.8 mg/dL (ref 2.5–4.6)

## 2020-04-06 MED ORDER — TRAVASOL 10 % IV SOLN
INTRAVENOUS | Status: DC
  Filled 2020-04-06: qty 1903.2

## 2020-04-06 MED ORDER — MAGNESIUM SULFATE IN D5W 1-5 GM/100ML-% IV SOLN
1.0000 g | Freq: Once | INTRAVENOUS | Status: AC
  Administered 2020-04-06: 1 g via INTRAVENOUS
  Filled 2020-04-06: qty 100

## 2020-04-06 MED ORDER — TRACE MINERALS CU-MN-SE-ZN 300-55-60-3000 MCG/ML IV SOLN
INTRAVENOUS | Status: AC
  Filled 2020-04-06: qty 1327.2

## 2020-04-06 NOTE — Anesthesia Preprocedure Evaluation (Addendum)
Anesthesia Evaluation  Patient identified by MRN, date of birth, ID band Patient awake    Reviewed: Allergy & Precautions, NPO status , Patient's Chart, lab work & pertinent test results  Airway Mallampati: II  TM Distance: >3 FB Neck ROM: Full    Dental no notable dental hx. (+) Poor Dentition, Chipped, Missing, Loose,    Pulmonary neg pulmonary ROS,    Pulmonary exam normal breath sounds clear to auscultation       Cardiovascular Exercise Tolerance: Good negative cardio ROS Normal cardiovascular exam Rhythm:Regular Rate:Normal     Neuro/Psych negative neurological ROS  negative psych ROS   GI/Hepatic negative GI ROS, Neg liver ROS,   Endo/Other  negative endocrine ROS  Renal/GU negative Renal ROSK+ 4.8 Cr 0.78  negative genitourinary   Musculoskeletal negative musculoskeletal ROS (+)   Abdominal   Peds negative pediatric ROS (+)  Hematology negative hematology ROS (+) Blood dyscrasia, anemia , Hgb 10.6   Anesthesia Other Findings S/P resection of malignant neoplasm of Esophagus   Reproductive/Obstetrics negative OB ROS                            Anesthesia Physical  Anesthesia Plan  ASA: III  Anesthesia Plan: General   Post-op Pain Management:    Induction: Intravenous  PONV Risk Score and Plan: Treatment may vary due to age or medical condition, Ondansetron, Dexamethasone and Midazolam  Airway Management Planned: Oral ETT and LMA  Additional Equipment: None  Intra-op Plan:   Post-operative Plan: Extubation in OR  Informed Consent: I have reviewed the patients History and Physical, chart, labs and discussed the procedure including the risks, benefits and alternatives for the proposed anesthesia with the patient or authorized representative who has indicated his/her understanding and acceptance.     Dental advisory given  Plan Discussed with: CRNA and  Anesthesiologist  Anesthesia Plan Comments:        Anesthesia Quick Evaluation

## 2020-04-06 NOTE — Progress Notes (Addendum)
      [address]       [place] [address]             [phone]      Subjective:  Mr. Schubach is awake and alert. No new issues.    TPN infusing per PICC.    Objective: Vital signs in last 24 hours: Temp:  [98.1 F (36.7 C)-98.9 F (37.2 C)] 98.6 F (37 C) (12/02 0338) Pulse Rate:  [75-87] 87 (12/02 0338) Cardiac Rhythm: Normal sinus rhythm (12/02 0703) Resp:  [16-18] 17 (12/02 0338) BP: (98-115)/(57-70) 115/62 (12/02 0338) SpO2:  [92 %-96 %] 93 % (12/02 0338)   Intake/Output from previous day: 12/01 0701 - 12/02 0700 In: 3513.9 [P.O.:210; I.V.:3183.9] Out: 4900 [Urine:2050; Chest Tube:2850] Intake/Output this shift:  General appearance: alert, cooperative and mild distress. Remains frustrated about the length of his hospitalization.  Heart: regular rate and rhythm Lungs: breath sounds are clear. The left pleural drain had >2Lml  milky fluid past 24 hours.  Abdomen: soft, NT.   Extremities: no peripheral edema    Lab Results: Recent Labs    04/05/20 0500  WBC 6.1  HGB 8.4*  HCT 26.4*  PLT 197   BMET:  Recent Labs    04/06/20 0415  NA 135  K 3.7  CL 101  CO2 28  GLUCOSE 123*  BUN 13  CREATININE 0.43*  CALCIUM 7.5*    PT/INR:  No results for input(s): LABPROT, INR in the last 72 hours. 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 Follow up esophograms have shown no anastomotic leak. Tolerating clear liquids PO.  He has declined participation in PT/OT despite encouragement to do so from multiple team members.    -Chylous left pleural effusion- Continues to have high volume left CT output. Transitioned to TPN on 11/21. Had lymphangiogram by IR on 11/18 but cannulation and embolization of the thoracic was not successful.  Case discussed with IR by Dr Kipp Brood. Plan to continue nutritional support with TPN this week then re-attempt the lymphangiogram / embolization of thoracic duct tomorrow 12/3. If this is not successful, Dr. Kipp Brood plans surgical ligation of the thoracic duct.   -ID-Leukocytosis resolved, no further fevers. J-tube site has some mild inflammation but does not appear infected. 21-day Augmentin course completed yesterday.  -Renal- stable function.   -Anemia- Expected with prolonged illness, no obvious losses and currently no indication for transfusion. Will monitor  -DVT PPX- on SQ enoxaparin daily.    [name], PA-C [phone]   Agree with above [place]

## 2020-04-06 NOTE — Progress Notes (Addendum)
PHARMACY - TOTAL PARENTERAL NUTRITION CONSULT NOTE  Indication: Chylothorax  Patient Measurements: Height: 5\' 11"  (180.3 cm) Weight: 88.6 kg (195 lb 5.2 oz) IBW/kg (Calculated) : 75.3 TPN AdjBW (KG): 81.4 Body mass index is 27.24 kg/m. Usual Weight: 118-122 kg prior to cancer dx, now 85.8 kg  Assessment:  26 YOM with esophageal cancer presented on 02/24/20 for esophagectomy and J-tube placement.  Patient was started on TF post-op and was briefly trialed on a clear liquid diet.  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 Patient underwent lymphangiogram and thoracic duct fenestration on 03/23/20 - unable to cannulate the thoracic duct. Pharmacy consulted to manage TPN to allow for healing as low-fat, MCT TF formula has not been successful.  Glucose / Insulin: no hx DM - CBGs <140.  Utilized 6 units SSI in last 24hrs. 35 units of regular insulin removed from TPN 11/27 (none in bag prior to steroids 11/23-11/26).  Electrolytes: Na 135. Mg 1.7. Phos 2.8. Corrected Ca 9.42. Other electrolytes wnl. Renal: SCr < 1,  BUN WNL. Off Lasix IV on 11/17 LFTs / TGs: LFTs normalized. T bili 0.4. TG 55 on 11/29. Prealbumin / albumin: prealbumin 15.4 > 16.7, albumin 1.6.  Intake / Output; MIVF: UOP 1 ml/kg/hr, CT O/P 2875mL. LBM 11/25 GI Imaging: none since TPN  Surgeries / Procedures: none since TPN  Central access: PICC placed 03/24/20 TPN start date: 03/24/20  Nutritional Goals (per RD updated rec 11/30): 2800-3000 kCal, 190-210g protein, >/= 2.2L fluid per day  Current Nutrition:  Clear liquid diet: 25-50% of meals TPN  Plan:  Continue TPN at goal rate of 130 ml/hr, providing 190g AA, 374g CHO and 87g SMOFlipids, for a total 2908 kCal, meeting 100% of patient needs. Electrolytes in TPN: continue max Na 144mEq/L (since 11/29), 15 mEq/L of K, 1.5 mEq/L of Ca, increase phos to 10 mmol/L, increase Mg to 14 mEq/L.  Continue Cl:Ac 2:1 Add standard MVI and trace elements to TPN Continue moderate SSI Q4H Standard TPN labs on Mon/Thurs, chest tube output, I/O's Plan for lymphangiogram/embolization of thoracic duct on 12/3  Thank you for involving pharmacy in this patient's care.  Cristela Felt, PharmD Clinical Pharmacist  04/06/2020 7:20 AM  **Pharmacist phone directory can be found on [email or web address] listed under [place]**   Addendum: TPN must be changed from Travasol to [place] due to unavailability of Travasol. - Adjusted TPN formula to use Clinisol 15% instead of Travasol 10% - Start at new goal rate of 105 mL/hr - This TPN will provide 199g of AA, 353g of dextrose, and 85g of SMOFlipids for a total of 2852 kCal meeting 100% of patient needs.  - Adjusted electrolytes in TPN to keep approximate same total concentration: 18 mEq/L of K, 1.8 mEq/L of Ca, 169mEq/L of Na - Increased electrolytes in TPN: 15 mEq/L of Mg, 12 mmol/L of Phos - Give 1g of Mg

## 2020-04-06 NOTE — Consult Note (Signed)
[facility] CM Inpatient Consult   04/06/2020  Brett Dawson 01/25/1956 [id number]  Follow Up:  Active with [facility] RNCM   [employer] insurance member  Chart review for progress with PT/OT for disposition needs.    Plan: Continue to follow progress and notes with inpatient [facility] team for progression for disposition/transition of care needs for post hospital.  For questions,  [name], RN BSN CCM [facility]  [phone] business mobile phone Toll free office [phone]  Fax number: [phone] [email or web address]

## 2020-04-07 ENCOUNTER — Inpatient Hospital Stay (HOSPITAL_COMMUNITY): Payer: 59 | Admitting: Anesthesiology

## 2020-04-07 ENCOUNTER — Other Ambulatory Visit (HOSPITAL_COMMUNITY): Payer: Self-pay

## 2020-04-07 ENCOUNTER — Inpatient Hospital Stay (HOSPITAL_COMMUNITY): Payer: 59

## 2020-04-07 ENCOUNTER — Encounter (HOSPITAL_COMMUNITY)
Admission: RE | Disposition: A | Discharge status: Home Health | Payer: Self-pay | Source: Home / Self Care | Attending: Thoracic Surgery (Cardiothoracic Vascular Surgery)

## 2020-04-07 ENCOUNTER — Encounter (HOSPITAL_COMMUNITY): Payer: Self-pay | Admitting: Thoracic Surgery (Cardiothoracic Vascular Surgery)

## 2020-04-07 HISTORY — PX: IR US GUIDE VASC ACCESS RIGHT: IMG2390

## 2020-04-07 HISTORY — PX: IR EMBO ART  VEN HEMORR LYMPH EXTRAV  INC GUIDE ROADMAPPING: IMG5450

## 2020-04-07 HISTORY — PX: RADIOLOGY WITH ANESTHESIA: SHX6223

## 2020-04-07 HISTORY — PX: IR US GUIDE VASC ACCESS LEFT: IMG2389

## 2020-04-07 HISTORY — PX: IR LYMPHANGIOGRAM PEL/ABD BILAT: IMG5782

## 2020-04-07 LAB — MAGNESIUM: Magnesium: 1.9 mg/dL (ref 1.7–2.4)

## 2020-04-07 LAB — CBC
HCT: 26.2 % — ABNORMAL LOW (ref 39.0–52.0)
Hemoglobin: 8.7 g/dL — ABNORMAL LOW (ref 13.0–17.0)
MCH: 30.2 pg (ref 26.0–34.0)
MCHC: 33.2 g/dL (ref 30.0–36.0)
MCV: 91 fL (ref 80.0–100.0)
Platelets: 221 10*3/uL (ref 150–400)
RBC: 2.88 MIL/uL — ABNORMAL LOW (ref 4.22–5.81)
RDW: 17.1 % — ABNORMAL HIGH (ref 11.5–15.5)
WBC: 6 10*3/uL (ref 4.0–10.5)
nRBC: 0 % (ref 0.0–0.2)

## 2020-04-07 LAB — BASIC METABOLIC PANEL
Anion gap: 8 (ref 5–15)
BUN: 14 mg/dL (ref 8–23)
CO2: 25 mmol/L (ref 22–32)
Calcium: 7.5 mg/dL — ABNORMAL LOW (ref 8.9–10.3)
Chloride: 101 mmol/L (ref 98–111)
Creatinine, Ser: 0.35 mg/dL — ABNORMAL LOW (ref 0.61–1.24)
GFR, Estimated: >60 mL/min (ref >60)
Glucose, Bld: 127 mg/dL — ABNORMAL HIGH (ref 70–99)
Potassium: 3.5 mmol/L (ref 3.5–5.1)
Sodium: 134 mmol/L — ABNORMAL LOW (ref 135–145)

## 2020-04-07 LAB — PROTIME-INR
INR: 1.3 — ABNORMAL HIGH (ref 0.8–1.2)
Prothrombin Time: 15.6 seconds — ABNORMAL HIGH (ref 11.4–15.2)

## 2020-04-07 LAB — GLUCOSE, CAPILLARY
Glucose-Capillary: 115 mg/dL — ABNORMAL HIGH (ref 70–99)
Glucose-Capillary: 123 mg/dL — ABNORMAL HIGH (ref 70–99)
Glucose-Capillary: 133 mg/dL — ABNORMAL HIGH (ref 70–99)
Glucose-Capillary: 151 mg/dL — ABNORMAL HIGH (ref 70–99)
Glucose-Capillary: 183 mg/dL — ABNORMAL HIGH (ref 70–99)
Glucose-Capillary: 184 mg/dL — ABNORMAL HIGH (ref 70–99)

## 2020-04-07 LAB — PHOSPHORUS: Phosphorus: 2.8 mg/dL (ref 2.5–4.6)

## 2020-04-07 SURGERY — IR WITH ANESTHESIA
Anesthesia: General

## 2020-04-07 MED ORDER — CHLORHEXIDINE GLUCONATE 0.12 % MT SOLN
OROMUCOSAL | Status: AC
  Administered 2020-04-07: 15 mL via OROMUCOSAL
  Filled 2020-04-07: qty 15

## 2020-04-07 MED ORDER — PROPOFOL 10 MG/ML IV BOLUS
INTRAVENOUS | Status: DC | PRN
  Administered 2020-04-07: 160 mg via INTRAVENOUS

## 2020-04-07 MED ORDER — MIDAZOLAM HCL 5 MG/5ML IJ SOLN
INTRAMUSCULAR | Status: DC | PRN
  Administered 2020-04-07: 2 mg via INTRAVENOUS

## 2020-04-07 MED ORDER — DEXAMETHASONE SODIUM PHOSPHATE 10 MG/ML IJ SOLN
INTRAMUSCULAR | Status: DC | PRN
  Administered 2020-04-07: 5 mg via INTRAVENOUS

## 2020-04-07 MED ORDER — LIDOCAINE 2% (20 MG/ML) 5 ML SYRINGE
INTRAMUSCULAR | Status: DC | PRN
  Administered 2020-04-07: 100 mg via INTRAVENOUS

## 2020-04-07 MED ORDER — ONDANSETRON HCL 4 MG/2ML IJ SOLN: 4.0000 mg | Freq: Once | INTRAMUSCULAR | Status: DC | PRN

## 2020-04-07 MED ORDER — MEPERIDINE HCL 25 MG/ML IJ SOLN: 6.2500 mg | INTRAMUSCULAR | Status: DC | PRN

## 2020-04-07 MED ORDER — SODIUM CHLORIDE 0.9 % IV SOLN: INTRAVENOUS | Status: DC | PRN

## 2020-04-07 MED ORDER — PHENYLEPHRINE HCL (PRESSORS) 10 MG/ML IV SOLN
INTRAVENOUS | Status: DC | PRN
  Administered 2020-04-07: 120 ug via INTRAVENOUS
  Administered 2020-04-07: 160 ug via INTRAVENOUS
  Administered 2020-04-07: 120 ug via INTRAVENOUS

## 2020-04-07 MED ORDER — ONDANSETRON HCL 4 MG/2ML IJ SOLN
INTRAMUSCULAR | Status: DC | PRN
  Administered 2020-04-07: 4 mg via INTRAVENOUS

## 2020-04-07 MED ORDER — FENTANYL CITRATE (PF) 100 MCG/2ML IJ SOLN
INTRAMUSCULAR | Status: DC | PRN
  Administered 2020-04-07: 100 ug via INTRAVENOUS

## 2020-04-07 MED ORDER — ROCURONIUM BROMIDE 10 MG/ML (PF) SYRINGE
PREFILLED_SYRINGE | INTRAVENOUS | Status: DC | PRN
  Administered 2020-04-07 (×2): 50 mg via INTRAVENOUS

## 2020-04-07 MED ORDER — ORAL CARE MOUTH RINSE: 15.0000 mL | Freq: Once | OROMUCOSAL | Status: AC

## 2020-04-07 MED ORDER — MIDAZOLAM HCL 2 MG/2ML IJ SOLN
INTRAMUSCULAR | Status: AC
  Filled 2020-04-07: qty 2

## 2020-04-07 MED ORDER — IOHEXOL 300 MG/ML  SOLN
100.0000 mL | Freq: Once | INTRAMUSCULAR | Status: AC | PRN
  Administered 2020-04-07: 10 mL via INTRAVENOUS

## 2020-04-07 MED ORDER — ACETAMINOPHEN 160 MG/5ML PO SOLN: 325.0000 mg | ORAL | Status: DC | PRN

## 2020-04-07 MED ORDER — ACETAMINOPHEN 325 MG PO TABS: 325.0000 mg | ORAL_TABLET | ORAL | Status: DC | PRN

## 2020-04-07 MED ORDER — CHLORHEXIDINE GLUCONATE 0.12 % MT SOLN: 15.0000 mL | Freq: Once | OROMUCOSAL | Status: AC

## 2020-04-07 MED ORDER — OXYCODONE HCL 5 MG/5ML PO SOLN: 5.0000 mg | Freq: Once | ORAL | Status: DC | PRN

## 2020-04-07 MED ORDER — PHENYLEPHRINE HCL-NACL 10-0.9 MG/250ML-% IV SOLN
INTRAVENOUS | Status: DC | PRN
  Administered 2020-04-07: 25 ug/min via INTRAVENOUS

## 2020-04-07 MED ORDER — FENTANYL CITRATE (PF) 100 MCG/2ML IJ SOLN: 25.0000 ug | INTRAMUSCULAR | Status: DC | PRN

## 2020-04-07 MED ORDER — OXYCODONE HCL 5 MG PO TABS: 5.0000 mg | ORAL_TABLET | Freq: Once | ORAL | Status: DC | PRN

## 2020-04-07 MED ORDER — SUGAMMADEX SODIUM 200 MG/2ML IV SOLN
INTRAVENOUS | Status: DC | PRN
  Administered 2020-04-07: 200 mg via INTRAVENOUS

## 2020-04-07 MED ORDER — TRACE MINERALS CU-MN-SE-ZN 300-55-60-3000 MCG/ML IV SOLN
INTRAVENOUS | Status: AC
  Filled 2020-04-07: qty 1327.2

## 2020-04-07 MED ORDER — IOHEXOL 300 MG/ML  SOLN: 100.0000 mL | Freq: Once | INTRAMUSCULAR | Status: DC | PRN

## 2020-04-07 MED ORDER — FENTANYL CITRATE (PF) 250 MCG/5ML IJ SOLN
INTRAMUSCULAR | Status: AC
  Filled 2020-04-07: qty 5

## 2020-04-07 NOTE — Plan of Care (Signed)

## 2020-04-07 NOTE — Anesthesia Postprocedure Evaluation (Signed)
Anesthesia Post Note  Patient: Brett Dawson  Procedure(s) Performed: IR WITH ANESTHESIA LYMPHANGIOGRAM BILATERAL WITH EMBOLIZATION (N/A )     Patient location during evaluation: PACU Anesthesia Type: General Level of consciousness: awake and alert Pain management: pain level controlled Vital Signs Assessment: post-procedure vital signs reviewed and stable Respiratory status: spontaneous breathing, nonlabored ventilation, respiratory function stable and patient connected to nasal cannula oxygen Cardiovascular status: blood pressure returned to baseline and stable Postop Assessment: no apparent nausea or vomiting Anesthetic complications: no   No complications documented.  Last Vitals:  Vitals:   04/07/20 1322 04/07/20 1330  BP:  97/61  Pulse: 82 (!) 109  Resp: 18 19  Temp: 36.8 C 36.9 C  SpO2: 90% 90%    Last Pain:  Vitals:   04/07/20 1330  TempSrc: Oral  PainSc:                  [place]

## 2020-04-07 NOTE — TOC Progression Note (Signed)
Transition of Care [facility]) - Progression Note    Patient Details  Name: YONG GRIESER MRN: [id number] Date of Birth: 12-12-1955  Transition of [facility]) CM/SW Contact  Zenon Mayo, RN Phone Number: 04/07/2020, 6:02 PM  Clinical Narrative:    NCM spoke with patient at bedside, offered choice for HHPT, HHRN he states he does not have a preference.  NCM made referral to [place] with [facility], awaiting to hear back to see if she can take referral for HHPT and [place],  NCM also contacted Carolynn Sayers regarding tube feeds for home.  Per Junie Panning with [facility] she can take referral for [facility] [place],     12/3- for lymphangiogram today and schedule embolization, reassess on Monday.  TOC cont to follow .        Expected Discharge Plan and Services                                                 Social Determinants of Health (SDOH) Interventions    Readmission Risk Interventions No flowsheet data found.

## 2020-04-07 NOTE — Anesthesia Procedure Notes (Signed)
Procedure Name: Intubation Date/Time: 04/07/2020 9:29 AM Performed by: Inda Coke, CRNA Pre-anesthesia Checklist: Patient identified, Emergency Drugs available, Suction available and Patient being monitored Patient Re-evaluated:Patient Re-evaluated prior to induction Oxygen Delivery Method: Circle System Utilized Preoxygenation: Pre-oxygenation with 100% oxygen Induction Type: IV induction Ventilation: Mask ventilation without difficulty Laryngoscope Size: Mac and 4 Grade View: Grade II Tube type: Oral Tube size: 7.5 mm Number of attempts: 1 Airway Equipment and Method: Stylet and Oral airway Placement Confirmation: ETT inserted through vocal cords under direct vision,  positive ETCO2 and breath sounds checked- equal and bilateral Secured at: 23 cm Tube secured with: Tape Dental Injury: Teeth and Oropharynx as per pre-operative assessment

## 2020-04-07 NOTE — Progress Notes (Addendum)
      [address]       [place] [address]             [phone]      Subjective:  Awake and alert, hoping for success with the IR procedure today.  No new complaints or concerns.     TPN infusing per PICC.    Objective: Vital signs in last 24 hours: Temp:  [98 F (36.7 C)-99.9 F (37.7 C)] 98.3 F (36.8 C) (12/03 0458) Pulse Rate:  [78-85] 84 (12/03 0458) Cardiac Rhythm: Normal sinus rhythm (12/03 0704) Resp:  [17-20] 18 (12/03 0458) BP: (101-110)/(56-67) 104/59 (12/03 0458) SpO2:  [89 %-91 %] 91 % (12/03 0458)   Intake/Output from previous day: 12/02 0701 - 12/03 0700 In: 3118.8 [P.O.:120; I.V.:2998.8] Out: 4310 [Urine:2950; Chest Tube:1360] Intake/Output this shift:  General appearance: alert, cooperative and mild distress. Seems a little more hopeful today.  Heart: regular rate and rhythm Lungs: breath sounds are clear. The left pleural drain had >1.3Lml  milky fluid past 24 hours.  Abdomen: soft, NT.   Extremities: no peripheral edema    Lab Results: Recent Labs    04/05/20 0500 04/07/20 0046  WBC 6.1 6.0  HGB 8.4* 8.7*  HCT 26.4* 26.2*  PLT 197 221   BMET:  Recent Labs    04/06/20 0415 04/07/20 0046  NA 135 134*  K 3.7 3.5  CL 101 101  CO2 28 25  GLUCOSE 123* 127*  BUN 13 14  CREATININE 0.43* 0.35*  CALCIUM 7.5* 7.5*    PT/INR:  Recent Labs    04/07/20 0046  LABPROT 15.6*  INR 1.3*   ABG    Component Value Date/Time   PHART 7.454 (H) 02/25/2020 0500   HCO3 26.5 02/25/2020 0500   TCO2 27 02/24/2020 1944   ACIDBASEDEF 1.0 02/24/2020 1547   O2SAT 93.7 02/25/2020 0500   CBG (last 3)  Recent Labs    04/06/20 1940 04/06/20 2333 04/07/20 0457  GLUCAP 117* 121* 115*    Assessment/Plan: S/P Procedure(s) (LRB): Malignant neoplasm of esophagus (N/A)    -POD-43 robotic assisted Ivor-Lewis esophagectomy for distal esophageal adenocarcinoma. Follow up esophograms have shown no anastomotic leak. Tolerating  small amounts of clear liquids PO.  He has declined participation in PT/OT despite encouragement to do so from multiple team members.   -Chylous left pleural effusion- Continues to have high volume left CT output. Transitioned to TPN on 11/21. Had lymphangiogram by IR on 11/18 but cannulation and embolization of the thoracic was not successful. He has been supported nutritionally since then. Plan repeat  lymphangiogram / embolization of thoracic duct today. If this is not successful, Dr. Kipp Brood plans surgical ligation of the thoracic duct.   -ID-Leukocytosis resolved, no further fevers. J-tube site has some mild inflammation but does not appear infected. Now off antibiotics.  -Renal- stable function.   -Anemia- Expected with prolonged illness, no obvious losses and currently no indication for transfusion. Will monitor  -DVT PPX- on SQ enoxaparin daily.    [name], PA-C [phone]  Agree with above. Successful embolization of his thoracic duct. We will start on diet on Monday. We will keep on TPN through the weekend. Continue chest tube drainage for now.  [name]

## 2020-04-07 NOTE — Procedures (Signed)
Interventional Radiology Procedure Note  Procedure: Bilateral lymphangiogram and thoracic duct embolization  Complications: None  Estimated Blood Loss: None  Recommendations: - Bedrest x 4 hrs - May resume diet as pleural effusion resolves   Signed,  Criselda Peaches, MD

## 2020-04-07 NOTE — Progress Notes (Signed)
PHARMACY - TOTAL PARENTERAL NUTRITION CONSULT NOTE  Indication: Chylothorax  Patient Measurements: Height: 5\' 11"  (180.3 cm) Weight: 88.6 kg (195 lb 5.2 oz) IBW/kg (Calculated) : 75.3 TPN AdjBW (KG): 81.4 Body mass index is 27.24 kg/m. Usual Weight: 118-122 kg prior to cancer dx, now 85.8 kg  Assessment:  53 YOM with esophageal cancer presented on 02/24/20 for esophagectomy and J-tube placement.  Patient was started on TF post-op and was briefly trialed on a clear liquid diet.  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 Patient underwent lymphangiogram and thoracic duct fenestration on 03/23/20 - unable to cannulate the thoracic duct. Pharmacy consulted to manage TPN to allow for healing as low-fat, MCT TF formula has not been successful.  Glucose / Insulin: no hx DM - CBGs <140.  Utilized 6 units SSI in last 24hrs. Regular insulin removed from TPN 11/27 (none in bag prior to steroids 11/23-11/26).  Electrolytes: Na 134. Mg 1.9 (received 1g Mg 12/1). Phos 2.8. Corrected Ca 9.42. Other electrolytes wnl. Renal: SCr < 1,  BUN WNL  LFTs / TGs: LFTs normalized. T bili 0.4. TG 55 on 11/29. Prealbumin / albumin: prealbumin 15.4 > 16.7, albumin 1.6.  Intake / Output; MIVF: UOP 1.4 ml/kg/hr, CT O/P 2872mL> 1360. LBM documented 11/25 GI Imaging: none since TPN  Surgeries / Procedures: none since TPN  Central access: PICC placed 03/24/20 TPN start date: 03/24/20  Nutritional Goals (per RD updated rec 11/30): 2800-3000 kCal, 190-210g protein, >/= 2.2L fluid per day  Current Nutrition:  Clear liquid diet: 0% documented TPN  Plan:  12/1 changed from Travasol to [place] 15% due to unavailability of Travasol > Continue TPN at new goal rate of 105 ml/hr, providing 199g of AA, 353g of dextrose, and 85g of SMOFlipids for a total of 2852 kCal meeting 100% of patient needs Electrolytes in TPN: 118mEq/L of Na, 1.8 mEq/L of  Ca, 15 mEq/L of Mg; increase to 21 mEq/L of K, 14 mmol/L of Phos Add standard MVI and trace elements to TPN Continue moderate SSI Q4H Standard TPN labs on Mon/Thurs, chest tube output, I/O's Plan for lymphangiogram/embolization of thoracic duct  12/3  Thank you for involving pharmacy in this patient's care.  Benetta Spar, PharmD, BCPS, BCCP Clinical Pharmacist  Please check AMION for all [place] phone numbers After 10:00 PM, call [place] [phone]

## 2020-04-07 NOTE — Transfer of Care (Signed)
Immediate Anesthesia Transfer of Care Note  Patient: Brett Dawson  Procedure(s) Performed: IR WITH ANESTHESIA LYMPHANGIOGRAM BILATERAL WITH EMBOLIZATION (N/A )  Patient Location: PACU  Anesthesia Type:General  Level of Consciousness: awake and alert   Airway & Oxygen Therapy: Patient Spontanous Breathing and Patient connected to face mask oxygen  Post-op Assessment: Report given to RN and Post -op Vital signs reviewed and stable  Post vital signs: Reviewed and stable  Last Vitals:  Vitals Value Taken Time  BP 104/55 04/07/20 1208  Temp    Pulse 84 04/07/20 1214  Resp 17 04/07/20 1214  SpO2 94 % 04/07/20 1214  Vitals shown include unvalidated device data.  Last Pain:  Vitals:   04/07/20 0757  TempSrc: Oral  PainSc:       Patients Stated Pain Goal: 0 (64/18/93 7374)  Complications: No complications documented.

## 2020-04-07 NOTE — Progress Notes (Signed)
Patient Status: [name] - In-pt  Assessment and Plan: Patient scheduled for lymphangiogram with embolization for ongoing chylous effusion.  Patient seen at bedside this AM.   He has been updated on plan today.  He is agreeable to proceed.  NPO this AM.   Risks and benefits of cerebral angiogram with intervention were discussed with the patient including, but not limited to bleeding, infection, vascular injury, contrast induced renal failure, stroke or even death.  This interventional procedure involves the use of X-rays and because of the nature of the planned procedure, it is possible that we will have prolonged use of X-ray fluoroscopy.  Potential radiation risks to you include (but are not limited to) the following: - A slightly elevated risk for cancer  several years later in life. This risk is typically less than 0.5% percent. This risk is low in comparison to the normal incidence of human cancer, which is 33% for women and 50% for men according to the [place]. - Radiation induced injury can include skin redness, resembling a rash, tissue breakdown / ulcers and hair loss (which can be temporary or permanent).   The likelihood of either of these occurring depends on the difficulty of the procedure and whether you are sensitive to radiation due to previous procedures, disease, or genetic conditions.   IF your procedure requires a prolonged use of radiation, you will be notified and given written instructions for further action.  It is your responsibility to monitor the irradiated area for the 2 weeks following the procedure and to notify your physician if you are concerned that you have suffered a radiation induced injury.    All of the patient's questions were answered, patient is agreeable to proceed.  Consent signed and in chart. ______________________________________________________________________   History of Present Illness: Brett Dawson is a 64 y.o. 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  He underwent lymphangiogram with attempted embolization 03/23/20, however duct could not be fenestrated.  He is scheduled for re-attempt today  Allergies and medications reviewed.   Review of Systems: A 12 point ROS discussed and pertinent positives are indicated in the HPI above.  All other systems are negative.  Review of Systems  Constitutional: Negative for fatigue and fever.  Respiratory: Positive for shortness of breath. Negative for cough.   Cardiovascular: Negative for chest pain.  Gastrointestinal: Negative for abdominal pain, diarrhea, nausea and vomiting.  Musculoskeletal: Negative for back pain.  Psychiatric/Behavioral: Negative for behavioral problems and confusion.    Vital Signs: BP 103/61 (BP Location: Left Arm)   Pulse 82   Temp 97.8 F (36.6 C) (Oral)   Resp 16   Ht 5\' 11"  (1.803 m)   Wt 195 lb 5.2 oz (88.6 kg)   SpO2 90%   BMI 27.24 kg/m   Physical Exam Vitals and nursing note reviewed.  Constitutional:      General: He is not in acute distress.    Appearance: Normal appearance. He is not ill-appearing.  HENT:     Mouth/Throat:     Mouth: Mucous membranes are moist.     Pharynx: Oropharynx is clear.  Cardiovascular:     Rate and Rhythm: Normal rate and regular rhythm.  Pulmonary:     Effort: Pulmonary effort is normal.     Comments: Chest tube in place with significant output Skin:    General: Skin is warm and dry.  Neurological:  General: No focal deficit present.     Mental Status: He is alert and oriented to person, place, and time.      Imaging reviewed.   Labs:  COAGS: Recent Labs    02/22/20 0830 03/08/20 0015 03/23/20 0446 04/07/20 0046  INR 1.0 1.1 1.2 1.3*  APTT 30  --   --   --     BMP: Recent Labs    12/27/19 0810 12/27/19 0810 01/03/20 0813 01/03/20 0813 01/11/20 9381  01/11/20 0814 01/26/20 1426 02/22/20 0830 04/01/20 0340 04/03/20 0530 04/06/20 0415 04/07/20 0046  NA 136   < > 140   < > 139   < > 140   < > 135 134* 135 134*  K 4.0   < > 3.9   < > 4.2   < > 4.2   < > 4.2 3.6 3.7 3.5  CL 107   < > 106   < > 105   < > 106   < > 104 99 101 101  CO2 23   < > 27   < > 29   < > 29   < > 24 27 28 25   GLUCOSE 117*   < > 127*   < > 105*   < > 99   < > 124* 131* 123* 127*  BUN 14   < > 12   < > 11   < > 10   < > 25* 17 13 14   CALCIUM 9.4   < > 9.4   < > 9.0   < > 8.8*   < > 8.3* 7.7* 7.5* 7.5*  CREATININE 0.66   < > 0.63   < > 0.68   < > 0.71   < > 0.45* 0.46* 0.43* 0.35*  GFRNONAA >60   < > >60   < > >60   < > >60   < > >60 >60 >60 >60  GFRAA >60  --  >60  --  >60  --  >60  --   --   --   --   --    < > = values in this interval not displayed.       Electronically Signed: Docia Barrier, PA 04/07/2020, 9:18 AM   I spent a total of 15 minutes in face to face in clinical consultation, greater than 50% of which was counseling/coordinating care for venous access.

## 2020-04-08 ENCOUNTER — Inpatient Hospital Stay (HOSPITAL_COMMUNITY): Payer: 59

## 2020-04-08 LAB — GLUCOSE, CAPILLARY
Glucose-Capillary: 152 mg/dL — ABNORMAL HIGH (ref 70–99)
Glucose-Capillary: 141 mg/dL — ABNORMAL HIGH (ref 70–99)
Glucose-Capillary: 122 mg/dL — ABNORMAL HIGH (ref 70–99)
Glucose-Capillary: 128 mg/dL — ABNORMAL HIGH (ref 70–99)

## 2020-04-08 MED ORDER — TRAVASOL 10 % IV SOLN
INTRAVENOUS | Status: AC
  Filled 2020-04-08: qty 1903.2

## 2020-04-08 MED ORDER — INSULIN ASPART 100 UNIT/ML ~~LOC~~ SOLN
0.0000 [IU] | Freq: Four times a day (QID) | SUBCUTANEOUS | Status: DC
  Administered 2020-04-09: 3 [IU] via SUBCUTANEOUS
  Administered 2020-04-09: 2 [IU] via SUBCUTANEOUS

## 2020-04-08 NOTE — Progress Notes (Signed)
Referring Physician(s): Gerhardt,E  Supervising Physician: Markus Daft  Patient Status:  [facility] - In-pt  Chief Complaint:  Left chylous pleural effusion  Subjective: Pt doing well this am; denies worsening dyspnea, CP, abd pain,N/V; remains constipated   Allergies: Patient has no known allergies.  Medications: Prior to Admission medications   Medication Sig Start Date End Date Taking? Authorizing Provider  Colchicine 0.6 MG CAPS Take 0.6 mg by mouth 2 (two) times daily as needed (gout).  11/02/19  Yes [provider]  FEROSUL 325 (65 Fe) MG tablet Take 1 tablet (325 mg total) by mouth daily. Patient not taking: Reported on 02/21/2020 12/13/19   [name], MD  ondansetron (ZOFRAN) 8 MG tablet Take 1 tablet (8 mg total) by mouth 2 (two) times daily as needed for refractory nausea / vomiting. Start on day 3 after chemo. 11/27/19   [name], MD  prochlorperazine (COMPAZINE) 10 MG tablet TAKE 1 TABLET(10 MG) BY MOUTH EVERY 6 HOURS AS NEEDED FOR NAUSEA OR VOMITING Patient taking differently: Take 10 mg by mouth every 6 (six) hours as needed for nausea or vomiting.  12/13/19   [name], MD  sucralfate (CARAFATE) 1 g tablet Take 1 tablet (1 g total) by mouth 4 (four) times daily. Dissolve each tablet in 15 cc water before use. Patient taking differently: Take 1 g by mouth 4 (four) times daily as needed (throat pain). Dissolve each tablet in 15 cc water before use. 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 He underwent a first attempt at lymphangiography and thoracic duct embolization on 03/23/2020. Lymphangiography and fenestration of the thoracic duct were successful. However, the thoracic duct was not able to be accessed and embolized at that time. Subsequently, high volume chylous effusion output has persisted. Therefore, patient presents today for a second attempt. Dr. Corrie Mckusick assisted throughout the procedure. EXAM: IR EMBO ART VEN HEMORR LYMPH EXTRAV INC GUIDE ROADMAPPING; LYMPHANGIOGRAM; IR ULTRASOUND GUIDANCE VASC ACCESS LEFT MEDICATIONS: None. ANESTHESIA/SEDATION: General anesthesia provided by the anesthesiology service CONTRAST:  70mL OMNIPAQUE IOHEXOL 300 MG/ML  SOLN FLUOROSCOPY TIME:  Fluoroscopy Time: 24 minutes 12 seconds (971 mGy). COMPLICATIONS: None immediate. PROCEDURE: Informed consent was obtained from the patient following explanation of the procedure, risks, benefits and alternatives. The patient understands, agrees and consents for the procedure. All questions were addressed. A time out was performed prior to the initiation of the procedure. Maximal barrier sterile technique utilized including caps, mask, sterile gowns, sterile gloves, large sterile drape, hand hygiene, and Betadine prep. Ultrasound was used to interrogate the left groin. A suitable left inguinal lymph node was identified. Under real-time ultrasound guidance, the hilum of the lymph node was punctured with a 25 gauge spinal needle. Lymphangiography was then attempted. Unfortunately, the lymph node is extremely sclerotic and no Lipiodol could be advanced into the lymphatic system. There was extravasation of  Lipiodol from the lymph node itself. Ultrasound was then used to interrogate the right groin. A suitable right inguinal lymph node was identified. Under real-time ultrasound guidance, the hilum of the lymph node was punctured with a 25 gauge spinal needle.  Lymphangiography was again attempted. Similar to the contralateral side, the sclerotic nature of the lymph node prevented passage of injected contrast material into the lymphatic channels. The lymph node parenchyma opacified and then there was extravasation. At this point, the pelvis was evaluated under fluoroscopy. Several lymph nodes remains stained with Lipiodol from the last lymphangiogram in the external iliac chains bilaterally. A suitable node in the left external iliac chain was selected. Using fluoroscopic guidance, the lymph node was direct punctured with the 25 gauge spinal needle. Lymph angiography was performed. There is excellent filling of a prominent lymphatic channel extending superiorly along the left iliac chain. Lymphangiography was continued in till the thoracic duct could be identified at the T12-L1 level. At this point, the image intensifier was angulated caudally and the thoracic duct was direct punctured with a 22 x 15 cm Chiba needle. A 0.014 Asahi Chikai wire was then advanced into the thoracic duct up to the level of the thoracic inlet. The Chiba needle was then removed. A small skin nick was made at the wire insertion site. A 2.4 French Progreat microcatheter was then advanced over the wire and into the thoracic duct. The wire was removed. Lymphangiography was then performed. Excellent opacification of the thoracic duct. There are multiple small branches arising from the duct along its intrathoracic course. There is also filling of a small channel versus the possible site of leak in the inferior aspect of the left hemithorax, likely at the base of the pleural space. The microcatheter and wire combination were successfully navigated up to the thoracic inlet near where the duct enters the subclavian vein. Coil embolization was then performed using a series of 6, 8 and 10 mm interlock detachable fibered microcoils as well as several packing Ruby low-profile microcoils. Once stasis was  successfully accomplished, true fill embolic glue was prepared in the standard fashion using a 1-1 glue to Lipiodol ratio. The catheter was flushed with D5W, and glue embolization was performed along the entire length of the thoracic duct to the catheter entry site. The catheter was then removed. Final spot images were obtained demonstrating successful combined coil and liquid embolization of the thoracic duct. IMPRESSION: Successful bilateral lymphangiogram and thoracic duct embolization. Electronically Signed   By: Jacqulynn Cadet M.D.   On: 04/07/2020 13:01   IR US Guide Vasc Access Right  Result Date: 04/07/2020 INDICATION: Add-on code, please see original dictation EXAM: IR ULTRASOUND GUIDANCE VASC ACCESS RIGHT MEDICATIONS: Add-on code, please see original dictation ANESTHESIA/SEDATION: Add-on code, please see original dictation CONTRAST:  46mL OMNIPAQUE IOHEXOL 300 MG/ML  SOLN FLUOROSCOPY TIME:  Add-on code, please see original dictation COMPLICATIONS: None immediate. PROCEDURE: Add-on code, please see original dictation IMPRESSION: Add-on code, please see original dictation Electronically Signed   By: Jacqulynn Cadet M.D.   On: 04/07/2020 13:38   DG Chest Port 1 View  Result Date: 04/08/2020 CLINICAL DATA:  LEFT pleural effusion EXAM: PORTABLE CHEST 1 VIEW COMPARISON:  Chest x-ray dated 03/24/2020 FINDINGS: Bilateral pleural effusions, small to moderate in size, likely with associated atelectasis. Pigtail catheter overlies the LEFT lung base, stable in position. No pneumothorax is seen. No new lung findings. Heart size and mediastinal contours are stable. Interval embolic material noted within the upper mediastinum. IMPRESSION:  1. Bilateral pleural effusions, small to moderate in size, likely with associated atelectasis. 2. Pigtail catheter overlies the LEFT lung base, stable in position. Electronically Signed   By: Franki Cabot M.D.   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 He underwent a first attempt at lymphangiography and thoracic duct embolization on 03/23/2020. Lymphangiography and fenestration of the thoracic duct were successful. However, the thoracic duct was not able to be accessed and embolized at that time. Subsequently, high volume chylous effusion output has persisted. Therefore, patient presents today for a second attempt. Dr. Corrie Mckusick assisted throughout the procedure. EXAM: IR EMBO ART VEN HEMORR LYMPH EXTRAV INC GUIDE ROADMAPPING; LYMPHANGIOGRAM; IR ULTRASOUND GUIDANCE VASC ACCESS LEFT MEDICATIONS: None. ANESTHESIA/SEDATION: General anesthesia provided by the anesthesiology service CONTRAST:  66mL OMNIPAQUE IOHEXOL 300 MG/ML  SOLN FLUOROSCOPY TIME:  Fluoroscopy Time: 24 minutes 12 seconds (971 mGy). COMPLICATIONS: None immediate. PROCEDURE: Informed consent was obtained from the patient following explanation of the procedure, risks, benefits and alternatives. The patient understands, agrees and consents for the procedure. All questions were addressed. A time out was performed prior to the initiation of the procedure. Maximal barrier sterile technique utilized including caps, mask, sterile gowns, sterile gloves, large sterile drape, hand hygiene, and Betadine prep. Ultrasound was used to interrogate the left groin. A suitable left inguinal lymph node was identified. Under real-time ultrasound guidance, the hilum of the lymph node was punctured with a 25 gauge spinal needle. Lymphangiography was then attempted. Unfortunately, the lymph node is extremely sclerotic and no Lipiodol could be advanced into the lymphatic system. There was extravasation of Lipiodol from the lymph node itself. Ultrasound was then used to interrogate the right groin. A suitable right inguinal lymph node was identified. Under real-time  ultrasound guidance, the hilum of the lymph node was punctured with a 25 gauge spinal needle. Lymphangiography was again attempted. Similar to the contralateral side, the sclerotic nature of the lymph node prevented passage of injected contrast material into the lymphatic channels. The lymph node parenchyma opacified and then there was extravasation. At this point, the pelvis was evaluated under fluoroscopy. Several lymph nodes remains stained with Lipiodol from the last lymphangiogram in the external iliac chains bilaterally. A suitable node in the left external iliac chain was selected. Using fluoroscopic guidance, the lymph node was direct punctured with the 25 gauge spinal needle. Lymph angiography was performed. There is excellent filling of a prominent lymphatic channel extending superiorly along the left iliac chain. Lymphangiography was continued in till the thoracic duct could be identified at the T12-L1 level. At this point, the image intensifier was angulated caudally and the thoracic duct was direct punctured with a 22 x 15 cm Chiba needle. A 0.014 Asahi Chikai wire was then advanced into the thoracic duct up to the level of the thoracic inlet. The Chiba needle was then removed. A small skin nick was made at the wire insertion site. A 2.4 French Progreat microcatheter was then advanced over the wire and into the thoracic duct. The wire was removed. Lymphangiography was then performed. Excellent opacification of the thoracic duct. There are multiple small branches arising from the duct along its intrathoracic course. There is also filling of a small channel versus the possible site of leak in the inferior aspect of the left hemithorax, likely at the base of the pleural space. The microcatheter and wire combination were successfully  navigated up to the thoracic inlet near where the duct enters the subclavian vein. Coil embolization was then performed using a series of 6, 8 and 10 mm interlock detachable  fibered microcoils as well as several packing Ruby low-profile microcoils. Once stasis was successfully accomplished, true fill embolic glue was prepared in the standard fashion using a 1-1 glue to Lipiodol ratio. The catheter was flushed with D5W, and glue embolization was performed along the entire length of the thoracic duct to the catheter entry site. The catheter was then removed. Final spot images were obtained demonstrating successful combined coil and liquid embolization of the thoracic duct. IMPRESSION: Successful bilateral lymphangiogram and thoracic duct embolization. Electronically Signed   By: Jacqulynn Cadet M.D.   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 He underwent a first attempt at lymphangiography and thoracic duct embolization on 03/23/2020. Lymphangiography and fenestration of the thoracic duct were successful. However, the thoracic duct was not able to be accessed and embolized at that time. Subsequently, high volume chylous effusion output has persisted. Therefore, patient presents today for a second attempt. Dr. Corrie Mckusick assisted throughout the procedure. EXAM: IR EMBO ART VEN HEMORR LYMPH EXTRAV INC GUIDE ROADMAPPING; LYMPHANGIOGRAM; IR ULTRASOUND GUIDANCE VASC ACCESS LEFT MEDICATIONS: None. ANESTHESIA/SEDATION: General anesthesia provided by the anesthesiology service CONTRAST:  63mL OMNIPAQUE IOHEXOL 300 MG/ML  SOLN FLUOROSCOPY TIME:  Fluoroscopy Time: 24 minutes 12 seconds (971 mGy). COMPLICATIONS: None immediate. PROCEDURE: Informed consent was obtained from the patient following explanation of the procedure, risks, benefits and alternatives. The patient understands, agrees and consents for the procedure. All questions were addressed. A time out was performed prior to the initiation of the  procedure. Maximal barrier sterile technique utilized including caps, mask, sterile gowns, sterile gloves, large sterile drape, hand hygiene, and Betadine prep. Ultrasound was used to interrogate the left groin. A suitable left inguinal lymph node was identified. Under real-time ultrasound guidance, the hilum of the lymph node was punctured with a 25 gauge spinal needle. Lymphangiography was then attempted. Unfortunately, the lymph node is extremely sclerotic and no Lipiodol could be advanced into the lymphatic system. There was extravasation of Lipiodol from the lymph node itself. Ultrasound was then used to interrogate the right groin. A suitable right inguinal lymph node was identified. Under real-time ultrasound guidance, the hilum of the lymph node was punctured with a 25 gauge spinal needle. Lymphangiography was again attempted. Similar to the contralateral side, the sclerotic nature of the lymph node prevented passage of injected contrast material into the lymphatic channels. The lymph node parenchyma opacified and then there was extravasation. At this point, the pelvis was evaluated under fluoroscopy. Several lymph nodes remains stained with Lipiodol from the last lymphangiogram in the external iliac chains bilaterally. A suitable node in the left external iliac chain was selected. Using fluoroscopic guidance, the lymph node was direct punctured with the 25 gauge spinal needle. Lymph angiography was performed. There is excellent filling of a prominent lymphatic channel extending superiorly along the left iliac chain. Lymphangiography was continued in till the thoracic duct could be identified at the T12-L1 level. At this point, the image intensifier was angulated caudally and the thoracic duct was direct punctured with a 22 x 15 cm Chiba needle. A 0.014 Asahi Chikai wire was then advanced into the thoracic duct up to the level of the  thoracic inlet. The Chiba needle was then removed. A small skin nick was  made at the wire insertion site. A 2.4 French Progreat microcatheter was then advanced over the wire and into the thoracic duct. The wire was removed. Lymphangiography was then performed. Excellent opacification of the thoracic duct. There are multiple small branches arising from the duct along its intrathoracic course. There is also filling of a small channel versus the possible site of leak in the inferior aspect of the left hemithorax, likely at the base of the pleural space. The microcatheter and wire combination were successfully navigated up to the thoracic inlet near where the duct enters the subclavian vein. Coil embolization was then performed using a series of 6, 8 and 10 mm interlock detachable fibered microcoils as well as several packing Ruby low-profile microcoils. Once stasis was successfully accomplished, true fill embolic glue was prepared in the standard fashion using a 1-1 glue to Lipiodol ratio. The catheter was flushed with D5W, and glue embolization was performed along the entire length of the thoracic duct to the catheter entry site. The catheter was then removed. Final spot images were obtained demonstrating successful combined coil and liquid embolization of the thoracic duct. IMPRESSION: Successful bilateral lymphangiogram and thoracic duct embolization. Electronically Signed   By: Jacqulynn Cadet M.D.   On: 04/07/2020 13:01    Labs:  CBC: Recent Labs    03/27/20 0430 04/03/20 0530 04/05/20 0500 04/07/20 0046  WBC 10.1 9.9 6.1 6.0  HGB 9.2* 8.9* 8.4* 8.7*  HCT 28.7* 27.7* 26.4* 26.2*  PLT 419* 224 197 221    COAGS: Recent Labs    02/22/20 0830 03/08/20 0015 03/23/20 0446 04/07/20 0046  INR 1.0 1.1 1.2 1.3*  APTT 30  --   --   --     BMP: Recent Labs    12/27/19 0810 12/27/19 0810 01/03/20 0813 01/03/20 0813 01/11/20 3419 01/11/20 0814 01/26/20 1426 02/22/20 0830 04/01/20 0340 04/03/20 0530 04/06/20 0415 04/07/20 0046  NA 136   < > 140   < > 139    < > 140   < > 135 134* 135 134*  K 4.0   < > 3.9   < > 4.2   < > 4.2   < > 4.2 3.6 3.7 3.5  CL 107   < > 106   < > 105   < > 106   < > 104 99 101 101  CO2 23   < > 27   < > 29   < > 29   < > 24 27 28 25   GLUCOSE 117*   < > 127*   < > 105*   < > 99   < > 124* 131* 123* 127*  BUN 14   < > 12   < > 11   < > 10   < > 25* 17 13 14   CALCIUM 9.4   < > 9.4   < > 9.0   < > 8.8*   < > 8.3* 7.7* 7.5* 7.5*  CREATININE 0.66   < > 0.63   < > 0.68   < > 0.71   < > 0.45* 0.46* 0.43* 0.35*  GFRNONAA >60   < > >60   < > >60   < > >60   < > >60 >60 >60 >60  GFRAA >60  --  >60  --  >60  --  >60  --   --   --   --   --    < > =  values in this interval not displayed.    LIVER FUNCTION TESTS: Recent Labs    03/31/20 0026 04/01/20 0340 04/03/20 0530 04/06/20 0415  BILITOT 0.2* 0.6 0.5 0.4  AST 18 18 15  14*  ALT 29 28 19 14   ALKPHOS 125 113 88 76  PROT 5.8* 5.5* 5.1* 4.9*  ALBUMIN 1.8* 1.8* 1.7* 1.6*    Assessment and Plan: Pt with hx high volume left sided chylous pleural effusion following esophagectomy; s/p successful bilateral lymphangiogram with thoracic duct coil/liquid embolization 12/3; afebrile; no new labs today; CXR today:  1. Bilateral pleural effusions, small to moderate in size, likely with associated atelectasis. 2. Pigtail catheter overlies the LEFT lung base, stable in position  Cont current tx, monitor chest drain OP closely, follow CXR   Electronically Signed: D. Rowe Robert, PA-C 04/08/2020, 10:54 AM   I spent a total of 15 minutes at the the patient's bedside AND on the patient's hospital floor or unit, greater than 50% of which was counseling/coordinating care for bilateral lymphangiogram with thoracic duct embolization    Patient ID: TRAYQUAN KOLAKOWSKI, male   DOB: [birth year], 64 y.o.   MRN: [id number]

## 2020-04-08 NOTE — Plan of Care (Signed)

## 2020-04-08 NOTE — Progress Notes (Addendum)
      [address]       [place] [address]             [phone]       1 Day Post-Op Procedure(s) (LRB): IR WITH ANESTHESIA LYMPHANGIOGRAM BILATERAL WITH EMBOLIZATION (N/A)  Subjective: Patient has not had a bowel movement in a few days but is not uncomfortable  Objective: Vital signs in last 24 hours: Temp:  [98 F (36.7 C)-98.9 F (37.2 C)] 98 F (36.7 C) (12/04 0747) Pulse Rate:  [64-109] 68 (12/04 0747) Cardiac Rhythm: Normal sinus rhythm (12/04 0800) Resp:  [14-20] 17 (12/04 0747) BP: (94-111)/(48-75) 104/75 (12/04 0747) SpO2:  [90 %-95 %] 91 % (12/04 0747)     Intake/Output from previous day: 12/03 0701 - 12/04 0700 In: 2617.7 [P.O.:237; I.V.:2380.7] Out: 2185 [Urine:900; Blood:5; Chest Tube:1280]   Physical Exam:  Cardiovascular: RRR Pulmonary: Clear to auscultation on the right and diminished left base Abdomen: Soft, non tender, bowel sounds present. Extremities: No lower extremity edema. Wounds: Clean and dry.  No erythema or signs of infection. Chest Tube: with chylous like drainage in Pleura Vac tube this am  Lab Results: NOB:SJGGEZ Labs    04/07/20 0046  WBC 6.0  HGB 8.7*  HCT 26.2*  PLT 221   BMET:  Recent Labs    04/06/20 0415 04/07/20 0046  NA 135 134*  K 3.7 3.5  CL 101 101  CO2 28 25  GLUCOSE 123* 127*  BUN 13 14  CREATININE 0.43* 0.35*  CALCIUM 7.5* 7.5*    PT/INR:  Recent Labs    04/07/20 0046  LABPROT 15.6*  INR 1.3*   ABG:  INR: Will add last result for INR, ABG once components are confirmed Will add last 4 CBG results once components are confirmed  Assessment/Plan:  1. CV - SR 2.  Pulmonary - Chest tube with 1280 cc last 24 hours.CXR this am shows bilateral pleural effusions/atelectasis, pigtail catheter on left without pneumothorax. 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 4. GI-TPN per pharmacy. Clear liquids for now. Likely advance diet to soft diet on  Monday. LOC constipation  Donielle M ZimmermanPA-C 04/08/2020,10:12 AM [phone]   I have seen and examined the patient and agree with the assessment and plan as outlined.  Chest tube drainage decreased since embolization.  Rexene Alberts, MD 04/08/2020 12:58 PM

## 2020-04-08 NOTE — Progress Notes (Signed)
PHARMACY - TOTAL PARENTERAL NUTRITION CONSULT NOTE  Indication: Chylothorax  Patient Measurements: Height: 5\' 11"  (180.3 cm) Weight: 88.6 kg (195 lb 5.2 oz) IBW/kg (Calculated) : 75.3 TPN AdjBW (KG): 81.4 Body mass index is 27.24 kg/m. Usual Weight: 118-122 kg prior to cancer dx, now 85.8 kg  Assessment:  47 YOM with esophageal cancer presented on 02/24/20 for esophagectomy and J-tube placement.  Patient was started on TF post-op and was briefly trialed on a clear liquid diet.  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 Patient underwent lymphangiogram and thoracic duct fenestration on 03/23/20 - unable to cannulate the thoracic duct. Pharmacy consulted to manage TPN to allow for healing as low-fat, MCT TF formula has not been successful.  Glucose / Insulin: no hx DM - CBGs <180.  Utilized 16 units SSI in last 24hrs. Regular insulin removed from TPN 11/27 (none in bag prior to steroids 11/23-11/26).  Electrolytes: 12/3 labs:  Na 134. Mg 1.9 (received 1g Mg 12/1). Phos 2.8. Corrected Ca 9.42. Other electrolytes wnl. Renal: SCr < 1,  BUN WNL  LFTs / TGs: LFTs normalized. T bili 0.4. TG 55 on 11/29. Prealbumin / albumin: prealbumin 15.4 > 16.7, albumin 1.6.  Intake / Output; MIVF: UOP 0.4 ml/kg/hr (none documented 2nd shift), CT O/P 2811mL> 1360 > 2185. LBM documented 11/25 GI Imaging: none since TPN  Surgeries / Procedures: 12/3 BL lymphangiogram and thoracic duct embolization   Central access: PICC placed 03/24/20 TPN start date: 03/24/20  Nutritional Goals (per RD updated rec 11/30): 2800-3000 kCal, 190-210g protein, >/= 2.2L fluid per day  Current Nutrition:  Clear liquid diet: 0% documented TPN  Plan:  Change back to Travasol > TPN at goal rate of 130ml/hr (providing 190g AA, 374g CHO and 87g SMOFlipids, for a total 2908 kCal, meeting 100% of patient needs) Electrolytes in TPN to match current bag:  121mEq/L of Na, 17 mEq/L of K, 1.5 mEq/L of Ca, 14 mEq/L of Mg, 11 mmol/L of Phos Add standard MVI and trace elements to TPN Decrease to moderate SSI Q6H Standard TPN labs on Mon/Thurs, chest tube output, I/O's Plan to start diet on 12/6, f/u TPN plan   Thank you for involving pharmacy in this patient's care.  Benetta Spar, PharmD, BCPS, BCCP Clinical Pharmacist  Please check AMION for all [facility] phone numbers After 10:00 PM, call [place] [phone]

## 2020-04-08 NOTE — Progress Notes (Signed)
1850: [place].

## 2020-04-09 ENCOUNTER — Inpatient Hospital Stay (HOSPITAL_COMMUNITY): Payer: 59

## 2020-04-09 LAB — BASIC METABOLIC PANEL
Anion gap: 8 (ref 5–15)
BUN: 22 mg/dL (ref 8–23)
CO2: 26 mmol/L (ref 22–32)
Calcium: 7.5 mg/dL — ABNORMAL LOW (ref 8.9–10.3)
Chloride: 101 mmol/L (ref 98–111)
Creatinine, Ser: 0.39 mg/dL — ABNORMAL LOW (ref 0.61–1.24)
GFR, Estimated: >60 mL/min (ref >60)
Glucose, Bld: 131 mg/dL — ABNORMAL HIGH (ref 70–99)
Potassium: 3.7 mmol/L (ref 3.5–5.1)
Sodium: 135 mmol/L (ref 135–145)

## 2020-04-09 LAB — GLUCOSE, CAPILLARY
Glucose-Capillary: 121 mg/dL — ABNORMAL HIGH (ref 70–99)
Glucose-Capillary: 123 mg/dL — ABNORMAL HIGH (ref 70–99)
Glucose-Capillary: 133 mg/dL — ABNORMAL HIGH (ref 70–99)
Glucose-Capillary: 147 mg/dL — ABNORMAL HIGH (ref 70–99)
Glucose-Capillary: 155 mg/dL — ABNORMAL HIGH (ref 70–99)

## 2020-04-09 MED ORDER — TRAVASOL 10 % IV SOLN
INTRAVENOUS | Status: AC
  Filled 2020-04-09: qty 1903.2

## 2020-04-09 MED ORDER — INSULIN ASPART 100 UNIT/ML ~~LOC~~ SOLN
0.0000 [IU] | Freq: Two times a day (BID) | SUBCUTANEOUS | Status: DC
  Administered 2020-04-09: 2 [IU] via SUBCUTANEOUS

## 2020-04-09 NOTE — Progress Notes (Addendum)
      [address]       [place] [address]             [phone]       2 Days Post-Op Procedure(s) (LRB): IR WITH ANESTHESIA LYMPHANGIOGRAM BILATERAL WITH EMBOLIZATION (N/A)  Subjective: Patient has had a bowel movement and feels much better.  Objective: Vital signs in last 24 hours: Temp:  [97.9 F (36.6 C)-99.7 F (37.6 C)] 98.4 F (36.9 C) (12/05 0405) Pulse Rate:  [70-102] 81 (12/05 0405) Cardiac Rhythm: Normal sinus rhythm (12/05 0410) Resp:  [17-20] 17 (12/05 0405) BP: (98-113)/(58-74) 105/61 (12/05 0405) SpO2:  [90 %-92 %] 90 % (12/05 0405) Weight:  [91.5 kg] 91.5 kg (12/05 0409)     Intake/Output from previous day: 12/04 0701 - 12/05 0700 In: 2135.6 [P.O.:720; I.V.:1415.6] Out: 2650 [Urine:1500; Chest Tube:1150]   Physical Exam:  Cardiovascular: RRR Pulmonary: Clear to auscultation on the right and diminished left base Abdomen: Soft, non tender, bowel sounds present. Extremities: No lower extremity edema. Wounds: Clean and dry.  No erythema or signs of infection. Chest Tube: with chylous like drainage in Pleura Vac tube this am  Lab Results: CBC: Recent Labs    04/07/20 0046  WBC 6.0  HGB 8.7*  HCT 26.2*  PLT 221   BMET:  Recent Labs    04/07/20 0046 04/09/20 0405  NA 134* 135  K 3.5 3.7  CL 101 101  CO2 25 26  GLUCOSE 127* 131*  BUN 14 22  CREATININE 0.35* 0.39*  CALCIUM 7.5* 7.5*    PT/INR:  Recent Labs    04/07/20 0046  LABPROT 15.6*  INR 1.3*   ABG:  INR: Will add last result for INR, ABG once components are confirmed Will add last 4 CBG results once components are confirmed  Assessment/Plan:  1. CV - SR 2.  Pulmonary - Chest tube with 1150 cc last 24 hours and is decreasing.CXR this am shows bilateral pleural effusions/atelectasis, pigtail catheter on left without pneumothorax. 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 4. GI-TPN per pharmacy. Clear liquids for now.  Likely advance diet to soft diet on Monday.   Donielle M ZimmermanPA-C 04/09/2020,9:41 AM [phone]  I have seen and examined the patient and agree with the assessment and plan as outlined.  Volume of drainage from chest tube somewhat unclear but seems to be decreasing.  Rexene Alberts, MD 04/09/2020 2:14 PM

## 2020-04-09 NOTE — Plan of Care (Signed)
  Problem: Education: Goal: Knowledge of General Education information will improve Description: Including pain rating scale, medication(s)/side effects and non-pharmacologic comfort measures Outcome: Progressing   Problem: Clinical Measurements: Goal: Ability to maintain clinical measurements within normal limits will improve Outcome: Progressing   Problem: Clinical Measurements: Goal: Will remain free from infection Outcome: Progressing   Problem: Clinical Measurements: Goal: Respiratory complications will improve Outcome: Progressing   Problem: Clinical Measurements: Goal: Diagnostic test results will improve Outcome: Progressing

## 2020-04-09 NOTE — Plan of Care (Signed)

## 2020-04-09 NOTE — Progress Notes (Addendum)
PHARMACY - TOTAL PARENTERAL NUTRITION CONSULT NOTE  Indication: Chylothorax  Patient Measurements: Height: 5\' 11"  (180.3 cm) Weight: 91.5 kg (201 lb 11.5 oz) IBW/kg (Calculated) : 75.3 TPN AdjBW (KG): 81.4 Body mass index is 28.13 kg/m. Usual Weight: 118-122 kg prior to cancer dx, now 85.8 kg  Assessment:  89 YOM with esophageal cancer presented on 02/24/20 for esophagectomy and J-tube placement.  Patient was started on TF post-op and was briefly trialed on a clear liquid diet.  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 Patient underwent lymphangiogram and thoracic duct fenestration on 03/23/20 - unable to cannulate the thoracic duct. Pharmacy consulted to manage TPN to allow for healing as low-fat, MCT TF formula has not been successful.  Glucose / Insulin: no hx DM - CBGs <180.  Utilized 5 units SSI in last 24hrs. Regular insulin removed from TPN 11/27 (none in bag prior to steroids 11/23-11/26).  Electrolytes:  12/3 : Mg 1.9 (received 1g Mg 12/1). Phos 2.8. Corrected Ca 9.42. Other electrolytes wnl. Renal: SCr < 1,  BUN WNL  LFTs / TGs: LFTs normalized. T bili 0.4. TG 55 on 11/29. Prealbumin / albumin: prealbumin 15.4 > 16.7, albumin 1.6.  Intake / Output; MIVF: UOP 0.4 ml/kg/hr (none documented 2nd shift), CT O/P 1135ml/24 hr. LBM ~12/1 GI Imaging: none since TPN  Surgeries / Procedures: 12/3 BL lymphangiogram and thoracic duct embolization   Central access: PICC placed 03/24/20 TPN start date: 03/24/20  Nutritional Goals (per RD updated rec 11/30): 2800-3000 kCal, 190-210g protein, >/= 2.2L fluid per day  Current Nutrition:  Clear liquid diet: 0% documented TPN  Plan:  TPN at goal rate of 143ml/hr (providing 190g AA, 374g CHO and 87g SMOFlipids, for a total 2908 kCal, meeting 100% of patient needs) Electrolytes in TPN: 146mEq/L of Na, 17 mEq/L of K, 1.5 mEq/L of Ca, 14 mEq/L of Mg, 11 mmol/L of  Phos Add standard MVI and trace elements to TPN Decrease to moderate SSI Q12H, stop if low use  Standard TPN labs on Mon/Thurs, chest tube output, I/O's Plan to start diet on 12/6, f/u TPN plan   Thank you for involving pharmacy in this patient's care.  Benetta Spar, PharmD, BCPS, BCCP Clinical Pharmacist  Please check AMION for all [place] phone numbers After 10:00 PM, call [place] [phone]

## 2020-04-10 ENCOUNTER — Inpatient Hospital Stay (HOSPITAL_COMMUNITY): Payer: 59

## 2020-04-10 ENCOUNTER — Encounter (HOSPITAL_COMMUNITY): Payer: Self-pay | Admitting: Interventional Radiology

## 2020-04-10 LAB — DIFFERENTIAL
Abs Immature Granulocytes: 0.02 10*3/uL (ref 0.00–0.07)
Basophils Absolute: 0 10*3/uL (ref 0.0–0.1)
Basophils Relative: 0 %
Eosinophils Absolute: 0.1 10*3/uL (ref 0.0–0.5)
Eosinophils Relative: 1 %
Immature Granulocytes: 0 %
Lymphocytes Relative: 3 %
Lymphs Abs: 0.2 10*3/uL — ABNORMAL LOW (ref 0.7–4.0)
Monocytes Absolute: 0.6 10*3/uL (ref 0.1–1.0)
Monocytes Relative: 9 %
Neutro Abs: 5.9 10*3/uL (ref 1.7–7.7)
Neutrophils Relative %: 87 %

## 2020-04-10 LAB — CBC
HCT: 27.3 % — ABNORMAL LOW (ref 39.0–52.0)
Hemoglobin: 8.5 g/dL — ABNORMAL LOW (ref 13.0–17.0)
MCH: 29.1 pg (ref 26.0–34.0)
MCHC: 31.1 g/dL (ref 30.0–36.0)
MCV: 93.5 fL (ref 80.0–100.0)
Platelets: 266 10*3/uL (ref 150–400)
RBC: 2.92 MIL/uL — ABNORMAL LOW (ref 4.22–5.81)
RDW: 18.2 % — ABNORMAL HIGH (ref 11.5–15.5)
WBC: 6.8 10*3/uL (ref 4.0–10.5)
nRBC: 0 % (ref 0.0–0.2)

## 2020-04-10 LAB — GLUCOSE, CAPILLARY
Glucose-Capillary: 129 mg/dL — ABNORMAL HIGH (ref 70–99)
Glucose-Capillary: 133 mg/dL — ABNORMAL HIGH (ref 70–99)
Glucose-Capillary: 124 mg/dL — ABNORMAL HIGH (ref 70–99)
Glucose-Capillary: 133 mg/dL — ABNORMAL HIGH (ref 70–99)

## 2020-04-10 LAB — COMPREHENSIVE METABOLIC PANEL
ALT: 12 U/L (ref 0–44)
AST: 12 U/L — ABNORMAL LOW (ref 15–41)
Albumin: 1.4 g/dL — ABNORMAL LOW (ref 3.5–5.0)
Alkaline Phosphatase: 66 U/L (ref 38–126)
Anion gap: 6 (ref 5–15)
BUN: 19 mg/dL (ref 8–23)
CO2: 27 mmol/L (ref 22–32)
Calcium: 7.5 mg/dL — ABNORMAL LOW (ref 8.9–10.3)
Chloride: 102 mmol/L (ref 98–111)
Creatinine, Ser: 0.4 mg/dL — ABNORMAL LOW (ref 0.61–1.24)
GFR, Estimated: >60 mL/min (ref >60)
Glucose, Bld: 133 mg/dL — ABNORMAL HIGH (ref 70–99)
Potassium: 3.4 mmol/L — ABNORMAL LOW (ref 3.5–5.1)
Sodium: 135 mmol/L (ref 135–145)
Total Bilirubin: 0.3 mg/dL (ref 0.3–1.2)
Total Protein: 4.5 g/dL — ABNORMAL LOW (ref 6.5–8.1)

## 2020-04-10 LAB — SURGICAL PCR SCREEN
MRSA, PCR: NEGATIVE
Staphylococcus aureus: NEGATIVE

## 2020-04-10 LAB — PREALBUMIN: Prealbumin: 10.1 mg/dL — ABNORMAL LOW (ref 18–38)

## 2020-04-10 LAB — MAGNESIUM: Magnesium: 1.9 mg/dL (ref 1.7–2.4)

## 2020-04-10 LAB — TRIGLYCERIDES: Triglycerides: 44 mg/dL (ref <150)

## 2020-04-10 LAB — PHOSPHORUS: Phosphorus: 3.6 mg/dL (ref 2.5–4.6)

## 2020-04-10 MED ORDER — TRAVASOL 10 % IV SOLN
INTRAVENOUS | Status: DC
  Filled 2020-04-10: qty 1903.2

## 2020-04-10 MED ORDER — IOHEXOL 300 MG/ML  SOLN
75.0000 mL | Freq: Once | INTRAMUSCULAR | Status: AC | PRN
  Administered 2020-04-10: 75 mL via INTRAVENOUS

## 2020-04-10 MED ORDER — INSULIN ASPART 100 UNIT/ML ~~LOC~~ SOLN
0.0000 [IU] | Freq: Three times a day (TID) | SUBCUTANEOUS | Status: DC
  Administered 2020-04-10 – 2020-04-11 (×3): 2 [IU] via SUBCUTANEOUS

## 2020-04-10 NOTE — Progress Notes (Addendum)
PHARMACY - TOTAL PARENTERAL NUTRITION CONSULT NOTE  Indication: Chylothorax  Patient Measurements: Height: 5\' 11"  (180.3 cm) Weight: 95 kg (209 lb 7 oz) IBW/kg (Calculated) : 75.3 TPN AdjBW (KG): 81.4 Body mass index is 29.21 kg/m. Usual Weight: 118-122 kg prior to cancer dx, now 85.8 kg  Assessment:  54 YOM with esophageal cancer presented on 02/24/20 for esophagectomy and J-tube placement.  Patient was started on TF post-op and was briefly trialed on a clear liquid diet.  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 Patient underwent lymphangiogram and thoracic duct fenestration on 03/23/20 - unable to cannulate the thoracic duct. Pharmacy consulted to manage TPN to allow for healing as low-fat, MCT TF formula has not been successful.  Glucose / Insulin: no hx DM - CBGs <180.  Utilized 2 units SSI in last 24hrs  Electrolytes: K 3.4. Corrected Ca 9.5. Other electrolytes wnl. Renal: SCr < 1,  BUN WNL  LFTs / TGs: LFTs normalized. T bili 0.4. TG 44 on 12/6 Prealbumin / albumin: prealbumin 10.1, albumin 146.  Intake / Output; MIVF: UOP 0.4 ml/kg/hr (no foley), CT O/P 1800 ml/24 hr. LBM ~12/5 Imaging: 12/6 CT Chest pending Surgeries / Procedures: 12/3 BL lymphangiogram and thoracic duct embolization   Central access: PICC placed 03/24/20 TPN start date: 03/24/20  Nutritional Goals (per RD updated rec 11/30): 2800-3000 kCal, 190-210g protein, >/= 2.2L fluid per day  Current Nutrition:  Clear liquid diet: 0-10% documented TPN  Plan:  Will cycle TPN when diet advanced (pending CT Chest) Decreased lipid due to national shortage- TPN at goal rate of 1109ml/hr (providing 190g AA, 499g CHO and 43g SMOFlipids, for a total 2895 kCal, meeting 100% of patient needs) Electrolytes in TPN: increase to 20 mEq/L of K, 16 mEq/L of Mg; continue 11mEq/L of Na, 1.5 mEq/L of Ca, 11 mmol/L of Phos, ON:GEXB 2:1 Add standard MVI  and trace elements to TPN Increase mSSI to Q8H given increased dextrose in TPN  Standard TPN labs on Mon/Thurs, chest tube output, I/O's   Thank you for involving pharmacy in this patient's care.  Benetta Spar, PharmD, BCPS, BCCP Clinical Pharmacist  Please check AMION for all [place] phone numbers After 10:00 PM, call [place] [phone]

## 2020-04-10 NOTE — Progress Notes (Addendum)
Occupational Therapy Treatment Patient Details Name: Brett Dawson MRN: [id number] DOB: Dec 29, 1955 Today's Date: 04/10/2020    History of present illness 64 yo male with Esophageal cancer. s/p 02/24/20 robotic assisted Ivor-Lewis esophagectomy with jejunostomy tube placement. Pt with increasing shortness of breath and fever with chest tubes placed x 2 (11/1 and 11/2)   OT comments  Upon arrival, pt sitting at EOB with RN and completing bath. Pt performing UB bathing with Min A and LB bathing with Min A using lateral leaning for cleaning peri area. Pt requiring Min A for standing balance to perform sit<>stand and then take side steps towards [facility]; presenting with poor balance, strength, and activity tolerance. Pt continues to present with self limiting behavior and making statements about death and dying. Notified RN and possibly would benefit from chaplain. Continue to recommend dc to home with HHOT and will continue to follow acutely as admitted. Updated goals.    Follow Up Recommendations  Home health OT;Supervision/Assistance - 24 hour    Equipment Recommendations  3 in 1 bedside commode    Recommendations for Other Services      Precautions / Restrictions Precautions Precautions: Fall Precaution Comments: chest tube, gtube       Mobility Bed Mobility Overal bed mobility: Needs Assistance Bed Mobility: Sit to Supine       Sit to supine: Supervision   General bed mobility comments: Supervision for safety  Transfers Overall transfer level: Needs assistance Equipment used: None Transfers: Sit to/from Stand Sit to Stand: Min assist         General transfer comment: Min A for balance and safety. Sit<>stand to change bedpad. Side steps towards HOB with Min A for balance. Pt clearly weak and with decreased balance    Balance Overall balance assessment: Needs assistance Sitting-balance support: Feet supported;No upper extremity supported Sitting balance-Leahy Scale: Good      Standing balance support: Single extremity supported;During functional activity Standing balance-Leahy Scale: Poor Standing balance comment: Reliant on UE support                           ADL either performed or assessed with clinical judgement   ADL Overall ADL's : Needs assistance/impaired         Upper Body Bathing: Minimal assistance;Sitting Upper Body Bathing Details (indicate cue type and reason): Pt participatign in UB bathing with RN upon arrival. Min A for washing back Lower Body Bathing: Minimal assistance;Sit to/from stand Lower Body Bathing Details (indicate cue type and reason): Min A for feet. Pt able to perform peri care while seated Upper Body Dressing : Minimal assistance;Sitting Upper Body Dressing Details (indicate cue type and reason): Min A for donning new gown     Toilet Transfer: Minimal assistance Toilet Transfer Details (indicate cue type and reason): taking side steps towards [facility] with Min A         Functional mobility during ADLs: Minimal assistance (sit steps) General ADL Comments: Pt presenting with poor balance, strength, and acitvity tolerance. Self limiting behavior     Vision       Perception     Praxis      Cognition Arousal/Alertness: Awake/alert Behavior During Therapy: Flat affect Overall Cognitive Status: Within Functional Limits for tasks assessed                                 General Comments: Self limiting  and very frustrated with medical care. Pt with statements such as "I am goign to lay here and rot" or "well, let me die then because I think thats what they want".         Exercises     Shoulder Instructions       General Comments Spo2 dropping to 86% on RA with return to supine. After rest, pt elevating to 90s on RA    Pertinent Vitals/ Pain       Pain Assessment: Faces Faces Pain Scale: Hurts little more Pain Location: back pain Pain Descriptors / Indicators: Guarding Pain  Intervention(s): Monitored during session;Limited activity within patient's tolerance;Repositioned  Home Living                                          Prior Functioning/Environment              Frequency  Min 2X/week        Progress Toward Goals  OT Goals(current goals can now be found in the care plan section)  Progress towards OT goals: Progressing toward goals  Acute Rehab OT Goals Patient Stated Goal: I want to go outside. OT Goal Formulation: With patient Time For Goal Achievement: 04/10/20 Potential to Achieve Goals: Good ADL Goals Pt Will Transfer to Toilet: ambulating;regular height toilet;with min assist Pt Will Perform Tub/Shower Transfer: Tub transfer;ambulating;with min guard assist Pt/caregiver will Perform Home Exercise Program: Increased strength;With theraband;Independently (level 3 tband) Additional ADL Goal #1: Pt will complete 2-3 grooming ADL at sink level after setup assistance.   Plan Discharge plan remains appropriate    Co-evaluation                 AM-PAC OT "6 Clicks" Daily Activity     Outcome Measure   Help from another person eating meals?: None Help from another person taking care of personal grooming?: A Little Help from another person toileting, which includes using toliet, bedpan, or urinal?: A Lot Help from another person bathing (including washing, rinsing, drying)?: A Little Help from another person to put on and taking off regular upper body clothing?: A Little Help from another person to put on and taking off regular lower body clothing?: A Little 6 Click Score: 18    End of Session    OT Visit Diagnosis: Muscle weakness (generalized) (M62.81)   Activity Tolerance Patient limited by fatigue   Patient Left in bed;with call bell/phone within reach   Nurse Communication Other (comment) (O2 status)        Time: 3151-7616 OT Time Calculation (min): 12 min  Charges: OT General Charges $OT  Visit: 1 Visit OT Treatments $Self Care/Home Management : 8-22 mins  [place], OTR/L Acute Rehab Pager: [phone] Office: [place] 04/10/2020, 2:49 PM

## 2020-04-10 NOTE — Progress Notes (Signed)
Patient refusing to be nothing by mouth after midnight.Patient stated," I am not going to lay here all night without having something to drink and my surgery not scheduled until almost one o'clock tomorrow.I will stop drinking at six."

## 2020-04-10 NOTE — Progress Notes (Addendum)
      [address]       [place] [address]             [phone]      3 Days Post-Op Procedure(s) (LRB): IR WITH ANESTHESIA LYMPHANGIOGRAM BILATERAL WITH EMBOLIZATION (N/A)   Subjective:  Patient in better spirits.  No new complaints.  Tolerating liquid diet.  Objective: Vital signs in last 24 hours: Temp:  [98.4 F (36.9 C)-98.7 F (37.1 C)] 98.4 F (36.9 C) (12/06 0522) Pulse Rate:  [85-89] 89 (12/06 0522) Cardiac Rhythm: Normal sinus rhythm (12/06 0701) Resp:  [16-20] 18 (12/06 0522) BP: (100-113)/(56-68) 113/66 (12/06 0522) SpO2:  [90 %-93 %] 90 % (12/06 0522) Weight:  [95 kg] 95 kg (12/06 0548)  Intake/Output from previous day: 12/05 0701 - 12/06 0700 In: 4032.6 [P.O.:600; I.V.:3432.6] Out: 3300 [Urine:1500; Chest Tube:1800]  General appearance: alert, cooperative and no distress Heart: regular rate and rhythm Lungs: clear to auscultation bilaterally Abdomen: soft, non-tender; bowel sounds normal; no masses,  no organomegaly Wound: well healed, J Tube site stable  Lab Results: Recent Labs    04/10/20 0541  WBC 6.8  HGB 8.5*  HCT 27.3*  PLT 266   BMET:  Recent Labs    04/09/20 0405 04/10/20 0541  NA 135 135  K 3.7 3.4*  CL 101 102  CO2 26 27  GLUCOSE 131* 133*  BUN 22 19  CREATININE 0.39* 0.40*  CALCIUM 7.5* 7.5*    PT/INR: No results for input(s): LABPROT, INR in the last 72 hours. ABG    Component Value Date/Time   PHART 7.454 (H) 02/25/2020 0500   HCO3 26.5 02/25/2020 0500   TCO2 27 02/24/2020 1944   ACIDBASEDEF 1.0 02/24/2020 1547   O2SAT 93.7 02/25/2020 0500   CBG (last 3)  Recent Labs    04/09/20 1642 04/09/20 2054 04/10/20 0635  GLUCAP 123* 121* 129*    Assessment/Plan: S/P Procedure(s) (LRB): IR WITH ANESTHESIA LYMPHANGIOGRAM BILATERAL WITH EMBOLIZATION (N/A)  1. CV- hemodynamically stable in NSR 2. Chest tube- pleurovac changed this morning, already with 250 cc output, fluid remains turbid looking,  leave in place today... S/P lymphagiogram with thoracic duct embolization for chylothorax 3. Diet- will continue TPN, leave on clears for now, CT output remains high as discussed with Dr. Kipp Brood will get CT scan chest, can hopefully advance to soft diet soon if scan is okay 4. Dispo- patient stable, CT output remains too high to remove, leave in place today, on TPN, advance diet to soft diet, continue current care   LOS: 46 days    Ellwood Handler, PA-C 04/10/2020  Continues to have high CT output OR tomorrow for esophagogastroscopy, right and left robotic assisted thoracoscopy, thoracic duct ligation  Brett Dawson

## 2020-04-11 ENCOUNTER — Inpatient Hospital Stay (HOSPITAL_COMMUNITY): Payer: 59 | Admitting: Certified Registered"

## 2020-04-11 ENCOUNTER — Inpatient Hospital Stay (HOSPITAL_COMMUNITY): Payer: 59

## 2020-04-11 ENCOUNTER — Encounter (HOSPITAL_COMMUNITY)
Admission: RE | Disposition: A | Discharge status: Home Health | Payer: Self-pay | Source: Home / Self Care | Attending: Thoracic Surgery (Cardiothoracic Vascular Surgery)

## 2020-04-11 DIAGNOSIS — K9189 Other postprocedural complications and disorders of digestive system: Secondary | ICD-10-CM

## 2020-04-11 HISTORY — PX: VIDEO ASSISTED THORACOSCOPY (VATS)/EMPYEMA: SHX6172

## 2020-04-11 HISTORY — PX: ESOPHAGEAL STENT PLACEMENT: SHX5540

## 2020-04-11 LAB — GLUCOSE, CAPILLARY
Glucose-Capillary: 124 mg/dL — ABNORMAL HIGH (ref 70–99)
Glucose-Capillary: 92 mg/dL (ref 70–99)
Glucose-Capillary: 88 mg/dL (ref 70–99)
Glucose-Capillary: 82 mg/dL (ref 70–99)

## 2020-04-11 LAB — BASIC METABOLIC PANEL
Anion gap: 9 (ref 5–15)
BUN: 14 mg/dL (ref 8–23)
CO2: 25 mmol/L (ref 22–32)
Calcium: 7.5 mg/dL — ABNORMAL LOW (ref 8.9–10.3)
Chloride: 103 mmol/L (ref 98–111)
Creatinine, Ser: 0.34 mg/dL — ABNORMAL LOW (ref 0.61–1.24)
GFR, Estimated: >60 mL/min (ref >60)
Glucose, Bld: 121 mg/dL — ABNORMAL HIGH (ref 70–99)
Potassium: 3.4 mmol/L — ABNORMAL LOW (ref 3.5–5.1)
Sodium: 137 mmol/L (ref 135–145)

## 2020-04-11 LAB — PHOSPHORUS: Phosphorus: 3.4 mg/dL (ref 2.5–4.6)

## 2020-04-11 SURGERY — INSERTION, STENT, ESOPHAGUS
Anesthesia: General | Site: Chest

## 2020-04-11 MED ORDER — BUPIVACAINE LIPOSOME 1.3 % IJ SUSP
20.0000 mL | INTRAMUSCULAR | Status: DC
  Filled 2020-04-11: qty 20

## 2020-04-11 MED ORDER — MIDAZOLAM HCL 5 MG/5ML IJ SOLN
INTRAMUSCULAR | Status: DC | PRN
  Administered 2020-04-11: 2 mg via INTRAVENOUS

## 2020-04-11 MED ORDER — ONDANSETRON HCL 4 MG/2ML IJ SOLN
INTRAMUSCULAR | Status: DC | PRN
  Administered 2020-04-11: 4 mg via INTRAVENOUS

## 2020-04-11 MED ORDER — ONDANSETRON HCL 4 MG/2ML IJ SOLN: 4.0000 mg | Freq: Four times a day (QID) | INTRAMUSCULAR | Status: DC | PRN

## 2020-04-11 MED ORDER — TRAVASOL 10 % IV SOLN
INTRAVENOUS | Status: DC
  Filled 2020-04-11: qty 1903.2

## 2020-04-11 MED ORDER — SUCCINYLCHOLINE CHLORIDE 200 MG/10ML IV SOSY
PREFILLED_SYRINGE | INTRAVENOUS | Status: AC
  Filled 2020-04-11: qty 10

## 2020-04-11 MED ORDER — CHLORHEXIDINE GLUCONATE CLOTH 2 % EX PADS
6.0000 | MEDICATED_PAD | Freq: Every day | CUTANEOUS | Status: DC
  Administered 2020-04-11 – 2020-04-20 (×7): 6 via TOPICAL

## 2020-04-11 MED ORDER — PHENYLEPHRINE 40 MCG/ML (10ML) SYRINGE FOR IV PUSH (FOR BLOOD PRESSURE SUPPORT)
PREFILLED_SYRINGE | INTRAVENOUS | Status: DC | PRN
  Administered 2020-04-11 (×2): 80 ug via INTRAVENOUS
  Administered 2020-04-11: 120 ug via INTRAVENOUS
  Administered 2020-04-11 (×2): 80 ug via INTRAVENOUS

## 2020-04-11 MED ORDER — OSMOLITE 1.5 CAL PO LIQD
1000.0000 mL | ORAL | Status: DC
  Administered 2020-04-11 – 2020-04-18 (×10): 1000 mL
  Filled 2020-04-11 (×7): qty 1000

## 2020-04-11 MED ORDER — PROPOFOL 10 MG/ML IV BOLUS
INTRAVENOUS | Status: AC
  Filled 2020-04-11: qty 20

## 2020-04-11 MED ORDER — CEFAZOLIN SODIUM-DEXTROSE 2-3 GM-%(50ML) IV SOLR
INTRAVENOUS | Status: DC | PRN
  Administered 2020-04-11: 2 g via INTRAVENOUS

## 2020-04-11 MED ORDER — FENTANYL CITRATE (PF) 250 MCG/5ML IJ SOLN
INTRAMUSCULAR | Status: AC
  Filled 2020-04-11: qty 5

## 2020-04-11 MED ORDER — CHLORHEXIDINE GLUCONATE 0.12 % MT SOLN: 15.0000 mL | Freq: Once | OROMUCOSAL | Status: AC

## 2020-04-11 MED ORDER — SUGAMMADEX SODIUM 200 MG/2ML IV SOLN
INTRAVENOUS | Status: DC | PRN
  Administered 2020-04-11: 200 mg via INTRAVENOUS

## 2020-04-11 MED ORDER — FENTANYL CITRATE (PF) 250 MCG/5ML IJ SOLN
INTRAMUSCULAR | Status: DC | PRN
  Administered 2020-04-11 (×2): 100 ug via INTRAVENOUS
  Administered 2020-04-11: 50 ug via INTRAVENOUS

## 2020-04-11 MED ORDER — 0.9 % SODIUM CHLORIDE (POUR BTL) OPTIME
TOPICAL | Status: DC | PRN
  Administered 2020-04-11: 1000 mL

## 2020-04-11 MED ORDER — LIDOCAINE 2% (20 MG/ML) 5 ML SYRINGE
INTRAMUSCULAR | Status: DC | PRN
  Administered 2020-04-11: 40 mg via INTRAVENOUS

## 2020-04-11 MED ORDER — POTASSIUM CHLORIDE 10 MEQ/50ML IV SOLN
10.0000 meq | INTRAVENOUS | Status: DC
  Administered 2020-04-11 (×2): 10 meq via INTRAVENOUS
  Filled 2020-04-11 (×3): qty 50

## 2020-04-11 MED ORDER — LACTATED RINGERS IV SOLN: INTRAVENOUS | Status: DC | PRN

## 2020-04-11 MED ORDER — LIDOCAINE HCL (PF) 2 % IJ SOLN
INTRAMUSCULAR | Status: AC
  Filled 2020-04-11: qty 5

## 2020-04-11 MED ORDER — NEPRO/CARBSTEADY PO LIQD
1000.0000 mL | ORAL | Status: AC
  Administered 2020-04-11: 1000 mL
  Filled 2020-04-11 (×2): qty 1000

## 2020-04-11 MED ORDER — OXYCODONE HCL 5 MG PO TABS: 5.0000 mg | ORAL_TABLET | Freq: Once | ORAL | Status: DC | PRN

## 2020-04-11 MED ORDER — PROPOFOL 10 MG/ML IV BOLUS
INTRAVENOUS | Status: DC | PRN
  Administered 2020-04-11: 130 mg via INTRAVENOUS

## 2020-04-11 MED ORDER — CHLORHEXIDINE GLUCONATE 0.12 % MT SOLN
OROMUCOSAL | Status: AC
  Administered 2020-04-11: 15 mL via OROMUCOSAL
  Filled 2020-04-11: qty 15

## 2020-04-11 MED ORDER — SODIUM CHLORIDE FLUSH 0.9 % IV SOLN
INTRAVENOUS | Status: DC | PRN
  Administered 2020-04-11: 20 mL

## 2020-04-11 MED ORDER — CEFAZOLIN SODIUM 1 G IJ SOLR
INTRAMUSCULAR | Status: AC
  Filled 2020-04-11: qty 20

## 2020-04-11 MED ORDER — PROSOURCE TF PO LIQD
90.0000 mL | Freq: Three times a day (TID) | ORAL | Status: DC
  Administered 2020-04-11 – 2020-04-17 (×20): 90 mL
  Filled 2020-04-11 (×20): qty 90

## 2020-04-11 MED ORDER — SODIUM CHLORIDE 0.9 % IV SOLN
INTRAVENOUS | Status: DC | PRN
  Administered 2020-04-11: 30 mL

## 2020-04-11 MED ORDER — ALBUMIN HUMAN 5 % IV SOLN: INTRAVENOUS | Status: DC | PRN

## 2020-04-11 MED ORDER — OXYCODONE HCL 5 MG/5ML PO SOLN: 5.0000 mg | Freq: Once | ORAL | Status: DC | PRN

## 2020-04-11 MED ORDER — ROCURONIUM BROMIDE 10 MG/ML (PF) SYRINGE
PREFILLED_SYRINGE | INTRAVENOUS | Status: AC
  Filled 2020-04-11: qty 10

## 2020-04-11 MED ORDER — SUCCINYLCHOLINE CHLORIDE 200 MG/10ML IV SOSY
PREFILLED_SYRINGE | INTRAVENOUS | Status: DC | PRN
  Administered 2020-04-11: 120 mg via INTRAVENOUS

## 2020-04-11 MED ORDER — ROCURONIUM BROMIDE 10 MG/ML (PF) SYRINGE
PREFILLED_SYRINGE | INTRAVENOUS | Status: DC | PRN
  Administered 2020-04-11: 50 mg via INTRAVENOUS

## 2020-04-11 MED ORDER — MIDAZOLAM HCL 2 MG/2ML IJ SOLN
INTRAMUSCULAR | Status: AC
  Filled 2020-04-11: qty 2

## 2020-04-11 MED ORDER — BUPIVACAINE HCL (PF) 0.5 % IJ SOLN
INTRAMUSCULAR | Status: AC
  Filled 2020-04-11: qty 30

## 2020-04-11 MED ORDER — PHENYLEPHRINE HCL-NACL 10-0.9 MG/250ML-% IV SOLN
INTRAVENOUS | Status: DC | PRN
  Administered 2020-04-11: 50 ug/min via INTRAVENOUS

## 2020-04-11 MED ORDER — FENTANYL CITRATE (PF) 100 MCG/2ML IJ SOLN: 25.0000 ug | INTRAMUSCULAR | Status: DC | PRN

## 2020-04-11 SURGICAL SUPPLY — 121 items
ADH SKN CLS APL DERMABOND .7 (GAUZE/BANDAGES/DRESSINGS) ×3
APL PRP STRL LF DISP 70% ISPRP (MISCELLANEOUS) ×3
BLADE CLIPPER SURG (BLADE) ×4 IMPLANT
BNDG COHESIVE 6X5 TAN STRL LF (GAUZE/BANDAGES/DRESSINGS) ×4 IMPLANT
CANISTER SUCT 3000ML PPV (MISCELLANEOUS) ×12 IMPLANT
CANNULA REDUC XI 12-8 STAPL (CANNULA) ×8
CANNULA REDUCER 12-8 DVNC XI (CANNULA) ×6 IMPLANT
CATH THORACIC 28FR (CATHETERS) IMPLANT
CATH THORACIC 28FR RT ANG (CATHETERS) IMPLANT
CATH THORACIC 36FR (CATHETERS) IMPLANT
CATH THORACIC 36FR RT ANG (CATHETERS) IMPLANT
CATH TROCAR 20FR (CATHETERS) IMPLANT
CHLORAPREP W/TINT 26 (MISCELLANEOUS) ×4 IMPLANT
CLIP VESOCCLUDE MED 6/CT (CLIP) IMPLANT
CNTNR URN SCR LID CUP LEK RST (MISCELLANEOUS) ×9 IMPLANT
CONN ST 1/4X3/8  BEN (MISCELLANEOUS) ×4
CONN ST 1/4X3/8 BEN (MISCELLANEOUS) ×1 IMPLANT
CONT SPEC 4OZ STRL OR WHT (MISCELLANEOUS) ×12
COVER BACK TABLE 60X90IN (DRAPES) ×4 IMPLANT
COVER SURGICAL LIGHT HANDLE (MISCELLANEOUS) IMPLANT
DECANTER SPIKE VIAL GLASS SM (MISCELLANEOUS) ×2 IMPLANT
DEFOGGER SCOPE WARMER CLEARIFY (MISCELLANEOUS) ×4 IMPLANT
DERMABOND ADVANCED (GAUZE/BANDAGES/DRESSINGS) ×1
DERMABOND ADVANCED .7 DNX12 (GAUZE/BANDAGES/DRESSINGS) ×3 IMPLANT
DRAIN CHANNEL 28F RND 3/8 FF (WOUND CARE) ×2 IMPLANT
DRAIN CHANNEL 32F RND 10.7 FF (WOUND CARE) IMPLANT
DRAPE ARM DVNC X/XI (DISPOSABLE) ×12 IMPLANT
DRAPE COLUMN DVNC XI (DISPOSABLE) ×3 IMPLANT
DRAPE CV SPLIT W-CLR ANES SCRN (DRAPES) ×4 IMPLANT
DRAPE DA VINCI XI ARM (DISPOSABLE) ×16
DRAPE DA VINCI XI COLUMN (DISPOSABLE) ×4
DRAPE ORTHO SPLIT 77X108 STRL (DRAPES) ×4
DRAPE SURG ORHT 6 SPLT 77X108 (DRAPES) ×3 IMPLANT
DRAPE WARM FLUID 44X44 (DRAPES) ×4 IMPLANT
ELECT BLADE 6.5 EXT (BLADE) IMPLANT
ELECT REM PT RETURN 9FT ADLT (ELECTROSURGICAL) ×4
ELECTRODE REM PT RTRN 9FT ADLT (ELECTROSURGICAL) ×3 IMPLANT
FORCEPS GRASP COMBO 8X230 (FORCEP) ×3 IMPLANT
GAUZE KITTNER 4X5 RF (MISCELLANEOUS) ×4 IMPLANT
GAUZE SPONGE 4X4 12PLY STRL (GAUZE/BANDAGES/DRESSINGS) ×6 IMPLANT
GLOVE BIO SURGEON STRL SZ7 (GLOVE) ×4 IMPLANT
GLOVE BIO SURGEON STRL SZ7.5 (GLOVE) ×12 IMPLANT
GOWN STRL REUS W/ TWL LRG LVL3 (GOWN DISPOSABLE) ×6 IMPLANT
GOWN STRL REUS W/ TWL XL LVL3 (GOWN DISPOSABLE) ×12 IMPLANT
GOWN STRL REUS W/TWL 2XL LVL3 (GOWN DISPOSABLE) ×4 IMPLANT
GOWN STRL REUS W/TWL LRG LVL3 (GOWN DISPOSABLE) ×8
GOWN STRL REUS W/TWL XL LVL3 (GOWN DISPOSABLE) ×16
GUIDEWIRE JAGWIRE PULMNRY .035 (MISCELLANEOUS) ×2 IMPLANT
HEMOSTAT SURGICEL 2X14 (HEMOSTASIS) ×8 IMPLANT
IRRIGATION STRYKERFLOW (MISCELLANEOUS) ×2 IMPLANT
IRRIGATOR STRYKERFLOW (MISCELLANEOUS) ×4
JAGWIRE PULMONARY .035 (MISCELLANEOUS) ×4
KIT BASIN OR (CUSTOM PROCEDURE TRAY) ×4 IMPLANT
KIT SUCTION CATH 14FR (SUCTIONS) IMPLANT
KIT TURNOVER KIT B (KITS) ×8 IMPLANT
LOOP VESSEL SUPERMAXI WHITE (MISCELLANEOUS) IMPLANT
MARKER SKIN DUAL TIP RULER LAB (MISCELLANEOUS) ×4 IMPLANT
NDL HYPO 25GX1X1/2 BEV (NEEDLE) ×2 IMPLANT
NEEDLE 22X1 1/2 (OR ONLY) (NEEDLE) ×4 IMPLANT
NEEDLE HYPO 25GX1X1/2 BEV (NEEDLE) ×4 IMPLANT
NS IRRIG 1000ML POUR BTL (IV SOLUTION) ×16 IMPLANT
OBTURATOR OPTICAL STANDARD 8MM (TROCAR)
OBTURATOR OPTICAL STND 8 DVNC (TROCAR)
OBTURATOR OPTICALSTD 8 DVNC (TROCAR) IMPLANT
PACK CHEST (CUSTOM PROCEDURE TRAY) ×4 IMPLANT
PAD ARMBOARD 7.5X6 YLW CONV (MISCELLANEOUS) ×28 IMPLANT
PORT ACCESS TROCAR AIRSEAL 12 (TROCAR) ×3 IMPLANT
PORT ACCESS TROCAR AIRSEAL 5M (TROCAR) ×1
SEAL CANN UNIV 5-8 DVNC XI (MISCELLANEOUS) ×6 IMPLANT
SEAL XI 5MM-8MM UNIVERSAL (MISCELLANEOUS) ×8
SEALANT PROGEL (MISCELLANEOUS) IMPLANT
SEALANT SURG COSEAL 4ML (VASCULAR PRODUCTS) IMPLANT
SEALANT SURG COSEAL 8ML (VASCULAR PRODUCTS) IMPLANT
SET TRI-LUMEN FLTR TB AIRSEAL (TUBING) ×4 IMPLANT
SOLUTION ELECTROLUBE (MISCELLANEOUS) IMPLANT
SPONGE INTESTINAL PEANUT (DISPOSABLE) IMPLANT
STAPLER CANNULA SEAL DVNC XI (STAPLE) ×6 IMPLANT
STAPLER CANNULA SEAL XI (STAPLE) ×8
STENT WALLFLEX 23X15.5 (STENTS) ×2 IMPLANT
STOPCOCK 4 WAY LG BORE MALE ST (IV SETS) ×4 IMPLANT
SUT MON AB 2-0 CT1 36 (SUTURE) IMPLANT
SUT PDS AB 1 CTX 36 (SUTURE) IMPLANT
SUT PROLENE 4 0 RB 1 (SUTURE)
SUT PROLENE 4-0 RB1 .5 CRCL 36 (SUTURE) IMPLANT
SUT SILK  1 MH (SUTURE) ×8
SUT SILK 1 MH (SUTURE) ×4 IMPLANT
SUT SILK 1 TIES 10X30 (SUTURE) IMPLANT
SUT SILK 2 0 SH (SUTURE) IMPLANT
SUT SILK 2 0SH CR/8 30 (SUTURE) IMPLANT
SUT VIC AB 1 CTX 36 (SUTURE)
SUT VIC AB 1 CTX36XBRD ANBCTR (SUTURE) IMPLANT
SUT VIC AB 2-0 CT1 27 (SUTURE) ×4
SUT VIC AB 2-0 CT1 TAPERPNT 27 (SUTURE) ×3 IMPLANT
SUT VIC AB 3-0 SH 27 (SUTURE) ×8
SUT VIC AB 3-0 SH 27X BRD (SUTURE) ×8 IMPLANT
SUT VICRYL 0 TIES 12 18 (SUTURE) ×4 IMPLANT
SUT VICRYL 0 UR6 27IN ABS (SUTURE) ×8 IMPLANT
SUT VICRYL 2 TP 1 (SUTURE) IMPLANT
SYR 10ML LL (SYRINGE) ×4 IMPLANT
SYR 20ML ECCENTRIC (SYRINGE) ×4 IMPLANT
SYR 20ML LL LF (SYRINGE) ×4 IMPLANT
SYR 30ML SLIP (SYRINGE) ×4 IMPLANT
SYR 50ML LL SCALE MARK (SYRINGE) ×4 IMPLANT
SYSTEM SAHARA CHEST DRAIN ATS (WOUND CARE) ×4 IMPLANT
TAPE CLOTH 4X10 WHT NS (GAUZE/BANDAGES/DRESSINGS) ×4 IMPLANT
TAPE CLOTH SURG 4X10 WHT LF (GAUZE/BANDAGES/DRESSINGS) ×2 IMPLANT
TIP APPLICATOR SPRAY EXTEND 16 (VASCULAR PRODUCTS) IMPLANT
TOWEL GREEN STERILE (TOWEL DISPOSABLE) ×8 IMPLANT
TOWEL GREEN STERILE FF (TOWEL DISPOSABLE) ×4 IMPLANT
TRAY FOL W/BAG SLVR 16FR STRL (SET/KITS/TRAYS/PACK) ×3 IMPLANT
TRAY FOLEY MTR SLVR 16FR STAT (SET/KITS/TRAYS/PACK) ×4 IMPLANT
TRAY FOLEY W/BAG SLVR 16FR LF (SET/KITS/TRAYS/PACK) ×4
TROCAR BLADELESS 15MM (ENDOMECHANICALS) IMPLANT
TUBE CONNECTING 20X1/4 (TUBING) ×4 IMPLANT
TUBE ENDOVIVE SAFETY PEG 24 (TUBING) ×4 IMPLANT
TUBING ENDO SMARTCAP (MISCELLANEOUS) ×6 IMPLANT
TUBING EXTENTION W/L.L. (IV SETS) ×4 IMPLANT
UNDERPAD 30X36 HEAVY ABSORB (UNDERPADS AND DIAPERS) ×4 IMPLANT
WATER STERILE IRR 1000ML POUR (IV SOLUTION) ×8 IMPLANT
WIRE HYDRA 450CM (MISCELLANEOUS) IMPLANT
Wallflex Esophageal Fully Covered Stent ×2 IMPLANT

## 2020-04-11 NOTE — Anesthesia Preprocedure Evaluation (Signed)
Anesthesia Evaluation  Patient identified by MRN, date of birth, ID band Patient awake    Reviewed: Allergy & Precautions, H&P , NPO status , Patient's Chart, lab work & pertinent test results  Airway Mallampati: II   Neck ROM: full    Dental   Pulmonary neg pulmonary ROS,    breath sounds clear to auscultation       Cardiovascular negative cardio ROS   Rhythm:regular Rate:Normal     Neuro/Psych    GI/Hepatic Esophageal mass   Endo/Other    Renal/GU      Musculoskeletal   Abdominal   Peds  Hematology  (+) Blood dyscrasia, anemia ,   Anesthesia Other Findings   Reproductive/Obstetrics                             Anesthesia Physical Anesthesia Plan  ASA: III  Anesthesia Plan: General   Post-op Pain Management:    Induction: Intravenous  PONV Risk Score and Plan: 2 and Ondansetron, Dexamethasone, Midazolam and Treatment may vary due to age or medical condition  Airway Management Planned: Double Lumen EBT  Additional Equipment: Arterial line  Intra-op Plan:   Post-operative Plan: Extubation in OR  Informed Consent: I have reviewed the patients History and Physical, chart, labs and discussed the procedure including the risks, benefits and alternatives for the proposed anesthesia with the patient or authorized representative who has indicated his/her understanding and acceptance.       Plan Discussed with: CRNA, Anesthesiologist and Surgeon  Anesthesia Plan Comments:         Anesthesia Quick Evaluation

## 2020-04-11 NOTE — Anesthesia Procedure Notes (Signed)
Procedure Name: Intubation Date/Time: 04/11/2020 1:53 PM Performed by: Amadeo Garnet, CRNA Pre-anesthesia Checklist: Patient identified, Emergency Drugs available, Suction available and Patient being monitored Patient Re-evaluated:Patient Re-evaluated prior to induction Oxygen Delivery Method: Circle system utilized Preoxygenation: Pre-oxygenation with 100% oxygen Induction Type: IV induction Ventilation: Mask ventilation without difficulty Tube type: Oral Endobronchial tube: Double lumen EBT and Left and 39 Fr Number of attempts: 1 Airway Equipment and Method: Stylet and Oral airway Placement Confirmation: ETT inserted through vocal cords under direct vision,  positive ETCO2 and breath sounds checked- equal and bilateral Tube secured with: Tape Dental Injury: Teeth and Oropharynx as per pre-operative assessment

## 2020-04-11 NOTE — Progress Notes (Addendum)
      [address]       [place] [address]             [phone]      4 Days Post-Op Procedure(s) (LRB): IR WITH ANESTHESIA LYMPHANGIOGRAM BILATERAL WITH EMBOLIZATION (N/A)   Subjective:  No new complaints.  Patient refused NPO status overnight.  He states he isn't going to sit here all night with nothing especially knowing surgery will likely not be until the afternoon.  I explained the reason for this and this could now delay patient from having surgery today.  He again states he didn't walk  Objective: Vital signs in last 24 hours: Temp:  [98.3 F (36.8 C)-99.6 F (37.6 C)] 99.6 F (37.6 C) (12/07 0416) Pulse Rate:  [81-92] 86 (12/07 0416) Cardiac Rhythm: Normal sinus rhythm (12/07 0710) Resp:  [17-20] 18 (12/07 0416) BP: (102-116)/(58-64) 102/59 (12/07 0416) SpO2:  [90 %-92 %] 91 % (12/07 0416) Weight:  [94.9 kg] 94.9 kg (12/07 0416)  Intake/Output from previous day: 12/06 0701 - 12/07 0700 In: 2908.7 [P.O.:240; I.V.:2668.7] Out: 9147 [Urine:1575; Chest Tube:2000]  General appearance: alert, no distress  Heart: regular rate and rhythm Lungs: diminished breath sounds bibasilar Abdomen: soft, non-tender; bowel sounds normal; no masses,  no organomegaly Wound: stable to improving a J Tube site, incisions well healed  Lab Results: Recent Labs    04/10/20 0541  WBC 6.8  HGB 8.5*  HCT 27.3*  PLT 266   BMET:  Recent Labs    04/09/20 0405 04/10/20 0541  NA 135 135  K 3.7 3.4*  CL 101 102  CO2 26 27  GLUCOSE 131* 133*  BUN 22 19  CREATININE 0.39* 0.40*  CALCIUM 7.5* 7.5*    PT/INR: No results for input(s): LABPROT, INR in the last 72 hours. ABG    Component Value Date/Time   PHART 7.454 (H) 02/25/2020 0500   HCO3 26.5 02/25/2020 0500   TCO2 27 02/24/2020 1944   ACIDBASEDEF 1.0 02/24/2020 1547   O2SAT 93.7 02/25/2020 0500   CBG (last 3)  Recent Labs    04/10/20 1320 04/10/20 2155 04/11/20 0539  GLUCAP 124* 133* 124*     Assessment/Plan: S/P Procedure(s) (LRB): IR WITH ANESTHESIA LYMPHANGIOGRAM BILATERAL WITH EMBOLIZATION (N/A)  1. CV- hemodynamically stable in NSR 2. Chylothorax- patient continues to have high chest tube output 850 cc yellowish turbid fluid, CT chest yesterday is concerning for anastomotic leak... last esophagram was negative 3. Dispo- patient for OR today for B VATS with drainage of effusions, ligation of thoracic duct.... of note patient refused NPO status overnight, states the would start that at 6   LOS: 47 days    Ellwood Handler, PA-C 04/11/2020   NPO this am.  Has only been on clears Will restart high fat tubefeeds prior to Dent today for EGD, L thoracoscopy, possible ligation of thoracic duct, possible ligation.    On review of the CT the thoracic duct appears well embolized.  He may have an abberant left sided system.  He may also have an anastomotic leak despite having 2 negative swallow studies.  Vuong Musa [name]

## 2020-04-11 NOTE — Anesthesia Procedure Notes (Signed)
Arterial Line Insertion Start/End12/11/2019 12:34 PM, 04/11/2020 12:34 PM Performed by: Renato Shin, CRNA, CRNA  Patient location: Pre-op. Preanesthetic checklist: patient identified, IV checked, site marked, risks and benefits discussed, surgical consent, monitors and equipment checked, pre-op evaluation, timeout performed and anesthesia consent Lidocaine 1% used for infiltration Left, radial was placed Catheter size: 20 G Hand hygiene performed , maximum sterile barriers used  and Seldinger technique used Allen's test indicative of satisfactory collateral circulation Attempts: 1 Procedure performed without using ultrasound guided technique. Following insertion, dressing applied and Biopatch. Post procedure assessment: normal  Patient tolerated the procedure well with no immediate complications.

## 2020-04-11 NOTE — Op Note (Signed)
      [address]       [place] [address]             [phone]        04/11/2020  Patient:  Brett Dawson Pre-Op Dx: History of esophagectomy   History of chylothorax Post-op Dx: History of esophagectomy with anastomotic leak. Procedure: - Esophagogastroscopy -155 mm covered esophageal stent placement - esophagram with Omnipaque  - Left thoracoscopy 28 French Blake drain placement.  Surgeon and Role:      * Karder Goodin, Lucile Crater, MD - Primary Assistant: Evonnie Pat, PA-C  Anesthesia  general EBL: Minimal  Blood Administration: None Specimen: None   Counts: correct   Indications: Is a 64 year old male with a complicated medical history.  He underwent a minimally invasive Ivor Lewis esophagectomy 47 days ago.  His hospital course has been complicated by a chylothorax which was embolized 1 04/06/2020.  The consistency of his chest tube output change and is become more bilious.  He was taken to the operating theater for an esophagoscopy and evaluation for anastomotic leak.  Findings: Small perforation at the site of the anastomosis was observed.  This was a approximately 27 cm from the incisors.  Air was insufflated and the patient did develop an air leak which was concerning for an anastomotic leak.  I injected Omnipaque contrast through the EGD was observed flowing into the left chest.  We achieve good coverage of the esophageal stent with approximately 3 cm proximal to the anastomosis.    During thoracoscopy bilious output was noted in the left chest.  Operative Technique: After the risks, benefits and alternatives were thoroughly discussed, the patient was brought to the operative theatre.  Anesthesia was induced. The patient was prepped and draped in normal sterile fashion.  An appropriate surgical pause was performed, and pre-operative antibiotics were dosed accordingly.  The gastroscope was advanced through the oropharynx into the cervical esophagus under  direct visualization.  The scope was passed into the stomach.  The scope was then pulled back, and the esophageal mucosa was visualized.  A mucosal defect was noted at 27 cm from the incisors just above the anastomosis.  Contrast was injected through the scope.  There was extravasation outside of the conduit flowing into the left chest.  This area was marked on the skin with paper clips.  The scope was then advanced back through the gastric conduit and the duodenum was intubated.  A guidewire was then passed under direct visualization.  The esophageal stent was then placed over the wire, and positioned under fluoroscopy to cover the mucosal defect.   Next the patient was placed in right lateral decubitus position.  We made a 1 cm incision produced an 8 mm trocar.  The chest was then insufflated.  Another 8 mm trocar was placed posteriorly, and we we irrigated the chest.  There was bilious content noted in the posterior gutter.  This was completely cleaned out.  A 28 [place] Blake drain was then placed in the cavity.  The patient tolerated the procedure without any immediate complications, and was transferred to the PACU in stable condition.  Brett Dawson

## 2020-04-11 NOTE — Progress Notes (Signed)
Brief Nutrition Note  Consult received: "PATIENT NOW NPO AND WILL BE OFF TPA WITH ONLY TUBE FEEDS." Spoke with Jadene Pierini, [place]. Verbal with readback orders placed to d/c tonight's TPN bag. Per PA, place orders for tube feeds to meet pt's needs. Discussed plan with RN.  RD ordered: - Start Osmolite 1.5 @ 25 ml/hr and advance by 10 ml q 4 hours to goal rate of 75 ml/hr (1800 ml/day) - ProSource TF 90 ml TID  Tube feeding regimen at goal provides 2940 kcal, 179 grams of protein, and 1372 ml of H2O.   RD will continue to follow pt during admission. Full follow-up note to follow tomorrow.   Gustavus Bryant, MS, RD, LDN Inpatient Clinical Dietitian Please see AMiON for contact information.

## 2020-04-11 NOTE — Progress Notes (Signed)
Brief Nutrition Note  RD consulted by Dr. Kipp Brood regarding restarting regular tube feeds through pt's J-tube since pt is going to the OR this afternoon to assess for a persistent chyle leak. Spoke with Dr. Kipp Brood via phone call. Plan is to start Nepro tube feeding formula as it is the highest fat formula on the hospital formulary.  Discussed plan with RN. Will start Nepro @ 25 ml/hr and advance by 10 ml q 2 hours to goal rate of 65 ml/hr. Starting tube feeds at trickle rate and titrating to goal as pt has not had tube feeds via J-tube since 03/24/20.  Discussed with Pharmacy. Since plan is to start Nepro for purpose of surgery, will continue with TPN at goal rate to meet 100% of pt's needs. RD will place stop time for tube feeds of 1600 in case surgery is delayed as surgery is planned for 1300 today per RN.   Gustavus Bryant, MS, RD, LDN Inpatient Clinical Dietitian Please see AMiON for contact information.

## 2020-04-11 NOTE — Progress Notes (Addendum)
Report called to Short Stay RN. All questions answered. Pt transported to Short Stay unit via transporter and this RN. Vital signs stable upon arrival to unit.

## 2020-04-11 NOTE — Progress Notes (Signed)
PHARMACY - TOTAL PARENTERAL NUTRITION CONSULT NOTE  Indication: Chylothorax  Patient Measurements: Height: 5\' 11"  (180.3 cm) Weight: 94.9 kg (209 lb 3.5 oz) IBW/kg (Calculated) : 75.3 TPN AdjBW (KG): 81.4 Body mass index is 29.18 kg/m. Usual Weight: 118-122 kg prior to cancer dx, now 85.8 kg  Assessment:  5 YOM with esophageal cancer presented on 02/24/20 for esophagectomy and J-tube placement.  Patient was started on TF post-op and was briefly trialed on a clear liquid diet.  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 Patient underwent lymphangiogram and thoracic duct fenestration on 03/23/20 - unable to cannulate the thoracic duct. Pharmacy consulted to manage TPN to allow for healing as low-fat, MCT TF formula has not been successful.  Glucose / Insulin: no hx DM - CBGs <140.  Utilized 6 units SSI in last 24hrs  Electrolytes: K 3.4. Other electrolytes wnl.  Renal: SCr < 1,  BUN WNL  LFTs / TGs: LFTs normalized. T bili 0.4. TG 44 on 12/6 Prealbumin / albumin: prealbumin 10.1, albumin 146.  Intake / Output; MIVF: UOP 0.7 ml/kg/hr (no foley), CT O/P 2000 ml/24 hr. LBM ~12/5 Imaging: 12/6 CT Chest pending Surgeries / Procedures: 12/3 BL lymphangiogram and thoracic duct embolization   Central access: PICC placed 03/24/20 TPN start date: 03/24/20  Nutritional Goals (per RD updated rec 11/30): 2800-3000 kCal, 190-210g protein, >/= 2.2L fluid per day  Current Nutrition:  Clear liquid diet starting 12/6 - per RN good consumption with no issues, now NPO for surgery TPN  Plan:  OR for esophagogastroscopy today Continue TPN at goal rate of 130 mL/hr providing 190g AA, 499g CHO and 43g SMOFlipids, for a total 2895 kCal, meeting 100% of patient needs Hold off on cycling TPN given if OR intervention successful hopefully can eventually stop TPN Spoke with RD and per request of Dr. Kipp Brood she is starting TF for  OR to increase output to help in successful identification and intervention - per RD do not account for calories in TPN Electrolytes in TPN: increase to 25 mEq/L of K, continue 150 mEq/L of Na, 15 mEq/L of Mg, 1.5 mEq/L of Ca, 11 mmol/L of phos. Cl: Ac 2:1 KCl 10 mEq IV x3 Continue mSSI q8h Add standard MVI and trace elements to TPN Standard TPN labs on Mon/Thurs, chest tube output, I/O's   Thank you for involving pharmacy in this patient's care.  Cristela Felt, PharmD Clinical Pharmacist

## 2020-04-11 NOTE — Transfer of Care (Signed)
Immediate Anesthesia Transfer of Care Note  Patient: Brett Dawson  Procedure(s) Performed: ESOPHAGOGASTROSCOPY WITH ESOPHAGEAL STENT PLACEMENT (N/A ) LEFT VIDEO ASSISTED THORACOSCOPY (VATS) WITH IRRIGATION AND CHEST TUBE PLACEMENT (Left Chest)  Patient Location: PACU  Anesthesia Type:General  Level of Consciousness: awake, alert  and oriented  Airway & Oxygen Therapy: Patient Spontanous Breathing and Patient connected to nasal cannula oxygen  Post-op Assessment: Report given to RN, Post -op Vital signs reviewed and stable and Patient moving all extremities X 4  Post vital signs: Reviewed and stable  Last Vitals:  Vitals Value Taken Time  BP 104/58 04/11/20 1552  Temp    Pulse 80 04/11/20 1602  Resp    SpO2 98 % 04/11/20 1602  Vitals shown include unvalidated device data.  Last Pain:  Vitals:   04/11/20 0855  TempSrc: Oral  PainSc: 0-No pain      Patients Stated Pain Goal: 0 (41/63/84 5364)  Complications: No complications documented.

## 2020-04-11 NOTE — Brief Op Note (Signed)
02/24/2020 - 04/11/2020  3:30 PM  PATIENT:  Brett Dawson  64 y.o. male  PRE-OPERATIVE DIAGNOSIS:  Thoracic Duct Leak  POST-OPERATIVE DIAGNOSIS:  Esophageal Leak  PROCEDURE:  ESOPHAGOSCOPY WITH ESOPHAGEAL STENT PLACEMENT LEFT VATS FOR CHEST IRRIGATION AND CHEST TUBE PLACEMENT   SURGEON:  Surgeon(s) and Role:    * Lightfoot, Lucile Crater, MD - Primary  PHYSICIAN ASSISTANT: Sohan Potvin PA-C  ANESTHESIA:   general  EBL:  MINIMAL   BLOOD ADMINISTERED:none  DRAINS: 1 28 F BLAKE Chest Tube(s) in the LEFT HEMITHORAX   LOCAL MEDICATIONS USED:  OTHER EXPAREL  SPECIMEN:  No Specimen  DISPOSITION OF SPECIMEN:  N/A  COUNTS:  YES  TOURNIQUET:  * No tourniquets in log *  DICTATION: .Dragon Dictation  PLAN OF CARE: Admit to inpatient   PATIENT DISPOSITION:  PACU - hemodynamically stable.   Delay start of Pharmacological VTE agent (>24hrs) due to surgical blood loss or risk of bleeding: yes  COMPLICATIONS: NO KNOWN

## 2020-04-11 NOTE — Progress Notes (Signed)
Nutrition Follow-up  DOCUMENTATION CODES:   Severe malnutrition in context of acute illness/injury  INTERVENTION:   - Continue TPN per Pharmacy to meet 100% of pt's estimated needs  NUTRITION DIAGNOSIS:   Severe Malnutrition related to acute illness (chylothorax) as evidenced by severe fat depletion, severe muscle depletion, percent weight loss (14.7% weight loss in less than 1 month).  Ongoing  GOAL:   Patient will meet greater than or equal to 90% of their needs  Met via TPN  MONITOR:   PO intake, Supplement acceptance, Labs, Weight trends, Skin, I & O's  REASON FOR ASSESSMENT:   Consult Enteral/tube feeding initiation and management  ASSESSMENT:   64 year old male who presented on 10/21 for Ivor Lewis esophagectomy and J-tube placement. 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  10/22 - TF initiated 10/26 - swallow study, no evidence of leak, NGT removed 10/27 - MBS, diet advanced to clears, transitioned to nocturnal TF 10/31 - s/p thoracentesis with 1 L fluid removed 11/01 - chest tube placed, NPO 11/03 - left chest tube placed by IR 11/04 - clear liquids 11/05 - JP drain removed 11/18 - lymphangiogram and unsuccessful thoracic duct embolization 11/19 - PICC placed, TPN initiated10/22 - TF initiated 10/26 - swallow study, no evidence of leak, NGT removed 10/27 - MBS, diet advanced to clears, transitioned to nocturnal TF 10/31 - s/p thoracentesis with 1 L fluid removed 11/01 - chest tube placed, NPO 11/03 - left chest tube placed by IR 11/04 - clear liquids 11/05 - JP drain removed 11/18 - lymphangiogram and unsuccessful thoracic duct embolization 11/19 - PICC placed, TPN initiated 12/03 - bilateral lymphangiogram and successful thoracic duct embolization  Per CTS note, pt continues to have high chest tube output. CT of chest yesterday was  concerning for anastomotic leak. Plan is for pt to go to the OR today for EGD, left thoracoscopy, and possible ligation of thoracic duct.  High-fat tube feeds to start this AM per MD in anticipation of OR visit this afternoon. Plan is to continue TPN at goal rate of 130 ml/hr to provide 2895 kcal and 190 grams of protein.  Discussed pt and plan with RN and Pharmacy.  Admit weight: 99.6 kg Current weight: 94.9 kg  Weight trending up over the last 2 weeks. No evidence of edema at this time.  Medications reviewed and include: SSI q 8 hours, protonix, IV KCl 10 mEq x 3 runs, TPN  Labs reviewed: potassium 3.4 CBG's: 121-133 x 24 hours  UOP: 1575 ml x 24 hours CT: 2000 ml x 24 hours I/O's: -6.7 L since admit  Diet Order:   Diet Order            Diet clear liquid Room service appropriate? Yes; Fluid consistency: Thin  Diet effective now                 EDUCATION NEEDS:   Education needs have been addressed  Skin:  Skin Assessment: Skin Integrity Issues: Incisions: chest, abdomen, groin  Last BM:  04/09/20  Height:   Ht Readings from Last 1 Encounters:  02/24/20 '5\' 11"'  (1.803 m)    Weight:   Wt Readings from Last 1 Encounters:  04/11/20 94.9 kg    Ideal Body Weight:  78.2 kg  BMI:  Body mass index is 29.18 kg/m.  Estimated Nutritional Needs:   Kcal:  2800-3000  Protein:  413-244  grams  Fluid:  >/= 2.2 L    Gustavus Bryant, MS, RD, LDN Inpatient Clinical Dietitian Please see AMiON for contact information.

## 2020-04-12 ENCOUNTER — Inpatient Hospital Stay (HOSPITAL_COMMUNITY): Payer: 59

## 2020-04-12 ENCOUNTER — Encounter (HOSPITAL_COMMUNITY): Payer: Self-pay | Admitting: Thoracic Surgery (Cardiothoracic Vascular Surgery)

## 2020-04-12 LAB — GLUCOSE, CAPILLARY
Glucose-Capillary: 99 mg/dL (ref 70–99)
Glucose-Capillary: 107 mg/dL — ABNORMAL HIGH (ref 70–99)
Glucose-Capillary: 117 mg/dL — ABNORMAL HIGH (ref 70–99)
Glucose-Capillary: 114 mg/dL — ABNORMAL HIGH (ref 70–99)
Glucose-Capillary: 92 mg/dL (ref 70–99)

## 2020-04-12 LAB — BASIC METABOLIC PANEL
Anion gap: 8 (ref 5–15)
BUN: 13 mg/dL (ref 8–23)
CO2: 25 mmol/L (ref 22–32)
Calcium: 7.8 mg/dL — ABNORMAL LOW (ref 8.9–10.3)
Chloride: 102 mmol/L (ref 98–111)
Creatinine, Ser: 0.4 mg/dL — ABNORMAL LOW (ref 0.61–1.24)
GFR, Estimated: >60 mL/min (ref >60)
Glucose, Bld: 110 mg/dL — ABNORMAL HIGH (ref 70–99)
Potassium: 3.6 mmol/L (ref 3.5–5.1)
Sodium: 135 mmol/L (ref 135–145)

## 2020-04-12 LAB — PHOSPHORUS: Phosphorus: 4.1 mg/dL (ref 2.5–4.6)

## 2020-04-12 LAB — MAGNESIUM: Magnesium: 1.6 mg/dL — ABNORMAL LOW (ref 1.7–2.4)

## 2020-04-12 MED ORDER — FREE WATER
75.0000 mL | Status: DC
  Administered 2020-04-12 – 2020-04-21 (×50): 75 mL

## 2020-04-12 MED ORDER — INSULIN ASPART 100 UNIT/ML ~~LOC~~ SOLN
0.0000 [IU] | Freq: Three times a day (TID) | SUBCUTANEOUS | Status: DC
  Administered 2020-04-15 – 2020-04-21 (×5): 2 [IU] via SUBCUTANEOUS

## 2020-04-12 NOTE — Progress Notes (Addendum)
      [address]       [place] [address]             [phone]      1 Day Post-Op Procedure(s) (LRB): ESOPHAGOGASTROSCOPY WITH ESOPHAGEAL STENT PLACEMENT (N/A) LEFT VIDEO ASSISTED THORACOSCOPY (VATS) WITH IRRIGATION AND CHEST TUBE PLACEMENT (Left)   Subjective:  Patient is frustrated and thirsty.  He states that he hasn't been allowed to have anything to drink since surgery.  He also asks why they are checking his blood sugars and waking him up overnight.  Objective: Vital signs in last 24 hours: Temp:  [97.6 F (36.4 C)-98.9 F (37.2 C)] 98.8 F (37.1 C) (12/08 0410) Pulse Rate:  [71-87] 82 (12/08 0410) Cardiac Rhythm: Normal sinus rhythm (12/08 0703) Resp:  [11-21] 20 (12/08 0410) BP: (93-123)/(53-75) 93/57 (12/08 0410) SpO2:  [91 %-97 %] 92 % (12/08 0410) Arterial Line BP: (92-113)/(35-37) 112/36 (12/07 1615) Weight:  [96.2 kg] 96.2 kg (12/08 0413)  Intake/Output from previous day: 12/07 0701 - 12/08 0700 In: 2497 [I.V.:1600; NG/GT:257; IV Piggyback:510] Out: 8832 [Urine:1200; Chest Tube:587]  General appearance: alert and no distress Heart: regular rate and rhythm Lungs: clear to auscultation bilaterally Abdomen: soft, non-tender; bowel sounds normal; no masses,  no organomegaly Extremities: extremities normal, atraumatic, no cyanosis or edema Wound: clean and dry  Lab Results: Recent Labs    04/10/20 0541  WBC 6.8  HGB 8.5*  HCT 27.3*  PLT 266   BMET:  Recent Labs    04/11/20 0755 04/12/20 0615  NA 137 135  K 3.4* 3.6  CL 103 102  CO2 25 25  GLUCOSE 121* 110*  BUN 14 13  CREATININE 0.34* 0.40*  CALCIUM 7.5* 7.8*    PT/INR: No results for input(s): LABPROT, INR in the last 72 hours. ABG    Component Value Date/Time   PHART 7.454 (H) 02/25/2020 0500   HCO3 26.5 02/25/2020 0500   TCO2 27 02/24/2020 1944   ACIDBASEDEF 1.0 02/24/2020 1547   O2SAT 93.7 02/25/2020 0500   CBG (last 3)  Recent Labs    04/11/20 2111  04/12/20 0410 04/12/20 0735  GLUCAP 82 99 107*    Assessment/Plan: S/P Procedure(s) (LRB): ESOPHAGOGASTROSCOPY WITH ESOPHAGEAL STENT PLACEMENT (N/A) LEFT VIDEO ASSISTED THORACOSCOPY (VATS) WITH IRRIGATION AND CHEST TUBE PLACEMENT (Left)  1. CV- hemodynamically stable, Hypotensive this morning, monitor 2. S/P VATS, placement of esophageal stent-- NPO for now, will likely need to assess stent prior to starting liquids back, CT output is low 3. GI- on tube feeds only, no TPN, NPO for now 4. Dispo- patient stable, will change blood sugars checks to TID, continue tube feeds, remains NPO for now, I suspect once esophageal stent placement is felt to be stable he can likely have clears   LOS: 48 days    Ellwood Handler, PA-C 04/12/2020  Chest tube output has cleared up and is mostly just serosanguineous. I will continue n.p.o. for now Will plan for discharge with tube feeds mini express chest drain  Nikkolas Coomes [place] Braddy

## 2020-04-12 NOTE — Progress Notes (Signed)
Chest tube is present despite documentation that it hs been removed. Output 33ml

## 2020-04-12 NOTE — Progress Notes (Signed)
OT Cancellation Note  Patient Details Name: Brett Dawson MRN: [id number] DOB: 02-03-1956   Cancelled Treatment:    Reason Eval/Treat Not Completed: Patient declined, expressed frustration with his situation, but was not willing to try anything to improve his functional independence.  Continue efforts if he is willing to participate.    Klayton Monie D Conn Trombetta 04/12/2020, 3:53 PM

## 2020-04-12 NOTE — Progress Notes (Addendum)
Nutrition Follow-up  RD working remotely.  DOCUMENTATION CODES:   Severe malnutrition in context of acute illness/injury  INTERVENTION:   Continue tube feeds via J-tube: - Advance Osmolite 1.5 by 10 ml q 4 hours to goal rate of 75 ml/hr (1800 ml/day) - ProSource TF 90 ml TID - Free water flushes of 75 ml q 4 hours  Tube feeding regimen at goal rate provides 2940 kcal, 179 grams of protein, and 1372 ml of H2O.   Total free water with flushes: 1822 ml  NUTRITION DIAGNOSIS:   Severe Malnutrition related to acute illness (chylothorax) as evidenced by severe fat depletion, severe muscle depletion, percent weight loss (14.7% weight loss in less than 1 month).  Ongoing  GOAL:   Patient will meet greater than or equal to 90% of their needs  Met via TF at goal  MONITOR:   Diet advancement, Labs, Weight trends, TF tolerance, Skin, I & O's  REASON FOR ASSESSMENT:   Consult Enteral/tube feeding initiation and management  ASSESSMENT:   64 year old male who presented on 10/21 for Ivor Lewis esophagectomy and J-tube placement. 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  10/22 - TF initiated 10/26 - swallow study, no evidence of leak, NGT removed 10/27 - MBS, diet advanced to clears, transitioned to nocturnal TF 10/31 - s/p thoracentesis with 1 L fluid removed 11/01 - chest tube placed, NPO 11/03 - left chest tube placed by IR 11/04 - clear liquids 11/05 - JP drain removed 11/18 - lymphangiogram and unsuccessful thoracic duct embolization 11/19 - PICC placed, TPN initiated10/22 - TF initiated 10/26 - swallow study, no evidence of leak, NGT removed 10/27 - MBS, diet advanced to clears, transitioned to nocturnal TF 10/31 - s/p thoracentesis with 1 L fluid removed 11/01 - chest tube placed, NPO 11/03 - left chest tube placed by IR 11/04 - clear liquids 11/05 -  JP drain removed 11/18 - lymphangiogram and unsuccessful thoracic duct embolization 11/19 - PICC placed, TPN initiated 12/03 - bilateral lymphangiogram and successful thoracic duct embolization 12/07 - esophagoscopy with esophageal stent placement, left VATS for chest irrigation and chest tube placement  RD consulted to restart tube feeds via J-tube. TPN d/c yesterday and Osmolite 1.5 formula started yesterday at 25 ml/hr with titration orders in place. Per discussion with RN, pt tolerating tube feeds at this time.  Pt is NPO at this time. Per CTS note, "once esophageal stent placement is felt to be stable he can likely have clears."  Admit weight: 99.6 kg Current weight: 96.2 kg  Medications reviewed and include: SSI, protonix  Labs reviewed: magnesium 1.6, hemoglobin 8.5 CBG's: 82-107 x 24 hours  UOP: 1200 ml x 24 hours CT: 587 ml x 24 hours I/O's: -5.5 L since admit  Diet Order:   Diet Order            Diet NPO time specified  Diet effective now                 EDUCATION NEEDS:   Education needs have been addressed  Skin:  Skin Assessment: Skin Integrity Issues: Incisions: chest x 2, abdomen x 3  Last BM:  04/09/20  Height:   Ht Readings from Last 1 Encounters:  02/24/20 _0  (1.803 m)    Weight:   Wt Readings from Last 1 Encounters:  04/12/20 96.2 kg    Ideal Body Weight:  78.2  kg  BMI:  Body mass index is 29.58 kg/m.  Estimated Nutritional Needs:   Kcal:  5183-3582  Protein:  170-190 grams  Fluid:  >/= 2.2 L    Gustavus Bryant, MS, RD, LDN Inpatient Clinical Dietitian Please see AMiON for contact information.

## 2020-04-12 NOTE — Progress Notes (Signed)
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 He underwent a first attempt at lymphangiography and thoracic duct embolization on 03/23/2020. Lymphangiography and fenestration of the thoracic duct were successful. However, the thoracic duct was not able to be accessed and embolized at that time.  OP is lower few days Not chylous in color Now serous color both drains No air leak noted either chest tube  Now post op day 1--- ESOPHAGOGASTROSCOPY WITH ESOPHAGEAL STENT PLACEMENT (N/A) LEFT VIDEO ASSISTED THORACOSCOPY (VATS) WITH IRRIGATION AND CHEST TUBE PLACEMENT (Left)   Today CXR: IMPRESSION: Left-sided chest tube is noted without pneumothorax. Stable bibasilar atelectasis with associated pleural effusions. Esophageal stent appears to be in grossly good position.  Allergies: Bee venom and Strawberry extract  Medications: Prior to Admission medications   Medication Sig Start Date End Date Taking? Authorizing Provider  Colchicine 0.6 MG CAPS Take 0.6 mg by mouth 2 (two) times daily as needed (gout).  11/02/19  Yes [provider]  FEROSUL 325 (65 Fe) MG tablet Take 1 tablet (325 mg total) by mouth daily. Patient not taking: Reported on 02/21/2020 12/13/19   [name], MD  ondansetron (ZOFRAN) 8 MG tablet Take 1 tablet (8 mg total) by mouth 2 (two) times daily as needed for refractory nausea / vomiting. Start on day 3 after chemo. 11/27/19   [name], MD  prochlorperazine (COMPAZINE) 10 MG tablet TAKE 1 TABLET(10 MG) BY MOUTH EVERY 6 HOURS AS NEEDED FOR NAUSEA OR VOMITING Patient taking differently: Take 10 mg by mouth every 6 (six) hours as needed for nausea or vomiting.  12/13/19   [name], MD  sucralfate  (CARAFATE) 1 g tablet Take 1 tablet (1 g total) by mouth 4 (four) times daily. Dissolve each tablet in 15 cc water before use. Patient taking differently: Take 1 g by mouth 4 (four) times daily as needed (throat pain). Dissolve each tablet in 15 cc water before use. 12/24/19   Kyung Rudd, MD     Vital Signs: BP 113/63 (BP Location: Left Arm)   Pulse 82   Temp 98.8 F (37.1 C) (Oral)   Resp 20   Ht 5\' 11"  (1.803 m)   Wt 212 lb 1.3 oz (96.2 kg)   SpO2 92%   BMI 29.58 kg/m   Physical Exam Vitals reviewed.  Pulmonary:     Effort: Pulmonary effort is normal.     Breath sounds: Normal breath sounds.  Skin:    General: Skin is warm and dry.     Comments: Sites of chest tubes are clean and dry NT no bleeding OP in chest tubes now serous color-- no longer chylous color No air leak in either CT  200 cc in CT #2 Minimal OP in CT #1     Imaging: DG Chest 1 View  Result Date: 04/11/2020 CLINICAL DATA:  64 year old male undergoing esophageal stent placement. Status post esophagectomy with gastric pull-through. Status post thoracic duct embolization. EXAM: CHEST  1 VIEW; DG C-ARM 1-60 MIN COMPARISON:  Chest CT 04/10/2020 and earlier. FINDINGS: Single fluoroscopic spot image demonstrating most of a metallic esophageal stent in the upper chest with superimposed sequelae of thoracic duct embolization, including both coils as well as probable persistent lymphatic contrast. There is dilute contrast within the  stent. IMPRESSION: 1. Single intraoperative fluoroscopic image of a metal esophageal stent containing dilute contrast. 2. Nearby embolization coils and probable persistent lymphatic contrast in the mediastinum from 04/07/2020 thoracic duct embolization. Electronically Signed   By: Genevie Ann M.D.   On: 04/11/2020 17:03   CT CHEST W CONTRAST  Result Date: 04/10/2020 CLINICAL DATA:  Pneumonia, effusion or abscess suspected, status post esophagectomy, chylothorax, status post thoracic duct  embolization, high chest tube output EXAM: CT CHEST WITH CONTRAST TECHNIQUE: Multidetector CT imaging of the chest was performed during intravenous contrast administration. CONTRAST:  16mL OMNIPAQUE IOHEXOL 300 MG/ML  SOLN COMPARISON:  03/07/2020 FINDINGS: Cardiovascular: Right upper extremity PICC. Normal heart size. Scattered coronary artery calcifications. No pericardial effusion. Mediastinum/Nodes: No enlarged mediastinal, hilar, or axillary lymph nodes. Coil material in the expected vicinity of the thoracic duct. Status post pull-through esophagectomy. Thyroid gland and trachea demonstrate no significant findings. Lungs/Pleura: There has been interval removal of an anteriorly positioned percutaneous pigtail drainage catheter and placement of a posteriorly positioned pigtail drainage catheter in the posterior left hemithorax. This catheter lies within a frothy collection in the left pleural space, which is somewhat decreased in size compared to prior examination, although contains a substantial volume of fluid and debris and appears to communicate with the adjacent mediastinum about the gastric pull-through (series 3, image 90). There is dense consolidation of the dependent left lower lobe as well as of the right lung base. A previously seen right-sided chest tube has been removed. Upper Abdomen: Small volume perihepatic and perisplenic ascites. Low-attenuation lesion of the anterior spleen, not significantly changed compared to prior examination an incompletely characterized (series 3, image 123). Musculoskeletal: No chest wall mass or suspicious bone lesions identified. IMPRESSION: 1. Status post gastric pull-through esophagectomy and thoracic duct coiling. 2. There has been interval removal of an anteriorly positioned percutaneous pigtail drainage catheter and placement of a posteriorly positioned pigtail drainage catheter in the posterior left hemithorax. This catheter lies within a frothy collection in the  left pleural space, which is somewhat decreased in size compared to prior examination, although contains a substantial volume of fluid and debris and appears to communicate with the adjacent mediastinum about the gastric pull-through. This appearance is generally suspicious for anastomotic leak. Thoracic duct leak is not likely to explain this volume fluid. 3. There is dense consolidation of the dependent left lower lobe as well as of the right lung base, which may reflect atelectasis and/or airspace disease. 4. A previously seen right-sided chest tube has been removed with a small persistent right pleural effusion, not significantly changed. 5. Small volume perihepatic and perisplenic ascites. 6. Low-attenuation lesion of the anterior spleen, not significantly changed compared to prior examination and incompletely characterized. Attention on follow-up. Electronically Signed   By: Eddie Candle M.D.   On: 04/10/2020 13:39   DG Chest Port 1 View  Result Date: 04/12/2020 CLINICAL DATA:  Esophageal stent position. EXAM: PORTABLE CHEST 1 VIEW COMPARISON:  April 11, 2020. FINDINGS: Stable cardiomediastinal silhouette. Left-sided chest tube is noted without pneumothorax. Subcutaneous emphysema is seen in left supraclavicular region. Right-sided PICC line is unchanged. Esophageal stent appears to be in grossly good position. Stable bibasilar atelectasis is noted with associated pleural effusions. Bony thorax is unremarkable IMPRESSION: Left-sided chest tube is noted without pneumothorax. Stable bibasilar atelectasis with associated pleural effusions. Esophageal stent appears to be in grossly good position. Electronically Signed   By: Marijo Conception M.D.   On: 04/12/2020 08:50   DG CHEST  PORT 1 VIEW  Result Date: 04/10/2020 CLINICAL DATA:  Chest tube present, pleural effusion. EXAM: PORTABLE CHEST 1 VIEW COMPARISON:  04/09/2020 chest radiograph and prior. FINDINGS: No pneumothorax. Patchy basilar predominant  opacities and small bilateral effusions are unchanged. Indwelling left basilar pleural pigtail catheter. Cardiomediastinal silhouette is unchanged. Right upper extremity PICC tip overlies the distal SVC. IMPRESSION: Indwelling left chest pleural pigtail catheter. Small bilateral effusions and basilar opacities, unchanged. Electronically Signed   By: Primitivo Gauze M.D.   On: 04/10/2020 08:12   DG CHEST PORT 1 VIEW  Result Date: 04/09/2020 CLINICAL DATA:  Pleural effusion EXAM: PORTABLE CHEST 1 VIEW COMPARISON:  Chest x-rays dated 04/08/2020 and 03/24/2020 FINDINGS: LEFT-sided chest tube is stable in position. Stable bibasilar opacities, likely a combination of atelectasis and small to moderate pleural effusions. No pneumothorax is seen. Heart size and mediastinal contours are stable. IMPRESSION: 1. Stable bibasilar opacities, likely a combination of atelectasis and small-to-moderate pleural effusions. 2. LEFT-sided chest tube is stable in position. Electronically Signed   By: Franki Cabot M.D.   On: 04/09/2020 08:51   DG C-Arm 1-60 Min  Result Date: 04/11/2020 CLINICAL DATA:  64 year old male undergoing esophageal stent placement. Status post esophagectomy with gastric pull-through. Status post thoracic duct embolization. EXAM: CHEST  1 VIEW; DG C-ARM 1-60 MIN COMPARISON:  Chest CT 04/10/2020 and earlier. FINDINGS: Single fluoroscopic spot image demonstrating most of a metallic esophageal stent in the upper chest with superimposed sequelae of thoracic duct embolization, including both coils as well as probable persistent lymphatic contrast. There is dilute contrast within the stent. IMPRESSION: 1. Single intraoperative fluoroscopic image of a metal esophageal stent containing dilute contrast. 2. Nearby embolization coils and probable persistent lymphatic contrast in the mediastinum from 04/07/2020 thoracic duct embolization. Electronically Signed   By: Genevie Ann M.D.   On: 04/11/2020 17:03     Labs:  CBC: Recent Labs    04/03/20 0530 04/05/20 0500 04/07/20 0046 04/10/20 0541  WBC 9.9 6.1 6.0 6.8  HGB 8.9* 8.4* 8.7* 8.5*  HCT 27.7* 26.4* 26.2* 27.3*  PLT 224 197 221 266    COAGS: Recent Labs    02/22/20 0830 03/08/20 0015 03/23/20 0446 04/07/20 0046  INR 1.0 1.1 1.2 1.3*  APTT 30  --   --   --     BMP: Recent Labs    12/27/19 0810 12/27/19 0810 01/03/20 0813 01/03/20 0813 01/11/20 3419 01/11/20 0814 01/26/20 1426 02/22/20 0830 04/09/20 0405 04/10/20 0541 04/11/20 0755 04/12/20 0615  NA 136   < > 140   < > 139   < > 140   < > 135 135 137 135  K 4.0   < > 3.9   < > 4.2   < > 4.2   < > 3.7 3.4* 3.4* 3.6  CL 107   < > 106   < > 105   < > 106   < > 101 102 103 102  CO2 23   < > 27   < > 29   < > 29   < > 26 27 25 25   GLUCOSE 117*   < > 127*   < > 105*   < > 99   < > 131* 133* 121* 110*  BUN 14   < > 12   < > 11   < > 10   < > 22 19 14 13   CALCIUM 9.4   < > 9.4   < > 9.0   < >  8.8*   < > 7.5* 7.5* 7.5* 7.8*  CREATININE 0.66   < > 0.63   < > 0.68   < > 0.71   < > 0.39* 0.40* 0.34* 0.40*  GFRNONAA >60   < > >60   < > >60   < > >60   < > >60 >60 >60 >60  GFRAA >60  --  >60  --  >60  --  >60  --   --   --   --   --    < > = values in this interval not displayed.    LIVER FUNCTION TESTS: Recent Labs    04/01/20 0340 04/03/20 0530 04/06/20 0415 04/10/20 0541  BILITOT 0.6 0.5 0.4 0.3  AST 18 15 14* 12*  ALT 28 19 14 12   ALKPHOS 113 88 76 66  PROT 5.5* 5.1* 4.9* 4.5*  ALBUMIN 1.8* 1.7* 1.6* 1.4*    Assessment and Plan:  Thoracic duct embolization in IR 12/3 OP slowing -- now serous instead of chylous color Followed by [facility]- Dr Kipp Brood  Electronically Signed: Lavonia Drafts, PA-C 04/12/2020, 11:26 AM   I spent a total of 15 Minutes at the the patient's bedside AND on the patient's hospital floor or unit, greater than 50% of which was counseling/coordinating care for left chest tube

## 2020-04-13 LAB — GLUCOSE, CAPILLARY
Glucose-Capillary: 130 mg/dL — ABNORMAL HIGH (ref 70–99)
Glucose-Capillary: 119 mg/dL — ABNORMAL HIGH (ref 70–99)
Glucose-Capillary: 89 mg/dL (ref 70–99)
Glucose-Capillary: 113 mg/dL — ABNORMAL HIGH (ref 70–99)

## 2020-04-13 NOTE — TOC Progression Note (Signed)
Transition of Care [facility]) - Progression Note    Patient Details  Name: TALTON DELPRIORE MRN: [id number] Date of Birth: [birth year]  Transition of Care [facility]) CM/SW Contact  Zenon Mayo, RN Phone Number: 04/13/2020, 2:37 PM  Clinical Narrative:    Patient is set up with [facility] for [facility], NCM was informed this am by PA that patient will go home with mini chest tube express when he goes and tube feeding.  NCM informed Ramond Marrow of this information to keep her updated.        Expected Discharge Plan and Services                                                 Social Determinants of Health (SDOH) Interventions    Readmission Risk Interventions No flowsheet data found.

## 2020-04-13 NOTE — Plan of Care (Signed)

## 2020-04-13 NOTE — Progress Notes (Signed)
Occupational Therapy Treatment Patient Details Name: Brett Dawson MRN: [id number] DOB: [birth year] Today's Date: 04/13/2020    History of present illness 64 yo male with Esophageal cancer. s/p 02/24/20 robotic assisted Ivor-Lewis esophagectomy with jejunostomy tube placement. Pt with increasing shortness of breath and fever with chest tubes placed x 2 (11/1 and 11/2)   OT comments  Pt toileted with assist for pericare and transferred to sit up in recliner. Brushed and braided pt's hair in chair where he remained at end of session. Changed soiled gown with min assist. Pt with Sp02 88-92% on RA. Offered to walk with pt, states he has someone coming to walk with him this afternoon.   Follow Up Recommendations  Home health OT;Supervision/Assistance - 24 hour    Equipment Recommendations  3 in 1 bedside commode    Recommendations for Other Services      Precautions / Restrictions Precautions Precautions: Fall Precaution Comments: chest tube x 2, gtube       Mobility Bed Mobility Overal bed mobility: Needs Assistance Bed Mobility: Supine to Sit     Supine to sit: Supervision;HOB elevated     General bed mobility comments: Supervision for lines  Transfers Overall transfer level: Needs assistance Equipment used: None Transfers: Sit to/from [facility] Sit to Stand: Min guard Stand pivot transfers: Min guard       General transfer comment: bed>BS>bed>chair    Balance Overall balance assessment: Needs assistance   Sitting balance-Leahy Scale: Good     Standing balance support: Single extremity supported;During functional activity Standing balance-Leahy Scale: Poor Standing balance comment: Reliant on UE support                           ADL either performed or assessed with clinical judgement   ADL Overall ADL's : Needs assistance/impaired     Grooming: Brushing hair;Sitting;Total assistance Grooming Details (indicate cue type and  reason): brushed and braided pt's hair         Upper Body Dressing : Minimal assistance;Sitting Upper Body Dressing Details (indicate cue type and reason): Min A for donning new gown     Toilet Transfer: Min guard;Stand-pivot;BSC   Toileting- Clothing Manipulation and Hygiene: Total assistance;Sit to/from stand         General ADL Comments: pt requested to remain in chair at end of session     Vision       Perception     Praxis      Cognition Arousal/Alertness: Awake/alert Behavior During Therapy: Flat affect Overall Cognitive Status: Within Functional Limits for tasks assessed                                          Exercises     Shoulder Instructions       General Comments      Pertinent Vitals/ Pain       Pain Assessment: No/denies pain  Home Living                                          Prior Functioning/Environment              Frequency  Min 2X/week        Progress Toward Goals  OT Goals(current goals can  now be found in the care plan section)  Progress towards OT goals: Progressing toward goals  Acute Rehab OT Goals Patient Stated Goal: to get out of the hospital OT Goal Formulation: With patient Time For Goal Achievement: 04/27/20 Potential to Achieve Goals: Good  Plan Discharge plan remains appropriate    Co-evaluation                 AM-PAC OT "6 Clicks" Daily Activity     Outcome Measure   Help from another person eating meals?: None Help from another person taking care of personal grooming?: Total Help from another person toileting, which includes using toliet, bedpan, or urinal?: A Lot Help from another person bathing (including washing, rinsing, drying)?: A Lot Help from another person to put on and taking off regular upper body clothing?: A Little Help from another person to put on and taking off regular lower body clothing?: A Little 6 Click Score: 15    End of Session     OT Visit Diagnosis: Muscle weakness (generalized) (M62.81)   Activity Tolerance Patient tolerated treatment well   Patient Left in chair;with call bell/phone within reach   Nurse Communication          Time: 9833-8250 OT Time Calculation (min): 51 min  Charges: OT General Charges $OT Visit: 1 Visit OT Treatments $Self Care/Home Management : 38-52 mins  Nestor Lewandowsky, OTR/L Acute Rehabilitation Services Pager: [phone] Office: [phone]   Malka So 04/13/2020, 10:28 AM

## 2020-04-13 NOTE — Progress Notes (Addendum)
      [address]       [place] [address]             [phone]      2 Days Post-Op Procedure(s) (LRB): ESOPHAGOGASTROSCOPY WITH ESOPHAGEAL STENT PLACEMENT (N/A) LEFT VIDEO ASSISTED THORACOSCOPY (VATS) WITH IRRIGATION AND CHEST TUBE PLACEMENT (Left)   Subjective:  Patient without new complaints.  He again states he is thirsty.  Encouraged patient to work with PT/OT  Objective: Vital signs in last 24 hours: Temp:  [97.9 F (36.6 C)-99.9 F (37.7 C)] 98.2 F (36.8 C) (12/09 0513) Pulse Rate:  [87-93] 87 (12/09 0756) Cardiac Rhythm: Normal sinus rhythm (12/09 0700) Resp:  [19-20] 19 (12/09 0756) BP: (99-113)/(58-66) 113/66 (12/09 0756) SpO2:  [89 %-91 %] 89 % (12/09 0756) Weight:  [94.7 kg] 94.7 kg (12/09 0513)  Intake/Output from previous day: 12/08 0701 - 12/09 0700 In: 1410 [NG/GT:1410] Out: 1520 [Urine:1150; Chest Tube:370]  General appearance: alert, cooperative and no distress Heart: regular rate and rhythm Lungs: clear to auscultation bilaterally Abdomen: soft, non-tender; bowel sounds normal; no masses,  no organomegaly Extremities: extremities normal, atraumatic, no cyanosis or edema Wound: clean and dry  Lab Results: No results for input(s): WBC, HGB, HCT, PLT in the last 72 hours. BMET: Recent Labs    04/11/20 0755 04/12/20 0615  NA 137 135  K 3.4* 3.6  CL 103 102  CO2 25 25  GLUCOSE 121* 110*  BUN 14 13  CREATININE 0.34* 0.40*  CALCIUM 7.5* 7.8*    PT/INR: No results for input(s): LABPROT, INR in the last 72 hours. ABG    Component Value Date/Time   PHART 7.454 (H) 02/25/2020 0500   HCO3 26.5 02/25/2020 0500   TCO2 27 02/24/2020 1944   ACIDBASEDEF 1.0 02/24/2020 1547   O2SAT 93.7 02/25/2020 0500   CBG (last 3)  Recent Labs    04/12/20 1613 04/12/20 2115 04/13/20 0641  GLUCAP 114* 92 130*    Assessment/Plan: S/P Procedure(s) (LRB): ESOPHAGOGASTROSCOPY WITH ESOPHAGEAL STENT PLACEMENT (N/A) LEFT VIDEO ASSISTED  THORACOSCOPY (VATS) WITH IRRIGATION AND CHEST TUBE PLACEMENT (Left)  1. CV- hemodynamically stable, Hypotension stable, slightly improved 2. Pulm- S/P VATS, CT output remains low, is serous in nature.. will be going home with CT to [facility], will convert closer to discharge 3. GI- NPO, continue tube feeds 4. Dispo- patient stable, continue tube feeds, otherwise NPO, CT output is low, needs to work with PT/OT, will start arranging discharge plans with TOC   LOS: 49 days    Erin Barrett, PA-C 04/13/2020   Chest tube output is significantly decreased. We will keep n.p.o. for now. Dispo planning.  Damita Eppard [name]

## 2020-04-13 NOTE — Anesthesia Postprocedure Evaluation (Signed)
Anesthesia Post Note  Patient: Brett Dawson  Procedure(s) Performed: ESOPHAGOGASTROSCOPY WITH ESOPHAGEAL STENT PLACEMENT (N/A ) LEFT VIDEO ASSISTED THORACOSCOPY (VATS) WITH IRRIGATION AND CHEST TUBE PLACEMENT (Left Chest)     Patient location during evaluation: PACU Anesthesia Type: General Level of consciousness: awake and alert Pain management: pain level controlled Vital Signs Assessment: post-procedure vital signs reviewed and stable Respiratory status: spontaneous breathing, nonlabored ventilation, respiratory function stable and patient connected to nasal cannula oxygen Cardiovascular status: blood pressure returned to baseline and stable Postop Assessment: no apparent nausea or vomiting Anesthetic complications: no   No complications documented.  Last Vitals:  Vitals:   04/13/20 0026 04/13/20 0513  BP: 101/61 101/64  Pulse: 87 88  Resp: 19 20  Temp: 36.6 C 36.8 C  SpO2: 91% 90%    Last Pain:  Vitals:   04/13/20 0513  TempSrc: Oral  PainSc:    Pain Goal: Patients Stated Pain Goal: 0 (03/04/20 0745)                 [facility]

## 2020-04-14 LAB — GLUCOSE, CAPILLARY
Glucose-Capillary: 111 mg/dL — ABNORMAL HIGH (ref 70–99)
Glucose-Capillary: 106 mg/dL — ABNORMAL HIGH (ref 70–99)
Glucose-Capillary: 99 mg/dL (ref 70–99)
Glucose-Capillary: 107 mg/dL — ABNORMAL HIGH (ref 70–99)

## 2020-04-14 NOTE — Progress Notes (Addendum)
      [address]       [place], [address]             [phone]      3 Days Post-Op Procedure(s) (LRB): ESOPHAGOGASTROSCOPY WITH ESOPHAGEAL STENT PLACEMENT (N/A) LEFT VIDEO ASSISTED THORACOSCOPY (VATS) WITH IRRIGATION AND CHEST TUBE PLACEMENT (Left)   Subjective:  No new complaints.  Worked with OT yesterday  Objective: Vital signs in last 24 hours: Temp:  [98 F (36.7 C)-98.8 F (37.1 C)] 98 F (36.7 C) (12/10 0725) Pulse Rate:  [77-98] 77 (12/10 0725) Cardiac Rhythm: Normal sinus rhythm (12/10 0500) Resp:  [16-24] 16 (12/10 0725) BP: (102-113)/(58-66) 110/60 (12/10 0725) SpO2:  [89 %-93 %] 90 % (12/10 0725) Weight:  [93.3 kg] 93.3 kg (12/10 0449)  Intake/Output from previous day: 12/09 0701 - 12/10 0700 In: 1820 [I.V.:20; NG/GT:1800] Out: 740 [Urine:550; Chest Tube:190]  General appearance: alert, cooperative and no distress Heart: regular rate and rhythm Lungs: clear to auscultation bilaterally Abdomen: soft, non-tender; bowel sounds normal; no masses,  no organomegaly Extremities: extremities normal, atraumatic, no cyanosis or edema Wound: clean and dry  Lab Results: No results for input(s): WBC, HGB, HCT, PLT in the last 72 hours. BMET: Recent Labs    04/11/20 0755 04/12/20 0615  NA 137 135  K 3.4* 3.6  CL 103 102  CO2 25 25  GLUCOSE 121* 110*  BUN 14 13  CREATININE 0.34* 0.40*  CALCIUM 7.5* 7.8*    PT/INR: No results for input(s): LABPROT, INR in the last 72 hours. ABG    Component Value Date/Time   PHART 7.454 (H) 02/25/2020 0500   HCO3 26.5 02/25/2020 0500   TCO2 27 02/24/2020 1944   ACIDBASEDEF 1.0 02/24/2020 1547   O2SAT 93.7 02/25/2020 0500   CBG (last 3)  Recent Labs    04/13/20 1557 04/13/20 2102 04/14/20 0616  GLUCAP 89 113* 111*    Assessment/Plan: S/P Procedure(s) (LRB): ESOPHAGOGASTROSCOPY WITH ESOPHAGEAL STENT PLACEMENT (N/A) LEFT VIDEO ASSISTED THORACOSCOPY (VATS) WITH IRRIGATION AND CHEST TUBE  PLACEMENT (Left)  1. CV- hemodynamically stable 2. Pulm- S/P VATS, CT output 190 cc, leave in place, will transition to [facility] closer to discharge 3. GI- Esophageal stent placed- remains NPO, continue tube feedings 4. Dispo- patient stable, CT output remains low, however looks milky in chest tube, continue NPO status, continue tube feeds, home health arrangements have been made, repeat CXR in AM   LOS: 50 days    Ellwood Handler, PA-C 04/14/2020   Agree with above. Chest tube output has markedly decreased. Likely residual purulence mixed with some blood. We will plan for discharge this weekend.  Kion Huntsberry [name]

## 2020-04-15 ENCOUNTER — Inpatient Hospital Stay (HOSPITAL_COMMUNITY): Payer: 59

## 2020-04-15 LAB — GLUCOSE, CAPILLARY
Glucose-Capillary: 123 mg/dL — ABNORMAL HIGH (ref 70–99)
Glucose-Capillary: 106 mg/dL — ABNORMAL HIGH (ref 70–99)
Glucose-Capillary: 125 mg/dL — ABNORMAL HIGH (ref 70–99)
Glucose-Capillary: 104 mg/dL — ABNORMAL HIGH (ref 70–99)

## 2020-04-15 NOTE — Progress Notes (Signed)
      [address]       [place] [address]             [phone]      4 Days Post-Op Procedure(s) (LRB): ESOPHAGOGASTROSCOPY WITH ESOPHAGEAL STENT PLACEMENT (N/A) LEFT VIDEO ASSISTED THORACOSCOPY (VATS) WITH IRRIGATION AND CHEST TUBE PLACEMENT (Left) Subjective: Feels okay this morning. Plans to talk to his daughter today about when she can get here from [place]  Objective: Vital signs in last 24 hours: Temp:  [98.3 F (36.8 C)-99.3 F (37.4 C)] 98.9 F (37.2 C) (12/11 0800) Pulse Rate:  [83-92] 83 (12/11 0334) Cardiac Rhythm: Normal sinus rhythm (12/11 0800) Resp:  [16-23] 19 (12/11 0334) BP: (103-111)/(61-71) 105/61 (12/11 0334) SpO2:  [90 %] 90 % (12/11 0334) Weight:  [92.4 kg] 92.4 kg (12/11 0356)     Intake/Output from previous day: 12/10 0701 - 12/11 0700 In: -  Out: 2440 [Urine:1150; Chest Tube:300] Intake/Output this shift: Total I/O In: -  Out: 440 [Urine:400; Chest Tube:40]  General appearance: alert, cooperative and no distress Heart: regular rate and rhythm, S1, S2 normal, no murmur, click, rub or gallop Lungs: clear to auscultation bilaterally Abdomen: soft, non-tender; bowel sounds normal; no masses,  no organomegaly Extremities: extremities normal, atraumatic, no cyanosis or edema Wound: clean and dry  Lab Results: No results for input(s): WBC, HGB, HCT, PLT in the last 72 hours. BMET: No results for input(s): NA, K, CL, CO2, GLUCOSE, BUN, CREATININE, CALCIUM in the last 72 hours.  PT/INR: No results for input(s): LABPROT, INR in the last 72 hours. ABG    Component Value Date/Time   PHART 7.454 (H) 02/25/2020 0500   HCO3 26.5 02/25/2020 0500   TCO2 27 02/24/2020 1944   ACIDBASEDEF 1.0 02/24/2020 1547   O2SAT 93.7 02/25/2020 0500   CBG (last 3)  Recent Labs    04/14/20 1607 04/14/20 2042 04/15/20 0625  GLUCAP 99 107* 123*    Assessment/Plan: S/P Procedure(s) (LRB): ESOPHAGOGASTROSCOPY WITH ESOPHAGEAL STENT PLACEMENT  (N/A) LEFT VIDEO ASSISTED THORACOSCOPY (VATS) WITH IRRIGATION AND CHEST TUBE PLACEMENT (Left)  1. CV- hemodynamically stable 2. Pulm- S/P VATS, CT output 440 cc, leave in place, will transition to Mini Express closer to discharge 3. GI- Esophageal stent placed- remains NPO, continue tube feedings 4. Dispo- patient stable, CT output increased, looks milky in chest tube, continue NPO status, continue tube feeds, nursing checking today to see if home health arrangements/tube feeds have been set up. He is not sure when his daughter can get here to pick him up since she is coming from [place].    LOS: 51 days    Elgie Collard 04/15/2020

## 2020-04-16 LAB — GLUCOSE, CAPILLARY
Glucose-Capillary: 109 mg/dL — ABNORMAL HIGH (ref 70–99)
Glucose-Capillary: 94 mg/dL (ref 70–99)
Glucose-Capillary: 113 mg/dL — ABNORMAL HIGH (ref 70–99)
Glucose-Capillary: 97 mg/dL (ref 70–99)

## 2020-04-16 MED ORDER — METOCLOPRAMIDE HCL 5 MG/ML IJ SOLN
5.0000 mg | Freq: Four times a day (QID) | INTRAMUSCULAR | Status: DC
  Administered 2020-04-16 – 2020-04-21 (×18): 5 mg via INTRAVENOUS
  Filled 2020-04-16 (×17): qty 2

## 2020-04-16 NOTE — Progress Notes (Signed)
      [address]       [place] [address]             [phone]      5 Days Post-Op Procedure(s) (LRB): ESOPHAGOGASTROSCOPY WITH ESOPHAGEAL STENT PLACEMENT (N/A) LEFT VIDEO ASSISTED THORACOSCOPY (VATS) WITH IRRIGATION AND CHEST TUBE PLACEMENT (Left) Subjective: Feels okay this morning. Coughing a little with production of thick mucous.   Objective: Vital signs in last 24 hours: Temp:  [98.1 F (36.7 C)-99.8 F (37.7 C)] 98.9 F (37.2 C) (12/12 0720) Pulse Rate:  [80-98] 83 (12/12 0720) Cardiac Rhythm: Normal sinus rhythm (12/12 0800) Resp:  [19-22] 19 (12/12 0720) BP: (98-110)/(62-69) 101/62 (12/12 0720) SpO2:  [90 %-91 %] 91 % (12/12 0720) Weight:  [89.1 kg] 89.1 kg (12/12 0436)     Intake/Output from previous day: 12/11 0701 - 12/12 0700 In: 8 [P.O.:8] Out: 3255 [Urine:2975; Chest Tube:280] Intake/Output this shift: Total I/O In: -  Out: 40 [Chest Tube:40]  General appearance: alert, cooperative and no distress Heart: regular rate and rhythm, S1, S2 normal, no murmur, click, rub or gallop Lungs: coarse chest sounds Abdomen: soft, non-tender; bowel sounds normal; no masses,  no organomegaly Extremities: extremities normal, atraumatic, no cyanosis or edema Wound: clean and dry  Lab Results: No results for input(s): WBC, HGB, HCT, PLT in the last 72 hours. BMET: No results for input(s): NA, K, CL, CO2, GLUCOSE, BUN, CREATININE, CALCIUM in the last 72 hours.  PT/INR: No results for input(s): LABPROT, INR in the last 72 hours. ABG    Component Value Date/Time   PHART 7.454 (H) 02/25/2020 0500   HCO3 26.5 02/25/2020 0500   TCO2 27 02/24/2020 1944   ACIDBASEDEF 1.0 02/24/2020 1547   O2SAT 93.7 02/25/2020 0500   CBG (last 3)  Recent Labs    04/15/20 1623 04/15/20 2040 04/16/20 0649  GLUCAP 125* 104* 109*    Assessment/Plan: S/P Procedure(s) (LRB): ESOPHAGOGASTROSCOPY WITH ESOPHAGEAL STENT PLACEMENT (N/A) LEFT VIDEO ASSISTED  THORACOSCOPY (VATS) WITH IRRIGATION AND CHEST TUBE PLACEMENT (Left)  1. CV- hemodynamically stable 2. Pulm- S/P VATS, CT output 20cc, leave in place, will transition to [facility] closer to discharge 3. GI- Esophageal stent placed- remains NPO, continue tube feedings 4. Dispo- patient stable, CT output decreased, looks milky in chest tube, continue NPO status, continue tube feeds, nursing checking today to see if home health arrangements/tube feeds have been set up. Planning for discharge tomorrow morning.  He is not sure when his daughter can get here to pick him up since she is coming from [place]. He did not ambulate at all yesterday so today he needs to ambulate at least once around the unit, the patient agreed to do so.    LOS: 52 days    Elgie Collard 04/16/2020

## 2020-04-17 LAB — GLUCOSE, CAPILLARY
Glucose-Capillary: 118 mg/dL — ABNORMAL HIGH (ref 70–99)
Glucose-Capillary: 113 mg/dL — ABNORMAL HIGH (ref 70–99)
Glucose-Capillary: 109 mg/dL — ABNORMAL HIGH (ref 70–99)
Glucose-Capillary: 97 mg/dL (ref 70–99)

## 2020-04-17 NOTE — Plan of Care (Signed)

## 2020-04-17 NOTE — Progress Notes (Addendum)
      [address]       [place] [address]             [phone]      6 Days Post-Op Procedure(s) (LRB): ESOPHAGOGASTROSCOPY WITH ESOPHAGEAL STENT PLACEMENT (N/A) LEFT VIDEO ASSISTED THORACOSCOPY (VATS) WITH IRRIGATION AND CHEST TUBE PLACEMENT (Left)   Subjective:  No new complaints.  He states his daughter isn't going to be able to come stay with him and provide assistance.  He states his landlord is disabled and is around 24 hours per day, unsure if he will be able to assist patient, but can check on him  Objective: Vital signs in last 24 hours: Temp:  [98.5 F (36.9 C)-100.5 F (38.1 C)] 98.5 F (36.9 C) (12/12 2327) Pulse Rate:  [86-96] 86 (12/13 0612) Cardiac Rhythm: Normal sinus rhythm (12/13 0612) Resp:  [17-22] 20 (12/13 0612) BP: (102-109)/(57-62) 102/62 (12/13 0612) SpO2:  [90 %-91 %] 91 % (12/13 0612) Weight:  [89 kg] 89 kg (12/13 0500)  Intake/Output from previous day: 12/12 0701 - 12/13 0700 In: 0  Out: 1540 [Urine:1300; Chest Tube:240]  General appearance: alert, cooperative and no distress Heart: regular rate and rhythm Lungs: clear to auscultation bilaterally Abdomen: soft, non-tender; bowel sounds normal; no masses,  no organomegaly Extremities: extremities normal, atraumatic, no cyanosis or edema Wound: clean and ddry  Lab Results: No results for input(s): WBC, HGB, HCT, PLT in the last 72 hours. BMET: No results for input(s): NA, K, CL, CO2, GLUCOSE, BUN, CREATININE, CALCIUM in the last 72 hours.  PT/INR: No results for input(s): LABPROT, INR in the last 72 hours. ABG    Component Value Date/Time   PHART 7.454 (H) 02/25/2020 0500   HCO3 26.5 02/25/2020 0500   TCO2 27 02/24/2020 1944   ACIDBASEDEF 1.0 02/24/2020 1547   O2SAT 93.7 02/25/2020 0500   CBG (last 3)  Recent Labs    04/16/20 1616 04/16/20 2122 04/17/20 0605  GLUCAP 113* 97 118*    Assessment/Plan: S/P Procedure(s) (LRB): ESOPHAGOGASTROSCOPY WITH  ESOPHAGEAL STENT PLACEMENT (N/A) LEFT VIDEO ASSISTED THORACOSCOPY (VATS) WITH IRRIGATION AND CHEST TUBE PLACEMENT (Left)  1. CV- Sinus Tach- patient denies chest pain 2. Pulm- CT output 240 cc yesterday, will discuss which chest tube will need converted to NSR 3. GI- esophageal stent placed last week, patient tolerating tube feedings, remains NPO 4. Dispo- patient with several social issues, his daughter is unable to come and assist patient at discharge.  He states his landlord is around 24/7 as he is disabled.  Unsure if he can assist patient with care and tube feedings, chest tube management.... social issues need to be resolved prior to patient being discharged   LOS: 53 days    Ellwood Handler, PA-C 04/17/2020  Ready for discharge dispo planning Will keep NPO for now  [employer]

## 2020-04-17 NOTE — Progress Notes (Signed)
Referring Physician(s): DR Tami Ribas  Supervising Physician: Dr. 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  acetaminophen (TYLENOL) 160 MG/5ML solution 650 mg, 650 mg, Per Tube, Q4H PRN, Gold, Wayne E, PA-C, 650 mg at 04/11/20 2103 .  Chlorhexidine Gluconate Cloth 2 % PADS 6 each, 6 each, Topical, Daily, Lightfoot, Lucile Crater, MD, 6 each at 04/16/20 1600 .  enoxaparin (LOVENOX) injection 40 mg, 40 mg, Subcutaneous, Q24H, Gold, Wayne E, PA-C, 40 mg at 04/16/20 1748 .  feeding supplement (OSMOLITE 1.5 CAL) liquid 1,000 mL, 1,000 mL, Per Tube, Continuous, Lightfoot, Harrell O, MD, Last Rate: 75 mL/hr at 04/16/20 2116, 1,000 mL at 04/16/20 2116 .  feeding supplement (PROSource TF) liquid 90 mL, 90 mL, Per Tube, TID, Lajuana Matte, MD, 90 mL at 04/17/20 0928 .  fluticasone (FLONASE) 50 MCG/ACT nasal spray 2 spray, 2 spray, Each Nare, Daily, Gold, Wayne E, PA-C, 2 spray at 04/08/20 1028 .  free water 75 mL, 75 mL, Per Tube, Q4H, Lightfoot, Harrell O, MD, 75 mL at 04/17/20 0800 .  guaiFENesin (ROBITUSSIN) 100 MG/5ML solution 300 mg, 15 mL, Per Tube, Q4H PRN, Gold, Wayne E, PA-C, 300 mg at 04/16/20 1048 .  insulin aspart (novoLOG) injection 0-15 Units, 0-15 Units, Subcutaneous, TID WC, Barrett, Erin R, PA-C, 2 Units at 04/15/20 1708 .  levalbuterol (XOPENEX) nebulizer solution 0.63 mg, 0.63 mg, Nebulization, Q6H PRN, Gold, Wayne E, PA-C, 0.63 mg at 03/12/20 2337 .  lipiodol ultrafluid injection, , , PRN, Sandi Mariscal, MD, 20 mL at 03/23/20 1139 .  LORazepam (ATIVAN) injection 0.5 mg, 0.5 mg, Intravenous, Q6H PRN, Gold, Wayne E, PA-C, 0.5 mg at 04/16/20 2327 .  melatonin tablet 3 mg,  3 mg, Per Tube, QHS, Gold, Wayne E, PA-C, 3 mg at 04/16/20 2115 .  metoCLOPramide (REGLAN) injection 5 mg, 5 mg, Intravenous, Q6H, Atkins, Glenice Bow, MD, 5 mg at 04/17/20 0657 .  nystatin ointment (MYCOSTATIN), , Topical, BID, John Giovanni, PA-C, Given at 04/16/20 2116 .  ondansetron (ZOFRAN) injection 4 mg, 4 mg, Intravenous, Q4H PRN, Gold, Wayne E, PA-C .  pantoprazole sodium (PROTONIX) 40 mg/20 mL oral suspension 40 mg, 40 mg, Per Tube, BID, Gold, Wayne E, PA-C, 40 mg at 04/17/20 0929 .  saline (AYR) nasal gel with aloe 1 application, 1 application, Each Nare, Q4H PRN, Gold, Wayne E, PA-C, 1 application at 16/60/63 1715 .  silver sulfADIAZINE (SILVADENE) 1 % cream, , Topical, BID, John Giovanni, PA-C, Given at 04/16/20 2116 .  sodium chloride (OCEAN) 0.65 % nasal spray 1 spray, 1 spray, Each Nare, PRN, Gold, Wayne E, PA-C, 1 spray at 03/13/20 1300 .  sodium chloride flush (NS) 0.9 % injection 10-40 mL, 10-40 mL, Intracatheter, Q12H, Gold, Wayne E, PA-C, 10 mL at 04/16/20 2116    Vital Signs: BP (!) 88/60 (BP Location: Left Arm)   Pulse 90   Temp 97.8 F (36.6 C) (Axillary)   Resp 20   Ht 5\' 11"  (1.803 m)   Wt 89 kg   SpO2 90%   BMI 27.37 kg/m   Physical Exam Vitals reviewed.  Pulmonary:     Effort: Pulmonary effort is normal.  Breath sounds: Normal breath sounds.  Skin:    General: Skin is warm and dry.     Comments: Sites of chest tubes are clean and dry NT no bleeding OP in chest tubes now serous color No air leak in either CT  200 mL in CT #2 70 mL OP in CT #1     Imaging: DG CHEST PORT 1 VIEW  Result Date: 04/15/2020 CLINICAL DATA:  Status post esophagectomy. EXAM: PORTABLE CHEST 1 VIEW COMPARISON:  April 12, 2020. FINDINGS: Stable cardiomediastinal silhouette. Esophageal stent is again noted. Right-sided PICC line is unchanged. No pneumothorax is noted. Two left-sided chest tubes are unchanged. Stable bibasilar atelectasis or infiltrates are noted with  associated pleural effusions. Bony thorax is unremarkable. IMPRESSION: Stable support apparatus. Stable bibasilar atelectasis or infiltrates are noted with associated pleural effusions. No pneumothorax is noted. Electronically Signed   By: Marijo Conception M.D.   On: 04/15/2020 08:17    Labs:  CBC: Recent Labs    04/03/20 0530 04/05/20 0500 04/07/20 0046 04/10/20 0541  WBC 9.9 6.1 6.0 6.8  HGB 8.9* 8.4* 8.7* 8.5*  HCT 27.7* 26.4* 26.2* 27.3*  PLT 224 197 221 266    COAGS: Recent Labs    02/22/20 0830 03/08/20 0015 03/23/20 0446 04/07/20 0046  INR 1.0 1.1 1.2 1.3*  APTT 30  --   --   --     BMP: Recent Labs    12/27/19 0810 01/03/20 0813 01/11/20 0814 01/26/20 1426 02/22/20 0830 04/09/20 0405 04/10/20 0541 04/11/20 0755 04/12/20 0615  NA 136 140 139 140   < > 135 135 137 135  K 4.0 3.9 4.2 4.2   < > 3.7 3.4* 3.4* 3.6  CL 107 106 105 106   < > 101 102 103 102  CO2 23 27 29 29    < > 26 27 25 25   GLUCOSE 117* 127* 105* 99   < > 131* 133* 121* 110*  BUN 14 12 11 10    < > 22 19 14 13   CALCIUM 9.4 9.4 9.0 8.8*   < > 7.5* 7.5* 7.5* 7.8*  CREATININE 0.66 0.63 0.68 0.71   < > 0.39* 0.40* 0.34* 0.40*  GFRNONAA >60 >60 >60 >60   < > >60 >60 >60 >60  GFRAA >60 >60 >60 >60  --   --   --   --   --    < > = values in this interval not displayed.    LIVER FUNCTION TESTS: Recent Labs    04/01/20 0340 04/03/20 0530 04/06/20 0415 04/10/20 0541  BILITOT 0.6 0.5 0.4 0.3  AST 18 15 14* 12*  ALT 28 19 14 12   ALKPHOS 113 88 76 66  PROT 5.5* 5.1* 4.9* 4.5*  ALBUMIN 1.8* 1.7* 1.6* 1.4*    Assessment and Plan: Thoracic duct embolization in IR 12/3 OP slowing -- now more serous Plan per CTS  Electronically Signed: Ascencion Dike, PA-C 04/17/2020, 11:07 AM   I spent a total of 15 Minutes at the the patient's bedside AND on the patient's hospital floor or unit, greater than 50% of which was counseling/coordinating care for left chest tube

## 2020-04-17 NOTE — TOC Progression Note (Addendum)
Transition of Care [facility]) - Progression Note    Patient Details  Name: Brett Dawson MRN: [id number] Date of Birth: 12-Jul-1955  [facility] CM/SW Contact  Zenon Mayo, RN Phone Number: 04/17/2020, 3:11 PM  Clinical Narrative:    NCM spoke with patient, he states he will not have good support at home to help him with his continous tube feeds and the mini chest tube express.  NCM informed him that [facility] will not come out every day they only come out two to three times a week.  He states his daughter lives in [place] and will not be able to come here to help him.  He has a brother who lives in town but he will not be able to help him either.  NCM contacted Cecille Rubin with [facility] to see if they would be able to be of any assistance to [place] along with [facility] being the primary [place].  NCM awaiting to hear back from [facility].  NCM contacted Carolynn Sayers updated her on this information as well.    12/14- NCM received call from [place] with [facility], she states patient will have to be able to do his care at home also and be able to be mobile in order to care for himself, [facility] services will not be coming out everyday.  NCM informed MD of this information and Staff RN,  Staff RN will order physical therapy to see patient again.  He has refused physical therapy the last three times so they signed off , but staff RN will order again.         Expected Discharge Plan and Services                                                 Social Determinants of Health (SDOH) Interventions    Readmission Risk Interventions No flowsheet data found.

## 2020-04-17 NOTE — Plan of Care (Signed)
  Problem: Education: ?Goal: Knowledge of General Education information will improve ?Description: Including pain rating scale, medication(s)/side effects and non-pharmacologic comfort measures ?Outcome: Progressing ?  ?Problem: Clinical Measurements: ?Goal: Ability to maintain clinical measurements within normal limits will improve ?Outcome: Progressing ?  ?Problem: Activity: ?Goal: Risk for activity intolerance will decrease ?Outcome: Progressing ?  ?Problem: Nutrition: ?Goal: Adequate nutrition will be maintained ?Outcome: Progressing ?  ?Problem: Pain Managment: ?Goal: General experience of comfort will improve ?Outcome: Progressing ?  ?Problem: Skin Integrity: ?Goal: Risk for impaired skin integrity will decrease ?Outcome: Progressing ?  ?

## 2020-04-17 NOTE — Progress Notes (Signed)
OT Cancellation Note  Patient Details Name: Brett Dawson MRN: [id number] DOB: 1955/09/30   Cancelled Treatment:    Reason Eval/Treat Not Completed: Patient declined, no reason specified;Other (comment) pt declined OT session. Pt focused on "figuring out DC plan" as pt reports his roommate can't perform his dressing changes at home and seemed internally distracted by news of pt needing dressing changes at home. Encouraged pt to work with OT to facilitate functional mobility and ADL participation to facilitate independence with tasks such as wound care. Pt declined. Will check back as time allows.   Lanier Clam., COTA/L Acute Rehabilitation Services [phone] [place] 04/17/2020, 2:12 PM

## 2020-04-18 LAB — GLUCOSE, CAPILLARY
Glucose-Capillary: 140 mg/dL — ABNORMAL HIGH (ref 70–99)
Glucose-Capillary: 90 mg/dL (ref 70–99)
Glucose-Capillary: 90 mg/dL (ref 70–99)
Glucose-Capillary: 128 mg/dL — ABNORMAL HIGH (ref 70–99)

## 2020-04-18 MED ORDER — OSMOLITE 1.5 CAL PO LIQD: 1000.0000 mL | ORAL | Status: DC

## 2020-04-18 MED ORDER — PROSOURCE TF PO LIQD
45.0000 mL | Freq: Three times a day (TID) | ORAL | Status: DC
  Administered 2020-04-18 – 2020-04-20 (×9): 45 mL
  Filled 2020-04-18 (×8): qty 45

## 2020-04-18 MED ORDER — BOOST / RESOURCE BREEZE PO LIQD CUSTOM
1.0000 | Freq: Three times a day (TID) | ORAL | Status: DC
  Administered 2020-04-18 – 2020-04-20 (×5): 1 via ORAL

## 2020-04-18 MED ORDER — OSMOLITE 1.5 CAL PO LIQD
1760.0000 mL | ORAL | Status: DC
  Administered 2020-04-18: 1760 mL
  Administered 2020-04-19: 1000 mL
  Administered 2020-04-20: 1760 mL
  Filled 2020-04-18 (×3): qty 2000

## 2020-04-18 NOTE — Progress Notes (Addendum)
[address]       [place] [address]             [phone]       7 Days Post-Op Procedure(s) (LRB): ESOPHAGOGASTROSCOPY WITH ESOPHAGEAL STENT PLACEMENT (N/A) LEFT VIDEO ASSISTED THORACOSCOPY (VATS) WITH IRRIGATION AND CHEST TUBE PLACEMENT (Left)   Subjective:  The patient complains of coughing up a large amount of yellowish fluid 400-450 cc daily.  The patient's roommate is unable to assist him with dressing changes/care post discharge  Objective: Vital signs in last 24 hours: Temp:  [97.6 F (36.4 C)-100.3 F (37.9 C)] 98.4 F (36.9 C) (12/14 0349) Pulse Rate:  [82-99] 82 (12/14 0349) Cardiac Rhythm: Normal sinus rhythm (12/14 0700) Resp:  [16-20] 18 (12/14 0349) BP: (97-103)/(58-69) 103/59 (12/14 0349) SpO2:  [90 %-92 %] 91 % (12/14 0349) Weight:  [88 kg] 88 kg (12/14 0349)  Intake/Output from previous day: 12/13 0701 - 12/14 0700 In: 0  Out: 1210 [Urine:1050; Chest Tube:160]  General appearance: alert, cooperative and no distress Heart: regular rate and rhythm Lungs: clear to auscultation bilaterally Abdomen: soft, non-tender; bowel sounds normal; no masses,  no organomegaly Extremities: extremities normal, atraumatic, no cyanosis or edema Wound: clean and dry  Lab Results: No results for input(s): WBC, HGB, HCT, PLT in the last 72 hours. BMET: No results for input(s): NA, K, CL, CO2, GLUCOSE, BUN, CREATININE, CALCIUM in the last 72 hours.  PT/INR: No results for input(s): LABPROT, INR in the last 72 hours. ABG    Component Value Date/Time   PHART 7.454 (H) 02/25/2020 0500   HCO3 26.5 02/25/2020 0500   TCO2 27 02/24/2020 1944   ACIDBASEDEF 1.0 02/24/2020 1547   O2SAT 93.7 02/25/2020 0500   CBG (last 3)  Recent Labs    04/17/20 1848 04/17/20 2150 04/18/20 0626  GLUCAP 109* 97 140*    Assessment/Plan: S/P Procedure(s) (LRB): ESOPHAGOGASTROSCOPY WITH ESOPHAGEAL STENT PLACEMENT (N/A) LEFT VIDEO ASSISTED THORACOSCOPY (VATS)  WITH IRRIGATION AND CHEST TUBE PLACEMENT (Left)  1. CV- sinus tach 2. Pulm- pigtail catheter output remains minimal, may be able to remove today.. CT output appears to be tube feedings? Will get CXR to assess for pleural effusion 3. GI- esophageal stent placed last week, plan was to start clear liquids today, however with what appears to be tube feedings leaking into chest tube, Im concerned the patient's esophageal stent could have moved or something in his abdominal cavity, his abdominal exam is benign 4. Disposition- patient continues to have no one to help with care at discharge, home health nursing will provide education, but not be available all day to assist patient with care.  He will require assistance with managing tube feedings and chest tube dressing changes, may need to look into SNF, however this was done prior to surgery and evidently was denied.   Will discuss above mentioned issues with Dr. Kipp Brood, continue current care for now   LOS: 54 days    Erin Barrett, PA-C 04/18/2020   Patient only drank 160 over the past 24 hours.  The output is thickened quality, but this is essentially an empyema drain.  Given the amount of normal tube feeds that he is on is unlikely that he has a recurrent chyle leak, especially considering that his entire thoracic duct has been embolized.  I also had a very long discussion with the daughter in regards to discharge planning.  Apparently Mr. Radloff has a brother who lives 5 minutes away,  and has agreed to check on him every night.  His daughter will also be in town this weekend.  We will plan for discharge this Friday. Continue nocturnal tube feeds and pigtail drain will be moved.  Talene Glastetter [name]

## 2020-04-18 NOTE — Evaluation (Signed)
Physical Therapy Evaluation Patient Details Name: Brett Dawson MRN: [id number] DOB: 1956-01-23 Today's Date: 04/18/2020   History of Present Illness  64 yo male with Esophageal cancer. s/p 02/24/20 robotic assisted Ivor-Lewis esophagectomy with jejunostomy tube placement. Pt with increasing shortness of breath and fever with chest tubes placed x 2 (11/1 and 11/2). Lymphangiogram with thoracic duct embolization on 11.18.21 and 12.3.21.  Clinical Impression  Pt admitted secondary to problem above with deficits below. Pt requiring min guard A to transfer to chair. Requesting to defer further mobility as he wanted to eat. Educated about importance of mobility. Pt continues to refuse SNF, and reports he is wanting to go home. Will continue to follow acutely.     Follow Up Recommendations Home health PT;Supervision/Assistance - 24 hour (refusing SNF)    Equipment Recommendations  3in1 (PT)    Recommendations for Other Services       Precautions / Restrictions Precautions Precautions: Fall Precaution Comments: Chest tube and G tube Restrictions Weight Bearing Restrictions: No      Mobility  Bed Mobility Overal bed mobility: Needs Assistance Bed Mobility: Supine to Sit     Supine to sit: Supervision     General bed mobility comments: Supervision for safety    Transfers Overall transfer level: Needs assistance Equipment used: None Transfers: Sit to/from Stand;Stand Pivot Transfers Sit to Stand: Min guard Stand pivot transfers: Min guard       General transfer comment: Min guard for safety to stand and transfer to chair. Holding to chair armrests for support. Pt refusing further mobility as he wanted to eat.  Ambulation/Gait                Stairs            Wheelchair Mobility    Modified Rankin (Stroke Patients Only)       Balance                                             Pertinent Vitals/Pain Pain Assessment: No/denies pain     Home Living Family/patient expects to be discharged to:: Private residence Living Arrangements: Alone Available Help at Discharge: Family;Friend(s);Available PRN/intermittently Type of Home: House Home Access: Stairs to enter Entrance Stairs-Rails: None Entrance Stairs-Number of Steps: 3 Home Layout: One level Home Equipment: Environmental consultant - 2 wheels      Prior Function Level of Independence: Independent               Hand Dominance        Extremity/Trunk Assessment   Upper Extremity Assessment Upper Extremity Assessment: Defer to OT evaluation    Lower Extremity Assessment Lower Extremity Assessment: Generalized weakness    Cervical / Trunk Assessment Cervical / Trunk Assessment: Normal  Communication   Communication: No difficulties  Cognition Arousal/Alertness: Awake/alert Behavior During Therapy: Flat affect Overall Cognitive Status: Within Functional Limits for tasks assessed                                 General Comments: Self limiting      General Comments      Exercises     Assessment/Plan    PT Assessment Patient needs continued PT services  PT Problem List Decreased balance;Decreased activity tolerance;Decreased mobility       PT Treatment Interventions DME instruction;Gait  training;Stair training;Functional mobility training;Therapeutic activities;Therapeutic exercise;Balance training;Cognitive remediation;Patient/family education    PT Goals (Current goals can be found in the Care Plan section)  Acute Rehab PT Goals Patient Stated Goal: to get out of the hospital PT Goal Formulation: With patient Time For Goal Achievement: 05/02/20 Potential to Achieve Goals: Fair    Frequency Min 3X/week   Barriers to discharge Decreased caregiver support      Co-evaluation               AM-PAC PT "6 Clicks" Mobility  Outcome Measure Help needed turning from your back to your side while in a flat bed without using  bedrails?: None Help needed moving from lying on your back to sitting on the side of a flat bed without using bedrails?: None Help needed moving to and from a bed to a chair (including a wheelchair)?: A Little Help needed standing up from a chair using your arms (e.g., wheelchair or bedside chair)?: A Little Help needed to walk in hospital room?: A Little Help needed climbing 3-5 steps with a railing? : A Lot 6 Click Score: 19    End of Session Equipment Utilized During Treatment: Oxygen Activity Tolerance: Patient tolerated treatment well Patient left: in chair;with call bell/phone within reach Nurse Communication: Mobility status PT Visit Diagnosis: Other abnormalities of gait and mobility (R26.89)    Time: 9528-4132 PT Time Calculation (min) (ACUTE ONLY): 15 min   Charges:   PT Evaluation $PT Eval Moderate Complexity: 1 Mod          Lou Miner, DPT  Acute Rehabilitation Services  Pager: [phone] Office: [phone]   Rudean Hitt 04/18/2020, 2:03 PM

## 2020-04-18 NOTE — Progress Notes (Signed)
Nutrition Follow-up  DOCUMENTATION CODES:   Severe malnutrition in context of acute illness/injury  INTERVENTION:   Transition to nocturnal tube feeds via J-tube: - Osmolite 1.5 @ 110 ml/hr x 16 hours from 1800 to 1000 (1760 ml/day) - ProSource TF 45 ml TID - Continue free water flushes of 75 ml q 4 hours  Tube feeding regimen at new goal rate provides 2760 kcal, 110 grams of protein, and 1341 ml of H2O (meets 100% of estimated kcal needs and 95% of estimated protein needs).  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  Ongoing  GOAL:   Patient will meet greater than or equal to 90% of their needs  Met via TF  MONITOR:   Diet advancement,Labs,Weight trends,TF tolerance,Skin,I & O's  REASON FOR ASSESSMENT:   Consult Enteral/tube feeding initiation and management  ASSESSMENT:   64 year old male who presented on 10/21 for Ivor Lewis esophagectomy and J-tube placement. 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  10/22 - TF initiated 10/26 - swallow study, no evidence of leak, NGT removed 10/27 - MBS, diet advanced to clears, transitioned to nocturnal TF 10/31 - s/p thoracentesis with 1 L fluid removed 11/01 - chest tube placed, NPO 11/03 - left chest tube placed by IR 11/04 - clear liquids 11/05 - JP drain removed 11/18 - lymphangiogram and unsuccessful thoracic duct embolization 11/19 - PICC placed, TPN initiated10/22 - TF initiated 10/26 - swallow study, no evidence of leak, NGT removed 10/27 - MBS, diet advanced to clears, transitioned to nocturnal TF 10/31 - s/p thoracentesis with 1 L fluid removed 11/01 - chest tube placed, NPO 11/03 - left chest  tube placed by IR 11/04 - clear liquids 11/05 - JP drain removed 11/18 - lymphangiogram and unsuccessful thoracic duct embolization 11/19 - PICC placed, TPN initiated 12/03 - bilateral lymphangiogram and successful thoracic duct embolization 12/07 - esophagoscopy with esophageal stent placement, left VATS for chest irrigation and chest tube placement, TPN d/c, TF restarted 12/14 - diet advanced to clear liquids  Consult received to transition pt to nocturnal tube feeds over 16 hours in anticipation of discharge. Discussed with MD and RN.  Spoke with pt at bedside. He is glad to be back on clear liquids. He is willing to try Boost Breeze supplements again and requests wild berry flavor. RD provided pt with a Boost Breeze at end of visit. RN aware. Clear liquid lunch tray arrived and pt going to get himself set up for lunch.  Admit weight: 99.6 kg Current weight:88 kg  Medications reviewed and include: SSI, IV reglan 5 mg q 6 hours, protonix  Labs reviewed. CBG's: 97-140 x 24 hours  UOP: 1050 ml x 24 hours CTs: 160 ml total x 24 hours  Diet Order:   Diet Order            Diet clear liquid Room service appropriate? Yes; Fluid consistency: Thin  Diet effective now                 EDUCATION NEEDS:   Education needs have been addressed  Skin:  Skin Assessment: Skin Integrity Issues: Incisions: chest x 2, abdomen x 3  Last BM:  04/16/20  Height:   Ht Readings from Last  1 Encounters:  02/24/20 '5\' 11"'  (1.803 m)    Weight:   Wt Readings from Last 1 Encounters:  04/18/20 88 kg    Ideal Body Weight:  78.2 kg  BMI:  Body mass index is 27.06 kg/m.  Estimated Nutritional Needs:   Kcal:  8850-2774  Protein:  150-170 grams  Fluid:  >/= 2.2 L    Brett Bryant, MS, RD, LDN Inpatient Clinical Dietitian Please see AMiON for contact information.

## 2020-04-18 NOTE — Plan of Care (Signed)
  Problem: Education: Goal: Knowledge of General Education information will improve Description: Including pain rating scale, medication(s)/side effects and non-pharmacologic comfort measures Outcome: Progressing   Problem: Health Behavior/Discharge Planning: Goal: Ability to manage health-related needs will improve Outcome: Progressing   Problem: Clinical Measurements: Goal: Ability to maintain clinical measurements within normal limits will improve Outcome: Progressing   Problem: Clinical Measurements: Goal: Diagnostic test results will improve Outcome: Progressing   Problem: Activity: Goal: Risk for activity intolerance will decrease Outcome: Progressing   Problem: Nutrition: Goal: Adequate nutrition will be maintained Outcome: Progressing   Problem: Pain Managment: Goal: General experience of comfort will improve Outcome: Progressing   Problem: Skin Integrity: Goal: Risk for impaired skin integrity will decrease Outcome: Progressing

## 2020-04-18 NOTE — Progress Notes (Signed)
64 y.o. male inpatient. History of  Esophagectomy with a persistent left chylous pleural effusion s/p chest tube placement on 11.3.21 and lymphangiogram with thoracic duct embolization on 11.18.21 and 12.3.21.  Request from CT surgery for chest tube removal.   Per Epic output is 70 ml, 90 ml of serosanguinous fluid. 28 Fr left pleural chest tube placed by CT surgery to remain in place (out put of 200 ml, 190 ml). Confirmed with CT Surgery that chest tube should be removed at this time. Team declines additional testing of output for  triglycerides to rule out  persistent lymphatic leak prior to removal.   Informed verbal consent was obtained from the patient after a discussion of the risks, benefits and alternatives to treatment. The 12 Fr  left lateral chest tube was prepped.  The external suture was cut and the chest tube was removed successfully without incident. Skin site unremarkable with no erythema, tenderness or drainage noted. Petroleum gauze placed over the  exit site with a clean dressing and Tegaderm applied over the petroleum gauze.    All questions of the patient's were answered at this time.

## 2020-04-19 ENCOUNTER — Inpatient Hospital Stay (HOSPITAL_COMMUNITY): Payer: 59

## 2020-04-19 LAB — GLUCOSE, CAPILLARY
Glucose-Capillary: 131 mg/dL — ABNORMAL HIGH (ref 70–99)
Glucose-Capillary: 85 mg/dL (ref 70–99)
Glucose-Capillary: 88 mg/dL (ref 70–99)
Glucose-Capillary: 114 mg/dL — ABNORMAL HIGH (ref 70–99)

## 2020-04-19 NOTE — Progress Notes (Addendum)
Physical Therapy Treatment Patient Details Name: Brett Dawson MRN: [id number] DOB: November 21, 1955 Today's Date: 04/19/2020    History of Present Illness 64 yo male with Esophageal cancer. s/p 02/24/20 robotic assisted Ivor-Lewis esophagectomy with jejunostomy tube placement. Pt with increasing shortness of breath and fever with chest tubes placed x 2 (11/1 and 11/2). Lymphangiogram with thoracic duct embolization on 11.18.21 and 12.3.21.    PT Comments    Pt agreeable to therapy today. Pt looking forward to discharge this week, however is afraid of being disappointed again. Despite pt decreased activity over last couple of weeks pt has surprising strength and endurance, however has had some loss in strength and balance. Pt is min guard for bed mobility, transfers and ambulation of 200 feet with Rollator. Pt reports there is no way he will need to walk that far as his apartment if very small. D/c plan remains appropriate at this time. PT will continue to follow acutely until d/c.      Follow Up Recommendations  Home health PT;Supervision/Assistance - 24 hour (refusing SNF)     Equipment Recommendations  3in1 (PT)       Precautions / Restrictions Precautions Precautions: Fall Precaution Comments: Chest tube and G tube Restrictions Weight Bearing Restrictions: No    Mobility  Bed Mobility Overal bed mobility: Needs Assistance Bed Mobility: Supine to Sit     Supine to sit: Supervision     General bed mobility comments: Supervision for safety  Transfers Overall transfer level: Needs assistance Equipment used: None;4-wheeled walker Transfers: Sit to/from Stand;Stand Pivot Transfers Sit to Stand: Min guard         General transfer comment: min guard for safety  Ambulation/Gait Ambulation/Gait assistance: Min guard Gait Distance (Feet): 200 Feet Assistive device: 4-wheeled walker Gait Pattern/deviations: Step-through pattern;WFL(Within Functional Limits) Gait velocity:  slowed Gait velocity interpretation: <1.8 ft/sec, indicate of risk for recurrent falls General Gait Details: VSS on RA          Balance Overall balance assessment: Needs assistance Sitting-balance support: Feet supported;No upper extremity supported Sitting balance-Leahy Scale: Good     Standing balance support: Single extremity supported;During functional activity Standing balance-Leahy Scale: Poor Standing balance comment: reliant on UE support                            Cognition Arousal/Alertness: Awake/alert Behavior During Therapy: Flat affect Overall Cognitive Status: Within Functional Limits for tasks assessed                                 General Comments: Self limiting         General Comments General comments (skin integrity, edema, etc.): VSS on RA      Pertinent Vitals/Pain Pain Assessment: No/denies pain           PT Goals (current goals can now be found in the care plan section) Acute Rehab PT Goals Patient Stated Goal: to get out of the hospital PT Goal Formulation: With patient Time For Goal Achievement: 05/02/20 Potential to Achieve Goals: Fair Progress towards PT goals: Progressing toward goals    Frequency    Min 3X/week      PT Plan Current plan remains appropriate       AM-PAC PT "6 Clicks" Mobility   Outcome Measure  Help needed turning from your back to your side while in a flat bed without using bedrails?:  None Help needed moving from lying on your back to sitting on the side of a flat bed without using bedrails?: None Help needed moving to and from a bed to a chair (including a wheelchair)?: A Little Help needed standing up from a chair using your arms (e.g., wheelchair or bedside chair)?: A Little Help needed to walk in hospital room?: A Little Help needed climbing 3-5 steps with a railing? : A Lot 6 Click Score: 19    End of Session   Activity Tolerance: Patient tolerated treatment  well Patient left: with call bell/phone within reach;in bed Nurse Communication: Mobility status PT Visit Diagnosis: Other abnormalities of gait and mobility (R26.89)     Time: 1610-9604 PT Time Calculation (min) (ACUTE ONLY): 17 min  Charges:  $Therapeutic Exercise: 8-22 mins                     Stori Royse B. [name] PT, DPT Acute Rehabilitation Services Pager [phone] Office [phone]    [name] Fleet 04/19/2020, 5:03 PM

## 2020-04-19 NOTE — TOC Progression Note (Addendum)
Transition of Care [facility]) - Progression Note    Patient Details  Name: Brett Dawson MRN: [id number] Date of Birth: 01/17/1956  [facility]) CM/SW Contact  Zenon Mayo, RN Phone Number: 04/19/2020, 1:47 PM  Clinical Narrative:    Per Junie Panning PA, Daughter is coming this weekend.  Brother will be checking in on him. He will dc with mini express chest tube which will be put in tomorrow and tube feeds for 16 hrs. Friday is the goal for discharge.  For the goal to work patient must be willing to participate with physical therapy and be able to participate in his care.  NCM informed Pam with [employer] of this information. Pam will be doing tube feeding teaching with the patient today and she will touch basis with Cecille Rubin with Remote afterwards.  The RN with remote will not do anything with the chest tube or tube feedings, they will just make sure he is taking his meds, and doing everything right, make sure he has resources, and make sure he can return demonstrate care to the RN, the [facility] with [facility] will be the one taking care of tube feeding and mini chest tube express.   12/15- Per Pam , she states she just finish another teach session for enteral with patient and he did very well. But he has not gotten up yet with physical therapy today.         Expected Discharge Plan and Services                                                 Social Determinants of Health (SDOH) Interventions    Readmission Risk Interventions No flowsheet data found.

## 2020-04-19 NOTE — Progress Notes (Addendum)
      [address]       [place] [address]             [phone]      8 Days Post-Op Procedure(s) (LRB): ESOPHAGOGASTROSCOPY WITH ESOPHAGEAL STENT PLACEMENT (N/A) LEFT VIDEO ASSISTED THORACOSCOPY (VATS) WITH IRRIGATION AND CHEST TUBE PLACEMENT (Left)   Subjective:  Patient ambulated yesterday.  He continues to cough up a lot of fluid, yesterday was bright red due to fruit punch he drank.  Denies N/V  He is requesting we apply for emergency Medicaid/Medicare for him, he states he was told this was possible.  I told patient that unfortunately with him not having help at home, he needs to actively participate with nursing and learn how to take care of his J Tube, tube feedings and chest tube.  He will not be able to be discharged unless he is capable of taking care of these items as he has no family that is able to assist in care.   Objective: Vital signs in last 24 hours: Temp:  [97.9 F (36.6 C)-98.8 F (37.1 C)] 98.6 F (37 C) (12/15 0452) Pulse Rate:  [88-98] 88 (12/15 0452) Cardiac Rhythm: Normal sinus rhythm (12/15 0452) Resp:  [17-20] 20 (12/15 0452) BP: (98-113)/(58-69) 103/59 (12/15 0452) SpO2:  [91 %-93 %] 93 % (12/15 0452) Weight:  [87.9 kg] 87.9 kg (12/15 0452)  Intake/Output from previous day: 12/14 0701 - 12/15 0700 In: 0  Out: 210 [Chest Tube:210]  General appearance: alert, cooperative and no distress Heart: regular rate and rhythm Lungs: clear to auscultation bilaterally Abdomen: soft, non-tender; bowel sounds normal; no masses,  no organomegaly Extremities: extremities normal, atraumatic, no cyanosis or edema Wound: clean and dry  Lab Results: No results for input(s): WBC, HGB, HCT, PLT in the last 72 hours. BMET: No results for input(s): NA, K, CL, CO2, GLUCOSE, BUN, CREATININE, CALCIUM in the last 72 hours.  PT/INR: No results for input(s): LABPROT, INR in the last 72 hours. ABG    Component Value Date/Time   PHART 7.454 (H)  02/25/2020 0500   HCO3 26.5 02/25/2020 0500   TCO2 27 02/24/2020 1944   ACIDBASEDEF 1.0 02/24/2020 1547   O2SAT 93.7 02/25/2020 0500   CBG (last 3)  Recent Labs    04/18/20 1637 04/18/20 2133 04/19/20 0643  GLUCAP 90 128* 131*    Assessment/Plan: S/P Procedure(s) (LRB): ESOPHAGOGASTROSCOPY WITH ESOPHAGEAL STENT PLACEMENT (N/A) LEFT VIDEO ASSISTED THORACOSCOPY (VATS) WITH IRRIGATION AND CHEST TUBE PLACEMENT (Left)  1. CV- CV hemodynamically stable 2. Pulm- pigtail catheter removed yesterday, remaining CT will be converted to Mini Express in the future, 210 cc output yesterday 3. GI- tube feedings are being cycled, down to 16 hrs per day, on clear liquids tolerating so far without difficulty 4. Dispo- patient stable, he is willing to participate and learn how to take care of tube feeds and chest tube once converted.  I have spoke with nursing who will start education today, home health has been arranged, planning for discharge on Friday as long as patient remains stable   LOS: 55 days    Ellwood Handler, PA-C 04/19/2020  More motivated today Home Friday  [place]

## 2020-04-19 NOTE — Progress Notes (Signed)
Occupational Therapy Treatment Patient Details Name: Brett Dawson MRN: [id number] DOB: 29-Jul-1955 Today's Date: 04/19/2020    History of present illness 64 yo male with Esophageal cancer. s/p 02/24/20 robotic assisted Ivor-Lewis esophagectomy with jejunostomy tube placement. Pt with increasing shortness of breath and fever with chest tubes placed x 2 (11/1 and 11/2). Lymphangiogram with thoracic duct embolization on 11.18.21 and 12.3.21.   OT comments  Pt able to dress in sitting with set up, sat on rollator as OT washed, brushed and braided his hair. Pt completed the rest of his grooming with set up. Requires min guard assist with rollator OOB. Pt states he has a walker at home that fits in his small area. Does not want a 3 in 1 or rollator.   Follow Up Recommendations  Home health OT;Supervision/Assistance - 24 hour    Equipment Recommendations  None recommended by OT (pt does not want 3 in 1)    Recommendations for Other Services      Precautions / Restrictions Precautions Precautions: Fall Precaution Comments: Chest tube and G tube       Mobility Bed Mobility Overal bed mobility: Needs Assistance Bed Mobility: Supine to Sit;Sit to Supine     Supine to sit: Supervision Sit to supine: Supervision   General bed mobility comments: Supervision for lines  Transfers Overall transfer level: Needs assistance Equipment used: 4-wheeled walker Transfers: Sit to/from [employer] Transfers Sit to Stand: Min guard Stand pivot transfers: Min guard            Balance Overall balance assessment: Needs assistance   Sitting balance-Leahy Scale: Good       Standing balance-Leahy Scale: Poor Standing balance comment: Reliant on UE support                           ADL either performed or assessed with clinical judgement   ADL Overall ADL's : Needs assistance/impaired     Grooming: Brushing hair;Sitting;Total assistance;Wash/dry hands;Wash/dry face;Oral  care;Set up Grooming Details (indicate cue type and reason): washed pt's hair in sink as he sat on rollator, brushed and braided         Upper Body Dressing : Set up;Sitting Upper Body Dressing Details (indicate cue type and reason): front opening gown Lower Body Dressing: Set up;Sitting/lateral leans Lower Body Dressing Details (indicate cue type and reason): able to reach feet to don socks                     Vision       Perception     Praxis      Cognition Arousal/Alertness: Awake/alert Behavior During Therapy: Flat affect Overall Cognitive Status: Within Functional Limits for tasks assessed                                          Exercises     Shoulder Instructions       General Comments      Pertinent Vitals/ Pain       Pain Assessment: No/denies pain  Home Living                                          Prior Functioning/Environment  Frequency  Min 2X/week        Progress Toward Goals  OT Goals(current goals can now be found in the care plan section)  Progress towards OT goals: Progressing toward goals  Acute Rehab OT Goals Patient Stated Goal: to get out of the hospital OT Goal Formulation: With patient Time For Goal Achievement: 04/27/20 Potential to Achieve Goals: Good  Plan Discharge plan remains appropriate    Co-evaluation                 AM-PAC OT "6 Clicks" Daily Activity     Outcome Measure   Help from another person eating meals?: None Help from another person taking care of personal grooming?: A Lot Help from another person toileting, which includes using toliet, bedpan, or urinal?: A Little Help from another person bathing (including washing, rinsing, drying)?: A Little Help from another person to put on and taking off regular upper body clothing?: A Little Help from another person to put on and taking off regular lower body clothing?: A Little 6 Click Score:  18    End of Session Equipment Utilized During Treatment: Rolling walker  OT Visit Diagnosis: Muscle weakness (generalized) (M62.81)   Activity Tolerance Patient tolerated treatment well   Patient Left in bed;with call bell/phone within reach;with nursing/sitter in room   Nurse Communication          Time: 8811-0315 OT Time Calculation (min): 53 min  Charges: OT General Charges $OT Visit: 1 Visit OT Treatments $Self Care/Home Management : 53-67 mins  Nestor Lewandowsky, OTR/L Acute Rehabilitation Services Pager: [phone] Office: [phone]   Malka So 04/19/2020, 11:05 AM

## 2020-04-19 NOTE — Plan of Care (Signed)
  Problem: Education: Goal: Knowledge of General Education information will improve Description: Including pain rating scale, medication(s)/side effects and non-pharmacologic comfort measures 04/19/2020 2240 by Lubertha South, RN Outcome: Progressing  Problem: Health Behavior/Discharge Planning: Goal: Ability to manage health-related needs will improve 04/19/2020 2240 by Lubertha South, RN Outcome: Progressing  Problem: Clinical Measurements: Goal: Respiratory complications will improve 04/19/2020 2240 by Lubertha South, RN Outcome: Progressing  Problem: Activity: Goal: Risk for activity intolerance will decrease 04/19/2020 2240 by Lubertha South, RN Outcome: Progressing   Problem: Nutrition: Goal: Adequate nutrition will be maintained 04/19/2020 2240 by Lubertha South, RN Outcome: Progressing  Problem: Pain Managment: Goal: General experience of comfort will improve 04/19/2020 2240 by Lubertha South, RN Outcome: Progressing  Problem: Skin Integrity: Goal: Risk for impaired skin integrity will decrease 04/19/2020 2240 by Lubertha South, RN Outcome: Progressing

## 2020-04-20 LAB — GLUCOSE, CAPILLARY
Glucose-Capillary: 115 mg/dL — ABNORMAL HIGH (ref 70–99)
Glucose-Capillary: 85 mg/dL (ref 70–99)
Glucose-Capillary: 88 mg/dL (ref 70–99)
Glucose-Capillary: 93 mg/dL (ref 70–99)

## 2020-04-20 NOTE — Progress Notes (Signed)
      [address]       [place] [address]             [phone]      9 Days Post-Op Procedure(s) (LRB): ESOPHAGOGASTROSCOPY WITH ESOPHAGEAL STENT PLACEMENT (N/A) LEFT VIDEO ASSISTED THORACOSCOPY (VATS) WITH IRRIGATION AND CHEST TUBE PLACEMENT (Left)   Subjective:  No new complaints.  Worked well with PT yesterday.. Teaching with nurse for tube feedings also went well.  Objective: Vital signs in last 24 hours: Temp:  [98.6 F (37 C)-99.4 F (37.4 C)] 99.4 F (37.4 C) (12/16 0354) Pulse Rate:  [81-95] 95 (12/16 0354) Cardiac Rhythm: Normal sinus rhythm (12/16 0354) Resp:  [16-20] 19 (12/16 0354) BP: (96-126)/(62-80) 105/62 (12/16 0354) SpO2:  [93 %-95 %] 94 % (12/16 0354) Weight:  [86.5 kg] 86.5 kg (12/16 0354)  Intake/Output from previous day: 12/15 0701 - 12/16 0700 In: 360 [P.O.:360] Out: 1315 [Urine:1225; Chest Tube:90]  General appearance: alert, cooperative and no distress Heart: regular rate and rhythm Lungs: clear to auscultation bilaterally Abdomen: soft, non-tender; bowel sounds normal; no masses,  no organomegaly Extremities: extremities normal, atraumatic, no cyanosis or edema Wound: clean and dry  Lab Results: No results for input(s): WBC, HGB, HCT, PLT in the last 72 hours. BMET: No results for input(s): NA, K, CL, CO2, GLUCOSE, BUN, CREATININE, CALCIUM in the last 72 hours.  PT/INR: No results for input(s): LABPROT, INR in the last 72 hours. ABG    Component Value Date/Time   PHART 7.454 (H) 02/25/2020 0500   HCO3 26.5 02/25/2020 0500   TCO2 27 02/24/2020 1944   ACIDBASEDEF 1.0 02/24/2020 1547   O2SAT 93.7 02/25/2020 0500   CBG (last 3)  Recent Labs    04/19/20 1627 04/19/20 2108 04/20/20 0617  GLUCAP 88 114* 115*    Assessment/Plan: S/P Procedure(s) (LRB): ESOPHAGOGASTROSCOPY WITH ESOPHAGEAL STENT PLACEMENT (N/A) LEFT VIDEO ASSISTED THORACOSCOPY (VATS) WITH IRRIGATION AND CHEST TUBE PLACEMENT (Left)  1. CV-  hemodynamically stable 2. Pulm- CT output 90 cc yesterday, remains milky.. will convert to [facility] today, and start teaching 3. GI- continue tube feedings at 16 hours, will likely cycle at follow up visit, continue clear liquid diet 4. Dispo- patient stable, did well yesterday with PT and teachings for tube feeds, will convert to [facility] today, spoke with nurse to help teach patient how to drain pleurovac, home arrangements have been made, patient is happy to be getting to go home.   LOS: 56 days    Ellwood Handler, PA-C 04/20/2020

## 2020-04-20 NOTE — Progress Notes (Addendum)
Physical Therapy Treatment Patient Details Name: Brett Dawson MRN: [id number] DOB: 09-12-1955 Today's Date: 04/20/2020    History of Present Illness 64 yo male with Esophageal cancer. s/p 02/24/20 robotic assisted Ivor-Lewis esophagectomy with jejunostomy tube placement. Pt with increasing shortness of breath and fever with chest tubes placed x 2 (11/1 and 11/2). Lymphangiogram with thoracic duct embolization on 11.18.21 and 12.3.21.    PT Comments    Pt is understandably very angry today, because RN told him that a home chest tube canister can not be sourced due to supply shortages. Pt also angry because he feels that doctors are not telling him everything. Agreement to therapy, only because he has been told that he needs to participate to be able to qualify for home services. Pt with decreased concern for safety. Pt is min guard for bed mobility and transfer, and minA for management of chest tube canister with ambulation due to pt's decreased concern for safety. Pt needs a firm plan for discharge and likely a palliative consult before leaving as it is unclear that he understands the severity of his condition. PT will continue to follow acutely.   Follow Up Recommendations  Home health PT;Supervision/Assistance - 24 hour     Equipment Recommendations  3in1 (PT)       Precautions / Restrictions Precautions Precautions: Fall Precaution Comments: Chest tube and G tube (nighttime feeds) Restrictions Weight Bearing Restrictions: No    Mobility  Bed Mobility Overal bed mobility: Needs Assistance Bed Mobility: Supine to Sit;Sit to Supine     Supine to sit: Min guard Sit to supine: Min guard   General bed mobility comments: min guard due to impulsivity with all movement, no awareness of safety  Transfers Overall transfer level: Needs assistance Equipment used: 4-wheeled walker Transfers: Sit to/from Stand Sit to Stand: Min guard         General transfer comment: maximal cuing  for safety  Ambulation/Gait Ambulation/Gait assistance: Min assist Gait Distance (Feet): 100 Feet Assistive device: 4-wheeled walker Gait Pattern/deviations: Step-through pattern Gait velocity: too fast for conditions Gait velocity interpretation: 1.31 - 2.62 ft/sec, indicative of limited community ambulator General Gait Details: minA for managing chest tube and lines due to increased speed       Balance Overall balance assessment: Needs assistance Sitting-balance support: Feet supported;No upper extremity supported Sitting balance-Leahy Scale: Good     Standing balance support: Single extremity supported;During functional activity Standing balance-Leahy Scale: Poor                              Cognition Arousal/Alertness: Awake/alert Behavior During Therapy: Agitated;Impulsive Overall Cognitive Status: Impaired/Different from baseline Area of Impairment: Safety/judgement                         Safety/Judgement: Decreased awareness of safety     General Comments: pt is very angry that a home pleural vac can not be found         General Comments General comments (skin integrity, edema, etc.): VSS on RA      Pertinent Vitals/Pain Pain Assessment: No/denies pain           PT Goals (current goals can now be found in the care plan section) Acute Rehab PT Goals Patient Stated Goal: to get out of the hospital PT Goal Formulation: With patient Time For Goal Achievement: 05/02/20 Potential to Achieve Goals: Fair Progress towards PT goals: Not  progressing toward goals - comment (very angry today self limiting)    Frequency    Min 3X/week      PT Plan Current plan remains appropriate       AM-PAC PT "6 Clicks" Mobility   Outcome Measure  Help needed turning from your back to your side while in a flat bed without using bedrails?: None Help needed moving from lying on your back to sitting on the side of a flat bed without using  bedrails?: None Help needed moving to and from a bed to a chair (including a wheelchair)?: A Little Help needed standing up from a chair using your arms (e.g., wheelchair or bedside chair)?: A Little Help needed to walk in hospital room?: A Little Help needed climbing 3-5 steps with a railing? : A Lot 6 Click Score: 19    End of Session Equipment Utilized During Treatment: Oxygen Activity Tolerance: Patient tolerated treatment well Patient left: with call bell/phone within reach;in bed Nurse Communication: Mobility status PT Visit Diagnosis: Other abnormalities of gait and mobility (R26.89)     Time: 6073-7106 PT Time Calculation (min) (ACUTE ONLY): 11 min  Charges:  $Therapeutic Exercise: 8-22 mins                     [name] [name] PT, DPT Acute Rehabilitation Services Pager [phone] Office [phone]    [name] 04/20/2020, 4:33 PM

## 2020-04-20 NOTE — Progress Notes (Signed)
Occupational Therapy Treatment Patient Details Name: Brett Dawson MRN: [id number] DOB: 04-02-1956 Today's Date: 04/20/2020    History of present illness 64 yo male with Esophageal cancer. s/p 02/24/20 robotic assisted Ivor-Lewis esophagectomy with jejunostomy tube placement. Pt with increasing shortness of breath and fever with chest tubes placed x 2 (11/1 and 11/2). Lymphangiogram with thoracic duct embolization on 11.18.21 and 12.3.21.   OT comments  Pt stating he may not be able to discharge tomorrow as planned due to equipment issues. Pt disappointed. Wants to return home with pleurx chest tube. Continues to move well with RW and assist for lines for toileting and standing grooming.   Follow Up Recommendations  Home health OT;Supervision/Assistance - 24 hour    Equipment Recommendations  None recommended by OT (pt declines any DME)    Recommendations for Other Services      Precautions / Restrictions Precautions Precautions: Fall Precaution Comments: Chest tube and G tube (nighttime feeds)       Mobility Bed Mobility Overal bed mobility: Needs Assistance Bed Mobility: Supine to Sit;Sit to Supine     Supine to sit: Supervision Sit to supine: Supervision   General bed mobility comments: for lines  Transfers Overall transfer level: Needs assistance Equipment used: 4-wheeled walker Transfers: Sit to/from Stand Sit to Stand: Min guard         General transfer comment: cues for brake use with rollator    Balance Overall balance assessment: Needs assistance   Sitting balance-Leahy Scale: Good       Standing balance-Leahy Scale: Poor                             ADL either performed or assessed with clinical judgement   ADL Overall ADL's : Needs assistance/impaired     Grooming: Wash/dry hands;Standing;Min guard                   Toilet Transfer: Min guard;Ambulation;RW;BSC   Toileting- Water quality scientist and Hygiene: Min guard;Sit  to/from stand       Functional mobility during ADLs: Passenger transport manager     Praxis      Cognition Arousal/Alertness: Awake/alert Behavior During Therapy: Flat affect Overall Cognitive Status: Within Functional Limits for tasks assessed                                 General Comments: pt disappointed he may not discharge Friday        Exercises     Shoulder Instructions       General Comments      Pertinent Vitals/ Pain       Pain Assessment: No/denies pain  Home Living                                          Prior Functioning/Environment              Frequency  Min 2X/week        Progress Toward Goals  OT Goals(current goals can now be found in the care plan section)  Progress towards OT goals: Progressing toward goals  Acute Rehab OT Goals Patient Stated Goal: to get out of the hospital OT Goal Formulation: With  patient Time For Goal Achievement: 04/27/20 Potential to Achieve Goals: Good  Plan Discharge plan remains appropriate    Co-evaluation                 AM-PAC OT "6 Clicks" Daily Activity     Outcome Measure   Help from another person eating meals?: None Help from another person taking care of personal grooming?: A Little Help from another person toileting, which includes using toliet, bedpan, or urinal?: A Little Help from another person bathing (including washing, rinsing, drying)?: A Little Help from another person to put on and taking off regular upper body clothing?: None Help from another person to put on and taking off regular lower body clothing?: A Little 6 Click Score: 20    End of Session Equipment Utilized During Treatment: Rolling walker  OT Visit Diagnosis: Muscle weakness (generalized) (M62.81)   Activity Tolerance Patient tolerated treatment well   Patient Left in bed;with call bell/phone within reach   Nurse Communication           Time: 6948-5462 OT Time Calculation (min): 17 min  Charges: OT General Charges $OT Visit: 1 Visit OT Treatments $Self Care/Home Management : 8-22 mins  Nestor Lewandowsky, OTR/L Acute Rehabilitation Services Pager: [phone] Office: [phone]   Malka So 04/20/2020, 2:29 PM

## 2020-04-21 ENCOUNTER — Ambulatory Visit: Payer: 59 | Admitting: Thoracic Surgery (Cardiothoracic Vascular Surgery)

## 2020-04-21 ENCOUNTER — Other Ambulatory Visit (HOSPITAL_COMMUNITY): Payer: Self-pay | Admitting: Physician Assistant

## 2020-04-21 LAB — GLUCOSE, CAPILLARY: Glucose-Capillary: 121 mg/dL — ABNORMAL HIGH (ref 70–99)

## 2020-04-21 MED ORDER — ACETAMINOPHEN 160 MG/5ML PO SOLN: 650.0000 mg | ORAL | 0 refills | Status: DC | PRN

## 2020-04-21 MED ORDER — FREE WATER: 75.0000 mL | 12 refills | Status: DC

## 2020-04-21 MED ORDER — OMEPRAZOLE 20 MG PO CPDR: DELAYED_RELEASE_CAPSULE | ORAL | 3 refills | Status: DC

## 2020-04-21 MED ORDER — PANTOPRAZOLE SODIUM 40 MG PO PACK: 40.0000 mg | PACK | Freq: Two times a day (BID) | ORAL | 3 refills | Status: DC

## 2020-04-21 MED ORDER — GUAIFENESIN 100 MG/5ML PO SOLN: 15.0000 mL | ORAL | 3 refills | Status: DC | PRN

## 2020-04-21 MED ORDER — OSMOLITE 1.5 CAL PO LIQD: 1760.0000 mL | ORAL | Status: DC

## 2020-04-21 MED ORDER — PROSOURCE TF PO LIQD: 45.0000 mL | Freq: Three times a day (TID) | ORAL | 6 refills | Status: DC

## 2020-04-21 MED ORDER — FLUTICASONE PROPIONATE 50 MCG/ACT NA SUSP: 2.0000 | Freq: Every day | NASAL | 2 refills | Status: DC

## 2020-04-21 MED FILL — FLUTICASONE PROP 50 MCG SPR: 50 | 30 days supply | Qty: 16 | Fill #0

## 2020-04-21 MED FILL — SM TUSSIN MUCUS-CONG 200 MG: 100 | 3 days supply | Qty: 236 | Fill #0

## 2020-04-21 MED FILL — OMEPRAZOLE 40 MG CPDR: 40 | 30 days supply | Qty: 60 | Fill #0

## 2020-04-21 NOTE — Progress Notes (Signed)
Pt refused vitals at this time. Pt stated " she should have came before midnight." Pt was referring to the NT and she was in the pts room before midnight to get vitals. Will continue to monitor.

## 2020-04-21 NOTE — TOC Transition Note (Signed)
Transition of Care [facility]) - CM/SW Discharge Note   Patient Details  Name: BRALLAN DENIO MRN: [id number] Date of Birth: 1955/10/31  Transition of Care [facility]) CM/SW Contact:  Zenon Mayo, RN Phone Number: 04/21/2020, 11:42 AM   Clinical Narrative:    Patient is for dc today, NCM notified Ramond Marrow with [facility], Pam with [facility] and Lori with [facility], patient has a JP drain bulb, that he knows how to care for per [facility].  He also knows how to do his tube feeds per Pam with [facility]. Per Staff RN he feels good about everything.     Final next level of care: Home w Home Health Services Barriers to Discharge: No Barriers Identified   Patient Goals and CMS Choice   CMS Medicare.gov Compare Post Acute Care list provided to:: Patient Choice offered to / list presented to : Patient  Discharge Placement                       Discharge Plan and Services                  DME Agency: NA       HH Arranged: RN [facility] Agency: [place] ([facility]) ([facility] , [facility]) Date [place] Agency Contacted: 04/21/20 Time [place]: 1142 Representative spoke with at [place]: Evalina Field  Social Determinants of Health ([place]) Interventions     Readmission Risk Interventions No flowsheet data found.

## 2020-04-21 NOTE — Progress Notes (Signed)
   04/21/20 0745  Assess: MEWS Score  Temp 98.6 F (37 C)  BP 95/72  Pulse Rate 88  ECG Heart Rate 88  Resp (!) 22  Level of Consciousness Alert  SpO2 94 %  O2 Device Room Air  Patient Activity (if Appropriate) In bed  Assess: MEWS Score  MEWS Temp 0  MEWS Systolic 1  MEWS Pulse 0  MEWS RR 1  MEWS LOC 0  MEWS Score 2  MEWS Score Color Yellow  Assess: if the MEWS score is Yellow or Red  Were vital signs taken at a resting state? Yes  Focused Assessment No change from prior assessment  Early Detection of Sepsis Score *See Row Information* Low  MEWS guidelines implemented *See Row Information* No, previously yellow, continue vital signs every 4 hours  Treat  Pain Scale 0-10  Pain Score 0  Notify: Charge Nurse/RN  Name of Charge Nurse/RN Notified Marita [place] J  Date Charge Nurse/RN Notified 04/21/20  Time Charge Nurse/RN Notified [phone]

## 2020-04-21 NOTE — Discharge Instructions (Addendum)
CLEAR LIQUID DIET ONLY... DO NOT ADVANCE DIET  EMPTY JP DRAIN AS NEEDED, RECORD OUTPUT   [facility] office [phone]    Esophagectomy, Care After This sheet gives you information about how to care for yourself after your procedure. Your health care provider may also give you more specific instructions. If you have problems or questions, contact your health care provider. What can I expect after the procedure? After the procedure, it is common to have:  Soreness near the incision sites.  Soreness when swallowing food.  Tiredness (fatigue).  Loss of appetite.  Nausea.  Heartburn.  Diarrhea. Follow these instructions at home: Feeding tube  If you were sent home with a feeding tube, follow instructions from your health care provider about taking care of the tube.  Work with your nutrition care provider for using your feeding tube and getting enough nutrition. Eating and drinking  Follow instructions from your health care provider about eating or drinking restrictions. You may need to eat smaller and more frequent meals.  Drink enough fluid to keep your urine clear or pale yellow. Activity  Do not lift anything that is heavier than 10 lb (4.5 kg) until your surgeon says you can.  Do not drive or operate heavy machinery while taking prescription pain medicine.  Return to your normal activities as told by your health care provider. Ask your health care provider what activities are safe for you. Incision care   Follow instructions from your health care provider about how to take care of your incision. Make sure you: ? Change your bandage (dressing) as told by your health care provider. ? Wash your hands with soap and water before you change your dressing. If soap and water are not available, use hand sanitizer. ? Leave stitches (sutures), skin glue, or adhesive strips in place. These skin closures may need to stay in place for 2 weeks or longer. If adhesive strip edges start  to loosen and curl up, you may trim the loose edges. Do not remove adhesive strips completely unless your health care provider tells you to do that.  Do not take baths, swim, or use a hot tub until your health care provider approves  Check your incision areas every day for signs of infection. Check for: ? More redness, swelling, or pain. ? More fluid or blood. ? Warmth. ? Pus or a bad smell. General instructions  To prevent or treat constipation while you are taking prescription pain medicine, your health care provider may recommend that you: ? Take over-the-counter or prescription medicines. ? Eat foods that are high in fiber, such as fresh fruits and vegetables, whole grains, and beans. ? Limit foods that are high in fat and processed sugars, such as fried and sweet foods.  Take over-the-counter and prescription medicines only as told by your health care provider.  Wear compression stockings as told by your health care provider. These stockings help prevent blood clots and reduce swelling in your legs.  Do not use any products that contain nicotine or tobacco, such as cigarettes and e-cigarettes. If you need help quitting, ask your health care provider.  Keep all follow-up visits as told by your health care provider. This is important. Contact a health care provider if:  You have problems or questions about your feeding tube.  You have chills or fever.  You have pain that is not controlled by your pain medicine.  You have trouble swallowing.  You have persistent or worsening heartburn.  You have persistent  or worsening diarrhea.  You have more redness, swelling, or pain around your incision area.  You have more fluid coming from your incision area.  Your incision feels warm to the touch.  You have pus or a bad smell coming from your incision area.  You have a persistent or worsening cough. Get help right away if:  You have severe pain.  You are bleeding from an  incision area.  You are vomiting blood.  You are unable to swallow.  You have trouble breathing.  You have chest pain. Summary  Expect some discomfort and fatigue during your recovery.  Work with your nutrition care provider if you still have a feeding tube at home.  Follow your surgeon's directions for care of your incision areas.  Return to normal activities gradually, as told by your health care provider. This information is not intended to replace advice given to you by your health care provider. Make sure you discuss any questions you have with your health care provider. Document Revised: 04/04/2017 Document Reviewed: 12/21/2015 Elsevier Patient Education  2020 [place] [facility]acic Surgery, Care After This sheet gives you information about how to care for yourself after your procedure. Your health care provider may also give you more specific instructions. If you have problems or questions, contact your health care provider. What can I expect after the procedure? After the procedure, it is common to have:  Some pain and aches in the area of your surgical cuts (incisions).  Pain when breathing in (inhaling) and coughing.  Tiredness (fatigue).  Trouble sleeping.  Constipation. Follow these instructions at home: Medicines  Take over-the-counter and prescription medicines only as told by your health care provider.  If you were prescribed an antibiotic medicine, take it as told by your health care provider. Do not stop taking the antibiotic even if you start to feel better.  Talk with your health care provider about safe and effective ways to manage pain after your procedure. Pain management should fit your specific health needs.  Take prescription pain medicine before pain becomes severe. Relieving and controlling your pain will make breathing easier for you. Activity  Return to your normal activities as told by your health care provider. Ask your  health care provider what activities are safe for you.  Do not lift anything that is heavier than 10 lb (4.5 kg), or the limit that you are told, until your health care provider says that it is safe.  Avoid sitting for a long time without moving. Get up and move around one or more times every few hours. Bathing  Do not take baths, swim, or use a hot tub until your health care provider approves. You may take showers. Incision care  Follow instructions from your health care provider about how to take care of your incision(s). Make sure you: ? Wash your hands with soap and water before you change your bandage (dressing). If soap and water are not available, use hand sanitizer. ? Change your dressing as told by your health care provider. ? Leave stitches (sutures), skin glue, or adhesive strips in place. These skin closures may need to stay in place for 2 weeks or longer. If adhesive strip edges start to loosen and curl up, you may trim the loose edges. Do not remove adhesive strips completely unless your health care provider tells you to do that.  Check your incision area every day for signs of infection. Check for: ? Redness, swelling, or pain. ? Fluid or  blood. ? Warmth. ? Pus or a bad smell. Driving  Ask your health care provider when it is safe for you to drive.  Do not drive or use heavy machinery while taking prescription pain medicine. Eating and drinking  Follow instructions from your health care provider about eating or drinking restrictions. These will vary depending on what procedure you had. Your health care provider may recommend: ? A liquid diet or soft diet for the first few days. ? Meals that are smaller and more frequent. ? A diet of fruits, vegetables, whole grains, and low-fat proteins. ? Limiting foods that are high in processed sugar and fat, including fried and sweet foods. Pneumonia prevention   Do not use any products that contain nicotine or tobacco, such as  cigarettes and e-cigarettes. If you need help quitting, ask your health care provider.  Avoid secondhand smoke.  Do deep breathing exercises and cough regularly as directed. This helps to clear mucus and prevent pneumonia. If it hurts to cough, try one of these methods to ease your pain when you cough: ? Hold a pillow against your chest. ? Place the palms of both hands over your incisions (use splinting).  Use an incentive spirometer as directed. This device measures how much air your lungs are getting with each breath. Using this will improve your breathing.  Do pulmonary rehabilitation as directed. This is a program that includes exercise, education, and support. General instructions  Wear compression stockings as told by your health care provider. These stockings help to prevent blood clots and reduce swelling in your legs.  If you have a drainage tube: ? Follow instructions from your health care provider about how to take care of it. ? Do not travel by airplane after your tube is removed until your health care provider tells you it is safe.  To prevent or treat constipation while you are taking prescription pain medicine, your health care provider may recommend that you: ? Drink enough fluid to keep your urine pale yellow. ? Take over-the-counter or prescription medicines. ? Eat foods that are high in fiber, such as fresh fruits and vegetables, whole grains, and beans. ? Limit foods that are high in fat and processed sugars, such as fried and sweet foods.  Keep all follow-up visits as told by your health care provider. This is important. Contact a health care provider if:  You have redness, swelling, or pain around an incision.  You have fluid or blood coming from an incision.  An incision feels warm to the touch.  You have pus or a bad smell coming from an incision.  You have a fever.  You cannot eat or drink without vomiting.  Your prescription pain medicine is not  controlling your pain. Get help right away if:  You have chest pain.  Your heart is beating quickly.  You have trouble breathing.  You have trouble speaking.  You are confused.  You feel weak or dizzy, or you faint. These symptoms may represent a serious problem that is an emergency. Do not wait to see if the symptoms will go away. Get medical help right away. Call your local emergency services (911 in the [place] Do not drive yourself to the hospital. Summary  Talk with your health care provider about safe and effective ways to manage pain after your procedure. Pain management should fit your specific health needs.  Return to your normal activities as told by your health care provider. Ask your health care provider what activities  are safe for you.  Do deep breathing exercises and cough regularly as directed. This helps to clear mucus and prevent pneumonia. If it hurts to cough, ease pain by holding a pillow against your chest or by placing the palms of both hands over your incisions (splinting). This information is not intended to replace advice given to you by your health care provider. Make sure you discuss any questions you have with your health care provider. Document Revised: 02/19/2019 Document Reviewed: 08/26/2016 Elsevier Patient Education  [place].

## 2020-04-21 NOTE — Progress Notes (Signed)
      [address]       [place] [address]             [phone]      10 Days Post-Op Procedure(s) (LRB): ESOPHAGOGASTROSCOPY WITH ESOPHAGEAL STENT PLACEMENT (N/A) LEFT VIDEO ASSISTED THORACOSCOPY (VATS) WITH IRRIGATION AND CHEST TUBE PLACEMENT (Left)   Subjective:  No new complaints.  Was irritated thinking he wasn't going to get discharged today.    Objective: Vital signs in last 24 hours: Temp:  [98.5 F (36.9 C)-99.1 F (37.3 C)] 98.5 F (36.9 C) (12/17 0446) Pulse Rate:  [78-87] 83 (12/17 0446) Cardiac Rhythm: Normal sinus rhythm (12/16 1900) Resp:  [15-20] 20 (12/17 0446) BP: (102-125)/(64-68) 102/64 (12/17 0446) SpO2:  [91 %-95 %] 94 % (12/17 0446) Weight:  [85.3 kg] 85.3 kg (12/17 0620)  Intake/Output from previous day: 12/16 0701 - 12/17 0700 In: -  Out: 9191 [Urine:1375; Chest Tube:30]  General appearance: alert, cooperative and no distress Heart: regular rate and rhythm Lungs: clear to auscultation bilaterally Abdomen: soft, non-tender; bowel sounds normal; no masses,  no organomegaly Extremities: extremities normal, atraumatic, no cyanosis or edema Wound: clean and dry  Lab Results: No results for input(s): WBC, HGB, HCT, PLT in the last 72 hours. BMET: No results for input(s): NA, K, CL, CO2, GLUCOSE, BUN, CREATININE, CALCIUM in the last 72 hours.  PT/INR: No results for input(s): LABPROT, INR in the last 72 hours. ABG    Component Value Date/Time   PHART 7.454 (H) 02/25/2020 0500   HCO3 26.5 02/25/2020 0500   TCO2 27 02/24/2020 1944   ACIDBASEDEF 1.0 02/24/2020 1547   O2SAT 93.7 02/25/2020 0500   CBG (last 3)  Recent Labs    04/20/20 1627 04/20/20 2118 04/21/20 0618  GLUCAP 88 93 121*    Assessment/Plan: S/P Procedure(s) (LRB): ESOPHAGOGASTROSCOPY WITH ESOPHAGEAL STENT PLACEMENT (N/A) LEFT VIDEO ASSISTED THORACOSCOPY (VATS) WITH IRRIGATION AND CHEST TUBE PLACEMENT (Left)  1. CV- hemodynamically stable 2. Pulm- CT  30 cc output yesterday- we have no [facility] available.. will plan to covert CT to bulb suction, output remains purulent 3. GI- continue clear liquid diet, continue tube feeds, diet will be advanced on at outpatient basis as Dr. Kipp Brood feels appropriate 4. Dispo- patient stable, continue current care, CT will be converted to bulb suction, will d/c home today, h/h arrangements have been made   LOS: 57 days    Ellwood Handler, PA-C 04/21/2020

## 2020-04-21 NOTE — Plan of Care (Signed)
  Problem: Education: Goal: Knowledge of General Education information will improve Description: Including pain rating scale, medication(s)/side effects and non-pharmacologic comfort measures Outcome: Adequate for Discharge   Problem: Health Behavior/Discharge Planning: Goal: Ability to manage health-related needs will improve Outcome: Adequate for Discharge   Problem: Health Behavior/Discharge Planning: Goal: Ability to manage health-related needs will improve Outcome: Adequate for Discharge   Problem: Clinical Measurements: Goal: Ability to maintain clinical measurements within normal limits will improve Outcome: Adequate for Discharge Goal: Will remain free from infection Outcome: Adequate for Discharge Goal: Diagnostic test results will improve Outcome: Adequate for Discharge Goal: Respiratory complications will improve Outcome: Adequate for Discharge Goal: Cardiovascular complication will be avoided Outcome: Adequate for Discharge   Problem: Activity: Goal: Risk for activity intolerance will decrease Outcome: Adequate for Discharge   Problem: Nutrition: Goal: Adequate nutrition will be maintained Outcome: Adequate for Discharge   Problem: Coping: Goal: Level of anxiety will decrease Outcome: Adequate for Discharge   Problem: Elimination: Goal: Will not experience complications related to bowel motility Outcome: Adequate for Discharge Goal: Will not experience complications related to urinary retention Outcome: Adequate for Discharge   Problem: Pain Managment: Goal: General experience of comfort will improve Outcome: Adequate for Discharge   Problem: Safety: Goal: Ability to remain free from injury will improve Outcome: Adequate for Discharge   Problem: Skin Integrity: Goal: Risk for impaired skin integrity will decrease Outcome: Adequate for Discharge   Problem: Malnutrition  (NI-5.2) Goal: Food and/or nutrient delivery Description: Individualized  approach for food/nutrient provision. Outcome: Adequate for Discharge   Problem: Acute Rehab PT Goals(only PT should resolve) Goal: Patient Will Transfer Sit To/From Stand Outcome: Adequate for Discharge Goal: Pt Will Ambulate Outcome: Adequate for Discharge Goal: Pt Will Go Up/Down Stairs Outcome: Adequate for Discharge   Problem: Acute Rehab OT Goals (only OT should resolve) Goal: Pt. Will Transfer To Toilet Outcome: Adequate for Discharge Goal: Pt. Will Perform Tub/Shower Transfer Outcome: Adequate for Discharge Goal: OT Additional ADL Goal #1 Outcome: Adequate for Discharge   Problem: Acute Rehab OT Goals (only OT should resolve) Goal: Pt/Caregiver Will Perform Home Exercise Program Outcome: Adequate for Discharge

## 2020-04-21 NOTE — Progress Notes (Signed)
Patient GTube feeding discontinued, flushed and clamped.  Discharge instructions reviewed and patient verbalized understanding.  Patient aware of follow up appointments and care of chest tube and G tube.  Patient via wheelchair to friend's waiting care in stable condition

## 2020-04-21 NOTE — Plan of Care (Signed)

## 2020-04-25 ENCOUNTER — Telehealth: Payer: Self-pay | Admitting: Hematology

## 2020-04-25 NOTE — Telephone Encounter (Signed)
Scheduled appt per 12/20 sch msg - pt is aware of appt date and time   

## 2020-05-02 ENCOUNTER — Other Ambulatory Visit: Payer: Self-pay

## 2020-05-02 ENCOUNTER — Ambulatory Visit (INDEPENDENT_AMBULATORY_CARE_PROVIDER_SITE_OTHER): Payer: Self-pay | Admitting: Thoracic Surgery (Cardiothoracic Vascular Surgery)

## 2020-05-02 ENCOUNTER — Encounter: Payer: Self-pay | Admitting: Thoracic Surgery (Cardiothoracic Vascular Surgery)

## 2020-05-02 VITALS — BP 111/78 | HR 105 | Temp 98.4°F | Resp 20 | Ht 71.0 in | Wt 181.0 lb

## 2020-05-02 DIAGNOSIS — C159 Malignant neoplasm of esophagus, unspecified: Secondary | ICD-10-CM

## 2020-05-02 DIAGNOSIS — Z09 Encounter for follow-up examination after completed treatment for conditions other than malignant neoplasm: Secondary | ICD-10-CM

## 2020-05-02 NOTE — Progress Notes (Signed)
[address]       [place] [address]             [phone]        JAMORRIS ORTON [facility] Medical Record #[id number] Date of Birth: Jan 29, 1956  Referring: Malachy Mood, MD Primary Care: Deatra James, MD Primary Cardiologist:No primary care provider on file.  Reason for visit:   follow-up  History of Present Illness:     Mr. Matczak comes in for his first follow-up appointment.  Overall he is doing well at home.  He is tolerating clear liquids, and his J-tube feeds nocturnally.  He does continue to have some brownish discharge from his chest tube but it produces less than 50 mils daily.  Physical Exam: BP 111/78   Pulse (!) 105   Temp 98.4 F (36.9 C) (Skin)   Resp 20   Ht 5\' 11"  (1.803 m)   Wt 181 lb (82.1 kg)   SpO2 93% Comment: RA  BMI 25.24 kg/m   Alert NAD Incision clean, chest tube has light brown thick fluid.  With approximately 25cc within the bulb.  His J-tube is in place. Abdomen soft, ND No peripheral edema   Diagnostic Studies & Laboratory data:  Path:  FINAL MICROSCOPIC DIAGNOSIS:   A. LYMPH NODE, CELIAC, EXCISION:  - Focal atypia.  - See comment.   B. LYMPH NODE, LEVEL 4, EXCISION:  - One benign lymph node (0/1).   C. ESOP HAGUS, ESOPHAGECTOMY:  - Ulcer with inflammation, fibrosis and dystrophic calcifications.  - Focal residual keratin with giant cell reaction.  - No residual viable carcinoma identified.  - Margins free of tumor.  - Thirteen benign lymph nodes (0/13).  - See oncology table.   D. ADDITIONAL DISTAL MARGIN, EXCISION:  - Benign stomach.  - No evidence of malignancy.   COMMENT:  A. The celiac lymph node specimen consists of blood and connective  tissue with no lymph node tissue identified. There are several minute  fragments of epithelioid cells with mild atypia which are not sufficient  for diagnosis of malignancy.   ONCOLOGY TABLE:  ESOPHAGUS:  Procedure: Esophagogastrectomy.  Tumor Site:  Gastroesophageal junction. See previous GE junction biopsy  report ([id number]).  Relationship of Tumor to Esophagogastric Junction: Ulcer is situated at  the gastroesophageal junction.  Tumor Size: No residual viable tumor in resection specimen. See  comment.  Histologic Type: Squamous cell carcinoma, see [id number].  Histologic Grade: [id number].  Tumor Extension: Fibrosis and calcifications limited to wall of  esophagus. No residual viable carcinoma.  Margins: Not involved by tumor.  Treatment Effect: Presurgical neoadjuvant therapy.  Lymphovascular Invasion: Not identified.  Regional Lymph Nodes:    Number of Lymph Nodes Involved: 0    Number of Lymph Nodes Examined: 14  Pathologic Stage Classification (pTNM, AJCC 8th Edition): ypT0, ypN0     Assessment / Plan:   64 year old male with history of squamous cell carcinoma of the esophagus, status post robotic assisted Ivor Lewis esophagectomy.  He had a prolonged hospitalization due to complications from an anastomotic leak as well as a thoracic duct leak.  He underwent esophageal stent placement on 04/11/2020 and has had marked improvement of his chest tube output.  He is back on a clear liquid diet tolerating tube feeds nocturnally.  He continues have a chest tube in place with minimal drainage.  I will plan to remove his esophageal stent in the next month.  I will see him back in  clinic in 2 weeks to evaluate his nutritional status.  I will keep the chest tube in until he is back on a regular diet to ensure that he has no ongoing leak.  Additionally following removal of esophageal stent I will send him down to radiology for thorough swallow study.   Corliss Skains 05/02/2020 3:42 PM

## 2020-05-04 ENCOUNTER — Other Ambulatory Visit: Payer: Self-pay | Admitting: *Deleted

## 2020-05-04 NOTE — Patient Outreach (Signed)
[facility]) Care Management  05/04/2020  Brett Dawson [birth year] [id number]   [facility] outreach for complex care patient after nurse call center outreach [name] was [facility] to [facility] on 11/24/19 Referral Source:[facility] Insurance Referral Reason:esophageal tumor with invasion into cradia  Insurance:[facility] Admissions x1in last 6 months admission for robotic assisted Ivor-Lewis esophagectomy with jejunostomy tube placement by Dr Cliffton Asters- 02/24/20 - discharged on 04/25/20 home with advanced home health infusions, remote health services - J tube in place  11/23/19 EGD with upper endoscopy Dr Dulce Sellar 7/16/21AnotherFeraheme dosegiven Admission 09/17/19 to 09/19/19 syncopehgb 6.7 given blood+ feraheme on 09/18/19 dxsystemic iron deficiency anemia observed and Hgb increased to 7.7 followed by [facility]ology  [facility] Unsuccessful outreach   Outreach attempt to the home number [phone] No answer. [facility] RN CM was not able to leave a HIPAA [facility] Portability and Accountability Act) compliant voicemail message along with CM's contact info.  No voice mail set up    Plan: [facility] RN CM scheduled this patient for another call attempt within 7-14 business days  Lindaann Gradilla L. Noelle Penner, RN, BSN, CCM [facility] Telephonic Care Management Care Coordinator Office number [phone] Mobile number [phone]  Main [facility] number [phone] Fax number [phone]

## 2020-05-08 ENCOUNTER — Other Ambulatory Visit: Payer: Self-pay | Admitting: *Deleted

## 2020-05-08 NOTE — Patient Outreach (Signed)
[facility]) Care Management  05/08/2020  Brett Dawson 03-Dec-1955 [id number]  [facility] call attempt  Mr Persons left a voice message [facility] RN CM returned his cal [facility] Unsuccessful outreach   Outreach attempt to the home number  No answer. [facility] RN CM left HIPAA [facility] Portability and Accountability Act) compliant voicemail message along with CM's contact info.   Plan: [facility] RN CM scheduled this patient for another call attempt within 30 business days  [name] L. Noelle Penner, RN, BSN, CCM [facility] Telephonic Care Management Care Coordinator Office number [phone] Mobile number [phone]  Main [facility] number [phone] Fax number [phone]

## 2020-05-08 NOTE — Progress Notes (Signed)
[place]   [phone] [phone]   Clinic Follow up Note   Patient Care Team: [name], MD as PCP - General (Family Medicine) [name], MD as Consulting Physician (Oncology) Jonnie Finner, RN as Registered Nurse Otis Brace, MD as Consulting Physician (Gastroenterology) Barbaraann Faster, RN as [employer] Karie Mainland, RD as Dietitian (Nutrition) Lajuana Matte, MD as Consulting Physician (Cardiothoracic Surgery) Arta Silence, MD as Consulting Physician (Gastroenterology)  Date of Service:  05/11/2020  CHIEF COMPLAINT: F/u ofEsophageal cancer  SUMMARY OF ONCOLOGIC HISTORY: Oncology History Overview Note  Cancer Staging 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 Large hiatal hernia. 2. Hepatic and bilateral simple renal cysts. 3. Colonic diverticulosis. 4. Small fat containing left inguinal hernia.   11/10/2019 Imaging   CT Chest 11/10/19  IMPRESSION: 1. There is a large mass of the lower third of the esophagus, with ill-defined margins, measuring approximately 6.0 x 5.4 x 7.7 cm. This appears enlarged and more appreciably masslike than appearance on prior CT dated 09/14/2019. 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 2. There are prominent gastrohepatic ligament lymph nodes adjacent to the lesser curvature, concerning for nodal metastatic disease although not ideally imaged on this examination of the chest. 3. No evidence of metastatic disease within in the chest. 4. Coronary artery disease.   11/22/2019 PET scan   PET 11/22/19 IMPRESSION: 1. Intensely  hypermetabolic (max SUV 0000000) lower thoracic esophageal 6.0 x 5.5 x 9.4 cm mass extending to the esophagogastric junction with probable involvement of the gastric cardia, compatible with primary esophageal malignancy. 2. Hypermetabolic gastrohepatic ligament nodal metastases. 3. No hypermetabolic liver or other distant metastases. 4. Chronic findings include: Aortic Atherosclerosis (ICD10-I70.0). Coronary atherosclerosis. Marked diffuse colonic diverticulosis.   11/23/2019 Procedure   EGD with Upper Endoscopy by Dr [name] 11/23/19 IMPRESSION - Partially obstructing, likely malignant esophageal tumor was found in the lower third of the esophagus. Biopsied. 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 Biopsied. - Normal duodenal bulb, first portion of the duodenum and second portion of the duodenum.   11/23/2019 Initial Biopsy   FINAL MICROSCOPIC DIAGNOSIS:   A. STOMACH, CARDIA, BIOPSY:  - Invasive well-differentiated squamous cell carcinoma.  See comment   B. ESOPHAGUS, DISTAL, BIOPSY:  - Invasive well-differentiated squamous cell carcinoma.  See comment     COMMENT:   A  B.   Dr. Tresa Moore reviewed the case and concurs with the diagnosis.  Dr. [name] was paged on 11/24/2019.    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 Marked reduction in activity in the distal esophagus and extending into  the stomach, maximum SUV 3.8,  formerly 20.2. There does continue to be circumferential wall thickening in the distal esophagus. 2. Right gastric lymph node is stable in size at 1.1 cm in short axis, but has decreased in activity from previous SUV of 4.8 to current maximum SUV of 2.2. 3. Other imaging findings of potential clinical significance: Aortic Atherosclerosis (ICD10-I70.0). Coronary atherosclerosis. Hepatic and renal cysts. Colonic diverticulosis. Stranding in the central mesentery with some scattered mesenteric lymph nodes favoring sclerosing mesenteritis.   02/24/2020 Pathology Results   XI ROBOTIC ASSISTED IVOR LEWIS ESOPHAGECTOMY   FINAL MICROSCOPIC DIAGNOSIS:   A. LYMPH NODE, CELIAC, EXCISION:  - Focal atypia.  - See comment.   B. LYMPH NODE, LEVEL 4, EXCISION:  - One benign lymph node (0/1).   C. ESOP HAGUS, ESOPHAGECTOMY:  - Ulcer with inflammation, fibrosis and dystrophic calcifications.  - Focal residual keratin with giant cell reaction.  - No residual viable carcinoma identified.  - Margins free of tumor.  - Thirteen benign lymph nodes (0/13).  - See oncology table.   D. ADDITIONAL DISTAL MARGIN, EXCISION:  - Benign stomach.  - No evidence of malignancy.   COMMENT:  A. The celiac lymph node specimen consists of blood and connective  tissue with no lymph node tissue identified.  There are several minute  fragments of epithelioid cells with mild atypia which are not sufficient  for diagnosis of malignancy.   02/24/2020 Cancer Staging   Staging form: Esophagus - Squamous Cell Carcinoma, AJCC 8th Edition - Pathologic stage from 02/24/2020: pT0, pN0, cM0 - Signed by Malachy Mood, MD on 05/10/2020   03/07/2020 Imaging   CT CAP  IMPRESSION: 1. Previous esophagectomy and pull-through surgery. 2. Large loculated pleural fluid collection posteriorly on the left with air bubbles and air-fluid level consistent with empyema. The left-sided chest tube is not visibly in communication with  the loculated collection. 3. Small amount of pleural fluid on the right with dependent pulmonary atelectasis. 4. 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        CURRENT THERAPY:  Surveillance   INTERVAL HISTORY:  Brett Dawson is here for a follow up. He presents to the clinic alone. He came in a wheelchair.   He underwent esophagectomy and J-tube placement on February 24, 2020.  He his hospital course was prolonged due to J-tube site infection,anastomotic leak, bilateral pleural effusion required draining tube, esophageal stent placement etc, and was finally discharged home on April 21, 2020.  He lives with his son, is able to ambulate with a walker.  He is on tube feeds, along with clear liquid diet.  He is gaining some energy back, overall doing better since he got home 3 weeks ago. No fever, chill, significant pain, nausea, or other new symptoms.  The stitches of J-tube is loose, he is scheduled to see Dr. Cliffton Asters in 2 weeks.   All other systems were reviewed with the patient and are negative.  MEDICAL HISTORY:  Past Medical History:  Diagnosis Date  . Anemia   . Anemia   . Malignant neoplasm of lower third of esophagus ([facility]) 11/24/2019    SURGICAL HISTORY: Past Surgical History:  Procedure Laterality Date  . BIOPSY  11/23/2019   Procedure: BIOPSY;  Surgeon: Willis Modena, MD;  Location: [facility];  Service: Endoscopy;;  . ESOPHAGEAL STENT PLACEMENT N/A 04/11/2020   Procedure: ESOPHAGOGASTROSCOPY WITH ESOPHAGEAL STENT PLACEMENT;  Surgeon: Corliss Skains, MD;  Location: [facility] OR;  Service:  Thoracic;  Laterality: N/A;  . ESOPHAGOGASTRODUODENOSCOPY N/A 02/24/2020   Procedure: ESOPHAGOGASTRODUODENOSCOPY (EGD);  Surgeon: Lajuana Matte, MD;  Location: [facility] OR;  Service: Thoracic;  Laterality: N/A;  . ESOPHAGOGASTRODUODENOSCOPY (EGD) WITH PROPOFOL N/A 11/23/2019   Procedure: ESOPHAGOGASTRODUODENOSCOPY (EGD) WITH PROPOFOL;  Surgeon:  Arta Silence, MD;  Location: [facility];  Service: Endoscopy;  Laterality: N/A;  . INTERCOSTAL NERVE BLOCK Right 02/24/2020   Procedure: INTERCOSTAL NERVE BLOCK;  Surgeon: Lajuana Matte, MD;  Location: [place];  Service: Thoracic;  Laterality: Right;  . IR EMBO ART  VEN HEMORR LYMPH EXTRAV  [place]  04/07/2020  . IR FLUORO GUIDED NEEDLE PLC ASPIRATION/INJECTION LOC  03/23/2020  . IR LYMPHANGIOGRAM PEL/ABD BILAT  03/23/2020  . IR LYMPHANGIOGRAM PEL/ABD BILAT  04/07/2020  . IR US GUIDANCE  03/23/2020  . IR US GUIDANCE  03/23/2020  . IR US GUIDE VASC ACCESS LEFT  04/07/2020  . IR US GUIDE VASC ACCESS RIGHT  04/07/2020  . RADIOLOGY WITH ANESTHESIA N/A 03/23/2020   Procedure: Malignant neoplasm of esophagus;  Surgeon: Sandi Mariscal, MD;  Location: [place];  Service: Radiology;  Laterality: N/A;  . RADIOLOGY WITH ANESTHESIA N/A 04/07/2020   Procedure: IR WITH ANESTHESIA LYMPHANGIOGRAM BILATERAL WITH EMBOLIZATION;  Surgeon: Criselda Peaches, MD;  Location: [place];  Service: Radiology;  Laterality: N/A;  . TONSILLECTOMY    . VIDEO ASSISTED THORACOSCOPY (VATS)/EMPYEMA Left 04/11/2020   Procedure: LEFT VIDEO ASSISTED THORACOSCOPY (VATS) WITH IRRIGATION AND CHEST TUBE PLACEMENT;  Surgeon: Lajuana Matte, MD;  Location: [place];  Service: Thoracic;  Laterality: Left;    I have reviewed the social history and family history with the patient and they are unchanged from previous note.  ALLERGIES:  is allergic to bee venom and strawberry extract.  MEDICATIONS:  Current Outpatient Medications  Medication Sig Dispense Refill  . famotidine (PEPCID) 40 MG/5ML suspension Take 2.5 mLs (20 mg total) by mouth 2 (two) times daily. 50 mL 1  . acetaminophen (TYLENOL) 160 MG/5ML solution Place 20.3 mLs (650 mg total) into feeding tube every 4 (four) hours as needed for fever (fever >100.5). (Patient not taking: Reported on 05/02/2020) 120 mL 0  . fluticasone (FLONASE) 50 MCG/ACT nasal spray  Place 2 sprays into both nostrils daily. (Patient not taking: Reported on 05/02/2020) 16 g 2  . guaiFENesin (ROBITUSSIN) 100 MG/5ML SOLN Place 15 mLs (300 mg total) into feeding tube every 4 (four) hours as needed for cough or to loosen phlegm. (Patient not taking: Reported on 05/02/2020) 236 mL 3  . Nutritional Supplements (FEEDING SUPPLEMENT, OSMOLITE 1.5 CAL,) LIQD Place 1,760 mLs into feeding tube daily. 1760 mL 360  . Nutritional Supplements (FEEDING SUPPLEMENT, PROSOURCE TF,) liquid Place 45 mLs into feeding tube 3 (three) times daily. (Patient not taking: Reported on 05/02/2020) 4050 mL 6  . omeprazole (PRILOSEC) 20 MG capsule Take 40 mg (2 capsules) twice daily per tube (Patient not taking: Reported on 05/02/2020) 120 capsule 3  . Water For Irrigation, Sterile (FREE WATER) SOLN Place 75 mLs into feeding tube every 4 (four) hours. 1000 mL 12   No current facility-administered medications for this visit.    PHYSICAL EXAMINATION: ECOG PERFORMANCE STATUS: 3 - Symptomatic, >50% confined to bed  Vitals:   05/11/20 1022  BP: 98/63  Pulse: 93  Resp: 17  Temp: (!) 97.2 F (36.2 C)  SpO2: 95%   Filed Weights   05/11/20 1022  Weight: 181 lb 11.2 oz (82.4 kg)    GENERAL:alert, no  distress and comfortable SKIN: skin color, texture, turgor are normal, no rashes or significant lesions EYES: normal, Conjunctiva are pink and non-injected, sclera clear NECK: supple, thyroid normal size, non-tender, without nodularity LYMPH:  no palpable lymphadenopathy in the cervical, axillary  LUNGS: clear to auscultation and percussion with normal breathing effort HEART: regular rate & rhythm and no murmurs and no lower extremity edema ABDOMEN:abdomen soft, non-tender and normal bowel sounds, incisions have healed well, (+) J-tube at LUQ with mild discharge and loose stitches. Musculoskeletal:no cyanosis of digits and no clubbing  NEURO: alert & oriented x 3 with fluent speech, no focal motor/sensory  deficits  LABORATORY DATA:  I have reviewed the data as listed CBC Latest Ref Rng & Units 05/11/2020 04/10/2020 04/07/2020  WBC 4.0 - 10.5 K/uL 6.7 6.8 6.0  Hemoglobin 13.0 - 17.0 g/dL 12.0(L) 8.5(L) 8.7(L)  Hematocrit 39.0 - 52.0 % 36.6(L) 27.3(L) 26.2(L)  Platelets 150 - 400 K/uL 308 266 221     CMP Latest Ref Rng & Units 05/11/2020 04/12/2020 04/11/2020  Glucose 70 - 99 mg/dL 125(H) 110(H) 121(H)  BUN 8 - 23 mg/dL 10 13 14   Creatinine 0.61 - 1.24 mg/dL 0.71 0.40(L) 0.34(L)  Sodium 135 - 145 mmol/L 137 135 137  Potassium 3.5 - 5.1 mmol/L 4.3 3.6 3.4(L)  Chloride 98 - 111 mmol/L 100 102 103  CO2 22 - 32 mmol/L 27 25 25   Calcium 8.9 - 10.3 mg/dL 9.9 7.8(L) 7.5(L)  Total Protein 6.5 - 8.1 g/dL 7.4 - -  Total Bilirubin 0.3 - 1.2 mg/dL 0.8 - -  Alkaline Phos 38 - 126 U/L 102 - -  AST 15 - 41 U/L 18 - -  ALT 0 - 44 U/L 15 - -      RADIOGRAPHIC STUDIES: I have personally reviewed the radiological images as listed and agreed with the findings in the report. No results found.   ASSESSMENT & PLAN:  Brett Dawson is a 65 y.o. male with    1.Esophagealsquamous cell carcinoma, in distalesophagus,cTxN1M0, ypT0N0 -He presented with symptoms in May 2021, but was not diagnosed until 11/23/19 EGD where path showed Invasive well-differentiated squamous cell carcinomaof esophaguswhich extend tostomach cardia. -He was treated with concurrent chemoradiation with weekly carboplatin and Taxol8/2/21-01/13/20, last chemo 01/03/2020, cycle 6 held due to neutropenia. -He underwent esophagectomy on 02/24/20. Path showed no residual carcinoma, 13 LN negative.  He had complete response to neoadjuvant therapy.  This is a excellent prognostic factors, predicts low risk of recurrence, although the risk is not zero. -Need any adjuvant therapy, such as immunotherapy, due to the complete response. -We discussed esophageal cancer surveillance.  We will see him every 3 to 4 months in the first 2 to 3 years, then  every 6 to 12 months for a total of 5 years.  Plan to repeat CT scan every 6 months for the first few years, then yearly for 1-2 years -He is recovering slowly from surgery, he will follow-up with Dr. Kipp Brood in 2 weeks   2. Iron deficiency anemia secondary to blood loss -Secondary to #1 -The patient received blood transfusion on 09/27/19 and 1 dose Feraheme on 09/18/19 while in the hospital for symptomatic anemia.Due to persistent low iron and anemia, he received 2 more IV iron doses in 11/2019. -anemia much improved    3. Weight loss and dysphagia, esophageal stent  -dysphagia resolved after chemoRT -He had a prolonged hospitalization due to complications from an anastomotic leak as well as a thoracic duct leak.  He underwent esophageal stent placement on 04/11/2020 and has had marked improvement of his chest tube output.  He is back on a clear liquid diet tolerating tube feeds nocturnally.   -f/u with dietician   PLAN: -Surgical pathology reviewed, complete response, no need additional adjuvant therapy  -f/u with dietician for tube feeds, I will send a message  -will address his J-tube site today  -f/u with Dr. Kipp Brood  -lab and f/u in 3 months     No problem-specific Assessment & Plan notes found for this encounter.   No orders of the defined types were placed in this encounter.  All questions were answered. The patient knows to call the clinic with any problems, questions or concerns. No barriers to learning was detected. The total time spent in the appointment was 30 minutes.     [name], MD 05/11/2020   I, Joslyn Devon, am acting as scribe for [name], MD.   I have reviewed the above documentation for accuracy and completeness, and I agree with the above.

## 2020-05-11 ENCOUNTER — Inpatient Hospital Stay: Payer: 59 | Attending: Hematology | Admitting: Hematology

## 2020-05-11 ENCOUNTER — Other Ambulatory Visit: Payer: Self-pay

## 2020-05-11 ENCOUNTER — Telehealth: Payer: Self-pay | Admitting: Hematology

## 2020-05-11 ENCOUNTER — Inpatient Hospital Stay: Payer: 59

## 2020-05-11 ENCOUNTER — Encounter: Payer: Self-pay | Admitting: Hematology

## 2020-05-11 VITALS — BP 98/63 | HR 93 | Temp 97.2°F | Resp 17 | Ht 71.0 in | Wt 181.7 lb

## 2020-05-11 DIAGNOSIS — Z923 Personal history of irradiation: Secondary | ICD-10-CM | POA: Insufficient documentation

## 2020-05-11 DIAGNOSIS — Z9221 Personal history of antineoplastic chemotherapy: Secondary | ICD-10-CM | POA: Insufficient documentation

## 2020-05-11 DIAGNOSIS — C155 Malignant neoplasm of lower third of esophagus: Secondary | ICD-10-CM | POA: Diagnosis present

## 2020-05-11 DIAGNOSIS — Z79899 Other long term (current) drug therapy: Secondary | ICD-10-CM | POA: Insufficient documentation

## 2020-05-11 DIAGNOSIS — D508 Other iron deficiency anemias: Secondary | ICD-10-CM

## 2020-05-11 DIAGNOSIS — D649 Anemia, unspecified: Secondary | ICD-10-CM | POA: Diagnosis not present

## 2020-05-11 LAB — CBC WITH DIFFERENTIAL (CANCER CENTER ONLY)
Abs Immature Granulocytes: 0.02 10*3/uL (ref 0.00–0.07)
Basophils Absolute: 0 10*3/uL (ref 0.0–0.1)
Basophils Relative: 1 %
Eosinophils Absolute: 0.2 10*3/uL (ref 0.0–0.5)
Eosinophils Relative: 3 %
HCT: 36.6 % — ABNORMAL LOW (ref 39.0–52.0)
Hemoglobin: 12 g/dL — ABNORMAL LOW (ref 13.0–17.0)
Immature Granulocytes: 0 %
Lymphocytes Relative: 4 %
Lymphs Abs: 0.3 10*3/uL — ABNORMAL LOW (ref 0.7–4.0)
MCH: 29.3 pg (ref 26.0–34.0)
MCHC: 32.8 g/dL (ref 30.0–36.0)
MCV: 89.5 fL (ref 80.0–100.0)
Monocytes Absolute: 0.6 10*3/uL (ref 0.1–1.0)
Monocytes Relative: 9 %
Neutro Abs: 5.6 10*3/uL (ref 1.7–7.7)
Neutrophils Relative %: 83 %
Platelet Count: 308 10*3/uL (ref 150–400)
RBC: 4.09 MIL/uL — ABNORMAL LOW (ref 4.22–5.81)
RDW: 17 % — ABNORMAL HIGH (ref 11.5–15.5)
WBC Count: 6.7 10*3/uL (ref 4.0–10.5)
nRBC: 0 % (ref 0.0–0.2)

## 2020-05-11 LAB — FERRITIN: Ferritin: 224 ng/mL (ref 24–336)

## 2020-05-11 LAB — CMP (CANCER CENTER ONLY)
ALT: 15 U/L (ref 0–44)
AST: 18 U/L (ref 15–41)
Albumin: 3.1 g/dL — ABNORMAL LOW (ref 3.5–5.0)
Alkaline Phosphatase: 102 U/L (ref 38–126)
Anion gap: 10 (ref 5–15)
BUN: 10 mg/dL (ref 8–23)
CO2: 27 mmol/L (ref 22–32)
Calcium: 9.9 mg/dL (ref 8.9–10.3)
Chloride: 100 mmol/L (ref 98–111)
Creatinine: 0.71 mg/dL (ref 0.61–1.24)
GFR, Estimated: >60 mL/min (ref >60)
Glucose, Bld: 125 mg/dL — ABNORMAL HIGH (ref 70–99)
Potassium: 4.3 mmol/L (ref 3.5–5.1)
Sodium: 137 mmol/L (ref 135–145)
Total Bilirubin: 0.8 mg/dL (ref 0.3–1.2)
Total Protein: 7.4 g/dL (ref 6.5–8.1)

## 2020-05-11 MED ORDER — FAMOTIDINE 40 MG/5ML PO SUSR: 20.0000 mg | Freq: Two times a day (BID) | ORAL | 1 refills | Status: DC

## 2020-05-11 NOTE — Telephone Encounter (Signed)
Scheduled appointments per 1/6 los; Spoke to patient who is aware of appointments dates and times.  

## 2020-05-16 ENCOUNTER — Encounter: Payer: Self-pay | Admitting: *Deleted

## 2020-05-16 ENCOUNTER — Other Ambulatory Visit: Payer: Self-pay

## 2020-05-16 ENCOUNTER — Other Ambulatory Visit: Payer: Self-pay | Admitting: *Deleted

## 2020-05-16 ENCOUNTER — Inpatient Hospital Stay: Payer: 59

## 2020-05-16 DIAGNOSIS — M109 Gout, unspecified: Secondary | ICD-10-CM | POA: Insufficient documentation

## 2020-05-16 DIAGNOSIS — D509 Iron deficiency anemia, unspecified: Secondary | ICD-10-CM | POA: Insufficient documentation

## 2020-05-16 DIAGNOSIS — K5900 Constipation, unspecified: Secondary | ICD-10-CM | POA: Insufficient documentation

## 2020-05-16 DIAGNOSIS — E669 Obesity, unspecified: Secondary | ICD-10-CM | POA: Insufficient documentation

## 2020-05-16 DIAGNOSIS — K0530 Chronic periodontitis, unspecified: Secondary | ICD-10-CM | POA: Insufficient documentation

## 2020-05-16 DIAGNOSIS — K219 Gastro-esophageal reflux disease without esophagitis: Secondary | ICD-10-CM | POA: Insufficient documentation

## 2020-05-16 NOTE — Progress Notes (Signed)
Nutrition Assessment   Reason for Assessment:  Referral from Dr Burr Medico   ASSESSMENT:  65 year old male with esophageal cancer s/p neoadjuvant chemotherapy and radiation therapy (01/13/20).  Ivor Lewis esophagectomy and J-tube placement on 10/21.  Hospital notes reviewed.  Noted esophageal stent placed, left VATS for chest irrigation and chest tube placement.   Patient with J-tube for nutrition.    Spoke with patient via phone.  Patient is currently on osmolite 1.5 per patient. Using 4 cartons per day via J-tube.  Turns pump on to rate of 111ml/hr at midnight and turns if off at 7:30-8am.  Drinking broth, water (2-3 16 oz bottles of water), tea, juices, boost breeze 2 a day.  Flushes tube at the beginning of feeding with 87ml of water and when turns pump off.  Give additional 1 syringe of water 1-2 times during the day.  Reports urinating well. Having bowel movement about 1 time per day.     Medications: reviewed   Labs: reviewed   Anthropometrics:   Height: 71 inches Weight: 181 lb 1/6 UBW: 210 lb 01/2020 BMI: 25  14% weight loss in the last 3 months, significant  Estimated Energy Needs  Kcals: [phone] Protein: 123-143 g Fluid: > 2.4 L   NUTRITION DIAGNOSIS: Inadequate oral intake related to esophageal stent placement and complications from surgery as evidenced by 14% weight loss in the last 3 months, only able to drink clear liquids   INTERVENTION:  Patient to continue clear liquid diet until approved advanced by surgeon. Recommend increasing either rate of tube feeding or length of time on pump to increase cartons nutrition and better meet nutritional needs.  Patient does not want to increase length on pump.  Agreeable to increasing rate of pump. Rate of pump will need to go to 153ml/hr to get 5 cartons in in 8 hours on pump.  Recommend patient try 165ml/hr for 2 days and see how he feels before increasing to 171ml/hr.  Patient agreeable.   Tube feeding regimen with 5 cartons  of osmolite 1.5 will provide 1775 calories, 74.5 g protein and 1139ml free water (formula and flush).   Additional 480 calories and 18 g protein provided by boost breeze.   Contact information provided   MONITORING, EVALUATION, GOAL: weight trends, intake, tube feeding   Next Visit: Jan 19th (Wednesday) phone call  Jaylena Holloway B. Zenia Resides, [place], [place] Registered Dietitian [phone] (mobile)

## 2020-05-16 NOTE — Patient Outreach (Signed)
[facility]) Care Management  05/16/2020  Brett Dawson 12-02-1955 [id number]   [facility] outreach for complex care patient  Brett Dawson was referred to [facility] on 11/24/19 by [facility] Referral Reason:esophageal tumor with invasion into cradia  Admissions x2in last 6 months 02/24/20 admission for robotic assisted Ivor-Lewis esophagectomy with jejunostomy tube placement by Dr Cliffton Asters - discharged from [facility] on 04/21/20 home with [facility] infusions, remote health services - J tube in place  11/23/19 EGD with upper endoscopy Dr Dulce Sellar 7/16/21AnotherFeraheme dosegiven Pre referral Admission5/14/21 to 09/19/19 syncopeHgb 6.7 given blood+ Feraheme on 09/18/19 dxsystemic iron deficiency anemia observed and Hgb increased to 7.7 followed by [facility] gastroenterology  Transition of care services noted to be completed by primary care MD office staff- Dr Marcy Siren Transition of Care will be completed by primary care provider office who will refer to [facility] care management if needed.  04/24/20 notes from [facility] nurse call center [facility] caller with a feeding tube- wants to know if he should be drinking more liquids or put water in feeding bag. Obtained additional information from him. He was in the hospital for 50 days- has esophageal CA. Came home w/feeding tube and is allowed to drink clear liquids only. Has HH services. Educated caller in clear liquids, avoid hot/cold liquids, and sit up after drinking or taking feeding tube liquids. Instructed him to keep a log of clear liquids drank (amount, type) and check with his nurse if any specific liquids should be avoided, such as orange juice or other acidic juices that may irritate esophagus. Reminded him that he can always call his [facility] agency too as they have on-call nurse 24 hrs. a day. Advised him to call back if any other questions or concerns.   Protocol Used: Feeding Tube Symptoms and Questions (Adult)   Protocol-Based Disposition: Home Care   Positive Triage Question:  * Normal feeding tube function  * All higher-acuity triage questions were negative   Care Advice Discussed:  * Keep Head Elevated During Feedings  * Reassurance and Education - Normal Tube Function  * Reasons To Call Back      [facility] successful outreach for 04/21/20 post hospital and 04/24/20 post nurse call center outreach- Last outreach was prior to his admission on 02/23/20  "Feeling fine", " weak. No energy. Still on liquid diet and have a draining tube" (Osmolite via tube, boost po)  He has not been able to return to work, remaining primarily at home with few visitors to include his brother, his daughter, a male friend from work, a friend that transport him to medical appointments, his landlord and his [facility] nurse He had covid vaccines x 2  He has only been out twice for appointments He reports he is coping well and denies depression at this time    Medical/nutrition concerns He voiced concern with losing muscle mass and weight He and [facility] RN CM went over Body Mass Index (BMI). On 05/11/20 he had a BM I of 25.35 He reports telephonic outreaches from nutritionist, Joli A He voiced he remains weak with no energy Last Hgb was 12  He and [facility] RN CM discussed his anemia and iron He reports he is NOT taking iron tablets as he does not want to have constipation Reports stools are presently small formed and he does not want to risk constipation He "spits up a white tasteless" secretion during the day "frequently" only Not at night PRN medications not  seeming to resolve issue  Pending further stenting procedure per pt  Coughing caused abdominal tenderness Poor circulation in feet mentioned to oncology already no intervention yet Has not had further gout symptoms so is not having to take prn colchicine   Financial concerns Still has job as he reports but 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 [employer] He is not able to tell his employer when and if he will be able to return at a part time level as he continues to not be released by his medical providers to return  Follow up next with Dr Kipp Brood Cardiothoracic surgeon on 05/26/20 He does not have the finances to go to various MD appointments   He voices concerns of possible eviction, losing his housing  With assessment he confirms he has caught up on his rent as best as he could after being discharged but is running out of funds He reports as of today he has only $800 He has not paid for January 2022 rent as he tries also to keep money for medications, appointment co pays and other household bills   Has some medications at his pharmacy he has not picked up Filed for short term disability  Received a check from supplemental security income ([employer]) Has not cashed check has not sent back  He does not want to retirement until age 79 to get his full benefits  [facility] RN CM discuss [facility] SW services  He agrees to [facility] SW referral for possible resources to assist with household needs   Plans Patient agrees to care plan and follow up [facility] RN CM will follow up with Brett Dawson within the next 14-21 business days Pt encouraged to return a call to [facility] RN CM prn Routed note to MD Goals      Patient Stated   .  [facility]) Find Help in My Community (pt-stated)      Timeframe:  Long-Range Goal Priority:  High Start Date:         05/16/20                    Expected End Date:     11/13/20                  Follow Up Date 05/30/20   - begin a notebook of services in my neighborhood or community - follow-up on any referrals for help I am given - make a list of family or friends that I can call     Notes:     .  ([facility]) Keep Nausea and Vomiting Under Control-Cancer Posttreatment (pt-stated)      Timeframe:  Short-Term Goal Priority:  Medium Start Date:                    05/16/20         Expected End Date:              07/14/20          Follow Up Date 05/30/20   - keep track of which medicine helps and which does not - see a dietitian to help plan meals - take medicine for nausea on a regular schedule     Notes: 05/16/20 He "spits up a white tasteless" secretion during the day "frequently" only Not at night    .  [facility]) Manage Fatigue (Tiredness-Cancer Posttreatment) (pt-stated)      Timeframe:  Short-Term Goal Priority:  Medium Start Date:  05/16/20    Expected End Date:      07/14/20                 Follow Up Date 05/30/20    - eat healthy - maintain a healthy weight       Notes: 05/16/20 He and [facility] RN CM went over Body Mass Index (BMI). On 05/11/20 he had a BM I of 25.35 He reports telephonic outreaches from nutritionist, Joli A He voiced he remains weak with no energy Last Hgb was 12  He and [facility] RN CM discussed his anemia and iron He reports he is NOT taking iron tablets as he does not want to have constipation Reports stools are presently small formed and he does not want to risk constipation He "spits up a white tasteless" secretion during the day "frequently"    .  [facility]) Manage My Medicine (pt-stated)      Timeframe:  Short-Term Goal Priority:  Medium Start Date:                 05/16/20            Expected End Date:                       Follow Up Date 05/30/20    - keep a list of all the medicines I take; vitamins and herbals too  .     Notes:  05/16/20 He reports he is NOT taking iron tablets as he does not want to have constipation Reports stools are presently small formed and he does not want to risk constipation Has some medications at his pharmacy he has not picked up        [place]. Lavina Hamman, RN, BSN, [place] Coordinator Office number [phone] [facility] number [phone] Fax number [phone]

## 2020-05-24 ENCOUNTER — Inpatient Hospital Stay: Payer: 59

## 2020-05-24 NOTE — Progress Notes (Addendum)
Nutrition Follow-up:  Patient with esophageal cancer s/p neoadjuvant chemotherapy and radiation therapy (01/13/20).  Noted esophageal stent placed.  Patient with J-tube for nutrition  Spoke with patient via phone.  Patient reports that he is tolerating osmolite 1.5, 5 cartons at 11ml/hr for about 9 hours (midnight to 9am).  Continues to drink clear liquids.  Has about 1 boost breeze shake left. No money to purchase more shakes.  Reports more gas with increase tube feeding.   Sees surgeon on 1/21 for follow-up  Medications: reviewed  Labs: reviewed  Anthropometrics:   181 lb on 1/6 UBW 210 lb 01/2020   Estimated Energy Needs  Kcals: 3329-5188 Protein: 123-143 g Fluid: > 2.4 L  NUTRITION DIAGNOSIS: Inadequate oral intake continues relying on tube feeding    INTERVENTION:  Patient to continue clear liquid diet until cleared by surgeon. Consider changing formula to TwoCal or equivalent for more calories and protein in lower volume. Patient not willing to extend time on pump.   6 cartons of Two Cal would provide 2850 calories, 119 g protein and 983ml free water in formula and better meet nutritional needs.  RD has left message for RD with [facility] regarding formula options.     MONITORING, EVALUATION, GOAL: Weight trends, intake, tube feeding formula   NEXT VISIT: to be determined  Chandra Asher B. Zenia Resides, [place], [place] Registered Dietitian [phone] (mobile)

## 2020-05-26 ENCOUNTER — Encounter: Payer: Self-pay | Admitting: Thoracic Surgery (Cardiothoracic Vascular Surgery)

## 2020-05-26 ENCOUNTER — Other Ambulatory Visit: Payer: Self-pay | Admitting: *Deleted

## 2020-05-26 ENCOUNTER — Other Ambulatory Visit (HOSPITAL_COMMUNITY)
Admission: RE | Admit: 2020-05-26 | Discharge: 2020-05-26 | Disposition: A | Discharge status: Home / Self Care | Payer: 59 | Source: Ambulatory Visit | Attending: Thoracic Surgery (Cardiothoracic Vascular Surgery) | Admitting: Thoracic Surgery (Cardiothoracic Vascular Surgery)

## 2020-05-26 ENCOUNTER — Other Ambulatory Visit: Payer: Self-pay | Admitting: Hematology

## 2020-05-26 ENCOUNTER — Ambulatory Visit (INDEPENDENT_AMBULATORY_CARE_PROVIDER_SITE_OTHER): Payer: Self-pay | Admitting: Thoracic Surgery (Cardiothoracic Vascular Surgery)

## 2020-05-26 ENCOUNTER — Other Ambulatory Visit: Payer: Self-pay

## 2020-05-26 ENCOUNTER — Encounter (HOSPITAL_COMMUNITY): Payer: Self-pay | Admitting: Thoracic Surgery (Cardiothoracic Vascular Surgery)

## 2020-05-26 VITALS — BP 117/73 | HR 90 | Temp 97.6°F | Resp 20 | Ht 71.0 in | Wt 183.0 lb

## 2020-05-26 DIAGNOSIS — Z01812 Encounter for preprocedural laboratory examination: Secondary | ICD-10-CM | POA: Insufficient documentation

## 2020-05-26 DIAGNOSIS — Z09 Encounter for follow-up examination after completed treatment for conditions other than malignant neoplasm: Secondary | ICD-10-CM

## 2020-05-26 DIAGNOSIS — C155 Malignant neoplasm of lower third of esophagus: Secondary | ICD-10-CM

## 2020-05-26 DIAGNOSIS — Z20822 Contact with and (suspected) exposure to covid-19: Secondary | ICD-10-CM | POA: Diagnosis not present

## 2020-05-26 DIAGNOSIS — C159 Malignant neoplasm of esophagus, unspecified: Secondary | ICD-10-CM

## 2020-05-26 LAB — SARS CORONAVIRUS 2 (TAT 6-24 HRS): SARS Coronavirus 2: NEGATIVE

## 2020-05-26 NOTE — Progress Notes (Signed)
Mr. Brett Dawson denies chest pain, becomes short of breath due to weakness. Mr. Brett Dawson denies any s/s of Covid, patient was tested today and is in quarantine until Monday.  Mr. Brett Dawson is not taking any medications at this time.  Patient is getting tube feeding from 12 mn to 0730. Mr. Brett Dawson will start feeding earlier and stopping and flushing prior to midnight.

## 2020-05-26 NOTE — Progress Notes (Signed)
     [address]       [place] [address]             [phone]       Mr. Brett Dawson comes in the clinic for a 1 month appointment.  Overall he is doing well.  He has been tolerating his J-tube feeds and clear liquids.  He has gained 2 pounds since his last visit.  He has had minimal drainage from his chest tube.  We discussed the risks and benefits of an upper endoscopy with stent removal.  I will perform an on table esophagram and admit him to the hospital following this.  He will then undergo formal esophagram.  If this is negative then I will remove his chest tube and advance his diet.  Brett Dawson

## 2020-05-26 NOTE — H&P (View-Only) (Signed)
     [address]       [place] [address]             [phone]       Brett Dawson comes in the clinic for a 1 month appointment.  Overall he is doing well.  He has been tolerating his J-tube feeds and clear liquids.  He has gained 2 pounds since his last visit.  He has had minimal drainage from his chest tube.  We discussed the risks and benefits of an upper endoscopy with stent removal.  I will perform an on table esophagram and admit him to the hospital following this.  He will then undergo formal esophagram.  If this is negative then I will remove his chest tube and advance his diet.  Brett Dawson O Brett Dawson  

## 2020-05-28 NOTE — Anesthesia Preprocedure Evaluation (Addendum)
Anesthesia Evaluation  Patient identified by MRN, date of birth, ID band Patient awake    Reviewed: Allergy & Precautions, NPO status , Patient's Chart, lab work & pertinent test results  Airway Mallampati: II  TM Distance: >3 FB Neck ROM: Full    Dental  (+) Loose, Missing, Chipped, Poor Dentition, Dental Advisory Given,    Pulmonary shortness of breath,    Pulmonary exam normal breath sounds clear to auscultation       Cardiovascular negative cardio ROS Normal cardiovascular exam Rhythm:Regular Rate:Normal     Neuro/Psych negative neurological ROS  negative psych ROS   GI/Hepatic Neg liver ROS, GERD  Medicated,ESOPHAGEAL CANCER s/p esophageal stent    Endo/Other  negative endocrine ROS  Renal/GU negative Renal ROS     Musculoskeletal  (+) Arthritis ,   Abdominal   Peds  Hematology negative hematology ROS (+)   Anesthesia Other Findings   Reproductive/Obstetrics                            Anesthesia Physical Anesthesia Plan  ASA: III  Anesthesia Plan: General   Post-op Pain Management:    Induction: Intravenous  PONV Risk Score and Plan: 2 and Midazolam, Dexamethasone and Ondansetron  Airway Management Planned: Oral ETT  Additional Equipment:   Intra-op Plan:   Post-operative Plan: Extubation in OR  Informed Consent: I have reviewed the patients History and Physical, chart, labs and discussed the procedure including the risks, benefits and alternatives for the proposed anesthesia with the patient or authorized representative who has indicated his/her understanding and acceptance.     Dental advisory given  Plan Discussed with: CRNA  Anesthesia Plan Comments:        Anesthesia Quick Evaluation                                  Anesthesia Evaluation  Patient identified by MRN, date of birth, ID band Patient awake    Reviewed: Allergy & Precautions, NPO status  , Patient's Chart, lab work & pertinent test results  Airway Mallampati: II  TM Distance: >3 FB Neck ROM: Full    Dental no notable dental hx. (+) Poor Dentition, Chipped, Missing, Loose,    Pulmonary neg pulmonary ROS,    Pulmonary exam normal breath sounds clear to auscultation       Cardiovascular Exercise Tolerance: Good negative cardio ROS Normal cardiovascular exam Rhythm:Regular Rate:Normal     Neuro/Psych negative neurological ROS  negative psych ROS   GI/Hepatic negative GI ROS, Neg liver ROS,   Endo/Other  negative endocrine ROS  Renal/GU negative Renal ROSK+ 4.8 Cr 0.78  negative genitourinary   Musculoskeletal negative musculoskeletal ROS (+)   Abdominal   Peds negative pediatric ROS (+)  Hematology negative hematology ROS (+) Blood dyscrasia, anemia , Hgb 10.6   Anesthesia Other Findings S/P resection of malignant neoplasm of Esophagus   Reproductive/Obstetrics negative OB ROS                            Anesthesia Physical  Anesthesia Plan  ASA: III  Anesthesia Plan: General   Post-op Pain Management:    Induction: Intravenous  PONV Risk Score and Plan: Treatment may vary due to age or medical condition, Ondansetron, Dexamethasone and Midazolam  Airway Management Planned: Oral ETT and LMA  Additional Equipment: None  Intra-op  Plan:   Post-operative Plan: Extubation in OR  Informed Consent: I have reviewed the patients History and Physical, chart, labs and discussed the procedure including the risks, benefits and alternatives for the proposed anesthesia with the patient or authorized representative who has indicated his/her understanding and acceptance.     Dental advisory given  Plan Discussed with: CRNA and Anesthesiologist  Anesthesia Plan Comments:        Anesthesia Quick Evaluation                                   Anesthesia Evaluation  Patient identified by MRN, date of birth, ID  band Patient awake    Reviewed: Allergy & Precautions, NPO status , Patient's Chart, lab work & pertinent test results  Airway Mallampati: II  TM Distance: >3 FB Neck ROM: Full    Dental no notable dental hx. (+) Poor Dentition, Chipped, Missing, Loose,    Pulmonary neg pulmonary ROS,    Pulmonary exam normal breath sounds clear to auscultation       Cardiovascular Exercise Tolerance: Good negative cardio ROS Normal cardiovascular exam Rhythm:Regular Rate:Normal     Neuro/Psych negative neurological ROS  negative psych ROS   GI/Hepatic negative GI ROS, Neg liver ROS,   Endo/Other  negative endocrine ROS  Renal/GU negative Renal ROSK+ 4.8 Cr 0.78  negative genitourinary   Musculoskeletal negative musculoskeletal ROS (+)   Abdominal   Peds negative pediatric ROS (+)  Hematology negative hematology ROS (+) Blood dyscrasia, anemia , Hgb 10.6   Anesthesia Other Findings S/P resection of malignant neoplasm of Esophagus   Reproductive/Obstetrics negative OB ROS                            Anesthesia Physical  Anesthesia Plan  ASA: III  Anesthesia Plan: General   Post-op Pain Management:    Induction: Intravenous  PONV Risk Score and Plan: Treatment may vary due to age or medical condition, Ondansetron, Dexamethasone and Midazolam  Airway Management Planned: Oral ETT and LMA  Additional Equipment: None  Intra-op Plan:   Post-operative Plan: Extubation in OR  Informed Consent: I have reviewed the patients History and Physical, chart, labs and discussed the procedure including the risks, benefits and alternatives for the proposed anesthesia with the patient or authorized representative who has indicated his/her understanding and acceptance.     Dental advisory given  Plan Discussed with: CRNA and Anesthesiologist  Anesthesia Plan Comments:        Anesthesia Quick Evaluation

## 2020-05-29 ENCOUNTER — Ambulatory Visit (HOSPITAL_COMMUNITY): Payer: 59

## 2020-05-29 ENCOUNTER — Observation Stay (HOSPITAL_COMMUNITY)
Admission: RE | Admit: 2020-05-29 | Discharge: 2020-05-30 | Disposition: A | Discharge status: Home / Self Care | Payer: 59 | Attending: Thoracic Surgery (Cardiothoracic Vascular Surgery) | Admitting: Thoracic Surgery (Cardiothoracic Vascular Surgery)

## 2020-05-29 ENCOUNTER — Other Ambulatory Visit: Payer: Self-pay

## 2020-05-29 ENCOUNTER — Ambulatory Visit (HOSPITAL_COMMUNITY): Payer: 59 | Admitting: Anesthesiology

## 2020-05-29 ENCOUNTER — Encounter (HOSPITAL_COMMUNITY): Payer: Self-pay | Admitting: Thoracic Surgery (Cardiothoracic Vascular Surgery)

## 2020-05-29 ENCOUNTER — Encounter (HOSPITAL_COMMUNITY)
Admission: RE | Disposition: A | Discharge status: Home / Self Care | Payer: Self-pay | Source: Home / Self Care | Attending: Thoracic Surgery (Cardiothoracic Vascular Surgery)

## 2020-05-29 DIAGNOSIS — C159 Malignant neoplasm of esophagus, unspecified: Secondary | ICD-10-CM | POA: Diagnosis not present

## 2020-05-29 DIAGNOSIS — Y848 Other medical procedures as the cause of abnormal reaction of the patient, or of later complication, without mention of misadventure at the time of the procedure: Secondary | ICD-10-CM | POA: Diagnosis not present

## 2020-05-29 DIAGNOSIS — Z01818 Encounter for other preprocedural examination: Secondary | ICD-10-CM

## 2020-05-29 DIAGNOSIS — Z419 Encounter for procedure for purposes other than remedying health state, unspecified: Secondary | ICD-10-CM

## 2020-05-29 DIAGNOSIS — Z8501 Personal history of malignant neoplasm of esophagus: Secondary | ICD-10-CM | POA: Insufficient documentation

## 2020-05-29 DIAGNOSIS — T82539A Leakage of unspecified cardiac and vascular devices and implants, initial encounter: Secondary | ICD-10-CM | POA: Diagnosis not present

## 2020-05-29 DIAGNOSIS — K9189 Other postprocedural complications and disorders of digestive system: Secondary | ICD-10-CM | POA: Diagnosis present

## 2020-05-29 HISTORY — DX: Personal history of other medical treatment: Z92.89

## 2020-05-29 HISTORY — PX: ESOPHAGOGASTRODUODENOSCOPY: SHX5428

## 2020-05-29 HISTORY — PX: ESOPHAGEAL STENT PLACEMENT: SHX5540

## 2020-05-29 HISTORY — DX: Dyspnea, unspecified: R06.00

## 2020-05-29 HISTORY — DX: Angina pectoris, unspecified: I20.9

## 2020-05-29 HISTORY — DX: Gout, unspecified: M10.9

## 2020-05-29 LAB — CBC
HCT: 33.5 % — ABNORMAL LOW (ref 39.0–52.0)
Hemoglobin: 10.6 g/dL — ABNORMAL LOW (ref 13.0–17.0)
MCH: 29 pg (ref 26.0–34.0)
MCHC: 31.6 g/dL (ref 30.0–36.0)
MCV: 91.5 fL (ref 80.0–100.0)
Platelets: 308 10*3/uL (ref 150–400)
RBC: 3.66 MIL/uL — ABNORMAL LOW (ref 4.22–5.81)
RDW: 17 % — ABNORMAL HIGH (ref 11.5–15.5)
WBC: 5.2 10*3/uL (ref 4.0–10.5)
nRBC: 0 % (ref 0.0–0.2)

## 2020-05-29 LAB — COMPREHENSIVE METABOLIC PANEL
ALT: 13 U/L (ref 0–44)
AST: 32 U/L (ref 15–41)
Albumin: 3 g/dL — ABNORMAL LOW (ref 3.5–5.0)
Alkaline Phosphatase: 84 U/L (ref 38–126)
Anion gap: 14 (ref 5–15)
BUN: 9 mg/dL (ref 8–23)
CO2: 23 mmol/L (ref 22–32)
Calcium: 9.3 mg/dL (ref 8.9–10.3)
Chloride: 100 mmol/L (ref 98–111)
Creatinine, Ser: 0.55 mg/dL — ABNORMAL LOW (ref 0.61–1.24)
GFR, Estimated: >60 mL/min (ref >60)
Glucose, Bld: 104 mg/dL — ABNORMAL HIGH (ref 70–99)
Potassium: 4.4 mmol/L (ref 3.5–5.1)
Sodium: 137 mmol/L (ref 135–145)
Total Bilirubin: 1 mg/dL (ref 0.3–1.2)
Total Protein: 6.5 g/dL (ref 6.5–8.1)

## 2020-05-29 LAB — MRSA PCR SCREENING: MRSA by PCR: NEGATIVE

## 2020-05-29 SURGERY — EGD (ESOPHAGOGASTRODUODENOSCOPY)
Anesthesia: General

## 2020-05-29 MED ORDER — FENTANYL CITRATE (PF) 250 MCG/5ML IJ SOLN
INTRAMUSCULAR | Status: AC
  Filled 2020-05-29: qty 5

## 2020-05-29 MED ORDER — PROPOFOL 10 MG/ML IV BOLUS
INTRAVENOUS | Status: AC
  Filled 2020-05-29: qty 20

## 2020-05-29 MED ORDER — ONDANSETRON HCL 4 MG/2ML IJ SOLN: 4.0000 mg | Freq: Once | INTRAMUSCULAR | Status: DC | PRN

## 2020-05-29 MED ORDER — ONDANSETRON HCL 4 MG/2ML IJ SOLN
INTRAMUSCULAR | Status: DC | PRN
  Administered 2020-05-29: 4 mg via INTRAVENOUS

## 2020-05-29 MED ORDER — SODIUM CHLORIDE 0.9% FLUSH
3.0000 mL | Freq: Two times a day (BID) | INTRAVENOUS | Status: DC
  Administered 2020-05-29 – 2020-05-30 (×3): 3 mL via INTRAVENOUS

## 2020-05-29 MED ORDER — MIDAZOLAM HCL 5 MG/5ML IJ SOLN
INTRAMUSCULAR | Status: DC | PRN
  Administered 2020-05-29: 1 mg via INTRAVENOUS

## 2020-05-29 MED ORDER — OSMOLITE 1.5 CAL PO LIQD
1680.0000 mL | ORAL | Status: DC
  Administered 2020-05-29: 1680 mL

## 2020-05-29 MED ORDER — DEXAMETHASONE SODIUM PHOSPHATE 10 MG/ML IJ SOLN
INTRAMUSCULAR | Status: AC
  Filled 2020-05-29: qty 1

## 2020-05-29 MED ORDER — SUCCINYLCHOLINE CHLORIDE 200 MG/10ML IV SOSY
PREFILLED_SYRINGE | INTRAVENOUS | Status: DC | PRN
  Administered 2020-05-29: 120 mg via INTRAVENOUS

## 2020-05-29 MED ORDER — PROPOFOL 10 MG/ML IV BOLUS
INTRAVENOUS | Status: DC | PRN
  Administered 2020-05-29: 20 mg via INTRAVENOUS
  Administered 2020-05-29: 150 mg via INTRAVENOUS
  Administered 2020-05-29: 20 mg via INTRAVENOUS
  Administered 2020-05-29: 30 mg via INTRAVENOUS

## 2020-05-29 MED ORDER — BOOST / RESOURCE BREEZE PO LIQD CUSTOM: 1.0000 | Freq: Three times a day (TID) | ORAL | Status: DC

## 2020-05-29 MED ORDER — LACTATED RINGERS IV SOLN: INTRAVENOUS | Status: DC

## 2020-05-29 MED ORDER — FENTANYL CITRATE (PF) 250 MCG/5ML IJ SOLN
INTRAMUSCULAR | Status: DC | PRN
  Administered 2020-05-29: 100 ug via INTRAVENOUS

## 2020-05-29 MED ORDER — ENOXAPARIN SODIUM 40 MG/0.4ML ~~LOC~~ SOLN: 40.0000 mg | SUBCUTANEOUS | Status: DC

## 2020-05-29 MED ORDER — EPHEDRINE 5 MG/ML INJ
INTRAVENOUS | Status: AC
  Filled 2020-05-29: qty 10

## 2020-05-29 MED ORDER — 0.9 % SODIUM CHLORIDE (POUR BTL) OPTIME
TOPICAL | Status: DC | PRN
  Administered 2020-05-29: 150 mL

## 2020-05-29 MED ORDER — FENTANYL CITRATE (PF) 100 MCG/2ML IJ SOLN: 25.0000 ug | INTRAMUSCULAR | Status: DC | PRN

## 2020-05-29 MED ORDER — ROCURONIUM BROMIDE 10 MG/ML (PF) SYRINGE
PREFILLED_SYRINGE | INTRAVENOUS | Status: AC
  Filled 2020-05-29: qty 10

## 2020-05-29 MED ORDER — ONDANSETRON HCL 4 MG/2ML IJ SOLN
INTRAMUSCULAR | Status: AC
  Filled 2020-05-29: qty 2

## 2020-05-29 MED ORDER — EPHEDRINE SULFATE-NACL 50-0.9 MG/10ML-% IV SOSY
PREFILLED_SYRINGE | INTRAVENOUS | Status: DC | PRN
  Administered 2020-05-29: 15 mg via INTRAVENOUS
  Administered 2020-05-29: 10 mg via INTRAVENOUS

## 2020-05-29 MED ORDER — DEXAMETHASONE SODIUM PHOSPHATE 10 MG/ML IJ SOLN
INTRAMUSCULAR | Status: DC | PRN
  Administered 2020-05-29: 8 mg via INTRAVENOUS

## 2020-05-29 MED ORDER — CHLORHEXIDINE GLUCONATE 0.12 % MT SOLN
15.0000 mL | Freq: Once | OROMUCOSAL | Status: AC
  Administered 2020-05-29: 15 mL via OROMUCOSAL
  Filled 2020-05-29: qty 15

## 2020-05-29 MED ORDER — ORAL CARE MOUTH RINSE: 15.0000 mL | Freq: Once | OROMUCOSAL | Status: AC

## 2020-05-29 MED ORDER — KETOROLAC TROMETHAMINE 30 MG/ML IJ SOLN
30.0000 mg | Freq: Four times a day (QID) | INTRAMUSCULAR | Status: DC | PRN
  Filled 2020-05-29: qty 1

## 2020-05-29 MED ORDER — LIDOCAINE 2% (20 MG/ML) 5 ML SYRINGE
INTRAMUSCULAR | Status: AC
  Filled 2020-05-29: qty 5

## 2020-05-29 MED ORDER — PHENYLEPHRINE HCL (PRESSORS) 10 MG/ML IV SOLN
INTRAVENOUS | Status: DC | PRN
  Administered 2020-05-29 (×2): 80 ug via INTRAVENOUS

## 2020-05-29 MED ORDER — ACETAMINOPHEN 500 MG PO TABS
1000.0000 mg | ORAL_TABLET | Freq: Once | ORAL | Status: DC
  Filled 2020-05-29: qty 2

## 2020-05-29 MED ORDER — ONDANSETRON HCL 4 MG/2ML IJ SOLN
4.0000 mg | Freq: Four times a day (QID) | INTRAMUSCULAR | Status: DC | PRN
  Filled 2020-05-29: qty 2

## 2020-05-29 MED ORDER — IODIXANOL 320 MG/ML IV SOLN
INTRAVENOUS | Status: DC | PRN
  Administered 2020-05-29: 150 mL

## 2020-05-29 MED ORDER — ONDANSETRON HCL 4 MG PO TABS
4.0000 mg | ORAL_TABLET | Freq: Four times a day (QID) | ORAL | Status: DC | PRN
  Filled 2020-05-29: qty 1

## 2020-05-29 MED ORDER — MIDAZOLAM HCL 2 MG/2ML IJ SOLN
INTRAMUSCULAR | Status: AC
  Filled 2020-05-29: qty 2

## 2020-05-29 MED ORDER — LIDOCAINE 2% (20 MG/ML) 5 ML SYRINGE
INTRAMUSCULAR | Status: DC | PRN
  Administered 2020-05-29: 80 mg via INTRAVENOUS

## 2020-05-29 SURGICAL SUPPLY — 35 items
BLADE CLIPPER SURG (BLADE) ×1 IMPLANT
BUTTON OLYMPUS DEFENDO 5 PIECE (MISCELLANEOUS) ×3 IMPLANT
CANISTER SUCT 3000ML PPV (MISCELLANEOUS) ×3 IMPLANT
CNTNR URN SCR LID CUP LEK RST (MISCELLANEOUS) ×1 IMPLANT
CONT SPEC 4OZ STRL OR WHT (MISCELLANEOUS)
COVER BACK TABLE 60X90IN (DRAPES) ×3 IMPLANT
DECANTER SPIKE VIAL GLASS SM (MISCELLANEOUS) ×1 IMPLANT
FORCEPS GRASP COMBO 8X230 (FORCEP) ×1 IMPLANT
GAUZE SPONGE 4X4 12PLY STRL (GAUZE/BANDAGES/DRESSINGS) ×3 IMPLANT
GLOVE BIO SURGEON STRL SZ7 (GLOVE) ×3 IMPLANT
GLOVE BIO SURGEON STRL SZ7.5 (GLOVE) ×1 IMPLANT
GOWN STRL REUS W/ TWL XL LVL3 (GOWN DISPOSABLE) ×1 IMPLANT
GOWN STRL REUS W/TWL XL LVL3 (GOWN DISPOSABLE) ×3
KIT TURNOVER KIT B (KITS) ×3 IMPLANT
MARKER SKIN DUAL TIP RULER LAB (MISCELLANEOUS) ×3 IMPLANT
NDL SCLEROTHERAPY 23X2X240 (NEEDLE) IMPLANT
NEEDLE SCLEROTHERAPY 23X2X240 (NEEDLE) IMPLANT
NS IRRIG 1000ML POUR BTL (IV SOLUTION) ×3 IMPLANT
OIL SILICONE PENTAX (PARTS (SERVICE/REPAIRS)) ×2 IMPLANT
PACK UNIVERSAL I (CUSTOM PROCEDURE TRAY) ×1 IMPLANT
PAD ARMBOARD 7.5X6 YLW CONV (MISCELLANEOUS) ×6 IMPLANT
STENT WALLFLEX 23X15.5 (STENTS) IMPLANT
SYR 20ML ECCENTRIC (SYRINGE) ×1 IMPLANT
SYR 30ML SLIP (SYRINGE) ×3 IMPLANT
TOWEL GREEN STERILE (TOWEL DISPOSABLE) ×3 IMPLANT
TOWEL GREEN STERILE FF (TOWEL DISPOSABLE) ×3 IMPLANT
TRAY FOL W/BAG SLVR 16FR STRL (SET/KITS/TRAYS/PACK) ×1 IMPLANT
TRAY FOLEY W/BAG SLVR 16FR LF (SET/KITS/TRAYS/PACK)
TUBE CONNECTING 20'X1/4 (TUBING) ×1
TUBE CONNECTING 20X1/4 (TUBING) ×2 IMPLANT
TUBE ENDOVIVE SAFETY PEG 24 (TUBING) ×1 IMPLANT
TUBING ENDO SMARTCAP (MISCELLANEOUS) ×3 IMPLANT
UNDERPAD 30X36 HEAVY ABSORB (UNDERPADS AND DIAPERS) ×3 IMPLANT
WATER STERILE IRR 1000ML POUR (IV SOLUTION) ×3 IMPLANT
WIRE HYDRA 450CM (MISCELLANEOUS) IMPLANT

## 2020-05-29 NOTE — Hospital Course (Signed)
      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  He then underwent an esophageal stent placement on 04/11/2020 with Dr. Kipp Brood.  He went home with a chest tube in place. He had a prolonged hospital stay due to complication from an anastomotic leak as well as a thoracic duct leak. He was seen back  in the office on 12/28 and had been doing well at home tolerating clear liquids and J-tube feeds at night. He was then seen again on 05/26/2020 and an esophageal stent removal was planned for 1/24. After the stent removal an on the table esophagram will be performed and he will be admitted to the hospital. Then, he will undergo a formal esophagram and if this is negative his chest tube will be removed and his diet advanced.   Hospital Course:

## 2020-05-29 NOTE — Interval H&P Note (Signed)
History and Physical Interval Note:  05/29/2020 9:26 AM  Brett Dawson  has presented today for surgery, with the diagnosis of ESOPHAGEAL CANCER.  The various methods of treatment have been discussed with the patient and family. After consideration of risks, benefits and other options for treatment, the patient has consented to  Procedure(s): ESOPHAGOGASTRODUODENOSCOPY (EGD) (N/A) ESOPHAGEAL STENT REMOVAL (N/A) as a surgical intervention.  The patient's history has been reviewed, patient examined, no change in status, stable for surgery.  I have reviewed the patient's chart and labs.  Questions were answered to the patient's satisfaction.    Pt is s/p minimally invasive esophagectomy, complicated by an anastomotic leak.  He underwent stent placement, and presents today for removal, and assessment.  Vitals:   05/29/20 0814  BP: 109/67  Pulse: 78  Resp: 18  Temp: (!) 97.5 F (36.4 C)  SpO2: 94%   Alert NAD Sinus EWOB  Murky fluid in JP bulb  OR today for EGD and stent removal.  Lajuana Matte

## 2020-05-29 NOTE — OR Nursing (Signed)
Pt is awake,alert and oriented.Pt and/or family verbalized understanding of poc and discharge instructions. Reviewed admission and on going care with receiving RN. Pt is in NAD at this time and is ready to be transferred to floor. Will con't to monitor until pt is transferred. Belongings on bed with patient Pt on Monitor in NSR

## 2020-05-29 NOTE — Plan of Care (Signed)

## 2020-05-29 NOTE — Op Note (Signed)
      [address]       [place], [address]             [phone]        05/29/2020  Patient:  Brett Dawson Pre-Op Dx: History of minimally invasive Ivor Lewis esophagectomy   Anastomotic leak   Status post esophageal stent placement Post-op Dx: Same Procedure: - Esophagogastroscopy - Esophageal stent removal - Esophagram with Omnipaque   Surgeon and Role:      * Aaryanna Hyden, Lucile Crater, MD - Primary  Anesthesia  general EBL: 0 ml Blood Administration: None Specimen: None   Counts: correct   Indications: Brett Dawson is a 65 year old male who underwent a minimally invasive Ivor Lewis esophagectomy on 02/24/2020.  He developed a late esophageal anastomotic leak, and required esophageal stent placement with 155 mm covered stent on 04/11/2020.  He comes in today for stent removal and evaluation of his mucosal surface to assess for healing.  Findings: Esophageal stent was removed without difficulty.  The staple line was identified at 20 cm from the incisors.  The previous leak site appeared well-healed.  We performed an on table esophagram and distended the esophagus with 150 mL of Omnipaque solution, and there was no evidence of any esophageal leak.  We allowed the contrast to do well within the stomach for a full 2 minutes without any evidence of extravasation.  Operative Technique: After the risks, benefits and alternatives were thoroughly discussed, the patient was brought to the operative theatre.  Anesthesia was induced. The patient was prepped and draped in normal sterile fashion.  An appropriate surgical pause was performed, and pre-operative antibiotics were dosed accordingly.  The gastroscope was advanced through the oropharynx into the cervical esophagus under direct visualization.  The scope was passed into the stomach.  The scope was then pulled back, and the esophageal was grasped and removed without difficulty.  The scope was readvanced and the mucosa  was visualized.  Previous mucosal defect was noted at 27 cm.  To be well-healed.  The staple line from the anastomosis was also evident at this area.  We injected a total of 100 and and 50 mL of Omnipaque solution and allowed to dwell.  There is no evidence of any extravasation.   The contrast passed freely into the subdiaphragmatic portion of the stomach.  All of the contrast was then suctioned out, and the scope was removed.  The patient tolerated the procedure without any immediate complications, and was transferred to the PACU in stable condition.  [name] [name]

## 2020-05-29 NOTE — Plan of Care (Signed)

## 2020-05-29 NOTE — Progress Notes (Signed)
Initial Nutrition Assessment  RD working remotely.  DOCUMENTATION CODES:   Not applicable  INTERVENTION:   Nocturnal tube feeds via J-tube: - Osmolite 1.5 @ 120 ml/hr to run over 14 hours from 2000 to 1000 (total of 1680 ml)  Tube feeding regimen provides 2520 kcal, 105 grams of protein, and 1280 ml of H2O.   - Boost Breeze po TID, each supplement provides 250 kcal and 9 grams of protein  NUTRITION DIAGNOSIS:   Inadequate oral intake related to altered GI function as evidenced by other (clear liquid diet order).  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 Pt is also s/p Ivor Lewis esophagectomy and J-tube placement on 02/24/20. Pt presented on 05/29/20 for esophageal stent removal.   RD is familiar with pt from prolonged previous acute admission. RD consult for tube feeding initiation and management. Pt remains on clear liquid diet.  Reviewed outpatient [place] RD notes. Pt had been tolerating 5 cartons of Osmolite 1.5 infusing at 120 ml/hr over 9 hours (midnight to 9:00 am). This regimen had been increased from 4 cartons of Osmolite 1.5 infusing at 110 ml/hr over 7.5-8 hours due to weight loss. Pt was drinking clear liquids at home and had been consuming some Boost Breeze at home. RD will order Boost Breeze TID.  Reviewed available weight history in chart. Pt with a total weight loss of 17.6 kg since 02/22/20 (esophagectomy and J-tube placement occurred on 02/24/20). This is a 17.7% weight loss in 3 months which is severe and significant for timeframe. During previous acute admission, pt met  criteria for severe malnutrition in the context of acute illness related to chylothorax. Suspect some degree of malnutrition persists but unable to confirm without NFPE. Will complete this at follow-up.  Medications and labs reviewed.  NUTRITION - FOCUSED PHYSICAL EXAM:  Unable to complete at this time. RD working remotely.  Diet Order:   Diet Order            Diet clear liquid Room service appropriate? Yes; Fluid consistency: Thin  Diet effective now                 EDUCATION NEEDS:   No education needs have been identified at this time  Skin:  Skin Assessment: Reviewed RN Assessment  Last BM:  no documented BM  Height:   Ht Readings from Last 1 Encounters:  05/29/20 _0  (1.803 m)    Weight:   Wt Readings from Last 1 Encounters:  05/29/20 83 kg    BMI:  Body mass index is 25.52 kg/m.  Estimated Nutritional Needs:   Kcal:  8280-0349  Protein:  125-150 grams  Fluid:  >/= 2.2 L    Gustavus Bryant, MS, RD, LDN Inpatient Clinical Dietitian Please see AMiON for contact information.

## 2020-05-29 NOTE — Transfer of Care (Signed)
Immediate Anesthesia Transfer of Care Note  Patient: Brett Dawson  Procedure(s) Performed: ESOPHAGOGASTRODUODENOSCOPY (EGD) (N/A ) ESOPHAGEAL STENT REMOVAL (N/A )  Patient Location: PACU  Anesthesia Type:General  Level of Consciousness: awake, sedated and patient cooperative  Airway & Oxygen Therapy: Patient Spontanous Breathing and Patient connected to face mask oxygen  Post-op Assessment: Report given to RN and Post -op Vital signs reviewed and stable  Post vital signs: Reviewed and stable  Last Vitals:  Vitals Value Taken Time  BP 113/64 05/29/20 1024  Temp    Pulse 73 05/29/20 1024  Resp 25 05/29/20 1023  SpO2 100 % 05/29/20 1024  Vitals shown include unvalidated device data.  Last Pain:  Vitals:   05/29/20 0835  TempSrc:   PainSc: 0-No pain         Complications: No complications documented.

## 2020-05-29 NOTE — Anesthesia Procedure Notes (Signed)
Procedure Name: Intubation Date/Time: 05/29/2020 9:39 AM Performed by: Glynda Jaeger, CRNA Pre-anesthesia Checklist: Patient identified, Patient being monitored, Timeout performed, Emergency Drugs available and Suction available Patient Re-evaluated:Patient Re-evaluated prior to induction Oxygen Delivery Method: Circle System Utilized Preoxygenation: Pre-oxygenation with 100% oxygen Induction Type: IV induction Ventilation: Mask ventilation without difficulty Laryngoscope Size: Mac and 4 Grade View: Grade II Tube type: Oral Tube size: 7.5 mm Number of attempts: 1 Airway Equipment and Method: Stylet Placement Confirmation: ETT inserted through vocal cords under direct vision,  positive ETCO2 and breath sounds checked- equal and bilateral Secured at: 21 cm Tube secured with: Tape Dental Injury: Teeth and Oropharynx as per pre-operative assessment

## 2020-05-30 ENCOUNTER — Other Ambulatory Visit: Payer: Self-pay | Admitting: *Deleted

## 2020-05-30 ENCOUNTER — Encounter (HOSPITAL_COMMUNITY): Payer: Self-pay | Admitting: Thoracic Surgery (Cardiothoracic Vascular Surgery)

## 2020-05-30 ENCOUNTER — Observation Stay (HOSPITAL_COMMUNITY): Payer: 59

## 2020-05-30 DIAGNOSIS — T82539A Leakage of unspecified cardiac and vascular devices and implants, initial encounter: Secondary | ICD-10-CM | POA: Diagnosis not present

## 2020-05-30 MED ORDER — IOHEXOL 300 MG/ML  SOLN
150.0000 mL | Freq: Once | INTRAMUSCULAR | Status: AC | PRN
  Administered 2020-05-30: 100 mL via ORAL

## 2020-05-30 NOTE — Patient Outreach (Signed)
[facility]) Care Management  05/30/2020  Brett Dawson July 12, 1955 [id number]   [facility] outreach to complex care patient- hospitalized   Brett Dawson was referred to [facility] on 11/24/19 by [facility] Referral Reason:esophageal tumor with invasion into cradia  Admissions x3in last 6 months 05/29/20-05/30/20 s/p esophagogastroscopy, esophageal stent removal and esophagram with omnipaque by Dr Kipp Brood on 05/29/20 history of minimally invasive Ivor Lewis esophagectomy, anastomotic leak, Status post esophageal stent placement  02/24/20 admission for robotic assisted Ivor-Lewis esophagectomy with jejunostomy tube placement by Dr Kipp Brood - [facility] on 04/21/20 home with advanced home health infusions, remote health services - J tube in place 11/23/19 EGD with upper endoscopy Dr [name] 7/16/21AnotherFeraheme dosegiven Pre referral Admission5/14/21 to 09/19/19 syncopeHgb 6.7 given blood+ Feraheme on 09/18/19 dxsystemic iron deficiency anemia observed and Hgb increased to 7.7 followed by [facility] gastroenterology  Transition of care services noted to be completed by primary care MD office staff- Dr Matilde Haymaker Transition of Care will be completed by primary care provider office who will refer to [facility] care management if needed.   THN Unsuccessful outreach   Outreach attempt to the home number  No answer. THN RN CM left HIPAA [facility] Portability and Accountability Act) compliant voicemail message along with CM's contact info.   Plan: [facility] RN CM scheduled this patient for another call attempt within 4-7 business days  Joshau Code L. Lavina Hamman, RN, BSN, [place] Coordinator Office number [phone] Mobile number [phone]  Main THN number [phone] Fax number [phone]

## 2020-05-30 NOTE — Plan of Care (Signed)
  Problem: Education: ?Goal: Knowledge of General Education information will improve ?Description: Including pain rating scale, medication(s)/side effects and non-pharmacologic comfort measures ?Outcome: Progressing ?  ?Problem: Health Behavior/Discharge Planning: ?Goal: Ability to manage health-related needs will improve ?Outcome: Progressing ?  ?Problem: Clinical Measurements: ?Goal: Ability to maintain clinical measurements within normal limits will improve ?Outcome: Progressing ?Goal: Will remain free from infection ?Outcome: Progressing ?Goal: Diagnostic test results will improve ?Outcome: Progressing ?Goal: Respiratory complications will improve ?Outcome: Progressing ?Goal: Cardiovascular complication will be avoided ?Outcome: Progressing ?  ?Problem: Coping: ?Goal: Level of anxiety will decrease ?Outcome: Progressing ?  ?Problem: Elimination: ?Goal: Will not experience complications related to bowel motility ?Outcome: Progressing ?Goal: Will not experience complications related to urinary retention ?Outcome: Progressing ?  ?Problem: Pain Managment: ?Goal: General experience of comfort will improve ?Outcome: Progressing ?  ?

## 2020-05-30 NOTE — Progress Notes (Signed)
RN provided detailed d/c instructions. RN answered all questions. VSS at discharge. IV removed per order. Pt belongs sent with patient. RN discharge patient via wheelchair to [employer] entrance to private vehicle.

## 2020-05-30 NOTE — Progress Notes (Signed)
      [address]       [place] [address]             [phone]       1 Day Post-Op Procedure(s) (LRB): ESOPHAGOGASTRODUODENOSCOPY (EGD) (N/A) ESOPHAGEAL STENT REMOVAL (N/A)  Subjective: Patient without specific complaint this am. He thinks he might have to use the bathroom. He denies abdominal pain or nausea.  Objective: Vital signs in last 24 hours: Temp:  [97.4 F (36.3 C)-98.1 F (36.7 C)] 97.7 F (36.5 C) (01/25 0302) Pulse Rate:  [60-91] 72 (01/25 0302) Cardiac Rhythm: Normal sinus rhythm (01/25 0302) Resp:  [13-23] 18 (01/25 0302) BP: (97-113)/(52-72) 98/64 (01/25 0302) SpO2:  [90 %-100 %] 95 % (01/25 0302) Weight:  [83 kg] 83 kg (01/24 0814)     Intake/Output from previous day: 01/24 0701 - 01/25 0700 In: 1486 [I.V.:600; NG/GT:886] Out: 1200 [Urine:1200]   Physical Exam:  Cardiovascular: RRR Pulmonary: Clear to auscultation bilaterally Abdomen: Soft, non tender, bowel sounds present. Extremities: No lower extremity edema. Wounds: Clean and dry.  No erythema or signs of infection. Blake drain to JP bulb: milky drainage  Lab Results: PPJ:KDTOIZ Labs    05/29/20 0912  WBC 5.2  HGB 10.6*  HCT 33.5*  PLT 308   BMET:  Recent Labs    05/29/20 0912  NA 137  K 4.4  CL 100  CO2 23  GLUCOSE 104*  BUN 9  CREATININE 0.55*  CALCIUM 9.3    PT/INR: No results for input(s): LABPROT, INR in the last 72 hours. ABG:  INR: Will add last result for INR, ABG once components are confirmed Will add last 4 CBG results once components are confirmed  Assessment/Plan:  1. CV - SR with HR in the 60-70's. On  2.  Pulmonary - On room air. Encourage incentive spirometer. 50 cc of milky drainage since Sunday at 6 pm from left [place] drain. 3. GI-nocturnal tube feeds, Boost. 4. Anemia-H and H 10.6 and 33.5 5. Await esophagram's result and if stable, will discharge later today  Brett Dawson M ZimmermanPA-C 05/30/2020,7:22 AM [phone]

## 2020-05-30 NOTE — Discharge Instructions (Signed)
Dysphagia I diet: some of which include the following  Pureed breads (also called pre-gelled breads) Smooth puddings, custards, yogurts, and pureed desserts. Pureed fruits and well-mashed bananas. Pureed meats. Souffles. Well-moistened mashed potatoes. Pureed soups. Pureed vegetables without lumps, chunks, or seeds.

## 2020-05-30 NOTE — Anesthesia Postprocedure Evaluation (Signed)
Anesthesia Post Note  Patient: RACIEL CAFFREY  Procedure(s) Performed: ESOPHAGOGASTRODUODENOSCOPY (EGD) (N/A ) ESOPHAGEAL STENT REMOVAL (N/A )     Patient location during evaluation: PACU Anesthesia Type: General Level of consciousness: awake and alert Pain management: pain level controlled Vital Signs Assessment: post-procedure vital signs reviewed and stable Respiratory status: spontaneous breathing, nonlabored ventilation, respiratory function stable and patient connected to nasal cannula oxygen Cardiovascular status: blood pressure returned to baseline and stable Postop Assessment: no apparent nausea or vomiting Anesthetic complications: no   No complications documented.  Last Vitals:  Vitals:   05/30/20 0302 05/30/20 0758  BP: 98/64 108/69  Pulse: 72 73  Resp: 18 16  Temp: 36.5 C 36.6 C  SpO2: 95% 95%    Last Pain:  Vitals:   05/30/20 0758  TempSrc: Oral  PainSc: 0-No pain                 Catalina Gravel

## 2020-05-30 NOTE — Discharge Summary (Signed)
Physician Discharge Summary       [address]       [place] [address]             [phone]    Patient ID: Brett Dawson MRN: [id number] DOB/AGE: 1956-01-13 65 y.o.  Admit date: 05/29/2020 Discharge date: 05/30/2020  Admission Diagnoses: 1. History of minimally invasive Ivor Lewis esophagectomy 2. Anastomotic leak 3. Status post esophageal stent placement  Discharge Diagnoses:  1. S/p esophagogastroscopy, esophageal stent removal, and esophagram with Omnipaque  2. History of esophageal cancer 3. History of malnutrition 4. History of expected acute blood loss anemia 5. History of post-operative thrombocytopenia with resolution 6. History of left chylothorax 7. History of gout  Consults: None  Procedure (s):  -Esophagogastroscopy - esophageal stent removal - Esophagram with Omnipaque by Dr. Kipp Brood on 05/29/2020.  History of Presenting Illness: [name] was last seen in the office by Dr. Kipp Brood o 05/26/2020.  Overall, he is doing well.  He has been tolerating his J-tube feeds and clear liquids.  He has gained 2 pounds since his last visit.  He has had minimal drainage from his chest tube.  Dr. Kipp Brood discussed the risks and benefits of an upper endoscopy with stent removal.  He discussed he would perform an on table esophagram and admit him to the hospital following this.  He will then undergo formal esophagram.  If this is negative, then he will remove his chest tube and advance his diet. Potential risks, benefits, and complications of the surgery were discussed with the patient and he agreed to proceed with surgery. He was admitted on 05/29/2020 where he underwent an esophagogastroscopy, esophageal stent removal, and esophagram with Omnipaque.   Brief Hospital Course:  An esophagram was done 01/25 which showed post esophagectomy with gastroesophageal anastomosis showing caliber transition and mild irregularity but no sign of leak. It also  showed emptying of the intrathoracic stomach into the proximal small bowel with some stasis in the stomach. Per Dr. Kipp Brood, left chest drain to be removed. Patient will be put on a dysphagia I diet. Patient was felt surgically stable for discharge today.   Latest Vital Signs: Blood pressure 108/80, pulse 77, temperature 98.3 F (36.8 C), temperature source Oral, resp. rate 16, height 5\' 11"  (1.803 m), weight 83 kg, SpO2 97 %.  Physical Exam: Cardiovascular: RRR Pulmonary: Clear to auscultation bilaterally Abdomen: Soft, non tender, bowel sounds present. Extremities: No lower extremity edema. Wounds: Clean and dry.  No erythema or signs of infection.  Discharge Condition:Stable and discharged to home.  Recent laboratory studies:  Lab Results  Component Value Date   WBC 5.2 05/29/2020   HGB 10.6 (L) 05/29/2020   HCT 33.5 (L) 05/29/2020   MCV 91.5 05/29/2020   PLT 308 05/29/2020   Lab Results  Component Value Date   NA 137 05/29/2020   K 4.4 05/29/2020   CL 100 05/29/2020   CO2 23 05/29/2020   CREATININE 0.55 (L) 05/29/2020   GLUCOSE 104 (H) 05/29/2020      Diagnostic Studies: DG Chest 1 View  Result Date: 05/29/2020 CLINICAL DATA:  Removal of esophageal stent.  Esophageal carcinoma EXAM: CHEST  1 VIEW COMPARISON:  Chest radiograph May 29, 2020 FLUOROSCOPY TIME:  0 minutes 53 seconds; 10.97 mGy FINDINGS: A series of images obtained show interval endoscopic removal of esophageal stent. Contrast administration shows areas of mucosal irregularity in the esophagus. There is contrast which appears to remain in the wall of the esophagus  on the right without frank extravasation. Surrounding lung parenchyma appears unremarkable. Heart size normal. IMPRESSION: Interval removal of esophageal stent. On frontal view, there is suggestion of a linear focus of contrast which may reside within the wall of the esophagus on the right. There is no gross extravasation of contrast in this area.  Mucosal irregularity noted in the esophagus. It may be reasonable to consider noncontrast chest CT to further evaluate the rightward aspect of the esophageal wall given what appears to be contrast remaining in this area. These results will be called to the ordering clinician or representative by the Radiologist Assistant, and communication documented in the PACS or [employer]. Electronically Signed   By: Lowella Grip III M.D.   On: 05/29/2020 12:15   DG Chest 2 View  Result Date: 05/29/2020 CLINICAL DATA:  Preop evaluation for upcoming stent removal EXAM: CHEST - 2 VIEW COMPARISON:  04/19/2020 FINDINGS: Cardiac shadow is within normal limits. Left-sided chest tube is again noted. Small bilateral pleural effusions are again seen and stable. No pneumothorax is noted. Esophageal stent and changes of prior embolotherapy are seen. Mild bibasilar atelectasis is again noted. No new focal abnormality is seen. IMPRESSION: Stable appearance of the chest when compared with the prior exam. No new focal abnormality is seen. Electronically Signed   By: Inez Catalina M.D.   On: 05/29/2020 08:31   DG C-Arm 1-60 Min  Result Date: 05/29/2020 : Chest radiograph and C-arm report are combined into a single dictation. Electronically Signed   By: Lowella Grip III M.D.   On: 05/29/2020 12:15   DG ESOPHAGUS W SINGLE CM (SOL OR THIN BA)  Result Date: 05/30/2020 CLINICAL DATA:  History of esophageal/esophagogastric leak following esophagectomy. EXAM: ESOPHOGRAM/BARIUM SWALLOW TECHNIQUE: Single contrast examination was performed using water-soluble and thin barium contrast material. FLUOROSCOPY TIME:  Fluoroscopy Time:  3 minutes 56 seconds Radiation Exposure Index (if provided by the fluoroscopic device): 57.6 mGy Number of Acquired Spot Images: 6 COMPARISON:  Intraoperative esophagram acquired the same date. FINDINGS: Scout images obtained show suture line along the RIGHT margin of the intrathoracic stomach. Post  embolization changes below the thoracic inlet and dense material along the LEFT margin of the stomach representing the embolized thoracic duct. LEFT-sided thoracostomy tube in place. Leads project over the anterior abdomen and chest. Water-soluble contrast was first administered and multiple swallows showing mild irregularity at the anastomotic site in the upper chest just below the clavicular heads. No sign of extravasation of contrast material. Imaging performed in the RPO and LPO as well as AP positions with head of bed at approximately 30 degrees to challenge the area and then repeated in supine position after filling of the intrathoracic stomach. Thin barium was administered with similar appearance. The stomach emptied into the small bowel without significant difficulty. IMPRESSION: 1. Post esophagectomy with gastroesophageal anastomosis showing caliber transition and mild irregularity but no sign of leak. 2. Emptying of the intrathoracic stomach into the proximal small bowel with some stasis in the stomach on the current study. Electronically Signed   By: Zetta Bills M.D.   On: 05/30/2020 11:48         Discharge Medications: Allergies as of 05/30/2020      Reactions   Bee Venom Swelling   Strawberry Extract Other (See Comments)      Medication List    STOP taking these medications   Colchicine 0.6 MG Caps   famotidine 40 MG/5ML suspension Commonly known as: Pepcid   guaiFENesin  100 MG/5ML Soln Commonly known as: ROBITUSSIN   omeprazole 20 MG capsule Commonly known as: PRILOSEC     TAKE these medications   acetaminophen 160 MG/5ML solution Commonly known as: TYLENOL Place 20.3 mLs (650 mg total) into feeding tube every 4 (four) hours as needed for fever (fever >100.5).   feeding supplement (OSMOLITE 1.5 CAL) Liqd Place 1,760 mLs into feeding tube daily.   feeding supplement (PROSource TF) liquid Place 45 mLs into feeding tube 3 (three) times daily.   fluticasone 50  MCG/ACT nasal spray Commonly known as: FLONASE Place 2 sprays into both nostrils daily.   free water Soln Place 75 mLs into feeding tube every 4 (four) hours.       Follow Up Appointments:  Follow-up Information    Lajuana Matte, MD. Go on 06/09/2020.   Specialty: Cardiothoracic Surgery Why: Appointment time is at 10:45 am Contact information: [place] [address] [phone]               Signed: Sharalyn Ink [facility] 05/30/2020, 1:32 PM

## 2020-06-01 ENCOUNTER — Other Ambulatory Visit: Payer: Self-pay | Admitting: *Deleted

## 2020-06-01 ENCOUNTER — Telehealth: Payer: Self-pay | Admitting: General Practice

## 2020-06-01 ENCOUNTER — Encounter: Payer: Self-pay | Admitting: Licensed Clinical Social Worker

## 2020-06-01 DIAGNOSIS — Z434 Encounter for attention to other artificial openings of digestive tract: Secondary | ICD-10-CM | POA: Insufficient documentation

## 2020-06-01 DIAGNOSIS — C160 Malignant neoplasm of cardia: Secondary | ICD-10-CM

## 2020-06-01 DIAGNOSIS — C155 Malignant neoplasm of lower third of esophagus: Secondary | ICD-10-CM

## 2020-06-01 DIAGNOSIS — D630 Anemia in neoplastic disease: Secondary | ICD-10-CM

## 2020-06-01 DIAGNOSIS — Z9889 Other specified postprocedural states: Secondary | ICD-10-CM | POA: Insufficient documentation

## 2020-06-01 DIAGNOSIS — K219 Gastro-esophageal reflux disease without esophagitis: Secondary | ICD-10-CM

## 2020-06-01 DIAGNOSIS — Z9049 Acquired absence of other specified parts of digestive tract: Secondary | ICD-10-CM | POA: Insufficient documentation

## 2020-06-01 NOTE — Progress Notes (Signed)
[place] Clinical Social Work  Clinical Social Worker contacted Learta Codding, [facility] RN care manager by phone per VM request. Per Maudie Mercury, patient is struggling financially after being out of work due to hospitalizations and surgeries for his cancer. As patient is currently in surveillance, it is unlikely that there will be cancer-specific funds available to assist.  CSW attempted to contact patient to explore other potential options for community support. No answer. Left VM with contact information.      [place], [place] Worker [employer]

## 2020-06-01 NOTE — Patient Outreach (Signed)
[facility]) Care Management  06/01/2020  Brett Dawson [birth year] [id number]  [facility] outreach to complex care patient  Brett Dawson [facility] Reason:esophageal tumor with invasion into cradia  Admissions x3in last 6 months 05/29/20-05/30/20 s/p esophagogastroscopy, esophageal stent removal and esophagram with omnipaque by Dr Kipp Brood on 05/29/20 history of minimally invasive Ivor Lewis esophagectomy, anastomotic leak, Status post esophageal stent placement  10/21/21admission for robotic assisted Ivor-Lewis esophagectomy with jejunostomy tube placement by Dr Kipp Brood -discharged from [place] 04/21/20 home with advanced home health infusions, remote health services - J tube in place 11/23/19 EGD with upper endoscopy Dr [name] 7/16/21AnotherFeraheme dosegiven Pre referralAdmission5/14/21 to 09/19/19 syncopeHgb6.7 given blood+Ferahemeon 09/18/19 dxsystemic iron deficiency anemia observed and Hgb increased to 7.7 followed by [facility] gastroenterology  Transition of care services noted to be completed by primary care MD office staff- Dr Matilde Haymaker Transition of Care will be completed by primary care provider office who will refer to [facility] care management if needed.   Attempt to reach Dr Ernestina Penna  SW Vernie Shanks and Lelon Frohlich)  without success x 1 Second attempt to outreach to oncology SW for patient Left a voice message for the oncology social workers (SW), [place] and [place] Unsuccessful outreach Outreach attempt to the home number  No answer. [facility] RN CM left HIPAA [facility] Portability and Accountability Act) compliant voicemail message along with CM's contact info.      Plan: [facility] RN CM scheduled this patient for another call attempt within 4-7 business days  Mallie Linnemann L. Lavina Hamman, RN, BSN, [place] Coordinator Office number [phone] Mobile number [phone]  Main THN number [phone] Fax number [phone]

## 2020-06-01 NOTE — Patient Outreach (Signed)
[facility]) Care Management  06/01/2020  DIONISIO ARAGONES 08-25-1955 [id number]   [place] coordination- collaboration with oncology SW/care guide referral  [facility] RN CM received a call from [place], oncology social work (SW)  Sharyn Lull reports limited resources to assist patient  [facility] RN CM discussed pt financial voiced concerns and answered questions    Plan [facility] RN CM referred Mr Belling to care guide for possible financial resources  Pt has been out of work since 02/24/20 admission for robotic assisted Ivor-Lewis esophagectomy with jejunostomy tube placement by Dr Kipp Brood -discharged from [place] 04/21/20  05/29/20-05/30/20 s/p esophagogastroscopy, esophageal stent removal and esophagram with omnipaque by Dr Kipp Brood    Joelene Millin L. Lavina Hamman, RN, BSN, [place] Coordinator Office number [phone] Mobile number [phone]  Main THN number [phone] Fax number [phone]

## 2020-06-01 NOTE — Telephone Encounter (Signed)
[place] CSW Progress Notes  VM from [place], wants oncology SW to speak w patient about any available financial support resources to patients with his diagnosis.  As undersigned CSW is in training/on PAL, asked SW team at [facility] to respond.  Edwyna Shell, LCSW Clinical Social Worker Phone:  [phone]

## 2020-06-02 ENCOUNTER — Telehealth: Payer: Self-pay

## 2020-06-02 NOTE — Telephone Encounter (Signed)
Nutrition  Received message back from DME and Two Cal shipped today and should be delivered to patient on Monday.    Called and spoke with patient and he is aware of new formula coming.    Brett Dawson, [place], [place] Registered Dietitian [phone] (mobile)

## 2020-06-02 NOTE — Telephone Encounter (Signed)
Nutrition Follow-up:  Patient with esophageal cancer s/p neoadjuvant chemotherapy and radiation therapy (01/13/20).  Esophageal stent placed and since removed on 1/24.  Patient with J-tube  Patient called RD as was experiencing a lot of gas yesterday but has resolved today.  Had bowel movement yesterday times 2 but prior to that no bowel movement until 1/25 (following procedure).  Reports that he has eaten oatmeal, grits since being home.  In hospital ate puree green peas, mashed potatoes and puree meat with pudding.   Patient reports that he is giving 5 cartons of osmolite 1.5 via feeding tube at night.    Notes from PA reviewed on 1/25 and noted dysphagia 1 diet for discharge   INTERVENTION:  Educated patient on puree diets and foods appropriate on puree diet. Discussed foods to avoid that could cause gas.  Also encouraged patient to avoid using straws, carbonated beverages and chewing gum as can increase gas. If gas continued concern encouraged patient to call surgeon's office for further guidance.   Continue tube feeding at least 5 cartons per day for nutrition as oral intake not meeting nutritional needs at this time. Patient continues on osmolite 1.5 has not gotten another formula (ie TwoCal).   Message sent to home health RD regarding availability of Two Cal.  Patient has contact informaiton     MONITORING, EVALUATION, GOAL: weight trends, intake, tube feeding   NEXT VISIT: Feb 8 phone f/u  Jenell Dobransky B. Zenia Resides, [place], [place] Registered Dietitian [phone] (mobile)

## 2020-06-07 ENCOUNTER — Telehealth: Payer: Self-pay

## 2020-06-07 ENCOUNTER — Telehealth: Payer: Self-pay | Admitting: Family Medicine

## 2020-06-07 ENCOUNTER — Encounter: Payer: Self-pay | Admitting: *Deleted

## 2020-06-07 NOTE — Telephone Encounter (Addendum)
Nutrition Follow-up:  Communication received from nursing from surgeon regarding patient with gas and bloating.   Spoke with patient via phone.  Patient has not been eating any solid foods by mouth since last spoke with RD on 1/28 except soft banana and drinking liquids (milky boost shake/premier protein shake (160-240 calories, 20-30 g protein). Only drinking about 1 shake per day.  Reports gas and bloating.  Reports during the night last night had bowel movement, small amount of liquid after passing gas. Will often go to bathroom thinking he needs to have bowel movement but is just gas.  Patient using osmolite 1.5, 5 cartons per day via J-tube.  Has previously been tolerating feeding prior to stent removal on 1/24.  Patient has received TwoCal but has not started as trying to use up osmolite 1.5.  Patient is not taking pepcid as does not have the money to pay for it.  States that he hopes he has enough money for rent this month.  Patient says that he is waiting on call back from surgeon's office regarding what he can take for gas.      Medications: reviewed  Labs: no new  Anthropometrics:   No new weight   NUTRITION DIAGNOSIS: Inadequate oral intake continues relying on tube feeding   INTERVENTION:  Recommend to continue tube feeding as patient only taking 5 cartons of tube feeding (less than recommended 7 cartons of osmolite 1.5).  Patient will transition to TwoCal once runs out of osmolite 1.5.  Would not recommend reducing tube feeding as currently not taking prescribed amount and oral intake not adequate. Reviewed puree diet with patient, low fiber, less gas producing foods until upgraded by surgeon. Encouraged small frequent meals Cancer center will offer complimentary case of ensure for patient to pick up on Friday, Feb 4th.   Communication regarding nutritional status/recommendations sent back to RN to discuss with surgeon. Awaiting thoughts on surgeon's input regarding medication  for symptoms.  Contacted cancer center Clinical Social Work team to see if any resources available to help patient. Options are limited as patient not receiving treatment at the cancer center.       MONITORING, EVALUATION, GOAL: weight trends, intake, tube feeding   NEXT VISIT: Feb 8 phone call  Cesily Cuoco B. Zenia Resides, [place], [place] Registered Dietitian [phone] (mobile)

## 2020-06-07 NOTE — Progress Notes (Signed)
[place] Work  Clinical Social Work contacted patient at home after request from [place] for financial resources for his diagnosis.  CSW contacted patient at home to offer support and assess for needs.  Patient stated he was unable to afford medications prescribed by providers outside of [facility].  Patient stated these were mediations to help side effects of tube feedings.  Patient is not in active treatment and in surveillance, and unfortunately resources are limited and no [facility] specific funds are available.  CSW informed patient that CSW would contact him if any additional resources are identified.  Johnnye Lana, MSW, LCSW, OSW-C Clinical Social Worker [facility] [phone]

## 2020-06-07 NOTE — Telephone Encounter (Signed)
   Telephone encounter was:  Successful.  06/07/2020 Name: [name] MRN: [id number] DOB: 1956-01-15  Brett Dawson is a 65 y.o. year old male who is a primary care patient of [name], MD . The community resource team was consulted for assistance with Financial Difficulties related to having cancer, but now it's gone but cannot go back to work because he needs to get his strength back from the treatments.   Care guide performed the following interventions: Patient provided with information about care guide support team and interviewed to confirm resource needs Discussed resources to assist with financial difficulties. He's been denied disability and wants to go back to work but needs to gain his strength back. Has to wait until the doctor can clear him and he is running out of his savings as of this month. .  Follow Up Plan:  Care guide will follow up with patient by phone over the next week and Care guide will outreach resources to assist patient with finanical strains  [place], [place], Care Management Phone: [phone] Email: [email or web address]

## 2020-06-09 ENCOUNTER — Telehealth: Payer: Self-pay | Admitting: Family Medicine

## 2020-06-09 ENCOUNTER — Ambulatory Visit: Payer: Self-pay | Admitting: Thoracic Surgery (Cardiothoracic Vascular Surgery)

## 2020-06-09 NOTE — Telephone Encounter (Signed)
   Telephone encounter was:  Successful.  06/09/2020 Name: Brett Dawson MRN: [id number] DOB: 15-Feb-1956  Brett Dawson is a 65 y.o. year old male who is a primary care patient of [name], MD . The community resource team was consulted for assistance with sent a referral to the [facility] to see if they can help with disability services.   Care guide performed the following interventions: Placed referral to [facility] with [facility] via email. .  Follow Up Plan:  Care guide will follow up with patient by phone over the next week  [place], Care Management Phone: [phone] Email: [email or web address]

## 2020-06-12 ENCOUNTER — Other Ambulatory Visit: Payer: Self-pay | Admitting: *Deleted

## 2020-06-12 NOTE — Patient Outreach (Signed)
[facility]) Care Management  06/12/2020  Brett Dawson 13-Sep-1955 [id number]   [facility] second Unsuccessful outreach to complex care patient  Mr Brett Dawson was referred to [facility] on 11/24/19 by [facility] Referral Reason:esophageal tumor with invasion into cradia  Admissions x3in last 6 months 05/29/20-05/30/20 s/p esophagogastroscopy, esophageal stent removal and esophagram with omnipaque by Dr Kipp Brood on 05/29/20 history of minimally invasive Ivor Lewis esophagectomy, anastomotic leak, Status post esophageal stent placement  10/21/21admission for robotic assisted Ivor-Lewis esophagectomy with jejunostomy tube placement by Dr Kipp Brood -discharged from [place] 04/21/20 home with advanced home health infusions, remote health services - J tube in place 11/23/19 EGD with upper endoscopy Dr [name] 7/16/21AnotherFeraheme dosegiven Pre referralAdmission5/14/21 to 09/19/19 syncopeHgb6.7 given blood+Ferahemeon 09/18/19 dxsystemic iron deficiency anemia observed and Hgb increased to 7.7 followed by [facility] gastroenterology  Transition of care services noted to be completed by primary care MD office staff- Dr Matilde Haymaker Transition of Care will be completed by primary care provider office who will refer to [facility] care management if needed.   Unsuccessful second call attempt Outreach attempt to the home number  No answer. [facility] RN CM left HIPAA [facility] Portability and Accountability Act) compliant voicemail message along with CM's contact info.   Plan: [facility] RN CM scheduled this patient for another call attempt within 4-7 business days Unsuccessful outreach attempts on 06/01/20 & 06/12/20 Sent [facility] unsuccessful outreach letter  [place]. Lavina Hamman, RN, BSN, [place] Coordinator Office number [phone] Mobile number [phone]  Main [facility] number [phone] Fax number [phone]

## 2020-06-13 ENCOUNTER — Other Ambulatory Visit: Payer: Self-pay

## 2020-06-13 ENCOUNTER — Other Ambulatory Visit: Payer: Self-pay | Admitting: Surgical

## 2020-06-13 ENCOUNTER — Inpatient Hospital Stay: Payer: 59

## 2020-06-13 ENCOUNTER — Encounter (HOSPITAL_COMMUNITY): Payer: Self-pay | Admitting: Thoracic Surgery (Cardiothoracic Vascular Surgery)

## 2020-06-13 ENCOUNTER — Other Ambulatory Visit: Payer: Self-pay | Admitting: *Deleted

## 2020-06-13 ENCOUNTER — Ambulatory Visit
Admission: RE | Admit: 2020-06-13 | Discharge: 2020-06-13 | Disposition: A | Discharge status: Home / Self Care | Payer: 59 | Source: Ambulatory Visit | Attending: Surgical | Admitting: Surgical

## 2020-06-13 ENCOUNTER — Inpatient Hospital Stay (HOSPITAL_COMMUNITY): Payer: 59

## 2020-06-13 ENCOUNTER — Ambulatory Visit (INDEPENDENT_AMBULATORY_CARE_PROVIDER_SITE_OTHER): Payer: Self-pay | Admitting: Surgical

## 2020-06-13 ENCOUNTER — Inpatient Hospital Stay (HOSPITAL_COMMUNITY)
Admission: AD | Admit: 2020-06-13 | Discharge: 2020-06-23 | DRG: 862 | Disposition: A | Discharge status: Home / Self Care | Payer: 59 | Source: Ambulatory Visit | Attending: Thoracic Surgery (Cardiothoracic Vascular Surgery) | Admitting: Thoracic Surgery (Cardiothoracic Vascular Surgery)

## 2020-06-13 VITALS — BP 78/48 | HR 114 | Temp 97.7°F | Resp 22 | Wt 170.0 lb

## 2020-06-13 DIAGNOSIS — Z8501 Personal history of malignant neoplasm of esophagus: Secondary | ICD-10-CM

## 2020-06-13 DIAGNOSIS — J939 Pneumothorax, unspecified: Secondary | ICD-10-CM

## 2020-06-13 DIAGNOSIS — J90 Pleural effusion, not elsewhere classified: Secondary | ICD-10-CM | POA: Diagnosis present

## 2020-06-13 DIAGNOSIS — Y848 Other medical procedures as the cause of abnormal reaction of the patient, or of later complication, without mention of misadventure at the time of the procedure: Secondary | ICD-10-CM | POA: Diagnosis present

## 2020-06-13 DIAGNOSIS — Z79899 Other long term (current) drug therapy: Secondary | ICD-10-CM

## 2020-06-13 DIAGNOSIS — K9189 Other postprocedural complications and disorders of digestive system: Secondary | ICD-10-CM

## 2020-06-13 DIAGNOSIS — Z91030 Bee allergy status: Secondary | ICD-10-CM | POA: Diagnosis not present

## 2020-06-13 DIAGNOSIS — L24A9 Irritant contact dermatitis due friction or contact with other specified body fluids: Secondary | ICD-10-CM

## 2020-06-13 DIAGNOSIS — T148XXA Other injury of unspecified body region, initial encounter: Secondary | ICD-10-CM | POA: Diagnosis present

## 2020-06-13 DIAGNOSIS — Z934 Other artificial openings of gastrointestinal tract status: Secondary | ICD-10-CM

## 2020-06-13 DIAGNOSIS — T8141XA Infection following a procedure, superficial incisional surgical site, initial encounter: Secondary | ICD-10-CM | POA: Diagnosis present

## 2020-06-13 DIAGNOSIS — E43 Unspecified severe protein-calorie malnutrition: Secondary | ICD-10-CM | POA: Diagnosis present

## 2020-06-13 DIAGNOSIS — M109 Gout, unspecified: Secondary | ICD-10-CM | POA: Diagnosis present

## 2020-06-13 DIAGNOSIS — L089 Local infection of the skin and subcutaneous tissue, unspecified: Secondary | ICD-10-CM | POA: Diagnosis present

## 2020-06-13 DIAGNOSIS — I440 Atrioventricular block, first degree: Secondary | ICD-10-CM | POA: Diagnosis present

## 2020-06-13 DIAGNOSIS — Z938 Other artificial opening status: Secondary | ICD-10-CM

## 2020-06-13 DIAGNOSIS — D509 Iron deficiency anemia, unspecified: Secondary | ICD-10-CM | POA: Diagnosis present

## 2020-06-13 DIAGNOSIS — D62 Acute posthemorrhagic anemia: Secondary | ICD-10-CM | POA: Diagnosis not present

## 2020-06-13 DIAGNOSIS — J9811 Atelectasis: Secondary | ICD-10-CM | POA: Diagnosis present

## 2020-06-13 DIAGNOSIS — J869 Pyothorax without fistula: Secondary | ICD-10-CM | POA: Diagnosis present

## 2020-06-13 DIAGNOSIS — T8149XA Infection following a procedure, other surgical site, initial encounter: Secondary | ICD-10-CM

## 2020-06-13 DIAGNOSIS — Z91018 Allergy to other foods: Secondary | ICD-10-CM

## 2020-06-13 DIAGNOSIS — Z6824 Body mass index (BMI) 24.0-24.9, adult: Secondary | ICD-10-CM

## 2020-06-13 DIAGNOSIS — B964 Proteus (mirabilis) (morganii) as the cause of diseases classified elsewhere: Secondary | ICD-10-CM | POA: Diagnosis present

## 2020-06-13 DIAGNOSIS — K5900 Constipation, unspecified: Secondary | ICD-10-CM | POA: Diagnosis present

## 2020-06-13 LAB — APTT: aPTT: 32 seconds (ref 24–36)

## 2020-06-13 LAB — CBC WITH DIFFERENTIAL/PLATELET
Abs Immature Granulocytes: 0.06 10*3/uL (ref 0.00–0.07)
Basophils Absolute: 0 10*3/uL (ref 0.0–0.1)
Basophils Relative: 0 %
Eosinophils Absolute: 0 10*3/uL (ref 0.0–0.5)
Eosinophils Relative: 0 %
HCT: 32.6 % — ABNORMAL LOW (ref 39.0–52.0)
Hemoglobin: 11 g/dL — ABNORMAL LOW (ref 13.0–17.0)
Immature Granulocytes: 1 %
Lymphocytes Relative: 3 %
Lymphs Abs: 0.4 10*3/uL — ABNORMAL LOW (ref 0.7–4.0)
MCH: 29 pg (ref 26.0–34.0)
MCHC: 33.7 g/dL (ref 30.0–36.0)
MCV: 86 fL (ref 80.0–100.0)
Monocytes Absolute: 0.7 10*3/uL (ref 0.1–1.0)
Monocytes Relative: 6 %
Neutro Abs: 12 10*3/uL — ABNORMAL HIGH (ref 1.7–7.7)
Neutrophils Relative %: 90 %
Platelets: 468 10*3/uL — ABNORMAL HIGH (ref 150–400)
RBC: 3.79 MIL/uL — ABNORMAL LOW (ref 4.22–5.81)
RDW: 15.9 % — ABNORMAL HIGH (ref 11.5–15.5)
WBC: 13.1 10*3/uL — ABNORMAL HIGH (ref 4.0–10.5)
nRBC: 0 % (ref 0.0–0.2)

## 2020-06-13 LAB — COMPREHENSIVE METABOLIC PANEL
ALT: 75 U/L — ABNORMAL HIGH (ref 0–44)
AST: 49 U/L — ABNORMAL HIGH (ref 15–41)
Albumin: 2.3 g/dL — ABNORMAL LOW (ref 3.5–5.0)
Alkaline Phosphatase: 133 U/L — ABNORMAL HIGH (ref 38–126)
Anion gap: 13 (ref 5–15)
BUN: 11 mg/dL (ref 8–23)
CO2: 26 mmol/L (ref 22–32)
Calcium: 8.7 mg/dL — ABNORMAL LOW (ref 8.9–10.3)
Chloride: 94 mmol/L — ABNORMAL LOW (ref 98–111)
Creatinine, Ser: 0.65 mg/dL (ref 0.61–1.24)
GFR, Estimated: >60 mL/min (ref >60)
Glucose, Bld: 128 mg/dL — ABNORMAL HIGH (ref 70–99)
Potassium: 4 mmol/L (ref 3.5–5.1)
Sodium: 133 mmol/L — ABNORMAL LOW (ref 135–145)
Total Bilirubin: 1 mg/dL (ref 0.3–1.2)
Total Protein: 7.1 g/dL (ref 6.5–8.1)

## 2020-06-13 LAB — PROTIME-INR
INR: 1.4 — ABNORMAL HIGH (ref 0.8–1.2)
Prothrombin Time: 16.3 seconds — ABNORMAL HIGH (ref 11.4–15.2)

## 2020-06-13 MED ORDER — ACETAMINOPHEN 325 MG PO TABS
650.0000 mg | ORAL_TABLET | Freq: Four times a day (QID) | ORAL | Status: DC | PRN
Start: 2020-06-13 — End: 2020-06-15

## 2020-06-13 MED ORDER — SODIUM CHLORIDE 0.9% FLUSH: 3.0000 mL | INTRAVENOUS | Status: DC | PRN

## 2020-06-13 MED ORDER — SODIUM CHLORIDE 0.9% FLUSH
3.0000 mL | Freq: Two times a day (BID) | INTRAVENOUS | Status: DC
  Administered 2020-06-13 – 2020-06-22 (×17): 3 mL via INTRAVENOUS

## 2020-06-13 MED ORDER — ACETAMINOPHEN 650 MG RE SUPP: 650.0000 mg | Freq: Four times a day (QID) | RECTAL | Status: DC | PRN

## 2020-06-13 MED ORDER — PIPERACILLIN-TAZOBACTAM 3.375 G IVPB 30 MIN
3.3750 g | Freq: Once | INTRAVENOUS | Status: DC
  Filled 2020-06-13: qty 50

## 2020-06-13 MED ORDER — ENOXAPARIN SODIUM 40 MG/0.4ML ~~LOC~~ SOLN
40.0000 mg | SUBCUTANEOUS | Status: DC
  Administered 2020-06-13 – 2020-06-21 (×9): 40 mg via SUBCUTANEOUS
  Filled 2020-06-13 (×9): qty 0.4

## 2020-06-13 MED ORDER — MAGNESIUM HYDROXIDE 400 MG/5ML PO SUSP: 30.0000 mL | Freq: Every day | ORAL | Status: DC | PRN

## 2020-06-13 MED ORDER — IOHEXOL 300 MG/ML  SOLN
75.0000 mL | Freq: Once | INTRAMUSCULAR | Status: AC | PRN
  Administered 2020-06-13: 75 mL via INTRAVENOUS

## 2020-06-13 MED ORDER — SODIUM CHLORIDE 0.9 % IV SOLN: 250.0000 mL | INTRAVENOUS | Status: DC | PRN

## 2020-06-13 MED ORDER — VANCOMYCIN HCL 1250 MG/250ML IV SOLN
1250.0000 mg | Freq: Two times a day (BID) | INTRAVENOUS | Status: DC
  Administered 2020-06-14 – 2020-06-16 (×5): 1250 mg via INTRAVENOUS
  Filled 2020-06-13 (×5): qty 250

## 2020-06-13 MED ORDER — VANCOMYCIN HCL 1500 MG/300ML IV SOLN
1500.0000 mg | Freq: Once | INTRAVENOUS | Status: AC
  Administered 2020-06-13: 1500 mg via INTRAVENOUS
  Filled 2020-06-13: qty 300

## 2020-06-13 MED ORDER — PIPERACILLIN-TAZOBACTAM 3.375 G IVPB
3.3750 g | Freq: Three times a day (TID) | INTRAVENOUS | Status: DC
  Administered 2020-06-13 – 2020-06-16 (×8): 3.375 g via INTRAVENOUS
  Filled 2020-06-13 (×9): qty 50

## 2020-06-13 NOTE — Progress Notes (Signed)
Attempted to insert  peripheral line 2x but unsuccessful. Consulted IV team. Awaiting for them to come to insert iv line.

## 2020-06-13 NOTE — Patient Instructions (Signed)
admit

## 2020-06-13 NOTE — Progress Notes (Signed)
Pharmacy Antibiotic Note  Brett Dawson is a 65 y.o. male admitted on 06/13/2020 with sepsis.  Pharmacy has been consulted for vancomycin and zosyn dosing.  Had previously underwent minimally invasive esophagectomy - now having drainage from CT site that is purulent and bloody. Afebrile. WBC 13.1, Scr 0.65 (CrCl >100 mL/min).    Plan: Zosyn 3.375g IV q8h (4 hour infusion). Vancomycin 1500 mg IV once then 1250 mg IV every 12 hours (estAUC 512) Monitor renal fx, cx results, clinical pic, and vanc level as appropriate     Temp (24hrs), Avg:97.7 F (36.5 C), Min:97.7 F (36.5 C), Max:97.7 F (36.5 C)  No results for input(s): WBC, CREATININE, LATICACIDVEN, VANCOTROUGH, VANCOPEAK, VANCORANDOM, GENTTROUGH, GENTPEAK, GENTRANDOM, TOBRATROUGH, TOBRAPEAK, TOBRARND, AMIKACINPEAK, AMIKACINTROU, AMIKACIN in the last 168 hours.  Estimated Creatinine Clearance: 100.7 mL/min (A) (by C-G formula based on SCr of 0.55 mg/dL (L)).    Allergies  Allergen Reactions  . Bee Venom Swelling  . Strawberry Extract Other (See Comments)    Antimicrobials this admission: Zosyn 2/8 >>  Vancomycin 2/8 >>   Dose adjustments this admission: N/A  Microbiology results: 2/8 Wound Cx: pending   Thank you for allowing pharmacy to be a part of this patient's care.  Antonietta Jewel, PharmD, [place] Clinical Pharmacist  Phone: [phone] 06/13/2020 4:58 PM  Please check AMION for all [place] phone numbers After 10:00 PM, call [place] [phone]

## 2020-06-13 NOTE — H&P (Addendum)
HPI   History of Present Illness:    Patient is a 65 year old male well-known to [facility] having undergone minimally invasive Ivor Lewis esophagectomy.  He has had multiple postprocedural issues including anastomotic leak with placement of an esophageal stent.  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  The stent was subsequently removed on 05/29/2020 by Dr. Kipp Brood.  He has been on dysphagia 1 diet with jejunostomy tube feedings.  Yesterday he developed drainage from a previous chest tube site that was both purulent and bloody.  He has been having sweats but no chills and no definite fevers.  Chest x-ray reveals a left pleural effusion with bilateral airspace disease consistent with atelectasis or possible infiltrates.  This is not dramatically different from most recent chest x-ray.  Additionally he does have a significant amount of maceration around the J-tube site.  Due to these findings and clinical deterioration it is felt that he should be admitted for further evaluation to include CT scan of the chest.  We will also place him on intravenous antibiotics.       Past Medical History:  Diagnosis Date   Anemia     Anemia     Anginal pain ([place])      as a child   Dyspnea      due to weakness   Gout     History of blood transfusion     Malignant neoplasm of lower third of esophagus ([place]) 11/24/2019           Past Surgical History:  Procedure Laterality Date   BIOPSY   11/23/2019    Procedure: BIOPSY;  Surgeon: Arta Silence, MD;  Location: [facility];  Service: Endoscopy;;   ESOPHAGEAL STENT PLACEMENT N/A 04/11/2020    Procedure: ESOPHAGOGASTROSCOPY WITH ESOPHAGEAL STENT PLACEMENT;  Surgeon: Lajuana Matte, MD;  Location: [facility];  Service: Thoracic;  Laterality: N/A;   ESOPHAGEAL STENT PLACEMENT N/A 05/29/2020    Procedure: ESOPHAGEAL STENT REMOVAL;  Surgeon: Lajuana Matte, MD;  Location: [facility] OR;  Service: Thoracic;   Laterality: N/A;   ESOPHAGOGASTRODUODENOSCOPY N/A 02/24/2020    Procedure: ESOPHAGOGASTRODUODENOSCOPY (EGD);  Surgeon: Lajuana Matte, MD;  Location: [facility] OR;  Service: Thoracic;  Laterality: N/A;   ESOPHAGOGASTRODUODENOSCOPY N/A 05/29/2020    Procedure: ESOPHAGOGASTRODUODENOSCOPY (EGD);  Surgeon: Lajuana Matte, MD;  [facility] OR;  Service: Thoracic;  Laterality: N/A;   ESOPHAGOGASTRODUODENOSCOPY (EGD) WITH PROPOFOL N/A 11/23/2019    Procedure: ESOPHAGOGASTRODUODENOSCOPY (EGD) WITH PROPOFOL;  Surgeon: Arta Silence, MD;  Location: [facility];  Service: Endoscopy;  Laterality: N/A;   INTERCOSTAL NERVE BLOCK Right 02/24/2020    Procedure: INTERCOSTAL NERVE BLOCK;  Surgeon: Lajuana Matte, MD;  Location: [place];  Service: Thoracic;  Laterality: Right;   IR EMBO ART  VEN HEMORR LYMPH EXTRAV  INC GUIDE ROADMAPPING   04/07/2020   IR FLUORO GUIDED NEEDLE PLC ASPIRATION/INJECTION LOC   03/23/2020   IR LYMPHANGIOGRAM PEL/ABD BILAT   03/23/2020   IR LYMPHANGIOGRAM PEL/ABD BILAT   04/07/2020   IR US GUIDANCE   03/23/2020   IR US GUIDANCE   03/23/2020   IR US GUIDE VASC ACCESS LEFT   04/07/2020   IR US GUIDE VASC ACCESS RIGHT   04/07/2020   RADIOLOGY WITH ANESTHESIA N/A 03/23/2020    Procedure: Malignant neoplasm of esophagus;  Surgeon: Sandi Mariscal, MD;  Location: [facility];  Service: Radiology;  Laterality: N/A;   RADIOLOGY WITH ANESTHESIA N/A 04/07/2020  Procedure: IR WITH ANESTHESIA LYMPHANGIOGRAM BILATERAL WITH EMBOLIZATION;  Surgeon: Criselda Peaches, MD;  Location: [place] [facility];  Service: Radiology;  Laterality: N/A;   TONSILLECTOMY       VIDEO ASSISTED THORACOSCOPY (VATS)/EMPYEMA Left 04/11/2020    Procedure: LEFT VIDEO ASSISTED THORACOSCOPY (VATS) WITH IRRIGATION AND CHEST TUBE PLACEMENT;  Surgeon: Lajuana Matte, MD;  Location: [facility] OR;  Service: Thoracic;  Laterality: Left;           Family History  Problem Relation Age of Onset   Hyperlipidemia Mother        Social  History Social History         Tobacco Use   Smoking status: Never Smoker   Smokeless tobacco: Never Used  Vaping Use   Vaping Use: Former  Substance Use Topics   Alcohol use: Not Currently   Drug use: Yes      Types: Marijuana      Comment: last time 02/2020          Allergies  Allergen Reactions   Bee Venom Swelling   Strawberry Extract Other (See Comments)            Current Outpatient Medications  Medication Sig Dispense Refill   acetaminophen (TYLENOL) 160 MG/5ML solution Place 20.3 mLs (650 mg total) into feeding tube every 4 (four) hours as needed for fever (fever >100.5). 120 mL 0   famotidine (PEPCID) 40 MG/5ML suspension Take by mouth.       ferrous sulfate 325 (65 FE) MG tablet 1 tablet       fluticasone (FLONASE) 50 MCG/ACT nasal spray Place 2 sprays into both nostrils daily. 16 g 2   Nutritional Supplements (FEEDING SUPPLEMENT, OSMOLITE 1.5 CAL,) LIQD Place 1,760 mLs into feeding tube daily. 1760 mL 360   Nutritional Supplements (FEEDING SUPPLEMENT, PROSOURCE TF,) liquid Place 45 mLs into feeding tube 3 (three) times daily. 4050 mL 6   Water For Irrigation, Sterile (FREE WATER) SOLN Place 75 mLs into feeding tube every 4 (four) hours. 1000 mL 12    No current facility-administered medications for this visit.      Review of Systems Review of Systems  Constitutional: Positive for activity change, appetite change and fatigue.  HENT: Positive for rhinorrhea and trouble swallowing.   Eyes: Negative.   Respiratory: Positive for cough. Negative for shortness of breath.   Cardiovascular: Negative for chest pain, palpitations and leg swelling.  Gastrointestinal: Negative.   Genitourinary: Negative.   Musculoskeletal: Positive for myalgias.  Skin:The left-sided chest tube site is notable for a foul-smelling purulent drainage with minor erythema.  Additionally it is noted that the J-tube site has significant maceration with with erythema and some slightly purulent  drainage. Neurological: Positive for light-headedness.  Hematological: Negative.   Psychiatric/Behavioral: Negative.       There were no vitals taken for this visit.   Physical Exam Physical Exam Constitutional:      General: He is not in acute distress.    Appearance: He is ill-appearing and diaphoretic.  HENT:     Head: Normocephalic and atraumatic.     Nose: Rhinorrhea present. No congestion.     Mouth/Throat:     Mouth: Mucous membranes are moist.     Pharynx: No oropharyngeal exudate or posterior oropharyngeal erythema.  Eyes:     General: No scleral icterus.    Extraocular Movements: Extraocular movements intact.     Conjunctiva/sclera: Conjunctivae normal.  Cardiovascular:     Rate and Rhythm: Normal rate and regular  rhythm.  Musculoskeletal:        General: No swelling or deformity.     Cervical back: Neck supple. No rigidity.     Right lower leg: No edema.     Left lower leg: No edema.  Skin:    General: Skin is warm.     Capillary Refill: Capillary refill takes less than 2 seconds.     Coloration: Skin is not jaundiced.  Neurological:     General: No focal deficit present.     Mental Status: He is alert and oriented to person, place, and time.     Motor: Weakness present.  Psychiatric:        Mood and Affect: Mood normal.        Behavior: Behavior normal.        Thought Content: Thought content normal.        Data Reviewed DG Chest 2 View   Result Date: 06/13/2020 CLINICAL DATA:  Previous chest tube on the left, currently with drainage from this area EXAM: CHEST - 2 VIEW COMPARISON:  May 29, 2020 FINDINGS: Chest tube no longer evident on the left. There is a left pleural effusion with atelectatic change in each lower lung region. No consolidation. No pneumothorax. The heart size is normal. The pulmonary vascularity is normal. Postoperative changes noted with evidence of partial esophagectomy with gastric swingthrough procedure. Mild degenerative change in  the thoracic spine noted. IMPRESSION: No pneumothorax. Left pleural effusion noted. Bibasilar atelectasis. Heart size normal. Postoperative changes consistent with partial esophagectomy and gastric swingthrough procedure. Electronically Signed   By: Lowella Grip III M.D.   On: 06/13/2020 14:56      Assessment 1 former left-sided chest tube site with purulent and bloody drainage.  Additionally there is significant maceration and erythema associated with the jejunostomy site.   Plan Patient will be admitted for further evaluation and treatment to include chest CT scan, intravenous antibiotics, cultures and routine admission labs.       Gaspar Bidding 06/13/2020, 3:45 PM     Patient was seen and examined, along with Mr. Girtha Rm in the office. Admit for IV antibiotics. Will check with IR for possible drain placement  Fraidy Mccarrick C. Roxan Hockey, MD [facility] [phone]

## 2020-06-13 NOTE — Progress Notes (Signed)
Pt contacted the office stating he rolled over in bed last night and heard a pop. Blood/"pus" began to drain from an old chest tube site. Pt denies fever or warmth. He states the site looks red and irritated. Pt unable to send photo to nurse phone for evaluation. Appointment made for pt to be seen by a PA today with cxr. Pt states he will find a ride for the appointment.

## 2020-06-13 NOTE — Progress Notes (Signed)
[address]       [place] [address]             [phone]      Brett Dawson [facility] Medical Record #[id number] Date of Birth: October 17, 1955  Referring: [name], MD Primary Care: [name], MD Primary Cardiologist: No primary care provider on file.   Chief Complaint:   POST OP FOLLOW UP  History of Present Illness:    Patient is a 65 year old male well-known to [facility] having undergone minimally invasive Ivor Lewis esophagectomy.  He has had multiple postprocedural issues including anastomotic leak with placement of an esophageal stent.  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  The stent was subsequently removed on 05/29/2020 by Dr. Kipp Brood.  He has been on dysphagia 1 diet with jejunostomy tube feedings.  Yesterday he developed drainage from a previous chest tube site that was both purulent and bloody.  He has been having sweats but no chills and no definite fevers.  Chest x-ray reveals a left pleural effusion with bilateral airspace disease consistent with atelectasis or possible infiltrates.  This is not dramatically different from most recent chest x-ray.  Additionally he does have a significant amount of maceration around the J-tube site.  Due to these findings and clinical deterioration it is felt that he should be admitted for further evaluation to include CT scan of the chest.  We will also place him on intravenous antibiotics.      Past Medical History:  Diagnosis Date  . Anemia   . Anemia   . Anginal pain ([place])    as a child  . Dyspnea    due to weakness  . Gout   . History of blood transfusion   . Malignant neoplasm of lower third of esophagus ([place]) 11/24/2019     Social History   Tobacco Use  Smoking Status Never Smoker  Smokeless Tobacco Never Used    Social History   Substance and Sexual Activity  Alcohol Use Not Currently     Allergies  Allergen Reactions  .  Bee Venom Swelling  . Strawberry Extract Other (See Comments)    Current Outpatient Medications  Medication Sig Dispense Refill  . acetaminophen (TYLENOL) 160 MG/5ML solution Place 20.3 mLs (650 mg total) into feeding tube every 4 (four) hours as needed for fever (fever >100.5). 120 mL 0  . famotidine (PEPCID) 40 MG/5ML suspension Take by mouth.    . ferrous sulfate 325 (65 FE) MG tablet 1 tablet    . fluticasone (FLONASE) 50 MCG/ACT nasal spray Place 2 sprays into both nostrils daily. 16 g 2  . Nutritional Supplements (FEEDING SUPPLEMENT, OSMOLITE 1.5 CAL,) LIQD Place 1,760 mLs into feeding tube daily. 1760 mL 360  . Nutritional Supplements (FEEDING SUPPLEMENT, PROSOURCE TF,) liquid Place 45 mLs into feeding tube 3 (three) times daily. 4050 mL 6  . Water For Irrigation, Sterile (FREE WATER) SOLN Place 75 mLs into feeding tube every 4 (four) hours. 1000 mL 12   No current facility-administered medications for this visit.       Physical Exam: BP (!) 78/48 (BP Location: Left Arm, Patient Position: Sitting)   Pulse (!) 114   Temp 97.7 F (36.5 C)   Resp (!) 22   Wt 170 lb (77.1 kg)   SpO2 96% Comment: RA  BMI 23.71 kg/m   General appearance: alert, cooperative, no distress, mild distress and appears  acutely ill Heart: regular rate and rhythm Lungs: dim in bases Abdomen: soft, non-tender Extremities: no edema Wound: left chest tube site with some erethema and purulent drainage.    Diagnostic Studies & Laboratory data:     Recent Radiology Findings:   DG Chest 2 View  Result Date: 06/13/2020 CLINICAL DATA:  Previous chest tube on the left, currently with drainage from this area EXAM: CHEST - 2 VIEW COMPARISON:  May 29, 2020 FINDINGS: Chest tube no longer evident on the left. There is a left pleural effusion with atelectatic change in each lower lung region. No consolidation. No pneumothorax. The heart size is normal. The pulmonary vascularity is normal. Postoperative changes  noted with evidence of partial esophagectomy with gastric swingthrough procedure. Mild degenerative change in the thoracic spine noted. IMPRESSION: No pneumothorax. Left pleural effusion noted. Bibasilar atelectasis. Heart size normal. Postoperative changes consistent with partial esophagectomy and gastric swingthrough procedure. Electronically Signed   By: Lowella Grip III M.D.   On: 06/13/2020 14:56      Recent Lab Findings: Lab Results  Component Value Date   WBC 5.2 05/29/2020   HGB 10.6 (L) 05/29/2020   HCT 33.5 (L) 05/29/2020   PLT 308 05/29/2020   GLUCOSE 104 (H) 05/29/2020   TRIG 44 04/10/2020   ALT 13 05/29/2020   AST 32 05/29/2020   NA 137 05/29/2020   K 4.4 05/29/2020   CL 100 05/29/2020   CREATININE 0.55 (L) 05/29/2020   BUN 9 05/29/2020   CO2 23 05/29/2020   INR 1.3 (H) 04/07/2020   HGBA1C 5.6 05/21/2017      Assessment / Plan: Former chest tube site with purulent drainage and also some significant maceration and slight purulent drainage around the J tube site.  Will obtain cultures as well as chest CT with contrast.  Will start empirically on Vanco and Zosyn.  He will be admitted to the progressive unit when bed available.      Medication Changes: No orders of the defined types were placed in this encounter.     John Giovanni, PA-C 06/13/2020 3:09 PM

## 2020-06-13 NOTE — Progress Notes (Signed)
Patient ID: Brett Dawson, male   DOB: 17-Mar-1956, 65 y.o.   MRN: [id number]   HPI   History of Present Illness:    Patient is a 65 year old male well-known to [facility] having undergone minimally invasive Ivor Lewis esophagectomy.  He has had multiple postprocedural issues including anastomotic leak with placement of an esophageal stent.  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  The stent was subsequently removed on 05/29/2020 by Dr. Kipp Brood.  He has been on dysphagia 1 diet with jejunostomy tube feedings.  Yesterday he developed drainage from a previous chest tube site that was both purulent and bloody.  He has been having sweats but no chills and no definite fevers.  Chest x-ray reveals a left pleural effusion with bilateral airspace disease consistent with atelectasis or possible infiltrates.  This is not dramatically different from most recent chest x-ray.  Additionally he does have a significant amount of maceration around the J-tube site.  Due to these findings and clinical deterioration it is felt that he should be admitted for further evaluation to include CT scan of the chest.  We will also place him on intravenous antibiotics.  Past Medical History:  Diagnosis Date  . Anemia   . Anemia   . Anginal pain ([place])    as a child  . Dyspnea    due to weakness  . Gout   . History of blood transfusion   . Malignant neoplasm of lower third of esophagus ([place]) 11/24/2019    Past Surgical History:  Procedure Laterality Date  . BIOPSY  11/23/2019   Procedure: BIOPSY;  Surgeon: Arta Silence, MD;  Location: [facility];  Service: Endoscopy;;  . ESOPHAGEAL STENT PLACEMENT N/A 04/11/2020   Procedure: ESOPHAGOGASTROSCOPY WITH ESOPHAGEAL STENT PLACEMENT;  Surgeon: Lajuana Matte, MD;  Location: [facility];  Service: Thoracic;  Laterality: N/A;  . ESOPHAGEAL STENT PLACEMENT N/A 05/29/2020   Procedure: ESOPHAGEAL STENT REMOVAL;  Surgeon:  Lajuana Matte, MD;  Location: [place];  Service: Thoracic;  Laterality: N/A;  . ESOPHAGOGASTRODUODENOSCOPY N/A 02/24/2020   Procedure: ESOPHAGOGASTRODUODENOSCOPY (EGD);  Surgeon: Lajuana Matte, MD;  Location: [facility] OR;  Service: Thoracic;  Laterality: N/A;  . ESOPHAGOGASTRODUODENOSCOPY N/A 05/29/2020   Procedure: ESOPHAGOGASTRODUODENOSCOPY (EGD);  Surgeon: Lajuana Matte, MD;  Location: [facility];  Service: Thoracic;  Laterality: N/A;  . ESOPHAGOGASTRODUODENOSCOPY (EGD) WITH PROPOFOL N/A 11/23/2019   Procedure: ESOPHAGOGASTRODUODENOSCOPY (EGD) WITH PROPOFOL;  Surgeon: Arta Silence, MD;  Location: [facility];  Service: Endoscopy;  Laterality: N/A;  . INTERCOSTAL NERVE BLOCK Right 02/24/2020   Procedure: INTERCOSTAL NERVE BLOCK;  Surgeon: Lajuana Matte, MD;  Location: [facility];  Service: Thoracic;  Laterality: Right;  . IR EMBO ART  VEN HEMORR LYMPH EXTRAV  [place]  04/07/2020  . IR FLUORO GUIDED NEEDLE PLC ASPIRATION/INJECTION LOC  03/23/2020  . IR LYMPHANGIOGRAM PEL/ABD BILAT  03/23/2020  . IR LYMPHANGIOGRAM PEL/ABD BILAT  04/07/2020  . IR US GUIDANCE  03/23/2020  . IR US GUIDANCE  03/23/2020  . IR US GUIDE VASC ACCESS LEFT  04/07/2020  . IR US GUIDE VASC ACCESS RIGHT  04/07/2020  . RADIOLOGY WITH ANESTHESIA N/A 03/23/2020   Procedure: Malignant neoplasm of esophagus;  Surgeon: Sandi Mariscal, MD;  Location: [facility];  Service: Radiology;  Laterality: N/A;  . RADIOLOGY WITH ANESTHESIA N/A 04/07/2020   Procedure: IR WITH ANESTHESIA LYMPHANGIOGRAM BILATERAL WITH EMBOLIZATION;  Surgeon: Criselda Peaches, MD;  Location: [facility];  Service: Radiology;  Laterality: N/A;  . TONSILLECTOMY    . VIDEO ASSISTED THORACOSCOPY (VATS)/EMPYEMA Left 04/11/2020   Procedure: LEFT VIDEO ASSISTED THORACOSCOPY (VATS) WITH IRRIGATION AND CHEST TUBE PLACEMENT;  Surgeon: Lajuana Matte, MD;  Location: [facility] OR;  Service: Thoracic;  Laterality: Left;    Family History  Problem Relation Age of  Onset  . Hyperlipidemia Mother     Social History Social History   Tobacco Use  . Smoking status: Never Smoker  . Smokeless tobacco: Never Used  Vaping Use  . Vaping Use: Former  Substance Use Topics  . Alcohol use: Not Currently  . Drug use: Yes    Types: Marijuana    Comment: last time 02/2020    Allergies  Allergen Reactions  . Bee Venom Swelling  . Strawberry Extract Other (See Comments)    Current Outpatient Medications  Medication Sig Dispense Refill  . acetaminophen (TYLENOL) 160 MG/5ML solution Place 20.3 mLs (650 mg total) into feeding tube every 4 (four) hours as needed for fever (fever >100.5). 120 mL 0  . famotidine (PEPCID) 40 MG/5ML suspension Take by mouth.    . ferrous sulfate 325 (65 FE) MG tablet 1 tablet    . fluticasone (FLONASE) 50 MCG/ACT nasal spray Place 2 sprays into both nostrils daily. 16 g 2  . Nutritional Supplements (FEEDING SUPPLEMENT, OSMOLITE 1.5 CAL,) LIQD Place 1,760 mLs into feeding tube daily. 1760 mL 360  . Nutritional Supplements (FEEDING SUPPLEMENT, PROSOURCE TF,) liquid Place 45 mLs into feeding tube 3 (three) times daily. 4050 mL 6  . Water For Irrigation, Sterile (FREE WATER) SOLN Place 75 mLs into feeding tube every 4 (four) hours. 1000 mL 12   No current facility-administered medications for this visit.    Review of Systems Review of Systems  Constitutional: Positive for activity change, appetite change and fatigue.  HENT: Positive for rhinorrhea and trouble swallowing.   Eyes: Negative.   Respiratory: Positive for cough. Negative for shortness of breath.   Cardiovascular: Negative for chest pain, palpitations and leg swelling.  Gastrointestinal: Negative.   Genitourinary: Negative.   Musculoskeletal: Positive for myalgias.  Skin:         The left-sided chest tube site is notable for a foul-smelling purulent drainage with minor erythema.  Additionally it is noted that the J-tube site has significant maceration with with  erythema and some slightly purulent drainage.                                                                                                                                         Neurological: Positive for light-headedness.  Hematological: Negative.   Psychiatric/Behavioral: Negative.     There were no vitals taken for this visit.  Physical Exam Physical Exam Constitutional:      General: He is not in acute distress.    Appearance: He is ill-appearing and diaphoretic.  HENT:  Head: Normocephalic and atraumatic.     Nose: Rhinorrhea present. No congestion.     Mouth/Throat:     Mouth: Mucous membranes are moist.     Pharynx: No oropharyngeal exudate or posterior oropharyngeal erythema.  Eyes:     General: No scleral icterus.    Extraocular Movements: Extraocular movements intact.     Conjunctiva/sclera: Conjunctivae normal.  Cardiovascular:     Rate and Rhythm: Normal rate and regular rhythm.  Musculoskeletal:        General: No swelling or deformity.     Cervical back: Neck supple. No rigidity.     Right lower leg: No edema.     Left lower leg: No edema.  Skin:    General: Skin is warm.     Capillary Refill: Capillary refill takes less than 2 seconds.     Coloration: Skin is not jaundiced.  Neurological:     General: No focal deficit present.     Mental Status: He is alert and oriented to person, place, and time.     Motor: Weakness present.  Psychiatric:        Mood and Affect: Mood normal.        Behavior: Behavior normal.        Thought Content: Thought content normal.     Data Reviewed DG Chest 2 View  Result Date: 06/13/2020 CLINICAL DATA:  Previous chest tube on the left, currently with drainage from this area EXAM: CHEST - 2 VIEW COMPARISON:  May 29, 2020 FINDINGS: Chest tube no longer evident on the left. There is a  left pleural effusion with atelectatic change in each lower lung region. No consolidation. No pneumothorax. The heart size is normal. The pulmonary vascularity is normal. Postoperative changes noted with evidence of partial esophagectomy with gastric swingthrough procedure. Mild degenerative change in the thoracic spine noted. IMPRESSION: No pneumothorax. Left pleural effusion noted. Bibasilar atelectasis. Heart size normal. Postoperative changes consistent with partial esophagectomy and gastric swingthrough procedure. Electronically Signed   By: Lowella Grip III M.D.   On: 06/13/2020 14:56    Assessment 1 former left-sided chest tube site with purulent and bloody drainage.  Additionally there is significant maceration and erythema associated with the jejunostomy site.  Plan Patient will be admitted for further evaluation and treatment to include chest CT scan, intravenous antibiotics, cultures and routine admission labs.    Gaspar Bidding 06/13/2020, 3:45 PM

## 2020-06-14 LAB — BASIC METABOLIC PANEL
Anion gap: 11 (ref 5–15)
BUN: 9 mg/dL (ref 8–23)
CO2: 24 mmol/L (ref 22–32)
Calcium: 8.3 mg/dL — ABNORMAL LOW (ref 8.9–10.3)
Chloride: 97 mmol/L — ABNORMAL LOW (ref 98–111)
Creatinine, Ser: 0.5 mg/dL — ABNORMAL LOW (ref 0.61–1.24)
GFR, Estimated: >60 mL/min (ref >60)
Glucose, Bld: 109 mg/dL — ABNORMAL HIGH (ref 70–99)
Potassium: 3.5 mmol/L (ref 3.5–5.1)
Sodium: 132 mmol/L — ABNORMAL LOW (ref 135–145)

## 2020-06-14 LAB — CBC
HCT: 29.9 % — ABNORMAL LOW (ref 39.0–52.0)
Hemoglobin: 9.8 g/dL — ABNORMAL LOW (ref 13.0–17.0)
MCH: 28.4 pg (ref 26.0–34.0)
MCHC: 32.8 g/dL (ref 30.0–36.0)
MCV: 86.7 fL (ref 80.0–100.0)
Platelets: 403 10*3/uL — ABNORMAL HIGH (ref 150–400)
RBC: 3.45 MIL/uL — ABNORMAL LOW (ref 4.22–5.81)
RDW: 16.1 % — ABNORMAL HIGH (ref 11.5–15.5)
WBC: 9.3 10*3/uL (ref 4.0–10.5)
nRBC: 0 % (ref 0.0–0.2)

## 2020-06-14 MED ORDER — ZINC OXIDE 40 % EX OINT
TOPICAL_OINTMENT | Freq: Every day | CUTANEOUS | Status: DC
  Filled 2020-06-14 (×2): qty 57

## 2020-06-14 MED ORDER — POTASSIUM CHLORIDE CRYS ER 20 MEQ PO TBCR
40.0000 meq | EXTENDED_RELEASE_TABLET | Freq: Two times a day (BID) | ORAL | Status: DC
  Administered 2020-06-14 (×2): 40 meq via ORAL
  Filled 2020-06-14 (×3): qty 2

## 2020-06-14 MED ORDER — OSMOLITE 1.5 CAL PO LIQD
1680.0000 mL | ORAL | Status: DC
  Administered 2020-06-14 – 2020-06-18 (×5): 1680 mL
  Administered 2020-06-19 – 2020-06-20 (×2): 1000 mL
  Filled 2020-06-14 (×7): qty 2000

## 2020-06-14 MED ORDER — ENSURE ENLIVE PO LIQD
237.0000 mL | Freq: Two times a day (BID) | ORAL | Status: DC
  Administered 2020-06-14 – 2020-06-17 (×3): 237 mL via ORAL

## 2020-06-14 NOTE — Progress Notes (Signed)
[address]       [place] [address]             [phone]         Subjective: Feels better than yesterday  Objective: Vital signs in last 24 hours: Temp:  [97.5 F (36.4 C)-98.7 F (37.1 C)] 97.5 F (36.4 C) (02/09 0300) Pulse Rate:  [62-114] 68 (02/09 0300) Cardiac Rhythm: Normal sinus rhythm (02/09 0330) Resp:  [14-22] 19 (02/09 0300) BP: (78-96)/(48-70) 89/56 (02/09 0300) SpO2:  [92 %-96 %] 95 % (02/09 0300) Weight:  [77 kg-77.1 kg] 77.1 kg (02/09 0649)  Hemodynamic parameters for last 24 hours:    Intake/Output from previous day: 02/08 0701 - 02/09 0700 In: 420 [P.O.:120; IV Piggyback:300] Out: 400 [Urine:400] Intake/Output this shift: No intake/output data recorded.  General appearance: alert, cooperative and no distress Heart: regular rate and rhythm Lungs: dim in bases Abdomen: benign Extremities: no edema  Wound: dressing with mod drainage  Lab Results: Recent Labs    06/13/20 1652 06/14/20 0022  WBC 13.1* 9.3  HGB 11.0* 9.8*  HCT 32.6* 29.9*  PLT 468* 403*   BMET:  Recent Labs    06/13/20 1717 06/14/20 0022  NA 133* 132*  K 4.0 3.5  CL 94* 97*  CO2 26 24  GLUCOSE 128* 109*  BUN 11 9  CREATININE 0.65 0.50*  CALCIUM 8.7* 8.3*    PT/INR:  Recent Labs    06/13/20 1652  LABPROT 16.3*  INR 1.4*   ABG    Component Value Date/Time   PHART 7.454 (H) 02/25/2020 0500   HCO3 26.5 02/25/2020 0500   TCO2 27 02/24/2020 1944   ACIDBASEDEF 1.0 02/24/2020 1547   O2SAT 93.7 02/25/2020 0500   CBG (last 3)  No results for input(s): GLUCAP in the last 72 hours.  Meds Scheduled Meds: . enoxaparin (LOVENOX) injection  40 mg Subcutaneous Q24H  . liver oil-zinc oxide   Topical Daily  . sodium chloride flush  3 mL Intravenous Q12H   Continuous Infusions: . sodium chloride    . piperacillin-tazobactam (ZOSYN)  IV 3.375 g (06/14/20 0325)  . vancomycin 1,250 mg (06/14/20 0630)   PRN Meds:.sodium chloride,  acetaminophen **OR** acetaminophen, magnesium hydroxide, sodium chloride flush  Xrays DG Chest 2 View  Result Date: 06/13/2020 CLINICAL DATA:  Previous chest tube on the left, currently with drainage from this area EXAM: CHEST - 2 VIEW COMPARISON:  May 29, 2020 FINDINGS: Chest tube no longer evident on the left. There is a left pleural effusion with atelectatic change in each lower lung region. No consolidation. No pneumothorax. The heart size is normal. The pulmonary vascularity is normal. Postoperative changes noted with evidence of partial esophagectomy with gastric swingthrough procedure. Mild degenerative change in the thoracic spine noted. IMPRESSION: No pneumothorax. Left pleural effusion noted. Bibasilar atelectasis. Heart size normal. Postoperative changes consistent with partial esophagectomy and gastric swingthrough procedure. Electronically Signed   By: Lowella Grip III M.D.   On: 06/13/2020 14:56   CT CHEST W CONTRAST  Result Date: 06/13/2020 CLINICAL DATA:  Pneumonia, effusion, or abscess. Purulent drainage from the left chest. EXAM: CT CHEST WITH CONTRAST TECHNIQUE: Multidetector CT imaging of the chest was performed during intravenous contrast administration. CONTRAST:  77mL OMNIPAQUE IOHEXOL 300 MG/ML  SOLN COMPARISON:  Chest CT dated 04/10/2020 and radiograph dated 06/13/2020. FINDINGS: Cardiovascular: There is no cardiomegaly or pericardial effusion. There is coronary vascular calcification. Mild atherosclerotic calcification of the thoracic aorta.  No aneurysmal dilatation or dissection. No pulmonary artery embolus identified. Mediastinum/Nodes: Top-normal right hilar and subcarinal lymph nodes measure up to 10 mm in short axis. Postsurgical changes of esophagectomy and gastric pull-through. No mediastinal fluid collection. Lungs/Pleura: There has been interval removal of the previously seen left-sided chest tube. There is a loculated left pleural effusion with probable several  septation. Overall decrease in the size of the pleural effusion compared to prior CT. There is small amount of fluid in the left fissure. There is consolidative changes of the majority of the left lower lobe as well as partial consolidation of the right lung base which may represent atelectasis or infiltrate. There is no pneumothorax. The central airways are patent. Upper Abdomen: An area of hypodensity in the anterior spleen, may represent a cyst or infarct. Multiple bilateral renal cysts as well as partially visualized liver hypodense lesions which are not characterized on this CT. A tube is partially visualized in a loop of small bowel in the upper abdomen, indeterminate. Clinical correlation is recommended. Musculoskeletal: Degenerative changes of the spine. No acute osseous pathology. A track is noted in the left lateral chest wall extending from the pleural surface to the skin. IMPRESSION: 1. Interval removal of the previously seen left-sided chest tube. Decrease in the size of the loculated septated left pleural effusion compared to prior CT. 2. Bilateral lower lobe consolidative changes which may represent atelectasis or infiltrate. 3. Postsurgical changes of esophagectomy and gastric pull-through. 4. Partially visualized tube in a loop of small bowel in the upper abdomen, indeterminate. Clinical correlation is recommended. 5. Aortic Atherosclerosis (ICD10-I70.0). Electronically Signed   By: Anner Crete M.D.   On: 06/13/2020 19:43    Assessment/Plan:    1 afeb, VSS- conts with drainage but he feels is less 2 sats good on RA 3 CT - consolidative findings noted- cont abx, loculated effus a bit better- may need to consider tube placement 4 replace K+ 5 no  Leukocytosis today, WBC was 13K yesterday 6 anemia slightly worse 7 normal renal fxn 8 wound care nurse assisting with J tube site maceration 9 nutritionist to eval for J tube feedings recs, D1 diet     LOS: 1 day    Brett Giovanni  PA-C Pager [phone] 06/14/2020

## 2020-06-14 NOTE — Progress Notes (Addendum)
Initial Nutrition Assessment  DOCUMENTATION CODES:   Severe malnutrition in context of chronic illness  INTERVENTION:   -Ensure Enlive BID, each supplement provides 350 kcal, 20 g protein  Nocturnal tube feeds via J-tube: - Osmolite 1.5 @ 120 ml/hr to run over 14 hours from 2000 to 1000 (total of 1680 ml)  Tube feeding regimen provides 2520 kcal, 105 grams of protein, and 1280 ml of H2O.   -Encourage PO intake   -Provided diet education on Dysphagia 1 diet. Provided handout to pt from [employer] - IDDSI Level 4 Pureed Nyoka Cowden) Nutrition Therapy handout   NUTRITION DIAGNOSIS:   Severe Malnutrition related to chronic illness,cancer and cancer related treatments as evidenced by severe fat depletion,severe muscle depletion,percent weight loss (29% of weight loss in 3.5 months).  GOAL:   Patient will meet greater than or equal to 90% of their needs,Weight gain  MONITOR:   PO intake,Skin,Supplement acceptance,Weight trends,TF tolerance,Labs  REASON FOR ASSESSMENT:   Consult Assessment of nutrition requirement/status,Enteral/tube feeding initiation and management,Wound healing  ASSESSMENT:   36 YOM admitted for postoperative wound infection from previous chest tube site as well as J- tube maceration. 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 Pt is also s/p Ivor Lewis esophagectomy and J-tube placement on 02/24/20.Esophageal stent placed and since removed 01/24. Pt with J-tube for nutrition.   Pt discussed with intern that his diet has been fair while being in the hospital. He said he had eaten approximately 50% of his dinner which consisted of pork, potatoes and carrots (did not eat) and his breakfast which consisted of pancakes and sausage. Pt did tell intern that he was eager to get started on his tube feed regimen as he was aware that this was where  most of his nutrition came from.   Pt mentioned that at home he has currently been eating 1 scrambled egg, oatmeal and grits throughout the day in addition to his tube feed regimen. He mentioned that his tube feed regimen has been 4-5 cartons of Osmolite 1.5 at 120 mL/hr. He mentioned that he typically starts his tube feed at 12 AM and finishes around 7-8 AM.  Pt's home tube feeding regimen of 4-5 cartons of Osmolite 1.5 is providing 1400-1800 kcals and 60-75 g protein.   Pt has been seeing outpatient RD who  recommended 7 cartons of Osmolite 1.5. However pt was unable to reach the goal of 7 cartons per day therefore outpt RD recommended 6 cartons of TwoCal HN which would provide 2850 calories and 119 g protein and 996 mL free water. Pt did mention that he had recently switched to TwoCal HN, however discussed with intern that this was causing bloating as well as gas.   Pt discussed with intern that at home he had been drinking a milky Boost supplement once per day. He mentioned that he was not able to drink more due to financial constraints. Intern mentioned consuming Ensure Enlive while in the hospital to increase protein and calories and pt was eager to receive these.   Pt denied any chewing or swallowing difficulty. Pt denied any nausea or diarrhea. Pt did mention abdominal bloating and gas d/t adjusted tube feeding formulation as mentioned above. Pt denied vomiting, but did note that he had been experiencing some reflux (per pt is "bubbly spit"). Pt had not experienced this symptom today.   Intern and RD discussed with pt his current diet of  Dysphagia 1 and the importance of consuming pureed foods. RD provided pt with IDDSI Level 4 Pureed Nyoka Cowden) Nutrition Therapy handout. Intern discussed this handout with pt.  Pt currently weighs 170#. Pt has experienced a 29% of weight loss in 3.5 months since 10/19. This weight loss is significant.   Meds Reviewed: Klor-Con (40 mEq, BID)  Labs Reviewed:  Sodium (132 mg/dL), Chloride (97 mmol/L), Glucose (109 mg/dL), Creatinine (0.50 mg/dL), Calcium (8.3 mg/dL)  NUTRITION - FOCUSED PHYSICAL EXAM:  Flowsheet Row Most Recent Value  Orbital Region Moderate depletion  Upper Arm Region Severe depletion  Thoracic and Lumbar Region Severe depletion  Buccal Region Moderate depletion  Temple Region Moderate depletion  Clavicle Bone Region Severe depletion  Clavicle and Acromion Bone Region Severe depletion  Scapular Bone Region Unable to assess  Dorsal Hand Moderate depletion  Patellar Region Severe depletion  Anterior Thigh Region Severe depletion  Posterior Calf Region Severe depletion  Edema (RD Assessment) None  Hair Reviewed  Eyes Reviewed  Mouth Reviewed  Skin Reviewed  Nails Reviewed       Diet Order:   Diet Order            DIET - DYS 1 Room service appropriate? Yes; Fluid consistency: Thin  Diet effective now                 EDUCATION NEEDS:   Education needs have been addressed (education on Dysphagia 1 diet)  Skin:  Skin Assessment: Skin Integrity Issues: Skin Integrity Issues:: Incisions Incisions: Closed- left flank  Last BM:  unknown.  Height:   Ht Readings from Last 1 Encounters:  06/13/20 5\' 11"  (1.803 m)    Weight:   Wt Readings from Last 1 Encounters:  06/14/20 77.1 kg    Ideal Body Weight:  78.2 kg  BMI:  Body mass index is 23.71 kg/m.  Estimated Nutritional Needs:   Kcal:  6761-9509   Protein:  125-140 g   Fluid:  >/= 2.2 L  Salvadore Oxford, Dietetic Intern 06/14/2020 2:33 PM

## 2020-06-14 NOTE — Consult Note (Signed)
[place] Nurse Consult Note: Patient receiving care in [place]. Reason for Consult: feeding tube insertion site irritation Wound type: ICD-10 CM Codes for Irritant Dermatitis L24A0 - Due to friction or contact with body fluids; unspecified Pressure Injury POA: Yes/No/NA Measurement: Wound bed: red, irritated Drainage (amount, consistency, odor)  Periwound: intact Dressing procedure/placement/frequency:  Wash feeding tube insertion site with soap and water. Pat dry. Apply Desitin cream to irritated area. Place a Drawtex split trach dressing around tube Kellie Simmering [phone]). Perform daily. Monitor the wound area(s) for worsening of condition such as: Signs/symptoms of infection,  Increase in size,  Development of or worsening of odor, Development of pain, or increased pain at the affected locations.  Notify the medical team if any of these develop.  Thank you for the consult.  Discussed plan of care with the patient and bedside nurse.  [place] nurse will not follow at this time.  Please re-consult the [place] team if needed.  Val Riles, RN, MSN, CWOCN, CNS-BC, pager [phone]

## 2020-06-14 NOTE — Plan of Care (Signed)

## 2020-06-14 NOTE — Progress Notes (Incomplete)
Supplies received for patient G Tube  dressing change.  Old dressing from gastric tube removed and abdominal hair shaved within the tape zone.  GTube site cleansed of odiferous tan fluid with warm soap and water, dried and thick layer of desitin applied.  Draw tech applied on top of  desitin and then five split gauze applied over top.  Taped securely into place Patient instructed to call staff if dressing feels wet or smells bad and if it falls off.  Patient previous chest tube site on the left lateral ribcage open and oozing foul smelling  tan colored discharge on a continuous basis.  Worse with coughing or movements.  Site cleansed with soap and warm water and rinsed with clean water and then dried.

## 2020-06-15 ENCOUNTER — Inpatient Hospital Stay (HOSPITAL_COMMUNITY): Payer: 59

## 2020-06-15 DIAGNOSIS — E43 Unspecified severe protein-calorie malnutrition: Secondary | ICD-10-CM | POA: Insufficient documentation

## 2020-06-15 DIAGNOSIS — J90 Pleural effusion, not elsewhere classified: Secondary | ICD-10-CM

## 2020-06-15 LAB — AEROBIC CULTURE W GRAM STAIN (SUPERFICIAL SPECIMEN)

## 2020-06-15 MED ORDER — HYDROCODONE-ACETAMINOPHEN 7.5-325 MG/15ML PO SOLN
10.0000 mL | Freq: Four times a day (QID) | ORAL | Status: DC | PRN
  Administered 2020-06-15: 10 mL via ORAL
  Filled 2020-06-15: qty 15

## 2020-06-15 MED ORDER — ACETAMINOPHEN 325 MG PO TABS: 650.0000 mg | ORAL_TABLET | Freq: Four times a day (QID) | ORAL | Status: DC | PRN

## 2020-06-15 MED ORDER — FENTANYL CITRATE (PF) 100 MCG/2ML IJ SOLN
INTRAMUSCULAR | Status: AC | PRN
  Administered 2020-06-15 (×2): 25 ug via INTRAVENOUS

## 2020-06-15 MED ORDER — MAGNESIUM HYDROXIDE 400 MG/5ML PO SUSP: 30.0000 mL | Freq: Every day | ORAL | Status: DC | PRN

## 2020-06-15 MED ORDER — LACTULOSE 10 GM/15ML PO SOLN
20.0000 g | Freq: Once | ORAL | Status: AC
  Administered 2020-06-15: 20 g
  Filled 2020-06-15: qty 30

## 2020-06-15 MED ORDER — LIDOCAINE HCL 1 % IJ SOLN
INTRAMUSCULAR | Status: AC
  Filled 2020-06-15: qty 20

## 2020-06-15 MED ORDER — METOCLOPRAMIDE HCL 5 MG/5ML PO SOLN
10.0000 mg | Freq: Three times a day (TID) | ORAL | Status: DC
  Administered 2020-06-15 – 2020-06-23 (×23): 10 mg via JEJUNOSTOMY
  Filled 2020-06-15 (×31): qty 10

## 2020-06-15 MED ORDER — FENTANYL CITRATE (PF) 100 MCG/2ML IJ SOLN
INTRAMUSCULAR | Status: AC
  Filled 2020-06-15: qty 2

## 2020-06-15 MED ORDER — SODIUM CHLORIDE 0.9 % IV SOLN
INTRAVENOUS | Status: AC | PRN
  Administered 2020-06-15: 250 mL via INTRAVENOUS

## 2020-06-15 MED ORDER — ACETAMINOPHEN 650 MG RE SUPP: 650.0000 mg | Freq: Four times a day (QID) | RECTAL | Status: DC | PRN

## 2020-06-15 MED ORDER — POTASSIUM CHLORIDE 20 MEQ PO PACK
40.0000 meq | PACK | Freq: Two times a day (BID) | ORAL | Status: DC
  Administered 2020-06-15 – 2020-06-22 (×12): 40 meq
  Filled 2020-06-15 (×16): qty 2

## 2020-06-15 MED ORDER — MIDAZOLAM HCL 2 MG/2ML IJ SOLN
INTRAMUSCULAR | Status: AC
  Filled 2020-06-15: qty 2

## 2020-06-15 MED ORDER — HYDROCODONE-ACETAMINOPHEN 7.5-325 MG/15ML PO SOLN
10.0000 mL | Freq: Four times a day (QID) | ORAL | Status: DC | PRN
  Administered 2020-06-15: 10 mL
  Filled 2020-06-15 (×2): qty 15

## 2020-06-15 NOTE — Procedures (Signed)
Interventional Radiology Procedure Note  Procedure: Image guided drain placement, left chest tube.  88F pigtail drain.  Complications: None  EBL: None Sample: Culture sent  Recommendations: - Routine chest drain care,  record output - follow up Cx - routine wound care  Signed,  Dulcy Fanny. Earleen Newport, DO

## 2020-06-15 NOTE — Progress Notes (Signed)
Around 12:00 am tonight pt requested to stopped tube feeding. Pt had a total of 500 ml of Osmolite 1.5. As per pt he feels his stomach is boated & full of gas & he will speak to the MD in am about his diet. Stopped tube feeding as per pt's request. Will continue to monitor pt.

## 2020-06-15 NOTE — Progress Notes (Signed)
Pt transferred from 2C17.  VSS, telemetry applied and CCMD called. CHG. Pt oriented to room and familiar with hospital. [facility] board updated.  Payton Emerald, RN

## 2020-06-15 NOTE — Progress Notes (Addendum)
[address]Suite [address]       [place] [address]             [phone]         Subjective: feels fair  Objective: Vital signs in last 24 hours: Temp:  [98 F (36.7 C)-98.4 F (36.9 C)] 98 F (36.7 C) (02/10 0502) Pulse Rate:  [61-79] 61 (02/10 0502) Cardiac Rhythm: Normal sinus rhythm (02/10 0400) Resp:  [12-19] 18 (02/10 0502) BP: (90-108)/(53-92) 90/53 (02/10 0502) SpO2:  [91 %-95 %] 92 % (02/10 0502) Weight:  [78.7 kg] 78.7 kg (02/10 0502)  Hemodynamic parameters for last 24 hours:    Intake/Output from previous day: 02/09 0701 - 02/10 0700 In: 488 [NG/GT:488] Out: 275 [Urine:275] Intake/Output this shift: No intake/output data recorded.  General appearance: alert, cooperative and no distress Heart: regular rate and rhythm Lungs: dim in lower lung fields Abdomen: benign Extremities: no edema Wound: dressing clean- drainage decreasing  Lab Results: Recent Labs    06/13/20 1652 06/14/20 0022  WBC 13.1* 9.3  HGB 11.0* 9.8*  HCT 32.6* 29.9*  PLT 468* 403*   BMET:  Recent Labs    06/13/20 1717 06/14/20 0022  NA 133* 132*  K 4.0 3.5  CL 94* 97*  CO2 26 24  GLUCOSE 128* 109*  BUN 11 9  CREATININE 0.65 0.50*  CALCIUM 8.7* 8.3*    PT/INR:  Recent Labs    06/13/20 1652  LABPROT 16.3*  INR 1.4*   ABG    Component Value Date/Time   PHART 7.454 (H) 02/25/2020 0500   HCO3 26.5 02/25/2020 0500   TCO2 27 02/24/2020 1944   ACIDBASEDEF 1.0 02/24/2020 1547   O2SAT 93.7 02/25/2020 0500   CBG (last 3)  No results for input(s): GLUCAP in the last 72 hours.  Meds Scheduled Meds: . enoxaparin (LOVENOX) injection  40 mg Subcutaneous Q24H  . feeding supplement  237 mL Oral BID BM  . feeding supplement (OSMOLITE 1.5 CAL)  1,680 mL Per Tube Q24H  . liver oil-zinc oxide   Topical Daily  . potassium chloride  40 mEq Oral BID  . sodium chloride flush  3 mL Intravenous Q12H   Continuous Infusions: . sodium chloride    .  piperacillin-tazobactam (ZOSYN)  IV 3.375 g (06/15/20 0300)  . vancomycin 1,250 mg (06/15/20 0651)   PRN Meds:.sodium chloride, acetaminophen **OR** acetaminophen, magnesium hydroxide, sodium chloride flush  Xrays DG Chest 2 View  Result Date: 06/13/2020 CLINICAL DATA:  Previous chest tube on the left, currently with drainage from this area EXAM: CHEST - 2 VIEW COMPARISON:  May 29, 2020 FINDINGS: Chest tube no longer evident on the left. There is a left pleural effusion with atelectatic change in each lower lung region. No consolidation. No pneumothorax. The heart size is normal. The pulmonary vascularity is normal. Postoperative changes noted with evidence of partial esophagectomy with gastric swingthrough procedure. Mild degenerative change in the thoracic spine noted. IMPRESSION: No pneumothorax. Left pleural effusion noted. Bibasilar atelectasis. Heart size normal. Postoperative changes consistent with partial esophagectomy and gastric swingthrough procedure. Electronically Signed   By: Lowella Grip III M.D.   On: 06/13/2020 14:56   CT CHEST W CONTRAST  Result Date: 06/13/2020 CLINICAL DATA:  Pneumonia, effusion, or abscess. Purulent drainage from the left chest. EXAM: CT CHEST WITH CONTRAST TECHNIQUE: Multidetector CT imaging of the chest was performed during intravenous contrast administration. CONTRAST:  38mL OMNIPAQUE IOHEXOL 300 MG/ML  SOLN COMPARISON:  Chest  CT dated 04/10/2020 and radiograph dated 06/13/2020. FINDINGS: Cardiovascular: There is no cardiomegaly or pericardial effusion. There is coronary vascular calcification. Mild atherosclerotic calcification of the thoracic aorta. No aneurysmal dilatation or dissection. No pulmonary artery embolus identified. Mediastinum/Nodes: Top-normal right hilar and subcarinal lymph nodes measure up to 10 mm in short axis. Postsurgical changes of esophagectomy and gastric pull-through. No mediastinal fluid collection. Lungs/Pleura: There has been  interval removal of the previously seen left-sided chest tube. There is a loculated left pleural effusion with probable several septation. Overall decrease in the size of the pleural effusion compared to prior CT. There is small amount of fluid in the left fissure. There is consolidative changes of the majority of the left lower lobe as well as partial consolidation of the right lung base which may represent atelectasis or infiltrate. There is no pneumothorax. The central airways are patent. Upper Abdomen: An area of hypodensity in the anterior spleen, may represent a cyst or infarct. Multiple bilateral renal cysts as well as partially visualized liver hypodense lesions which are not characterized on this CT. A tube is partially visualized in a loop of small bowel in the upper abdomen, indeterminate. Clinical correlation is recommended. Musculoskeletal: Degenerative changes of the spine. No acute osseous pathology. A track is noted in the left lateral chest wall extending from the pleural surface to the skin. IMPRESSION: 1. Interval removal of the previously seen left-sided chest tube. Decrease in the size of the loculated septated left pleural effusion compared to prior CT. 2. Bilateral lower lobe consolidative changes which may represent atelectasis or infiltrate. 3. Postsurgical changes of esophagectomy and gastric pull-through. 4. Partially visualized tube in a loop of small bowel in the upper abdomen, indeterminate. Clinical correlation is recommended. 5. Aortic Atherosclerosis (ICD10-I70.0). Electronically Signed   By: Anner Crete M.D.   On: 06/13/2020 19:43    Assessment/Plan:  1 afeb, VSS, SBP runs low 90- low 100's 2 sats ok on RA 3 no new labs or CXR 4 getting TF's and supplements- had to stop do to some GI discomfort, will try again- abd appears benign 5 getting wound care- may need new chest drain      LOS: 2 days    John Giovanni  PA-C Pager [phone] 06/15/2020 Patient seen and  examined, agree with above D/w Dr Earleen Newport of IR- they will place a drain, hopefully later today Will make NPO for now and hold TF until drain placed Continue antibiotics Will give lactulose via tube for constipation Anemia secondary to ABL during last admission, also component of iron deficiency  Remo Lipps C. Roxan Hockey, MD [facility] [phone]

## 2020-06-15 NOTE — Plan of Care (Signed)

## 2020-06-15 NOTE — H&P (Signed)
Chief Complaint: Loculated effusion  Referring Physician(s): Roxan Hockey  Supervising Physician: Corrie Mckusick  Patient Status: [facility] - Out-pt  History of Present Illness: Brett Dawson is a 65 y.o. male who is s/p minimally invasive Ivor Lewis esophagectomy.   He has had multiple postprocedural issues including anastomotic leak with placement of an esophageal stent.   The stent was subsequently removed on 05/29/2020 by Dr. Kipp Brood.   He developed purulent drainage from a previous chest tube site.  Chest x-ray revealed a left pleural effusion with bilateral airspace disease consistent with atelectasis or possible infiltrates.   CT scan showed = Interval removal of the previously seen left-sided chest tube. Decrease in the size of the loculated septated left pleural effusion compared to prior CT.  We are asked to place a new pigtail chest tube.  He is NPO and tube feeds were held.  Past Medical History:  Diagnosis Date  . Anemia   . Anemia   . Anginal pain ([place])    as a child  . Dyspnea    due to weakness  . Gout   . History of blood transfusion   . Malignant neoplasm of lower third of esophagus ([place]) 11/24/2019    Past Surgical History:  Procedure Laterality Date  . BIOPSY  11/23/2019   Procedure: BIOPSY;  Surgeon: Arta Silence, MD;  Location: [facility];  Service: Endoscopy;;  . ESOPHAGEAL STENT PLACEMENT N/A 04/11/2020   Procedure: ESOPHAGOGASTROSCOPY WITH ESOPHAGEAL STENT PLACEMENT;  Surgeon: Lajuana Matte, MD;  Location: [place];  Service: Thoracic;  Laterality: N/A;  . ESOPHAGEAL STENT PLACEMENT N/A 05/29/2020   Procedure: ESOPHAGEAL STENT REMOVAL;  Surgeon: Lajuana Matte, MD;  Location: [place];  Service: Thoracic;  Laterality: N/A;  . ESOPHAGOGASTRODUODENOSCOPY N/A 02/24/2020   Procedure: ESOPHAGOGASTRODUODENOSCOPY (EGD);  Surgeon: Lajuana Matte, MD;  Location: [facility] OR;  Service: Thoracic;  Laterality: N/A;  .  ESOPHAGOGASTRODUODENOSCOPY N/A 05/29/2020   Procedure: ESOPHAGOGASTRODUODENOSCOPY (EGD);  Surgeon: Lajuana Matte, MD;  Location: [facility] OR;  Service: Thoracic;  Laterality: N/A;  . ESOPHAGOGASTRODUODENOSCOPY (EGD) WITH PROPOFOL N/A 11/23/2019   Procedure: ESOPHAGOGASTRODUODENOSCOPY (EGD) WITH PROPOFOL;  Surgeon: Arta Silence, MD;  Location: [facility];  Service: Endoscopy;  Laterality: N/A;  . INTERCOSTAL NERVE BLOCK Right 02/24/2020   Procedure: INTERCOSTAL NERVE BLOCK;  Surgeon: Lajuana Matte, MD;  Location: [place];  Service: Thoracic;  Laterality: Right;  . IR EMBO ART  VEN HEMORR LYMPH EXTRAV  [place]  04/07/2020  . IR FLUORO GUIDED NEEDLE PLC ASPIRATION/INJECTION LOC  03/23/2020  . IR LYMPHANGIOGRAM PEL/ABD BILAT  03/23/2020  . IR LYMPHANGIOGRAM PEL/ABD BILAT  04/07/2020  . IR US GUIDANCE  03/23/2020  . IR US GUIDANCE  03/23/2020  . IR US GUIDE VASC ACCESS LEFT  04/07/2020  . IR US GUIDE VASC ACCESS RIGHT  04/07/2020  . RADIOLOGY WITH ANESTHESIA N/A 03/23/2020   Procedure: Malignant neoplasm of esophagus;  Surgeon: Sandi Mariscal, MD;  Location: [place];  Service: Radiology;  Laterality: N/A;  . RADIOLOGY WITH ANESTHESIA N/A 04/07/2020   Procedure: IR WITH ANESTHESIA LYMPHANGIOGRAM BILATERAL WITH EMBOLIZATION;  Surgeon: Criselda Peaches, MD;  Location: [place];  Service: Radiology;  Laterality: N/A;  . TONSILLECTOMY    . VIDEO ASSISTED THORACOSCOPY (VATS)/EMPYEMA Left 04/11/2020   Procedure: LEFT VIDEO ASSISTED THORACOSCOPY (VATS) WITH IRRIGATION AND CHEST TUBE PLACEMENT;  Surgeon: Lajuana Matte, MD;  Location: [place];  Service: Thoracic;  Laterality: Left;    Allergies: Bee venom  and Strawberry extract  Medications: Prior to Admission medications   Medication Sig Start Date End Date Taking? Authorizing Provider  Nutritional Supplements (FEEDING SUPPLEMENT, OSMOLITE 1.5 CAL,) LIQD Place 1,760 mLs into feeding tube daily. 04/21/20  Yes Barrett, Erin R, PA-C   acetaminophen (TYLENOL) 160 MG/5ML solution Place 20.3 mLs (650 mg total) into feeding tube every 4 (four) hours as needed for fever (fever >100.5). 04/21/20   Barrett, Erin R, PA-C  famotidine (PEPCID) 40 MG/5ML suspension Take by mouth. 05/30/20   [provider]  ferrous sulfate 325 (65 FE) MG tablet 1 tablet    [provider]  fluticasone (FLONASE) 50 MCG/ACT nasal spray Place 2 sprays into both nostrils daily. 04/21/20   Barrett, Erin R, PA-C  Nutritional Supplements (FEEDING SUPPLEMENT, PROSOURCE TF,) liquid Place 45 mLs into feeding tube 3 (three) times daily. Patient not taking: Reported on 06/13/2020 04/21/20   Barrett, Lodema Hong, PA-C  Water For Irrigation, Sterile (FREE WATER) SOLN Place 75 mLs into feeding tube every 4 (four) hours. 04/21/20   Barrett, Lodema Hong, PA-C     Family History  Problem Relation Age of Onset  . Hyperlipidemia Mother     Social History   Socioeconomic History  . Marital status: Legally Separated    Spouse name: Not on file  . Number of children: 1  . Years of education: Not on file  . Highest education level: High school graduate  Occupational History  . Occupation: maintance/security at [employer]  Tobacco Use  . Smoking status: Never Smoker  . Smokeless tobacco: Never Used  Vaping Use  . Vaping Use: Former  Substance and Sexual Activity  . Alcohol use: Not Currently  . Drug use: Yes    Types: Marijuana    Comment: last time 02/2020  . Sexual activity: Yes  Other Topics Concern  . Not on file  Social History Narrative  . Not on file   Social Determinants of Health   Financial Resource Strain: Low Risk   . Difficulty of Paying Living Expenses: Not hard at all  Food Insecurity: No Food Insecurity  . Worried About Charity fundraiser in the Last Year: Never true  . Ran Out of Food in the Last Year: Never true  Transportation Needs: No Transportation Needs  . Lack of Transportation (Medical): No  . Lack of  Transportation (Non-Medical): No  Physical Activity: Not on file  Stress: No Stress Concern Present  . Feeling of Stress : Not at all  Social Connections: Moderately Isolated  . Frequency of Communication with Friends and Family: More than three times a week  . Frequency of Social Gatherings with Friends and Family: More than three times a week  . Attends Religious Services: 1 to 4 times per year  . Active Member of Clubs or Organizations: No  . Attends Archivist Meetings: Never  . Marital Status: Separated     Review of Systems: A 12 point ROS discussed and pertinent positives are indicated in the HPI above.  All other systems are negative.  Review of Systems  Vital Signs: BP 90/63 (BP Location: Right Arm)   Pulse 63   Temp 97.8 F (36.6 C) (Oral)   Resp 16   Ht 5\' 11"  (1.803 m)   Wt 78.7 kg Comment: pt refused to stand  SpO2 90%   BMI 24.20 kg/m   Physical Exam Vitals reviewed.  Constitutional:      Appearance: Normal appearance.  HENT:  Head: Normocephalic and atraumatic.  Eyes:     Extraocular Movements: Extraocular movements intact.  Cardiovascular:     Rate and Rhythm: Normal rate and regular rhythm.  Pulmonary:     Effort: Pulmonary effort is normal. No respiratory distress.     Comments: Diminished breath sounds on the left. Abdominal:     General: There is no distension.     Palpations: Abdomen is soft.     Tenderness: There is no abdominal tenderness.  Musculoskeletal:        General: Normal range of motion.     Cervical back: Normal range of motion.  Skin:    General: Skin is warm and dry.  Neurological:     General: No focal deficit present.     Mental Status: He is alert and oriented to person, place, and time.  Psychiatric:        Mood and Affect: Mood normal.        Behavior: Behavior normal.        Thought Content: Thought content normal.        Judgment: Judgment normal.     Imaging: DG Chest 1 View  Result Date:  05/29/2020 CLINICAL DATA:  Removal of esophageal stent.  Esophageal carcinoma EXAM: CHEST  1 VIEW COMPARISON:  Chest radiograph May 29, 2020 FLUOROSCOPY TIME:  0 minutes 53 seconds; 10.97 mGy FINDINGS: A series of images obtained show interval endoscopic removal of esophageal stent. Contrast administration shows areas of mucosal irregularity in the esophagus. There is contrast which appears to remain in the wall of the esophagus on the right without frank extravasation. Surrounding lung parenchyma appears unremarkable. Heart size normal. IMPRESSION: Interval removal of esophageal stent. On frontal view, there is suggestion of a linear focus of contrast which may reside within the wall of the esophagus on the right. There is no gross extravasation of contrast in this area. Mucosal irregularity noted in the esophagus. It may be reasonable to consider noncontrast chest CT to further evaluate the rightward aspect of the esophageal wall given what appears to be contrast remaining in this area. These results will be called to the ordering clinician or representative by the Radiologist Assistant, and communication documented in the PACS or [employer]. Electronically Signed   By: Lowella Grip III M.D.   On: 05/29/2020 12:15   DG Chest 2 View  Result Date: 06/13/2020 CLINICAL DATA:  Previous chest tube on the left, currently with drainage from this area EXAM: CHEST - 2 VIEW COMPARISON:  May 29, 2020 FINDINGS: Chest tube no longer evident on the left. There is a left pleural effusion with atelectatic change in each lower lung region. No consolidation. No pneumothorax. The heart size is normal. The pulmonary vascularity is normal. Postoperative changes noted with evidence of partial esophagectomy with gastric swingthrough procedure. Mild degenerative change in the thoracic spine noted. IMPRESSION: No pneumothorax. Left pleural effusion noted. Bibasilar atelectasis. Heart size normal. Postoperative changes  consistent with partial esophagectomy and gastric swingthrough procedure. Electronically Signed   By: Lowella Grip III M.D.   On: 06/13/2020 14:56   DG Chest 2 View  Result Date: 05/29/2020 CLINICAL DATA:  Preop evaluation for upcoming stent removal EXAM: CHEST - 2 VIEW COMPARISON:  04/19/2020 FINDINGS: Cardiac shadow is within normal limits. Left-sided chest tube is again noted. Small bilateral pleural effusions are again seen and stable. No pneumothorax is noted. Esophageal stent and changes of prior embolotherapy are seen. Mild bibasilar atelectasis is again noted. No new focal  abnormality is seen. IMPRESSION: Stable appearance of the chest when compared with the prior exam. No new focal abnormality is seen. Electronically Signed   By: Inez Catalina M.D.   On: 05/29/2020 08:31   CT CHEST W CONTRAST  Result Date: 06/13/2020 CLINICAL DATA:  Pneumonia, effusion, or abscess. Purulent drainage from the left chest. EXAM: CT CHEST WITH CONTRAST TECHNIQUE: Multidetector CT imaging of the chest was performed during intravenous contrast administration. CONTRAST:  90mL OMNIPAQUE IOHEXOL 300 MG/ML  SOLN COMPARISON:  Chest CT dated 04/10/2020 and radiograph dated 06/13/2020. FINDINGS: Cardiovascular: There is no cardiomegaly or pericardial effusion. There is coronary vascular calcification. Mild atherosclerotic calcification of the thoracic aorta. No aneurysmal dilatation or dissection. No pulmonary artery embolus identified. Mediastinum/Nodes: Top-normal right hilar and subcarinal lymph nodes measure up to 10 mm in short axis. Postsurgical changes of esophagectomy and gastric pull-through. No mediastinal fluid collection. Lungs/Pleura: There has been interval removal of the previously seen left-sided chest tube. There is a loculated left pleural effusion with probable several septation. Overall decrease in the size of the pleural effusion compared to prior CT. There is small amount of fluid in the left fissure.  There is consolidative changes of the majority of the left lower lobe as well as partial consolidation of the right lung base which may represent atelectasis or infiltrate. There is no pneumothorax. The central airways are patent. Upper Abdomen: An area of hypodensity in the anterior spleen, may represent a cyst or infarct. Multiple bilateral renal cysts as well as partially visualized liver hypodense lesions which are not characterized on this CT. A tube is partially visualized in a loop of small bowel in the upper abdomen, indeterminate. Clinical correlation is recommended. Musculoskeletal: Degenerative changes of the spine. No acute osseous pathology. A track is noted in the left lateral chest wall extending from the pleural surface to the skin. IMPRESSION: 1. Interval removal of the previously seen left-sided chest tube. Decrease in the size of the loculated septated left pleural effusion compared to prior CT. 2. Bilateral lower lobe consolidative changes which may represent atelectasis or infiltrate. 3. Postsurgical changes of esophagectomy and gastric pull-through. 4. Partially visualized tube in a loop of small bowel in the upper abdomen, indeterminate. Clinical correlation is recommended. 5. Aortic Atherosclerosis (ICD10-I70.0). Electronically Signed   By: Anner Crete M.D.   On: 06/13/2020 19:43   DG C-Arm 1-60 Min  Result Date: 05/29/2020 : Chest radiograph and C-arm report are combined into a single dictation. Electronically Signed   By: Lowella Grip III M.D.   On: 05/29/2020 12:15   DG ESOPHAGUS W SINGLE CM (SOL OR THIN BA)  Result Date: 05/30/2020 CLINICAL DATA:  History of esophageal/esophagogastric leak following esophagectomy. EXAM: ESOPHOGRAM/BARIUM SWALLOW TECHNIQUE: Single contrast examination was performed using water-soluble and thin barium contrast material. FLUOROSCOPY TIME:  Fluoroscopy Time:  3 minutes 56 seconds Radiation Exposure Index (if provided by the fluoroscopic  device): 57.6 mGy Number of Acquired Spot Images: 6 COMPARISON:  Intraoperative esophagram acquired the same date. FINDINGS: Scout images obtained show suture line along the RIGHT margin of the intrathoracic stomach. Post embolization changes below the thoracic inlet and dense material along the LEFT margin of the stomach representing the embolized thoracic duct. LEFT-sided thoracostomy tube in place. Leads project over the anterior abdomen and chest. Water-soluble contrast was first administered and multiple swallows showing mild irregularity at the anastomotic site in the upper chest just below the clavicular heads. No sign of extravasation of contrast material. Imaging performed  in the RPO and LPO as well as AP positions with head of bed at approximately 30 degrees to challenge the area and then repeated in supine position after filling of the intrathoracic stomach. Thin barium was administered with similar appearance. The stomach emptied into the small bowel without significant difficulty. IMPRESSION: 1. Post esophagectomy with gastroesophageal anastomosis showing caliber transition and mild irregularity but no sign of leak. 2. Emptying of the intrathoracic stomach into the proximal small bowel with some stasis in the stomach on the current study. Electronically Signed   By: Zetta Bills M.D.   On: 05/30/2020 11:48    Labs:  CBC: Recent Labs    05/11/20 0941 05/29/20 0912 06/13/20 1652 06/14/20 0022  WBC 6.7 5.2 13.1* 9.3  HGB 12.0* 10.6* 11.0* 9.8*  HCT 36.6* 33.5* 32.6* 29.9*  PLT 308 308 468* 403*    COAGS: Recent Labs    02/22/20 0830 03/08/20 0015 03/23/20 0446 04/07/20 0046 06/13/20 1652  INR 1.0 1.1 1.2 1.3* 1.4*  APTT 30  --   --   --  32    BMP: Recent Labs    12/27/19 0810 01/03/20 0813 01/11/20 0814 01/26/20 1426 02/22/20 0830 05/11/20 0941 05/29/20 0912 06/13/20 1717 06/14/20 0022  NA 136 140 139 140   < > 137 137 133* 132*  K 4.0 3.9 4.2 4.2   < > 4.3 4.4  4.0 3.5  CL 107 106 105 106   < > 100 100 94* 97*  CO2 23 27 29 29    < > 27 23 26 24   GLUCOSE 117* 127* 105* 99   < > 125* 104* 128* 109*  BUN 14 12 11 10    < > 10 9 11 9   CALCIUM 9.4 9.4 9.0 8.8*   < > 9.9 9.3 8.7* 8.3*  CREATININE 0.66 0.63 0.68 0.71   < > 0.71 0.55* 0.65 0.50*  GFRNONAA >60 >60 >60 >60   < > >60 >60 >60 >60  GFRAA >60 >60 >60 >60  --   --   --   --   --    < > = values in this interval not displayed.    LIVER FUNCTION TESTS: Recent Labs    04/10/20 0541 05/11/20 0941 05/29/20 0912 06/13/20 1717  BILITOT 0.3 0.8 1.0 1.0  AST 12* 18 32 49*  ALT 12 15 13  75*  ALKPHOS 66 102 84 133*  PROT 4.5* 7.4 6.5 7.1  ALBUMIN 1.4* 3.1* 3.0* 2.3*    TUMOR MARKERS: No results for input(s): AFPTM, CEA, CA199, CHROMGRNA in the last 8760 hours.  Assessment and Plan:  Loculated septated left pleural effusion.  Will proceed with image guided left chest tube placement today by Dr. Earleen Newport.  Risks and benefits discussed with the patient including bleeding, infection, damage to adjacent structures, and sepsis.  All of the patient's questions were answered, patient is agreeable to proceed. Consent signed and in chart.  Thank you for this interesting consult.  I greatly enjoyed meeting Brett Dawson and look forward to participating in their care.  A copy of this report was sent to the requesting provider on this date.  Electronically Signed: Murrell Redden, PA-C   06/15/2020, 8:32 AM      I spent a total of 40 Minutes in face to face in clinical consultation, greater than 50% of which was counseling/coordinating care for pigtail chest tube placement.

## 2020-06-16 LAB — GLUCOSE, CAPILLARY
Glucose-Capillary: 142 mg/dL — ABNORMAL HIGH (ref 70–99)
Glucose-Capillary: 85 mg/dL (ref 70–99)
Glucose-Capillary: 153 mg/dL — ABNORMAL HIGH (ref 70–99)

## 2020-06-16 LAB — HEMOGLOBIN A1C
Hgb A1c MFr Bld: 5.4 % (ref 4.8–5.6)
Mean Plasma Glucose: 108.28 mg/dL

## 2020-06-16 LAB — BASIC METABOLIC PANEL
Anion gap: 10 (ref 5–15)
BUN: 8 mg/dL (ref 8–23)
CO2: 21 mmol/L — ABNORMAL LOW (ref 22–32)
Calcium: 8.3 mg/dL — ABNORMAL LOW (ref 8.9–10.3)
Chloride: 101 mmol/L (ref 98–111)
Creatinine, Ser: 0.58 mg/dL — ABNORMAL LOW (ref 0.61–1.24)
GFR, Estimated: >60 mL/min (ref >60)
Glucose, Bld: 209 mg/dL — ABNORMAL HIGH (ref 70–99)
Potassium: 3.9 mmol/L (ref 3.5–5.1)
Sodium: 132 mmol/L — ABNORMAL LOW (ref 135–145)

## 2020-06-16 MED ORDER — INSULIN ASPART 100 UNIT/ML ~~LOC~~ SOLN
0.0000 [IU] | Freq: Three times a day (TID) | SUBCUTANEOUS | Status: DC
  Administered 2020-06-17 – 2020-06-19 (×4): 2 [IU] via SUBCUTANEOUS
  Administered 2020-06-19: 3 [IU] via SUBCUTANEOUS
  Administered 2020-06-20 – 2020-06-22 (×3): 2 [IU] via SUBCUTANEOUS
  Administered 2020-06-23 (×2): 3 [IU] via SUBCUTANEOUS

## 2020-06-16 MED ORDER — SODIUM CHLORIDE 0.9 % IV SOLN
2.0000 g | INTRAVENOUS | Status: DC
  Administered 2020-06-16 – 2020-06-20 (×5): 2 g via INTRAVENOUS
  Filled 2020-06-16 (×2): qty 2
  Filled 2020-06-16 (×3): qty 20
  Filled 2020-06-16: qty 2

## 2020-06-16 NOTE — Progress Notes (Addendum)
Nutrition Follow-up  DOCUMENTATION CODES:   Severe malnutrition in context of chronic illness  INTERVENTION:   -continue Ensure Enlive BID, each supplement provides 350 kcal, 20 grams of protein   -Continue nocturnal tube feeds via J-tube:  Osmolite 1.5 @ 120 ml/hr to run over 14 hours from 2000 to 1000(total of 1680 ml)  Tube feeding regimen provides2520kcal, 105grams of protein, and 1262m of H2O.   -Encourage PO intake    NUTRITION DIAGNOSIS:   Severe Malnutrition related to chronic illness,cancer and cancer related treatments as evidenced by severe fat depletion,severe muscle depletion,percent weight loss (29% of weight loss in 3.5 months).  Ongoing.   GOAL:   Patient will meet greater than or equal to 90% of their needs,Weight gain  Met with tube feed.   MONITOR:   PO intake,Skin,Supplement acceptance,Weight trends,TF tolerance,Labs  REASON FOR ASSESSMENT:   Consult Assessment of nutrition requirement/status,Enteral/tube feeding initiation and management,Wound healing  ASSESSMENT:   64YOM admitted for postoperative wound infection from previous chest tube site as well as J- tube maceration. 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 Pt is also s/p Ivor Lewis esophagectomy and J-tube placement on 02/24/20.Esophageal stent placed and since removed 01/24. Pt with J-tube for nutrition.  Pt discussed with intern that he is still enjoying Ensure Enlive and will continue to drink these daily. Pt was currently drinking an Ensure Enlive when RD and Intern visited and had consumed a few sips. Pt mentioned that he had not yet had breakfast. However, mentioned that he did not have much of an appetite. 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 RD added for pt to have assistant with meal ordering for future  meals to prevent delay.   Pt discussed with intern that he is tolerating his tube feeding well.   Pt mentioned that he is still experiencing gas pain but mentioned that he had no other abdominal pain. Pt denied any diarrhea, constipation or vomiting.   Pt's weights reviewed. Pt's weight has increased since admission.   Admission - 169.75 #   Current - 172.5 #  Meds Reviewed: Reglan (10 mg TID), Klor-Con (40 mEq, BID)   Labs Reviewed: Sodium (132 mg/dL), Glucose (209 mg/dL), Creatinine (0.58 mg/dL), Calcium (8.3 mg/dL)   Diet Order:   Diet Order            DIET - DYS 1 Room service appropriate? Yes with Assist; Fluid consistency: Thin  Diet effective now                 EDUCATION NEEDS:   Education needs have been addressed (education on Dysphagia 1 diet)  Skin:  Skin Assessment: Skin Integrity Issues: Skin Integrity Issues:: Incisions Incisions: Closed - left flank  Last BM:  06/15/20  Height:   Ht Readings from Last 1 Encounters:  06/13/20 '5\' 11"'  (1.803 m)    Weight:   Wt Readings from Last 1 Encounters:  06/16/20 78.2 kg    Ideal Body Weight:  78.2 kg  BMI:  Body mass index is 24.06 kg/m.  Estimated Nutritional Needs:   Kcal:  2600-2800  Protein:  125-140 g  Fluid:  >/= 2.2 L   GSalvadore Oxford Dietetic Intern 06/16/2020 2:16 PM

## 2020-06-16 NOTE — Progress Notes (Signed)
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 Patient awake and alert laying in bed. States left chest tube insertion site is "sore", as expected. Sating 92-95% on RA. Left chest tube site c/d/i.   Allergies: Bee venom and Strawberry extract  Medications: Prior to Admission medications   Medication Sig Start Date End Date Taking? Authorizing Provider  Nutritional Supplements (FEEDING SUPPLEMENT, OSMOLITE 1.5 CAL,) LIQD Place 1,760 mLs into feeding tube daily. 04/21/20  Yes Barrett, Erin R, PA-C  acetaminophen (TYLENOL) 160 MG/5ML solution Place 20.3 mLs (650 mg total) into feeding tube every 4 (four) hours as needed for fever (fever >100.5). 04/21/20   Barrett, Erin R, PA-C  famotidine (PEPCID) 40 MG/5ML suspension Take by mouth. 05/30/20   [provider]  ferrous sulfate 325 (65 FE) MG tablet 1 tablet    [provider]  fluticasone (FLONASE) 50 MCG/ACT nasal spray Place 2 sprays into both nostrils daily. 04/21/20   Barrett, Erin R, PA-C  Nutritional Supplements (FEEDING SUPPLEMENT, PROSOURCE TF,) liquid Place 45 mLs into feeding tube 3 (three) times daily. Patient not taking: Reported on 06/13/2020 04/21/20   Barrett, Lodema Hong, PA-C  Water For Irrigation, Sterile (FREE WATER) SOLN Place 75 mLs into feeding tube every 4 (four) hours. 04/21/20   Barrett, Erin R, PA-C     Vital Signs: BP (!) 91/56  (BP Location: Left Arm)   Pulse 73   Temp 97.9 F (36.6 C) (Oral)   Resp 18   Ht 5\' 11"  (1.803 m)   Wt 172 lb 8 oz (78.2 kg)   SpO2 95%   BMI 24.06 kg/m   Physical Exam Vitals and nursing note reviewed.  Constitutional:      General: He is not in acute distress.    Appearance: Normal appearance.  Pulmonary:     Effort: Pulmonary effort is normal. No respiratory distress.     Comments: On RA. Minimal ooze of purulent/bloody output from prior left chest tube insertion site (inferior to current left chest tube insertion site). Left chest tube site with mild tenderness, no erythema, drainage, or active bleeding; approximately 220 cc serosanguinous fluid in pleure-vac; tube to suction with (-) air leak. Skin:    General: Skin is warm and dry.  Neurological:     Mental Status: He is alert and oriented to person, place, and time.     Imaging: DG Chest 2 View  Result Date: 06/13/2020 CLINICAL DATA:  Previous chest tube on the left, currently with drainage from this area EXAM: CHEST - 2 VIEW COMPARISON:  May 29, 2020 FINDINGS: Chest tube no longer evident on the left. There is a left pleural effusion with atelectatic change in each lower lung region. No consolidation. No pneumothorax. The heart size is normal. The pulmonary vascularity is normal. Postoperative changes noted with evidence of partial esophagectomy with gastric swingthrough procedure. Mild degenerative change in the thoracic spine noted. IMPRESSION: No pneumothorax. Left pleural effusion noted. Bibasilar atelectasis. Heart size normal. Postoperative changes consistent with partial esophagectomy and  gastric swingthrough procedure. Electronically Signed   By: Lowella Grip III M.D.   On: 06/13/2020 14:56   CT CHEST W CONTRAST  Result Date: 06/13/2020 CLINICAL DATA:  Pneumonia, effusion, or abscess. Purulent drainage from the left chest. EXAM: CT CHEST WITH CONTRAST TECHNIQUE: Multidetector CT imaging of the chest was  performed during intravenous contrast administration. CONTRAST:  61mL OMNIPAQUE IOHEXOL 300 MG/ML  SOLN COMPARISON:  Chest CT dated 04/10/2020 and radiograph dated 06/13/2020. FINDINGS: Cardiovascular: There is no cardiomegaly or pericardial effusion. There is coronary vascular calcification. Mild atherosclerotic calcification of the thoracic aorta. No aneurysmal dilatation or dissection. No pulmonary artery embolus identified. Mediastinum/Nodes: Top-normal right hilar and subcarinal lymph nodes measure up to 10 mm in short axis. Postsurgical changes of esophagectomy and gastric pull-through. No mediastinal fluid collection. Lungs/Pleura: There has been interval removal of the previously seen left-sided chest tube. There is a loculated left pleural effusion with probable several septation. Overall decrease in the size of the pleural effusion compared to prior CT. There is small amount of fluid in the left fissure. There is consolidative changes of the majority of the left lower lobe as well as partial consolidation of the right lung base which may represent atelectasis or infiltrate. There is no pneumothorax. The central airways are patent. Upper Abdomen: An area of hypodensity in the anterior spleen, may represent a cyst or infarct. Multiple bilateral renal cysts as well as partially visualized liver hypodense lesions which are not characterized on this CT. A tube is partially visualized in a loop of small bowel in the upper abdomen, indeterminate. Clinical correlation is recommended. Musculoskeletal: Degenerative changes of the spine. No acute osseous pathology. A track is noted in the left lateral chest wall extending from the pleural surface to the skin. IMPRESSION: 1. Interval removal of the previously seen left-sided chest tube. Decrease in the size of the loculated septated left pleural effusion compared to prior CT. 2. Bilateral lower lobe consolidative changes which may represent atelectasis or infiltrate.  3. Postsurgical changes of esophagectomy and gastric pull-through. 4. Partially visualized tube in a loop of small bowel in the upper abdomen, indeterminate. Clinical correlation is recommended. 5. Aortic Atherosclerosis (ICD10-I70.0). Electronically Signed   By: Anner Crete M.D.   On: 06/13/2020 19:43   CT [facility] PLEURAL DRAIN W/INDWELL CATH W/IMG GUIDE  Result Date: 06/15/2020 INDICATION: 65 year old male status post esophagectomy and left empyema EXAM: CT GUIDED DRAINAGE OF  ABSCESS MEDICATIONS: The patient is currently admitted to the hospital and receiving intravenous antibiotics. The antibiotics were administered within an appropriate time frame prior to the initiation of the procedure. ANESTHESIA/SEDATION: 0 mg IV Versed 50 mcg IV Fentanyl Moderate Sedation Time:  No sedation The patient was continuously monitored during the procedure by the interventional radiology nurse under my direct supervision. COMPLICATIONS: None TECHNIQUE: The procedure, risks, benefits, and alternatives were explained to the patient/patient's family, who provided informed consent on the patient's behalf. Specific risks that were addressed included bleeding, infection, ongoing pneumothorax, need for further procedure/surgery, chance of hemorrhage, hemoptysis, cardiopulmonary collapse, death. Questions regarding the procedure were encouraged and answered. The patient understands and consents to the procedure. PROCEDURE: Patient was positioned in the left anterior oblique position on the CT table and scout image of the chest was performed for planning purposes. A low posterior intercostal approach was planned. The skin and subcutaneous tissues were generously infiltrated 1% lidocaine for local anesthesia. A Yueh needle was then used to enter the pleural space with aspiration of purulent fluid.  The plastic Yueh catheter was advanced into the pleural space and an 035 guidewire was advanced to the pleural space. CT confirmed  location. Dilation of the skin tract was performed over the wire, and then modified Seldinger technique was used to place a 12 French pigtail catheter into the empyema. Approximately 110 cc of purulent material aspirated. Sample was sent for culture. Final image was stored. Retention suture was placed.  Sterile dressing was placed. Patient tolerated the procedure well and remained hemodynamically stable throughout. No complications were encountered and no significant blood loss was encounter FINDINGS: CT image confirms residual fluid loculated within the base of the left pleural space. Associated atelectasis/consolidation at the left lung base. Approximately 110 cc of purulent material aspirated with a culture sent. IMPRESSION: Status post CT-guided left empyema drain placement. Signed, Dulcy Fanny. Dellia Nims, RPVI [facility] Electronically Signed   By: Corrie Mckusick D.O.   On: 06/15/2020 12:59    Labs:  CBC: Recent Labs    05/11/20 0941 05/29/20 0912 06/13/20 1652 06/14/20 0022  WBC 6.7 5.2 13.1* 9.3  HGB 12.0* 10.6* 11.0* 9.8*  HCT 36.6* 33.5* 32.6* 29.9*  PLT 308 308 468* 403*    COAGS: Recent Labs    02/22/20 0830 03/08/20 0015 03/23/20 0446 04/07/20 0046 06/13/20 1652  INR 1.0 1.1 1.2 1.3* 1.4*  APTT 30  --   --   --  32    BMP: Recent Labs    12/27/19 0810 01/03/20 0813 01/11/20 0814 01/26/20 1426 02/22/20 0830 05/29/20 0912 06/13/20 1717 06/14/20 0022 06/16/20 0012  NA 136 140 139 140   < > 137 133* 132* 132*  K 4.0 3.9 4.2 4.2   < > 4.4 4.0 3.5 3.9  CL 107 106 105 106   < > 100 94* 97* 101  CO2 23 27 29 29    < > 23 26 24  21*  GLUCOSE 117* 127* 105* 99   < > 104* 128* 109* 209*  BUN 14 12 11 10    < > 9 11 9 8   CALCIUM 9.4 9.4 9.0 8.8*   < > 9.3 8.7* 8.3* 8.3*  CREATININE 0.66 0.63 0.68 0.71   < > 0.55* 0.65 0.50* 0.58*  GFRNONAA >60 >60 >60 >60   < > >60 >60 >60 >60  GFRAA >60 >60 >60 >60  --   --   --    --   --    < > = values in this interval not displayed.    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 Left chest tube stable with approximately 220 cc serosanguinous fluid in pleure-vac, tube to suction with (-) air leak. Continue current chest tube management- tube to suction, continue with serial CXR, further management of chest tube per TCTS. Further plans per TCTS- appreciate and agree with  management. IR will continue to follow peripherally, please call IR with questions/concerns.   Electronically Signed: Earley Abide, PA-C 06/16/2020, 12:05 PM   I spent a total of 15 Minutes at the the patient's bedside AND on the patient's hospital floor or unit, greater than 50% of which was counseling/coordinating care for left empyema s/p left chest tube placement.

## 2020-06-16 NOTE — Progress Notes (Addendum)
      [address]       [place] [address]             [phone]           Subjective: Patient without specific complaint this am.  Objective: Vital signs in last 24 hours: Temp:  [97.6 F (36.4 C)-98.2 F (36.8 C)] 97.6 F (36.4 C) (02/11 0349) Pulse Rate:  [59-90] 71 (02/11 0349) Cardiac Rhythm: Normal sinus rhythm (02/10 2221) Resp:  [11-21] 19 (02/11 0349) BP: (86-101)/(53-88) 101/58 (02/11 0349) SpO2:  [90 %-100 %] 94 % (02/11 0349) Weight:  [78.2 kg] 78.2 kg (02/11 0500)      Intake/Output from previous day: 02/10 0701 - 02/11 0700 In: -  Out: 430 [Urine:250; Drains:180]   Physical Exam:  Cardiovascular: RRR Pulmonary: Clear to auscultation on the right and diminished left base Abdomen: Soft, non tender, bowel sounds present. Extremities: Mild bilateral lower extremity edema. Wounds: Clean and dry.  No erythema or signs of infection. Chest Tube: to suction, no air leak, 230 cc drainage in Pleura VAC this am  Lab Results: [facility]:QIONGE Labs    06/13/20 1652 06/14/20 0022  WBC 13.1* 9.3  HGB 11.0* 9.8*  HCT 32.6* 29.9*  PLT 468* 403*   BMET:  Recent Labs    06/14/20 0022 06/16/20 0012  NA 132* 132*  K 3.5 3.9  CL 97* 101  CO2 24 21*  GLUCOSE 109* 209*  BUN 9 8  CREATININE 0.50* 0.58*  CALCIUM 8.3* 8.3*    PT/INR:  Recent Labs    06/13/20 1652  LABPROT 16.3*  INR 1.4*   ABG:  INR: Will add last result for INR, ABG once components are confirmed Will add last 4 CBG results once components are confirmed  Assessment/Plan:  1. CV - SR 2.  Pulmonary - On room air. S/p 12 French left pigtail chest tube placement. 230 cc in Pleura VAC since placement. Check CXR in am. 3. GI-TFs, dysphagia I diet. Will start accu checks as on TFs 4. ID-continue Zosyn and Vancomycin until final culture result. Initial culture from left chest tube drainage shows Proteus Mirabilis.  Donielle M ZimmermanPA-C 06/16/2020,7:29  AM [phone]  Able to have BM. Flatus, tolerated TF overnight Drain placed yesterday with good positioning Will place drain to water seal Ambulate SCD + enoxaparin Wound culture growing Proteus- will change antibiotics to IV ceftriaxone  Remo Lipps C. Roxan Hockey, MD [facility] [phone]

## 2020-06-16 NOTE — Discharge Summary (Addendum)
Physician Discharge Summary  Patient ID: Brett Dawson MRN: [id number] DOB/AGE: 01/16/1956 65 y.o.  Admit date: 06/13/2020 Discharge date: 06/23/2020  Admission Diagnoses:  Discharge Diagnoses:  Active Problems:   Postoperative wound infection   Wound infection   Protein-calorie malnutrition, severe  Patient Active Problem List   Diagnosis Date Noted  . Protein-calorie malnutrition, severe 06/15/2020  . Postoperative wound infection 06/13/2020  . Wound infection 06/13/2020  . Encounter for attention to other artificial openings of digestive tract ([place]) 06/01/2020  . History of excision of intestinal structure 06/01/2020  . 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 Esophagectomy, anastomotic leak 05/29/2020  . Constipation 05/16/2020  . Gastroesophageal reflux disease 05/16/2020  . Iron deficiency anemia 05/16/2020  . Obesity 05/16/2020  . Periodontitis 05/16/2020  . 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  . Malnutrition ([place]) 02/25/2020  . Esophageal cancer ([place]) 02/24/2020  . Gout 12/14/2019  . Malignant neoplasm of lower third of esophagus ([place]) 11/24/2019  . Esophageal mass 11/12/2019  . Syncope   . 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  He has had multiple postprocedural issues including anastomotic leak with placement of an esophageal stent.  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  The stent was subsequently removed on 05/29/2020 by Dr. Kipp Brood.  He has been on dysphagia 1 diet with jejunostomy tube feedings.  Yesterday he developed drainage from a previous chest tube site that was both purulent and bloody.  He has been having sweats but no chills and no definite fevers.  Chest x-ray reveals a left pleural effusion with bilateral airspace disease consistent with  atelectasis or possible infiltrates.  This is not dramatically different from most recent chest x-ray.  Additionally he does have a significant amount of maceration around the J-tube site.  Due to these findings and clinical deterioration it is felt that he should be admitted for further evaluation to include CT scan of the chest.  We will also place him on intravenous antibiotics.   Hospital course: The patient was admitted and started on intravenous antibiotics and chest CT did show findings consistent with fluid collection.  Interventional radiology was consulted to assist with placement of a drain which was done 06/15/2020.  Cultures have grown Proteus and Bacteroides and he is treated with IV vancomycin and Zosyn.  Nutrition has also been consulted to assist with J-tube feedings and protein malnutrition.  Wound care nurse has also been consulted to assist with dressing changes for macerated changes at the J-tube site.  Over time the patient has shown excellent progress.  His antibiotics were transitioned to intravenous Rocephin and eventually oral Augmentin which he will continue for at least 1 month post discharge.  He has undergone a calorie count to reevaluate his dietary needs in hopes of discontinuing J-tube feeds. Unfortunately his calorie count showed he wasn't meeting needs so will need to continue  at night .  Nutrition is also made recommendations for oral oral supplements.  His chest tube has been connected to a bulb drain.  He has remained afebrile with no significant leukocytosis.  He has a chronic anemia which is stable.  Renal function has also remained within normal limits. At time of d/c he is felt to significantly improved.   Discharged Condition: good  Consults: None  Significant Diagnostic Studies: CHEST  TUBE CT SCAN  Treatments: antibiotics: vanco/zosyn/ceftriaxone and augmentin at d/c  Discharge Exam: Blood pressure 110/68, pulse (!) 101, temperature 98.2 F (36.8 C),  temperature source Oral, resp. rate 20, height 5\' 11"  (1.803 m), weight 81.1 kg, SpO2 95 %.   General appearance: alert, cooperative and no distress Heart: regular rate and rhythm Lungs: clear to auscultation bilaterally Abdomen: benign Extremities: no edema or calf tenderness Wound: dressings Clean, JP site ok   Disposition:  Discharge disposition: 01-Home or Self Care       Discharge Instructions    Discharge patient   Complete by: As directed    Discharge disposition: 01-Home or Self Care   Discharge patient date: 06/23/2020     Allergies as of 06/23/2020      Reactions   Bee Venom Swelling   Strawberry Extract Other (See Comments)      Medication List    TAKE these medications   acetaminophen 160 MG/5ML solution Commonly known as: TYLENOL Place 20.3 mLs (650 mg total) into feeding tube every 4 (four) hours as needed for fever (fever >100.5).   amoxicillin-clavulanate 875-125 MG tablet Commonly known as: AUGMENTIN Take 1 tablet by mouth 2 (two) times daily.   famotidine 40 MG/5ML suspension Commonly known as: PEPCID Take by mouth.   feeding supplement (OSMOLITE 1.5 CAL) Liqd Place 1,680 mLs into feeding tube daily. For 14 hours at night as directed What changed:   how much to take  additional instructions   lactose free nutrition Liqd Take 237 mLs by mouth 3 (three) times daily with meals. What changed:   how much to take  how to take this  when to take this   ferrous sulfate 325 (65 FE) MG tablet 1 tablet   fluticasone 50 MCG/ACT nasal spray Commonly known as: FLONASE Place 2 sprays into both nostrils daily.   free water Soln Place 75 mLs into feeding tube every 4 (four) hours.   liver oil-zinc oxide 40 % ointment Commonly known as: DESITIN Apply topically daily. Start taking on: June 24, 2020       Follow-up Information    Lightfoot, Lucile Crater, MD Follow up.   Specialty: Cardiothoracic Surgery Why: Please see discharge  paperwork for follow-up appointment with Dr. Kipp Brood.  Obtain a chest x-ray 1/2-hour prior to appointment at [facility].  It is located in the same office complex on the first floor. Contact information: [place] [address] [phone]               Signed: John Giovanni PA-C 06/23/2020, 2:55 PM

## 2020-06-16 NOTE — Hospital Course (Addendum)
  HPI   History of Present Illness:    Patient is a 65 year old male well-known to [facility] having undergone minimally invasive Ivor Lewis esophagectomy.  He has had multiple postprocedural issues including anastomotic leak with placement of an esophageal stent.  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  The stent was subsequently removed on 05/29/2020 by Dr. Kipp Brood.  He has been on dysphagia 1 diet with jejunostomy tube feedings.  Yesterday he developed drainage from a previous chest tube site that was both purulent and bloody.  He has been having sweats but no chills and no definite fevers.  Chest x-ray reveals a left pleural effusion with bilateral airspace disease consistent with atelectasis or possible infiltrates.  This is not dramatically different from most recent chest x-ray.  Additionally he does have a significant amount of maceration around the J-tube site.  Due to these findings and clinical deterioration it is felt that he should be admitted for further evaluation to include CT scan of the chest.  We will also place him on intravenous antibiotics.   Hospital course: The patient was admitted and started on intravenous antibiotics and chest CT did show findings consistent with fluid collection.  Interventional radiology was consulted to assist with placement of a drain which was done 06/15/2020.  Cultures have grown Proteus and Bacteroides and he is treated with IV vancomycin and Zosyn.  Nutrition has also been consulted to assist with J-tube feedings and protein malnutrition.  Wound care nurse has also been consulted to assist with dressing changes for macerated changes at the J-tube site.  Over time the patient has shown excellent progress.  His antibiotics were transitioned to intravenous Rocephin and eventually oral Augmentin which he will continue for at least 1 month post discharge.  He has undergone a calorie count to reevaluate  his dietary needs in hopes of discontinuing J-tube feeds.  Nutrition is also made recommendations for oral oral supplements.  His chest tube has been connected to a bulb drain.  He has remained afebrile with no significant leukocytosis.  He has a chronic anemia which is stable.  Renal function has also remained within normal limits.

## 2020-06-17 ENCOUNTER — Inpatient Hospital Stay (HOSPITAL_COMMUNITY): Payer: 59

## 2020-06-17 LAB — GLUCOSE, CAPILLARY
Glucose-Capillary: 146 mg/dL — ABNORMAL HIGH (ref 70–99)
Glucose-Capillary: 141 mg/dL — ABNORMAL HIGH (ref 70–99)
Glucose-Capillary: 122 mg/dL — ABNORMAL HIGH (ref 70–99)
Glucose-Capillary: 97 mg/dL (ref 70–99)
Glucose-Capillary: 75 mg/dL (ref 70–99)

## 2020-06-17 LAB — BASIC METABOLIC PANEL
Anion gap: 7 (ref 5–15)
BUN: 8 mg/dL (ref 8–23)
CO2: 24 mmol/L (ref 22–32)
Calcium: 8.1 mg/dL — ABNORMAL LOW (ref 8.9–10.3)
Chloride: 105 mmol/L (ref 98–111)
Creatinine, Ser: 0.38 mg/dL — ABNORMAL LOW (ref 0.61–1.24)
GFR, Estimated: >60 mL/min (ref >60)
Glucose, Bld: 148 mg/dL — ABNORMAL HIGH (ref 70–99)
Potassium: 4.8 mmol/L (ref 3.5–5.1)
Sodium: 136 mmol/L (ref 135–145)

## 2020-06-17 NOTE — Progress Notes (Addendum)
      [address]       [place] [address]             [phone]           Subjective: Patient just waking up this am and is without specific complaint this am.  Objective: Vital signs in last 24 hours: Temp:  [97.6 F (36.4 C)-98.4 F (36.9 C)] 98.2 F (36.8 C) (02/12 0351) Pulse Rate:  [68-79] 79 (02/11 2325) Cardiac Rhythm: Normal sinus rhythm (02/12 0410) Resp:  [17-20] 20 (02/12 0351) BP: (91-103)/(56-67) 103/61 (02/11 2325) SpO2:  [95 %-100 %] 98 % (02/11 2007) Weight:  [79.2 kg] 79.2 kg (02/12 0355)      Intake/Output from previous day: 02/11 0701 - 02/12 0700 In: 1440 [P.O.:120; NG/GT:1320] Out: 1570 [Urine:1500; Drains:70]   Physical Exam:  Cardiovascular: RRR Pulmonary: Clear to auscultation on the right and slightly diminished left base Abdomen: Soft, non tender, bowel sounds present. Extremities: No lower extremity edema. Wounds: Clean and dry.  No erythema or signs of infection. Chest Tube: to waterseal, no air leak, 70 cc drainage in Pleura VAC this am  Lab Results: CBC:No results for input(s): WBC, HGB, HCT, PLT in the last 72 hours. BMET:  Recent Labs    06/16/20 0012 06/17/20 0254  NA 132* 136  K 3.9 4.8  CL 101 105  CO2 21* 24  GLUCOSE 209* 148*  BUN 8 8  CREATININE 0.58* 0.38*  CALCIUM 8.3* 8.1*    PT/INR:  No results for input(s): LABPROT, INR in the last 72 hours. ABG:  INR: Will add last result for INR, ABG once components are confirmed Will add last 4 CBG results once components are confirmed  Assessment/Plan:  1. CV - SR, first degree heart block. 2.  Pulmonary - On room air. S/p 12 French left pigtail chest tube placement. Left pigtail chest tube to water seal and 70 cc recorded last 12 hours. CXR this am appears to show improvement in left pleural consolidation. Check CXR in am. 3. GI-TFs, dysphagia I diet.  4. ID-continue Ceftriaxone. Culture from left chest tube drainage shows Proteus Mirabilis.  Await susceptibilities 5. CBGs 153/146/141. No history of diabetes but on TFs  Donielle M [facility] 06/17/2020,8:00 AM [phone]  I have seen and examined the patient and agree with the assessment and plan as outlined.  Rexene Alberts, MD 06/17/2020 11:40 AM

## 2020-06-18 ENCOUNTER — Inpatient Hospital Stay (HOSPITAL_COMMUNITY): Payer: 59

## 2020-06-18 LAB — BASIC METABOLIC PANEL
Anion gap: 7 (ref 5–15)
BUN: 7 mg/dL — ABNORMAL LOW (ref 8–23)
CO2: 28 mmol/L (ref 22–32)
Calcium: 8.6 mg/dL — ABNORMAL LOW (ref 8.9–10.3)
Chloride: 102 mmol/L (ref 98–111)
Creatinine, Ser: 0.47 mg/dL — ABNORMAL LOW (ref 0.61–1.24)
GFR, Estimated: >60 mL/min (ref >60)
Glucose, Bld: 124 mg/dL — ABNORMAL HIGH (ref 70–99)
Potassium: 4.9 mmol/L (ref 3.5–5.1)
Sodium: 137 mmol/L (ref 135–145)

## 2020-06-18 LAB — GLUCOSE, CAPILLARY
Glucose-Capillary: 122 mg/dL — ABNORMAL HIGH (ref 70–99)
Glucose-Capillary: 88 mg/dL (ref 70–99)
Glucose-Capillary: 85 mg/dL (ref 70–99)
Glucose-Capillary: 108 mg/dL — ABNORMAL HIGH (ref 70–99)

## 2020-06-18 NOTE — Progress Notes (Addendum)
      [address]       [place], [address]             [phone]           Subjective: Patient without specific complaint this am.  Objective: Vital signs in last 24 hours: Temp:  [97.6 F (36.4 C)-98.1 F (36.7 C)] 97.6 F (36.4 C) (02/13 0833) Pulse Rate:  [69-89] 89 (02/13 0833) Cardiac Rhythm: Normal sinus rhythm (02/12 1904) Resp:  [12-20] 20 (02/13 0833) BP: (92-114)/(59-73) 105/62 (02/13 0833) SpO2:  [95 %-100 %] 97 % (02/13 0833) Weight:  [79.6 kg] 79.6 kg (02/13 0506)      Intake/Output from previous day: 02/12 0701 - 02/13 0700 In: 3422 [P.O.:200; LF/YB:0175; IV Piggyback:100] Out: 780 [Urine:750]   Physical Exam:  Cardiovascular: RRR Pulmonary: Clear to auscultation on the right and slightly diminished left base Abdomen: Soft, non tender, bowel sounds present. Extremities: No lower extremity edema. Wounds: Clean and dry.  No erythema or signs of infection. Chest Tube: to waterseal, no air leak  Lab Results: CBC:No results for input(s): WBC, HGB, HCT, PLT in the last 72 hours. BMET:  Recent Labs    06/17/20 0254 06/18/20 0148  NA 136 137  K 4.8 4.9  CL 105 102  CO2 24 28  GLUCOSE 148* 124*  BUN 8 7*  CREATININE 0.38* 0.47*  CALCIUM 8.1* 8.6*    PT/INR:  No results for input(s): LABPROT, INR in the last 72 hours. ABG:  INR: Will add last result for INR, ABG once components are confirmed Will add last 4 CBG results once components are confirmed  Assessment/Plan:  1. CV - SR, first degree heart block. 2.  Pulmonary - On room air. S/p 12 French left pigtail chest tube placement. Left pigtail chest tube to water seal and 40 cc recorded last 12 hours. CXR this am appears stable (unchanged bibasilar atelectasis and small left pleural).  Check CXR in am. 3. GI-TFs, dysphagia I diet.  4. ID-continue Ceftriaxone. Culture from left chest tube drainage shows Proteus Mirabilis.  5. CBGs 97/75/122. No history of diabetes but on  TFs  Donielle M [facility] 06/18/2020,9:08 AM [phone]  I have seen and examined the patient and agree with the assessment and plan as outlined.  Rexene Alberts, MD 06/18/2020 11:32 AM

## 2020-06-19 ENCOUNTER — Inpatient Hospital Stay (HOSPITAL_COMMUNITY): Payer: 59

## 2020-06-19 ENCOUNTER — Encounter (HOSPITAL_COMMUNITY): Payer: Self-pay | Admitting: Thoracic Surgery (Cardiothoracic Vascular Surgery)

## 2020-06-19 LAB — AEROBIC/ANAEROBIC CULTURE W GRAM STAIN (SURGICAL/DEEP WOUND)

## 2020-06-19 LAB — GLUCOSE, CAPILLARY
Glucose-Capillary: 139 mg/dL — ABNORMAL HIGH (ref 70–99)
Glucose-Capillary: 93 mg/dL (ref 70–99)
Glucose-Capillary: 153 mg/dL — ABNORMAL HIGH (ref 70–99)
Glucose-Capillary: 136 mg/dL — ABNORMAL HIGH (ref 70–99)

## 2020-06-19 MED ORDER — IOHEXOL 300 MG/ML  SOLN
75.0000 mL | Freq: Once | INTRAMUSCULAR | Status: AC | PRN
  Administered 2020-06-19: 75 mL via INTRAVENOUS

## 2020-06-19 NOTE — Consult Note (Signed)
[facility] Inpatient Consult   06/19/2020  [place] ISSAC 1956/04/03 [id number]   [facility] [[facility]]  Accountable Care Organization [ACO] Patient: Brett Dawson with less than 30 days readmission hospitalization noted   Patient is currently active with [facility] [[facility]] Care Management for chronic disease management services.  Patient has been engaged by [facility] RN Care Coordinator as well as the Embedded Care Team noted..  Our community based plan of care has focused on disease management and community resource support. Review of [facility] Care Coordination reveals that there has been some difficulty maintaining contact with member for ongoing community support and disease management needs.    Plan:   Will alert Inpatient Transition Of Care [TOC] team member to make aware that [facility] Care Management following. Will follow for the [facility] Embedded team for follow up.   Of note, [facility] Care Management services does not replace or interfere with any services that are needed or arranged by inpatient [facility] care management team.  For additional questions or referrals please contact:   [name], RN BSN CCM [facility]  [phone] business mobile phone Toll free office [phone]  Fax number: [phone] [email or web address]

## 2020-06-19 NOTE — Progress Notes (Addendum)
[address]       [place] [address]             [phone]         Subjective: Feels ok, no specific c/o  Objective: Vital signs in last 24 hours: Temp:  [97.6 F (36.4 C)-98.1 F (36.7 C)] 97.7 F (36.5 C) (02/14 0356) Pulse Rate:  [73-89] 77 (02/14 0356) Cardiac Rhythm: Normal sinus rhythm (02/13 1900) Resp:  [18-20] 19 (02/14 0356) BP: (98-107)/(62-77) 103/62 (02/14 0356) SpO2:  [92 %-97 %] 92 % (02/14 0356) Weight:  [82.3 kg] 82.3 kg (02/14 0356)  Hemodynamic parameters for last 24 hours:    Intake/Output from previous day: 02/13 0701 - 02/14 0700 In: 10 [I.V.:10] Out: 1150 [Urine:1100; Chest Tube:50] Intake/Output this shift: No intake/output data recorded.  General appearance: alert, cooperative and no distress Heart: regular rate and rhythm Lungs: mildly dim in bases Wound: J-tube site and CT site improving appearance  Lab Results: No results for input(s): WBC, HGB, HCT, PLT in the last 72 hours. BMET: Recent Labs    06/17/20 0254 06/18/20 0148  NA 136 137  K 4.8 4.9  CL 105 102  CO2 24 28  GLUCOSE 148* 124*  BUN 8 7*  CREATININE 0.38* 0.47*  CALCIUM 8.1* 8.6*    PT/INR: No results for input(s): LABPROT, INR in the last 72 hours. ABG    Component Value Date/Time   PHART 7.454 (H) 02/25/2020 0500   HCO3 26.5 02/25/2020 0500   TCO2 27 02/24/2020 1944   ACIDBASEDEF 1.0 02/24/2020 1547   O2SAT 93.7 02/25/2020 0500   CBG (last 3)  Recent Labs    06/18/20 1736 06/18/20 2205 06/19/20 0622  GLUCAP 85 108* 139*    Meds Scheduled Meds: . enoxaparin (LOVENOX) injection  40 mg Subcutaneous Q24H  . feeding supplement  237 mL Oral BID BM  . feeding supplement (OSMOLITE 1.5 CAL)  1,680 mL Per Tube Q24H  . insulin aspart  0-15 Units Subcutaneous TID WC  . liver oil-zinc oxide   Topical Daily  . metoCLOPramide  10 mg Per J Tube TID  . potassium chloride  40 mEq Per Tube BID  . sodium chloride flush  3 mL Intravenous  Q12H   Continuous Infusions: . sodium chloride    . cefTRIAXone (ROCEPHIN)  IV 2 g (06/18/20 1005)   PRN Meds:.sodium chloride, acetaminophen **OR** acetaminophen, HYDROcodone-acetaminophen, magnesium hydroxide, sodium chloride flush  Xrays DG CHEST PORT 1 VIEW  Result Date: 06/18/2020 CLINICAL DATA:  Left empyema follow-up. EXAM: PORTABLE CHEST 1 VIEW COMPARISON:  Chest x-ray from yesterday. FINDINGS: Unchanged pigtailed chest tube at the left lung base. Unchanged bibasilar atelectasis and small left pleural effusion. No pneumothorax. The heart size and mediastinal contours are within normal limits. Normal pulmonary vascularity. No acute osseous abnormality. Prior thoracic duct embolization. IMPRESSION: 1. Unchanged bibasilar atelectasis and small left pleural effusion. Electronically Signed   By: Titus Dubin M.D.   On: 06/18/2020 09:01   Results for orders placed or performed during the hospital encounter of 06/13/20  Aerobic Culture (superficial specimen)     Status: None   Collection Time: 06/13/20  6:05 PM   Specimen: Exit Site; Abscess  Result Value Ref Range Status   Specimen Description EXIT SITE CHEST LEFT  Final   Special Requests CHEST TUBE DRAINAGE  Final   Gram Stain   Final    FEW WBC PRESENT, PREDOMINANTLY PMN NO ORGANISMS SEEN Performed at  [facility], [place] [address], [place], [place] [address]    Culture FEW PROTEUS MIRABILIS  Final   Report Status 06/15/2020 FINAL  Final   Organism ID, Bacteria PROTEUS MIRABILIS  Final      Susceptibility   Proteus mirabilis - MIC*    AMPICILLIN <=2 SENSITIVE Sensitive     CEFAZOLIN <=4 SENSITIVE Sensitive     CEFEPIME <=0.12 SENSITIVE Sensitive     CEFTAZIDIME <=1 SENSITIVE Sensitive     CEFTRIAXONE <=0.25 SENSITIVE Sensitive     CIPROFLOXACIN <=0.25 SENSITIVE Sensitive     GENTAMICIN <=1 SENSITIVE Sensitive     IMIPENEM 2 SENSITIVE Sensitive     TRIMETH/SULFA <=20 SENSITIVE Sensitive     AMPICILLIN/SULBACTAM <=2  SENSITIVE Sensitive     PIP/TAZO <=4 SENSITIVE Sensitive     * FEW PROTEUS MIRABILIS  Aerobic/Anaerobic Culture (surgical/deep wound)     Status: None (Preliminary result)   Collection Time: 06/15/20 11:38 AM   Specimen: Abscess  Result Value Ref Range Status   Specimen Description ABSCESS  Final   Special Requests PLEURAL, LEFT  Final   Gram Stain   Final    ABUNDANT WBC PRESENT, PREDOMINANTLY PMN ABUNDANT GRAM POSITIVE COCCI IN PAIRS FEW GRAM NEGATIVE RODS Performed at [facility], [address], [place],  [address]    Culture   Final    ABUNDANT PROTEUS MIRABILIS MIXED ANAEROBIC FLORA PRESENT.  CALL LAB IF FURTHER IID REQUIRED.    Report Status PENDING  Incomplete   Organism ID, Bacteria PROTEUS MIRABILIS  Final      Susceptibility   Proteus mirabilis - MIC*    AMPICILLIN <=2 SENSITIVE Sensitive     CEFAZOLIN 8 SENSITIVE Sensitive     CEFEPIME <=0.12 SENSITIVE Sensitive     CEFTAZIDIME <=1 SENSITIVE Sensitive     CEFTRIAXONE <=0.25 SENSITIVE Sensitive     CIPROFLOXACIN <=0.25 SENSITIVE Sensitive     GENTAMICIN <=1 SENSITIVE Sensitive     IMIPENEM 2 SENSITIVE Sensitive     TRIMETH/SULFA <=20 SENSITIVE Sensitive     AMPICILLIN/SULBACTAM <=2 SENSITIVE Sensitive     PIP/TAZO <=4 SENSITIVE Sensitive     * ABUNDANT PROTEUS MIRABILIS    Assessment/Plan:  1 afeb, VSS, sats good on RA 2 on Rocephin 3 CT drainage 50 cc/24 hours 4 BS adeq control 5 on TF's 6 poss d/c tube and transition to po abx soon      LOS: 6 days    John Giovanni PA-C Pager [phone] 06/19/2020   Agree with above. CT scan shows persistent loculation with some improvement, but residual fluid collection. We will continue chest tube drainage. We will advance diet, and titrate tube feeds down. We will continue IV antibiotics for now.  Taziyah Iannuzzi [name]

## 2020-06-20 ENCOUNTER — Other Ambulatory Visit: Payer: Self-pay | Admitting: *Deleted

## 2020-06-20 LAB — BASIC METABOLIC PANEL
Anion gap: 7 (ref 5–15)
BUN: 11 mg/dL (ref 8–23)
CO2: 27 mmol/L (ref 22–32)
Calcium: 8.6 mg/dL — ABNORMAL LOW (ref 8.9–10.3)
Chloride: 100 mmol/L (ref 98–111)
Creatinine, Ser: 0.42 mg/dL — ABNORMAL LOW (ref 0.61–1.24)
GFR, Estimated: >60 mL/min (ref >60)
Glucose, Bld: 127 mg/dL — ABNORMAL HIGH (ref 70–99)
Potassium: 4.6 mmol/L (ref 3.5–5.1)
Sodium: 134 mmol/L — ABNORMAL LOW (ref 135–145)

## 2020-06-20 LAB — GLUCOSE, CAPILLARY
Glucose-Capillary: 140 mg/dL — ABNORMAL HIGH (ref 70–99)
Glucose-Capillary: 87 mg/dL (ref 70–99)
Glucose-Capillary: 88 mg/dL (ref 70–99)
Glucose-Capillary: 99 mg/dL (ref 70–99)

## 2020-06-20 LAB — CBC
HCT: 32.7 % — ABNORMAL LOW (ref 39.0–52.0)
Hemoglobin: 10.2 g/dL — ABNORMAL LOW (ref 13.0–17.0)
MCH: 27.8 pg (ref 26.0–34.0)
MCHC: 31.2 g/dL (ref 30.0–36.0)
MCV: 89.1 fL (ref 80.0–100.0)
Platelets: 449 10*3/uL — ABNORMAL HIGH (ref 150–400)
RBC: 3.67 MIL/uL — ABNORMAL LOW (ref 4.22–5.81)
RDW: 16.5 % — ABNORMAL HIGH (ref 11.5–15.5)
WBC: 8.1 10*3/uL (ref 4.0–10.5)
nRBC: 0 % (ref 0.0–0.2)

## 2020-06-20 MED ORDER — PROSOURCE PLUS PO LIQD
30.0000 mL | Freq: Three times a day (TID) | ORAL | Status: DC
  Administered 2020-06-20 – 2020-06-23 (×8): 30 mL via ORAL
  Filled 2020-06-20 (×7): qty 30

## 2020-06-20 MED ORDER — OSMOLITE 1.5 CAL PO LIQD
1680.0000 mL | ORAL | Status: DC
  Administered 2020-06-20 – 2020-06-22 (×3): 1680 mL
  Filled 2020-06-20 (×4): qty 2000

## 2020-06-20 MED ORDER — BOOST PLUS PO LIQD
237.0000 mL | Freq: Three times a day (TID) | ORAL | Status: DC
  Administered 2020-06-20 – 2020-06-23 (×7): 237 mL via ORAL
  Filled 2020-06-20 (×10): qty 237

## 2020-06-20 NOTE — Progress Notes (Addendum)
[address]       [place] [address]             [phone]         Subjective: frustrated  Objective: Vital signs in last 24 hours: Temp:  [97.7 F (36.5 C)-98.1 F (36.7 C)] 98.1 F (36.7 C) (02/15 0432) Pulse Rate:  [72-90] 74 (02/15 0432) Cardiac Rhythm: Normal sinus rhythm (02/14 1902) Resp:  [17-19] 19 (02/15 0432) BP: (95-108)/(61-70) 96/64 (02/15 0432) SpO2:  [93 %-96 %] 93 % (02/15 0432) Weight:  [81.7 kg] 81.7 kg (02/15 0432)  Hemodynamic parameters for last 24 hours:    Intake/Output from previous day: 02/14 0701 - 02/15 0700 In: 6283 [P.O.:240; I.V.:3; NG/GT:3550] Out: 2060 [TDVVO:1607; Chest Tube:140] Intake/Output this shift: No intake/output data recorded.  General appearance: alert, cooperative and no distress Heart: regular rate and rhythm Lungs: clear to auscultation bilaterally Abdomen: benign Extremities: no edema or calf tenderness Wound: CT /JP sites stable (j-tube, not JP)  Lab Results: Recent Labs    06/20/20 0059  WBC 8.1  HGB 10.2*  HCT 32.7*  PLT 449*   BMET:  Recent Labs    06/18/20 0148 06/20/20 0059  NA 137 134*  K 4.9 4.6  CL 102 100  CO2 28 27  GLUCOSE 124* 127*  BUN 7* 11  CREATININE 0.47* 0.42*  CALCIUM 8.6* 8.6*    PT/INR: No results for input(s): LABPROT, INR in the last 72 hours. ABG    Component Value Date/Time   PHART 7.454 (H) 02/25/2020 0500   HCO3 26.5 02/25/2020 0500   TCO2 27 02/24/2020 1944   ACIDBASEDEF 1.0 02/24/2020 1547   O2SAT 93.7 02/25/2020 0500   CBG (last 3)  Recent Labs    06/19/20 1604 06/19/20 2127 06/20/20 0625  GLUCAP 153* 136* 140*    Meds Scheduled Meds: . enoxaparin (LOVENOX) injection  40 mg Subcutaneous Q24H  . feeding supplement  237 mL Oral BID BM  . feeding supplement (OSMOLITE 1.5 CAL)  1,680 mL Per Tube Q24H  . insulin aspart  0-15 Units Subcutaneous TID WC  . liver oil-zinc oxide   Topical Daily  . metoCLOPramide  10 mg Per J Tube  TID  . potassium chloride  40 mEq Per Tube BID  . sodium chloride flush  3 mL Intravenous Q12H   Continuous Infusions: . sodium chloride    . cefTRIAXone (ROCEPHIN)  IV 2 g (06/19/20 0835)   PRN Meds:.sodium chloride, acetaminophen **OR** acetaminophen, HYDROcodone-acetaminophen, magnesium hydroxide, sodium chloride flush  Xrays CT CHEST W CONTRAST  Result Date: 06/19/2020 CLINICAL DATA:  Empyema post drainage catheter placement 06/15/2020. History of esophagectomy. EXAM: CT CHEST WITH CONTRAST TECHNIQUE: Multidetector CT imaging of the chest was performed during intravenous contrast administration. CONTRAST:  33mL OMNIPAQUE IOHEXOL 300 MG/ML  SOLN COMPARISON:  Chest CT 06/13/2020. FINDINGS: Cardiovascular: Mild atherosclerosis of the aorta and coronary arteries. No acute vascular findings. No evidence of pulmonary embolism. The heart size is normal. There is no pericardial effusion. Mediastinum/Nodes: There are no enlarged mediastinal, hilar or axillary lymph nodes.Stable mildly prominent precarinal and subcarinal lymph nodes. 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 The thyroid gland and trachea demonstrate no significant findings. Lungs/Pleura: Percutaneous pigtail catheter has been placed within the left pleural space posteriorly. The previously demonstrated complex left pleural fluid collection has decreased in overall volume, with residual complex pleural thickening and air in the pleural space. There is a trace simple right pleural  effusion. Airspace opacities with volume loss and air bronchograms in both lower lobes are unchanged. Upper abdomen: No acute findings are seen within the visualized upper abdomen. There are cystic lesions in both kidneys as well as the liver and anterior spleen. A percutaneous jejunal feeding tube is partially imaged. Musculoskeletal/Chest wall: There is no chest wall mass or suspicious osseous finding. Multilevel thoracic spondylosis.  Healing sinus tract laterally in the lower left chest wall related to previously removed chest tube. IMPRESSION: 1. Decreased volume of complex left pleural fluid collection consistent with partially drained empyema following pigtail catheter placement. 2. Stable bilateral lower lobe airspace opacities with volume loss and air bronchograms, likely atelectasis although potentially pneumonia. 3. Trace simple right pleural effusion. 4. 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 5. Aortic Atherosclerosis (ICD10-I70.0). Electronically Signed   By: Richardean Sale M.D.   On: 06/19/2020 15:26   DG CHEST PORT 1 VIEW  Result Date: 06/19/2020 CLINICAL DATA:  Pleural effusion. EXAM: PORTABLE CHEST 1 VIEW COMPARISON:  June 18, 2020. FINDINGS: The heart size and mediastinal contours are within normal limits. Left-sided chest tube is unchanged in position. No pneumothorax is noted. Stable right basilar subsegmental atelectasis or infiltration is noted. Stable left basilar opacity is noted concerning for atelectasis or infiltrate with associated effusion. The visualized skeletal structures are unremarkable. IMPRESSION: Stable left-sided chest tube without pneumothorax. Stable right basilar subsegmental atelectasis or infiltration. Stable left basilar opacity is noted concerning for atelectasis or infiltrate with associated effusion. Electronically Signed   By: Marijo Conception M.D.   On: 06/19/2020 08:03   Results for orders placed or performed during the hospital encounter of 06/13/20  Aerobic Culture (superficial specimen)     Status: None   Collection Time: 06/13/20  6:05 PM   Specimen: Exit Site; Abscess  Result Value Ref Range Status   Specimen Description EXIT SITE CHEST LEFT  Final   Special Requests CHEST TUBE DRAINAGE  Final   Gram Stain   Final    FEW WBC PRESENT, PREDOMINANTLY PMN NO ORGANISMS SEEN Performed at [facility], [address], [place], [place]  [address]    Culture FEW PROTEUS MIRABILIS  Final   Report Status 06/15/2020 FINAL  Final   Organism ID, Bacteria PROTEUS MIRABILIS  Final      Susceptibility   Proteus mirabilis - MIC*    AMPICILLIN <=2 SENSITIVE Sensitive     CEFAZOLIN <=4 SENSITIVE Sensitive     CEFEPIME <=0.12 SENSITIVE Sensitive     CEFTAZIDIME <=1 SENSITIVE Sensitive     CEFTRIAXONE <=0.25 SENSITIVE Sensitive     CIPROFLOXACIN <=0.25 SENSITIVE Sensitive     GENTAMICIN <=1 SENSITIVE Sensitive     IMIPENEM 2 SENSITIVE Sensitive     TRIMETH/SULFA <=20 SENSITIVE Sensitive     AMPICILLIN/SULBACTAM <=2 SENSITIVE Sensitive     PIP/TAZO <=4 SENSITIVE Sensitive     * FEW PROTEUS MIRABILIS  Aerobic/Anaerobic Culture (surgical/deep wound)     Status: None   Collection Time: 06/15/20 11:38 AM   Specimen: Abscess  Result Value Ref Range Status   Specimen Description ABSCESS  Final   Special Requests PLEURAL, LEFT  Final   Gram Stain   Final    ABUNDANT WBC PRESENT, PREDOMINANTLY PMN ABUNDANT GRAM POSITIVE COCCI IN PAIRS FEW GRAM NEGATIVE RODS Performed at [facility], [address] [place], [place] [address]    Culture   Final    ABUNDANT PROTEUS MIRABILIS MIXED ANAEROBIC FLORA PRESENT.  CALL LAB IF FURTHER IID REQUIRED.    Report Status 06/19/2020 FINAL  Final   Organism ID, Bacteria PROTEUS MIRABILIS  Final      Susceptibility   Proteus mirabilis - MIC*    AMPICILLIN <=2 SENSITIVE Sensitive     CEFAZOLIN 8 SENSITIVE Sensitive     CEFEPIME <=0.12 SENSITIVE Sensitive     CEFTAZIDIME <=1 SENSITIVE Sensitive     CEFTRIAXONE <=0.25 SENSITIVE Sensitive     CIPROFLOXACIN <=0.25 SENSITIVE Sensitive     GENTAMICIN <=1 SENSITIVE Sensitive     IMIPENEM 2 SENSITIVE Sensitive     TRIMETH/SULFA <=20 SENSITIVE Sensitive     AMPICILLIN/SULBACTAM <=2 SENSITIVE Sensitive     PIP/TAZO <=4 SENSITIVE Sensitive     * ABUNDANT PROTEUS MIRABILIS   Assessment/Plan:  1 CT scan findings noted, plan as outlined to cont tube  and IV abx for now, conts TF's, soft diet 2 afeb, VSS 3 sats good on RA 4 no leukocytosis 5 anemia improved 6 renal fxn ok, creat slightly low, good UOP 7 BS adeq controled 8 CT 140 cc /24 h 9 lovenox for DVT ppx 10 routine rehab      LOS: 7 days    John Giovanni PA-C Pager [phone] 06/20/2020   Agree with above Will transition off of tube feeds Will start PO abx Plan to dc with chest tube  Lajuana Matte

## 2020-06-20 NOTE — Patient Outreach (Signed)
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 Lavina Hamman, RN, BSN, [place] Coordinator Office number [phone] Mobile number [phone]  Main [facility] number [phone] Fax number [phone]

## 2020-06-20 NOTE — Progress Notes (Signed)
Calorie Count Note  RD placed calorie count envelope on the patient's door. Nursing to document percent consumed for each item on the patient's meal tray ticket, percent of any supplement or snack pt consumes, and keep documentation in envelope for RD to review.   Nocturnal feeding:  Osmolite 1.5 @ 95 ml/hr to run over 14 hours from 2000 to 1000 (total of 1330 ml)  Provides: 1995 kcals, 83 grams protein, 1013 ml free water. Meets 77% of kcal needs and 66% of protein needs.   Patient does not like the taste of Ensure but is willing to drink Boost Plus. Will make changes in MAR. Slightly decreased tube feeding per providers request.   If patient unable to demonstrate consistent PO, will need to increase back to meet 100% of needs. Patient will need to continue tube feeding at home regardless of PO intake as he is severely malnourished.   Oral supplements:  D/C Ensure due to patient dislike  Boost Plus chocolate TID- Each supplement provides 360kcal and 14g protein ProSource Plus 30 ml TID, each supplement provides 100 kcals and 15 grams protein  Mariana Single RD, LDN Clinical Nutrition Pager listed in [place]

## 2020-06-21 LAB — GLUCOSE, CAPILLARY
Glucose-Capillary: 113 mg/dL — ABNORMAL HIGH (ref 70–99)
Glucose-Capillary: 98 mg/dL (ref 70–99)
Glucose-Capillary: 92 mg/dL (ref 70–99)
Glucose-Capillary: 109 mg/dL — ABNORMAL HIGH (ref 70–99)

## 2020-06-21 MED ORDER — AMOXICILLIN-POT CLAVULANATE 875-125 MG PO TABS
1.0000 | ORAL_TABLET | Freq: Two times a day (BID) | ORAL | Status: DC
  Administered 2020-06-21 – 2020-06-23 (×5): 1 via ORAL
  Filled 2020-06-21 (×6): qty 1

## 2020-06-21 NOTE — Progress Notes (Signed)
Calorie Count Note: Day 1  48-hour calorie count ordered. Calorie count started on 2/15 at lunch meal. Please see day 1 results below.  Spoke with pt at bedside who reports that he feels like he is eating well. Pt states that he did receive his tube feeding overnight but that it ended at 0830 this morning rather than 1000 per order. Pt reports ProSource Plus was administered via his J-tube rather than PO. Discussed with pt and RN that ProSource Plus is an oral nutrition supplement and can cause J-tube to clog due to nectar-thick consistency. Pt states that he will try the ProSource Plus via PO route but that if he does not like it then he will not take it.  Diet: GI soft diet, thin liquids Supplements:  - Boost Plus TID, each supplement provides 360 kcal and 14 grams of protein - ProSource Plus 30 ml po TID, each supplement provides 100 kcal and 15 grams of protein  Day 1: 06/20/20 Lunch: 355 kcal, 13 grams of protein 06/20/20 Dinner: 665 kcal, 27 grams of protein 06/21/20 Breakfast: 642 kcal, 33 grams of protein Supplements: 360 kcal, 14 grams of protein (1 Boost Plus supplement)  Day 1 total 24-hour intake: 2022 kcal (78% of minimum estimated needs)  87 grams of protein (70% of minimum estimated needs)  Nutrition Diagnosis: Severe Malnutrition related to chronic illness,cancer and cancer related treatments as evidenced by severe fat depletion,severe muscle depletion,percent weight loss (29% of weight loss in 3.5 months).  Goal: Patient will meet greater than or equal to 90% of their needs  Intervention: - Continue 48-hour calorie count, RD will follow up with day 2 results tomorrow (06/22/20) - Continue Boost Plus TID - Continue ProSource Plus TID - Continue nocturnal tube feeds via J-tube of Osmolite 1.5 @ 95 x 14 hours from 2000 to [place], MS, RD, LDN Inpatient Clinical Dietitian Please see [facility] for contact information.

## 2020-06-21 NOTE — Progress Notes (Signed)
Patient laying in bed in stable condition.  Patient ate approximately 100% of lunch meal and tolerated well.  Patient states pain level of 0 on 0-10 pain scale.  Will continue to follow orders.

## 2020-06-21 NOTE — Progress Notes (Addendum)
[address]       [place] [address]             [phone]         Subjective:  trying to eat more po, supplements ordeed  Objective: Vital signs in last 24 hours: Temp:  [97.6 F (36.4 C)-98.1 F (36.7 C)] 97.7 F (36.5 C) (02/16 0343) Pulse Rate:  [73-81] 73 (02/16 0343) Cardiac Rhythm: Normal sinus rhythm (02/15 2005) Resp:  [14-20] 18 (02/16 0343) BP: (94-106)/(62-71) 95/64 (02/16 0343) SpO2:  [95 %-96 %] 95 % (02/15 1616) Weight:  [82.2 kg] 82.2 kg (02/16 0343)  Hemodynamic parameters for last 24 hours:    Intake/Output from previous day: 02/15 0701 - 02/16 0700 In: 280 [P.O.:280] Out: 2690 [Urine:2670; Chest Tube:20] Intake/Output this shift: No intake/output data recorded.  General appearance: alert, cooperative and no distress Heart: regular rate and rhythm Lungs: clear to auscultation bilaterally Abdomen: benign Wound: J- tube site stable, CT site ok  Lab Results: Recent Labs    06/20/20 0059  WBC 8.1  HGB 10.2*  HCT 32.7*  PLT 449*   BMET:  Recent Labs    06/20/20 0059  NA 134*  K 4.6  CL 100  CO2 27  GLUCOSE 127*  BUN 11  CREATININE 0.42*  CALCIUM 8.6*    PT/INR: No results for input(s): LABPROT, INR in the last 72 hours. ABG    Component Value Date/Time   PHART 7.454 (H) 02/25/2020 0500   HCO3 26.5 02/25/2020 0500   TCO2 27 02/24/2020 1944   ACIDBASEDEF 1.0 02/24/2020 1547   O2SAT 93.7 02/25/2020 0500   CBG (last 3)  Recent Labs    06/20/20 1615 06/20/20 2115 06/21/20 0628  GLUCAP 88 99 113*    Meds Scheduled Meds: . (feeding supplement) PROSource Plus  30 mL Oral TID BM  . enoxaparin (LOVENOX) injection  40 mg Subcutaneous Q24H  . feeding supplement (OSMOLITE 1.5 CAL)  1,680 mL Per Tube Q24H  . insulin aspart  0-15 Units Subcutaneous TID WC  . lactose free nutrition  237 mL Oral TID WC  . liver oil-zinc oxide   Topical Daily  . metoCLOPramide  10 mg Per J Tube TID  . potassium chloride   40 mEq Per Tube BID  . sodium chloride flush  3 mL Intravenous Q12H   Continuous Infusions: . sodium chloride    . cefTRIAXone (ROCEPHIN)  IV 2 g (06/20/20 0900)   PRN Meds:.sodium chloride, acetaminophen **OR** acetaminophen, HYDROcodone-acetaminophen, magnesium hydroxide, sodium chloride flush  Xrays CT CHEST W CONTRAST  Result Date: 06/19/2020 CLINICAL DATA:  Empyema post drainage catheter placement 06/15/2020. History of esophagectomy. EXAM: CT CHEST WITH CONTRAST TECHNIQUE: Multidetector CT imaging of the chest was performed during intravenous contrast administration. CONTRAST:  32mL OMNIPAQUE IOHEXOL 300 MG/ML  SOLN COMPARISON:  Chest CT 06/13/2020. FINDINGS: Cardiovascular: Mild atherosclerosis of the aorta and coronary arteries. No acute vascular findings. No evidence of pulmonary embolism. The heart size is normal. There is no pericardial effusion. Mediastinum/Nodes: There are no enlarged mediastinal, hilar or axillary lymph nodes.Stable mildly prominent precarinal and subcarinal lymph nodes. 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 The thyroid gland and trachea demonstrate no significant findings. Lungs/Pleura: Percutaneous pigtail catheter has been placed within the left pleural space posteriorly. The previously demonstrated complex left pleural fluid collection has decreased in overall volume, with residual complex pleural thickening and air in the pleural space. There is a trace simple  right pleural effusion. Airspace opacities with volume loss and air bronchograms in both lower lobes are unchanged. Upper abdomen: No acute findings are seen within the visualized upper abdomen. There are cystic lesions in both kidneys as well as the liver and anterior spleen. A percutaneous jejunal feeding tube is partially imaged. Musculoskeletal/Chest wall: There is no chest wall mass or suspicious osseous finding. Multilevel thoracic spondylosis. Healing sinus tract  laterally in the lower left chest wall related to previously removed chest tube. IMPRESSION: 1. Decreased volume of complex left pleural fluid collection consistent with partially drained empyema following pigtail catheter placement. 2. Stable bilateral lower lobe airspace opacities with volume loss and air bronchograms, likely atelectasis although potentially pneumonia. 3. Trace simple right pleural effusion. 4. 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 5. Aortic Atherosclerosis (ICD10-I70.0). Electronically Signed   By: Richardean Sale M.D.   On: 06/19/2020 15:26    Assessment/Plan:  1 afeb, VSS 2 sats good on RA 3 CT 20 cc / 24 h- transition to bulb or mini-express soon 4 pharmacy ID recs Augmentin for 3 weeks as po possible transition soon 5 Nutritionist assisting with dietary needs, calorie count being done  LOS: 8 days    John Giovanni PA-C Pager [phone] 06/21/2020  Agree with above Will transition to bulb prior to discharge Continue calorie counts Will transition to Augmentin today.  Anfernee Peschke [name]

## 2020-06-21 NOTE — Progress Notes (Addendum)
Andree Elk [facility] aware of order to change chest tube with [place] chest  Drain to JP Bulb attached as ordered to chest tube and JP bulb charged. Will monitor. Larcenia Holaday, Bettina Gavia RN

## 2020-06-22 ENCOUNTER — Telehealth: Payer: Self-pay | Admitting: Family Medicine

## 2020-06-22 LAB — GLUCOSE, CAPILLARY
Glucose-Capillary: 117 mg/dL — ABNORMAL HIGH (ref 70–99)
Glucose-Capillary: 142 mg/dL — ABNORMAL HIGH (ref 70–99)
Glucose-Capillary: 126 mg/dL — ABNORMAL HIGH (ref 70–99)
Glucose-Capillary: 83 mg/dL (ref 70–99)

## 2020-06-22 NOTE — Progress Notes (Addendum)
[address]       ,[place] [address]             [phone]         Subjective:  looks and feels better  Objective: Vital signs in last 24 hours: Temp:  [97.8 F (36.6 C)-98.1 F (36.7 C)] 98.1 F (36.7 C) (02/16 2336) Pulse Rate:  [60-89] 60 (02/16 2336) Cardiac Rhythm: Normal sinus rhythm (02/16 2001) Resp:  [16-23] 18 (02/16 2336) BP: (97-115)/(60-73) 115/63 (02/16 2336) SpO2:  [94 %-97 %] 97 % (02/16 1653)  Hemodynamic parameters for last 24 hours:    Intake/Output from previous day: 02/16 0701 - 02/17 0700 In: -  Out: 1360 [Urine:1350; Chest Tube:10] Intake/Output this shift: No intake/output data recorded.  General appearance: alert, cooperative and no distress Heart: regular rate and rhythm Lungs: clear to auscultation bilaterally Abdomen: benign  Lab Results: Recent Labs    06/20/20 0059  WBC 8.1  HGB 10.2*  HCT 32.7*  PLT 449*   BMET:  Recent Labs    06/20/20 0059  NA 134*  K 4.6  CL 100  CO2 27  GLUCOSE 127*  BUN 11  CREATININE 0.42*  CALCIUM 8.6*    PT/INR: No results for input(s): LABPROT, INR in the last 72 hours. ABG    Component Value Date/Time   PHART 7.454 (H) 02/25/2020 0500   HCO3 26.5 02/25/2020 0500   TCO2 27 02/24/2020 1944   ACIDBASEDEF 1.0 02/24/2020 1547   O2SAT 93.7 02/25/2020 0500   CBG (last 3)  Recent Labs    06/21/20 1130 06/21/20 1651 06/21/20 2114  GLUCAP 98 92 109*    Meds Scheduled Meds: . (feeding supplement) PROSource Plus  30 mL Oral TID BM  . amoxicillin-clavulanate  1 tablet Oral Q12H  . enoxaparin (LOVENOX) injection  40 mg Subcutaneous Q24H  . feeding supplement (OSMOLITE 1.5 CAL)  1,680 mL Per Tube Q24H  . insulin aspart  0-15 Units Subcutaneous TID WC  . lactose free nutrition  237 mL Oral TID WC  . liver oil-zinc oxide   Topical Daily  . metoCLOPramide  10 mg Per J Tube TID  . potassium chloride  40 mEq Per Tube BID  . sodium chloride flush  3 mL Intravenous  Q12H   Continuous Infusions: . sodium chloride     PRN Meds:.sodium chloride, acetaminophen **OR** acetaminophen, HYDROcodone-acetaminophen, magnesium hydroxide, sodium chloride flush  Xrays No results found.  Results for orders placed or performed during the hospital encounter of 06/13/20  Aerobic Culture (superficial specimen)     Status: None   Collection Time: 06/13/20  6:05 PM   Specimen: Exit Site; Abscess  Result Value Ref Range Status   Specimen Description EXIT SITE CHEST LEFT  Final   Special Requests CHEST TUBE DRAINAGE  Final   Gram Stain   Final    FEW WBC PRESENT, PREDOMINANTLY PMN NO ORGANISMS SEEN Performed at [facility], [address] [place], [place] [address]    Culture FEW PROTEUS MIRABILIS  Final   Report Status 06/15/2020 FINAL  Final   Organism ID, Bacteria PROTEUS MIRABILIS  Final      Susceptibility   Proteus mirabilis - MIC*    AMPICILLIN <=2 SENSITIVE Sensitive     CEFAZOLIN <=4 SENSITIVE Sensitive     CEFEPIME <=0.12 SENSITIVE Sensitive     CEFTAZIDIME <=1 SENSITIVE Sensitive     CEFTRIAXONE <=0.25 SENSITIVE Sensitive     CIPROFLOXACIN <=  0.25 SENSITIVE Sensitive     GENTAMICIN <=1 SENSITIVE Sensitive     IMIPENEM 2 SENSITIVE Sensitive     TRIMETH/SULFA <=20 SENSITIVE Sensitive     AMPICILLIN/SULBACTAM <=2 SENSITIVE Sensitive     PIP/TAZO <=4 SENSITIVE Sensitive     * FEW PROTEUS MIRABILIS  Aerobic/Anaerobic Culture (surgical/deep wound)     Status: None   Collection Time: 06/15/20 11:38 AM   Specimen: Abscess  Result Value Ref Range Status   Specimen Description ABSCESS  Final   Special Requests PLEURAL, LEFT  Final   Gram Stain   Final    ABUNDANT WBC PRESENT, PREDOMINANTLY PMN ABUNDANT GRAM POSITIVE COCCI IN PAIRS FEW GRAM NEGATIVE RODS Performed at [facility], [place] [address] [place], [place] [address]    Culture   Final    ABUNDANT PROTEUS MIRABILIS MIXED ANAEROBIC FLORA PRESENT.  CALL LAB IF FURTHER IID  REQUIRED.    Report Status 06/19/2020 FINAL  Final   Organism ID, Bacteria PROTEUS MIRABILIS  Final      Susceptibility   Proteus mirabilis - MIC*    AMPICILLIN <=2 SENSITIVE Sensitive     CEFAZOLIN 8 SENSITIVE Sensitive     CEFEPIME <=0.12 SENSITIVE Sensitive     CEFTAZIDIME <=1 SENSITIVE Sensitive     CEFTRIAXONE <=0.25 SENSITIVE Sensitive     CIPROFLOXACIN <=0.25 SENSITIVE Sensitive     GENTAMICIN <=1 SENSITIVE Sensitive     IMIPENEM 2 SENSITIVE Sensitive     TRIMETH/SULFA <=20 SENSITIVE Sensitive     AMPICILLIN/SULBACTAM <=2 SENSITIVE Sensitive     PIP/TAZO <=4 SENSITIVE Sensitive     * ABUNDANT PROTEUS MIRABILIS      Assessment/Plan:  1 afeb, VSS 2 sats good on RA 3 BS controlled 4 CT 10 cc drainage 5 dietary intake improving- calorie count in progress 6 cont augmentin     LOS: 9 days    John Giovanni PA-C Pager [phone] 06/22/2020   Agree with above. Not quite at goal in regards to nutritional status will close. Chest tube to bulb with minimal output. We will perform calorie counts for 1 more day.  Sharlynn Seckinger [name]

## 2020-06-22 NOTE — Progress Notes (Signed)
Calorie Count Note: Day 2  Calorie count ordered. Calorie count started on 2/15 at lunch meal. Please see day 2 results below.  Patient consumed 75% of each meal over the last 24 hours. Did not drink Boost Plus as it was sent from pharmacy at lukewarm temp. RN to place supplements in refrigerator prior to giving to patient. Continue nocturnal feedings as patient has not been able to reach 100% of nutrition needs with PO intake alone and he remains severely malnourished. Will continue calorie count for one more day.   Diet: GI soft diet, thin liquids Supplements: Boost Plus TID, ProSource Plus 30 ml TID  Day 2: 06/21/20 Lunch: 564 kcal, 29 grams of protein 06/21/20 Dinner: 554 kcal, 23 grams of protein 06/22/20 Breakfast: 487 kcal, 20 grams of protein Supplements: 200 kcal, 30 grams protein  Day 1 total 24-hour intake: 1805 kcal (70% of minimum estimated needs)  102 grams of protein (82% of minimum estimated needs)  Estimated Nutritional Needs:  Kcal:  2600-2800 Protein:  125-140 g Fluid:  >/= 2.2 L  Nutrition Diagnosis: Severe Malnutrition related to chronic illness,cancer and cancer related treatments as evidenced by severe fat depletion,severe muscle depletion,percent weight loss (29% of weight loss in 3.5 months).  Goal: Patient will meet greater than or equal to 90% of their needs  Intervention: - Continue calorie count for one more day, RD will follow up with day 3 results tomorrow (06/23/20) - Continue Boost Plus TID - Continue ProSource Plus TID - Continue nocturnal tube feeds via J-tube of Osmolite 1.5 @ 95 x 14 hours from 2000 to [place], LDN Clinical Nutrition Pager listed in [place]

## 2020-06-22 NOTE — Discharge Instructions (Signed)
Surgical Drain Home Care Surgical drains are used to remove extra fluid that normally builds up in a surgical wound after surgery. A surgical drain helps to heal a surgical wound. Different kinds of surgical drains include:  Active drains. These drains use suction to pull drainage away from the surgical wound. Drainage flows through a tube to a container outside of the body. With these drains, you need to keep the bulb or the drainage container flat (compressed) at all times, except while you empty it. Flattening the bulb or container creates suction.  Passive drains. These drains allow fluid to drain naturally, by gravity. Drainage flows through a tube to a bandage (dressing) or a container outside of the body. Passive drains do not need to be emptied. A drain is placed during surgery. Right after surgery, drainage is usually bright red and a little thicker than water. The drainage may gradually turn yellow or pink and become thinner. It is likely that your health care provider will remove the drain when the drainage stops or when the amount decreases to 1-2 Tbsp (15-30 mL) during a 24-hour period. Supplies needed:  Tape.  Germ-free cleaning solution (sterile saline).  Cotton swabs.  Split gauze drain sponge: 4 x 4 inches (10 x 10 cm).  Gauze square: 4 x 4 inches (10 x 10 cm). How to care for your surgical drain Care for your drain as told by your health care provider. This is important to help prevent infection. If your drain is placed at your back, or any other hard-to-reach area, ask another person to assist you in performing the following tasks: General care  Keep the skin around the drain dry and covered with a dressing at all times.  Check your drain area every day for signs of infection. Check for: ? Redness, swelling, or pain. ? Pus or a bad smell. ? Cloudy drainage. ? Tenderness or pressure at the drain exit site. Changing the dressing Follow instructions from your health care  provider about how to change your dressing. Change your dressing at least once a day. Change it more often if needed to keep the dressing dry. Make sure you: 1. Gather your supplies. 2. Wash your hands with soap and water before you change your dressing. If soap and water are not available, use hand sanitizer. 3. Remove the old dressing. Avoid using scissors to do that. 4. Wash your hands with soap and water again after removing the old dressing. 5. Use sterile saline to clean your skin around the drain. You may need to use a cotton swab to clean the skin. 6. Place the tube through the slit in a drain sponge. Place the drain sponge so that it covers your wound. 7. Place the gauze square or another drain sponge on top of the drain sponge that is on the wound. Make sure the tube is between those layers. 8. Tape the dressing to your skin. 9. Tape the drainage tube to your skin 1-2 inches (2.5-5 cm) below the place where the tube enters your body. Taping keeps the tube from pulling on any stitches (sutures) that you have. 10. Wash your hands with soap and water. 11. Write down the color of your drainage and how often you change your dressing. How to empty your active drain 1. Make sure that you have a measuring cup that you can empty your drainage into. 2. Wash your hands with soap and water. If soap and water are not available, use hand sanitizer. 3. Loosen   any pins or clips that hold the tube in place. 4. If your health care provider tells you to strip the tube to prevent clots and tube blockages: ? Hold the tube at the skin with one hand. Use your other hand to pinch the tubing with your thumb and first finger. ? Gently move your fingers down the tube while squeezing very lightly. This clears any drainage, clots, or tissue from the tube. ? You may need to do this several times each day to keep the tube clear. Do not pull on the tube. 5. Open the bulb cap or the drain plug. Do not touch the inside  of the cap or the bottom of the plug. 6. Turn the device upside down and gently squeeze. 7. Empty all of the drainage into the measuring cup. 8. Compress the bulb or the container and replace the cap or the plug. To compress the bulb or the container, squeeze it firmly in the middle while you close the cap or plug the container. 9. Write down the amount of drainage that you have in each 24-hour period. If you have less than 2 Tbsp (30 mL) of drainage during 24 hours, contact your health care provider. 10. Flush the drainage down the toilet. 11. Wash your hands with soap and water.   Contact a health care provider if:  You have redness, swelling, or pain around your drain area.  You have pus or a bad smell coming from your drain area.  You have a fever or chills.  The skin around your drain is warm to the touch.  The amount of drainage that you have is increasing instead of decreasing.  You have drainage that is cloudy.  There is a sudden stop or a sudden decrease in the amount of drainage that you have.  Your drain tube falls out.  Your active drain does not stay compressed after you empty it. Summary  Surgical drains are used to remove extra fluid that normally builds up in a surgical wound after surgery.  Different kinds of surgical drains include active drains and passive drains. Active drains use suction to pull drainage away from the surgical wound, and passive drains allow fluid to drain naturally.  It is important to care for your drain to prevent infection. If your drain is placed at your back, or any other hard-to-reach area, ask another person to assist you.  Contact your health care provider if you have redness, swelling, or pain around your drain area. This information is not intended to replace advice given to you by your health care provider. Make sure you discuss any questions you have with your health care provider. Document Revised: 05/27/2018 Document Reviewed:  05/27/2018 Elsevier Patient Education  2021 [place] A soft-food eating plan includes foods that are safe and easy to chew and swallow. Your health care provider or dietitian can help you find foods and flavors that fit into this plan. Follow this plan until your health care provider or dietitian says it is safe to start eating other foods and food textures. What are tips for following this plan? General guidelines  Take small bites of food, or cut food into pieces about  inch or smaller. Bite-sized pieces of food are easier to chew and swallow.  Eat moist foods. Avoid overly dry foods.  Avoid foods that: ? Are difficult to swallow, such as dry, chunky, crispy, or sticky foods. ? Are difficult to chew, such as hard, tough, or  stringy foods. ? Contain nuts, seeds, or fruits.  Follow instructions from your dietitian about the types of liquids that are safe for you to swallow. You may be allowed to have: ? Thick liquids only. This includes only liquids that are thicker than honey. ? Thin and thick liquids. This includes all beverages and foods that become liquid at room temperature.  To make thick liquids: ? Purchase a commercial liquid thickening powder. These are available at grocery stores and pharmacies. ? Mix the thickener into liquids according to instructions on the label. ? Purchase ready-made thickened liquids. ? Thicken soup by pureeing, straining to remove chunks, and adding flour, potato flakes, or corn starch. ? Add commercial thickener to foods that become liquid at room temperature, such as milk shakes, yogurt, ice cream, gelatin, and sherbet.  Ask your health care provider whether you need to take a fiber supplement.   Cooking  Cook meats so they stay tender and moist. Use methods like braising, stewing, or baking in liquid.  Cook vegetables and fruit until they are soft enough to be mashed with a fork.  Peel soft, fresh fruits such as  peaches, nectarines, and melons.  When making soup, make sure chunks of meat and vegetables are smaller than  inch.  Reheat leftover foods slowly so that a tough crust does not form. What foods are allowed? The items listed below may not be a complete list. Talk with your dietitian about what dietary choices are best for you. Grains Breads, muffins, pancakes, or waffles moistened with syrup, jelly, or butter. Dry cereals well-moistened with milk. Moist, cooked cereals. Well-cooked pasta and rice. Vegetables All soft-cooked vegetables. Shredded lettuce. Fruits All canned and cooked fruits. Soft, peeled fresh fruits. Strawberries. Dairy Milk. Cream. Yogurt. Cottage cheese. Soft cheese without the rind. Meats and other protein foods Tender, moist ground meat, poultry, or fish. Meat cooked in gravy or sauces. Eggs. Sweets and desserts Ice cream. Milk shakes. Sherbet. Pudding. Fats and oils Butter. Margarine. Olive, canola, sunflower, and grapeseed oil. Smooth salad dressing. Smooth cream cheese. Mayonnaise. Gravy. What foods are not allowed? The items listed bemay not be a complete list. Talk with your dietitian about what dietary choices are best for you. Grains Coarse or dry cereals, such as bran, granola, and shredded wheat. Tough or chewy crusty breads, such as [place] bread or baguettes. Breads with nuts, seeds, or fruit. Vegetables All raw vegetables. Cooked corn. Cooked vegetables that are tough or stringy. Tough, crisp, fried potatoes and potato skins. Fruits Fresh fruits with skins or seeds, or both, such as apples, pears, and grapes. Stringy, high-pulp fruits, such as papaya, pineapple, coconut, and mango. Fruit leather and all dried fruit. Dairy Yogurt with nuts or coconut. Meats and other protein foods Hard, dry sausages. Dry meat, poultry, or fish. Meats with gristle. Fish with bones. Fried meat or fish. Lunch meat and hotdogs. Nuts and seeds. Chunky peanut butter or other  nut butters. Sweets and desserts Cakes or cookies that are very dry or chewy. Desserts with dried fruit, nuts, or coconut. Fried pastries. Very rich pastries. Fats and oils Cream cheese with fruit or nuts. Salad dressings with seeds or chunks. Summary  A soft-food eating plan includes foods that are safe and easy to swallow. Generally, the foods should be soft enough to be mashed with a fork.  Avoid foods that are dry, hard to chew, crunchy, sticky, stringy, or crispy.  Ask your health care provider whether you need to thicken your liquids and  if you need to take a fiber supplement. This information is not intended to replace advice given to you by your health care provider. Make sure you discuss any questions you have with your health care provider. Document Revised: 08/13/2018 Document Reviewed: 06/25/2016 Elsevier Patient Education  2021 [place]. How to Care for a Feeding Tube A feeding tube is a tube used to give medicine, water, and liquid food. A person may have this tube if she or he has trouble swallowing or cannot take food or medicine by mouth. Supplies needed to care for the tube site:  Clean gloves.  Clean washcloth, gauze pads, or soft paper towel.  Cotton swabs.  A skin barrier ointment or cream, such as petroleum jelly.  Soap and water.  Pre-cut foam pads or gauze (for around the tube).  Tube tape.  An anchoring device (optional). How to care for the tube site 1. Have all supplies ready and close to you. 2. Wash your hands with soap and water for at least 20 seconds. 3. Put on the gloves. 4. Change any pad or gauze near the tube if: ? It is dirty. ? It is wet. ? It has been there for more than one day. 5. Check the skin around the tube. Call the doctor if you see any of these: ? Red skin. ? A rash. ? Swelling. ? Leaking fluid. ? Extra skin. 6. Dip the gauze and cotton swabs in water and soap. 7. Use the cotton swabs to wipe the skin that is  closest to the tube. 8. Use the gauze pads to wipe the rest of the skin near the tube. 9. Rinse with water. 10. Dry the area with a clean washcloth, dry gauze pad, or soft paper towel. 11. If the skin is red, use a cotton swab to put on a skin barrier cream or ointment. Put it on by making little circles. Do not apply antibiotic ointments at the tube site. 12. Put a new pre-cut foam pad or gauze around the tube. If there is no fluid leaking at the tube site, you do not need a pad or gauze. 13. Tape down the edges. 14. Use tape or an anchoring device to attach the tube to the skin. Do this for comfort or as told. Each time you use tape, put it in a different place. 15. Help the person sit halfway up (about a 30- to 45-degree angle). 16. Throw away used supplies. 17. Take off your gloves. 18. Wash your hands with soap and water for at least 20 seconds.      Supplies needed to flush a feeding tube:  Clean gloves.  A clean 60 mL syringe that connects to the feeding tube.  A towel.  Germ-free (sterile) or purified water. Follow these rules: ? Use germ-free water if:  Your body's defense system (immune system) is weak and you have trouble getting better from infections (are immunocompromised).  You do not know how many chemicals are in your water. ? Do not use water from lakes or other bodies of water unless you treat it or filter it first. ? To make drinking water pure by boiling:  Boil water for 1 minute or longer. Keep a lid over the water while it boils.  Let the water cool to room temperature before you use it. How to flush a feeding tube 1. Have all supplies ready and close to you. 2. Wash your hands with soap and water for at least 20 seconds. 3.  Put on gloves. 4. Pull 30 mL of water into the syringe. 5. Before you push water into the tube (flush the tube), put the towel under the tube to catch any fluid leaks. 6. Bend or kink the feeding tube while you do one of these  things: ? Disconnect it from the feeding-bag tubing. ? Take off the cap at the end of the tube. 7. Put the tip of the syringe into the end of the feeding tube. 8. Stop bending the tube. 9. Use the syringe to slowly put the water in the tube. If the water will not go in the tube: ? Have the person lie on his or her left side. ? Try putting the water in the tube again. ? Do not push hard to make the water go in. 10. Take out the syringe and put the cap on the tube. 11. Throw away used supplies. 12. Take off your gloves. 13. Wash your hands with soap and water for at least 20 seconds.      General tips and recommendations Caring for the tube  If the person has a foam pad or gauze near the tube, change it: ? Every day. ? When it is dirty. ? When it is wet.  Do not put antibiotic ointments by the tube. Flushing the tube  Do not use a syringe that is smaller than 60 mL.  Flush the tube at all of these times: ? Before you give medicine. ? Between medicines. ? After the person gets the last medicine before a feeding.  Do not mix medicines with formula. Do not mix medicines with other medicines.  Completely flush medicines through the tube. That way, they will not mix with formula. Contact a doctor if:  The tube gets blocked or clogged.  You find any of these on the skin around the tube site: ? Red skin. ? A rash. ? Swelling. ? Leaking fluid. ? Extra skin. Summary  A feeding tube is a tube used to give medicine, water, and liquid food. A person may have this tube if she or he has trouble swallowing or cannot take food or medicine by mouth.  Follow the doctor's instructions to care for the tube site and flush the tube every day.  Contact a doctor if the tube gets blocked or clogged. This information is not intended to replace advice given to you by your health care provider. Make sure you discuss any questions you have with your health care provider. Document Revised:  08/19/2019 Document Reviewed: 08/19/2019 Elsevier Patient Education  2021 [employer].

## 2020-06-22 NOTE — Telephone Encounter (Signed)
   Telephone encounter was:  Unsuccessful.  06/22/2020 Name: Brett Dawson MRN: [id number] DOB: 10-15-1955  Unsuccessful outbound call made today to assist with:  outreach to fill out forms for financial help with medical bills.   Outreach Attempt:  3rd Attempt.  Referral closed unable to contact patient.  A HIPAA compliant voice message was left requesting a return call.  Instructed patient to call back at [phone]. Pt has been in hospital since 06/13/20. He has been provided with the form for financial help through [facility] and CG has given referral to [facility] for follow up. New referral to be placed if patient still needs help moving forward.    [place], Care Management Phone: [phone] Email: [email or web address]

## 2020-06-23 ENCOUNTER — Ambulatory Visit: Payer: Self-pay | Admitting: Thoracic Surgery (Cardiothoracic Vascular Surgery)

## 2020-06-23 LAB — GLUCOSE, CAPILLARY
Glucose-Capillary: 135 mg/dL — ABNORMAL HIGH (ref 70–99)
Glucose-Capillary: 161 mg/dL — ABNORMAL HIGH (ref 70–99)
Glucose-Capillary: 196 mg/dL — ABNORMAL HIGH (ref 70–99)

## 2020-06-23 MED ORDER — AMOXICILLIN-POT CLAVULANATE 875-125 MG PO TABS: 1.0000 | ORAL_TABLET | Freq: Two times a day (BID) | ORAL | 0 refills | Status: AC

## 2020-06-23 MED ORDER — ZINC OXIDE 40 % EX OINT: TOPICAL_OINTMENT | Freq: Every day | CUTANEOUS | 0 refills | Status: DC

## 2020-06-23 MED ORDER — BOOST PLUS PO LIQD: 237.0000 mL | Freq: Three times a day (TID) | ORAL | 0 refills | Status: DC

## 2020-06-23 MED ORDER — OSMOLITE 1.5 CAL PO LIQD: 1680.0000 mL | ORAL | 1 refills | Status: DC

## 2020-06-23 NOTE — Progress Notes (Signed)
Nutrition Follow Up   DOCUMENTATION CODES:   Severe malnutrition in context of chronic illness  INTERVENTION:   Home Nocturnal Tube Feeding:   Osmolite 1.5 @ 95 ml/hr to run over 12 hours from 2000 to 0800 (total of 1140 ml- equates to 4.5 cans daily)  Provides: 1710 kcals, 71 grams protein, 868 ml free water. Meets 65% of kcal needs and 57% of protein needs.   Boost Plus chocolate TID- Each supplement provides 360kcal and 14g protein.    NUTRITION DIAGNOSIS:   Severe Malnutrition related to chronic illness,cancer and cancer related treatments as evidenced by severe fat depletion,severe muscle depletion,percent weight loss (29% of weight loss in 3.5 months).  Ongoing  GOAL:   Patient will meet greater than or equal to 90% of their needs  Progressing   MONITOR:   PO intake,Skin,Supplement acceptance,Weight trends,TF tolerance,Labs  REASON FOR ASSESSMENT:   Consult Assessment of nutrition requirement/status,Enteral/tube feeding initiation and management,Wound healing  ASSESSMENT:   32 YOM admitted for postoperative wound infection from previous chest tube site as well as J- tube maceration. 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 Pt is also s/p Ivor Lewis esophagectomy and J-tube placement on 02/24/20.Esophageal stent placed and since removed 01/24. Pt with J-tube for nutrition.   Intake has progressed well over the last three days. Protein intake needs improvement. Discussed with patient that though intake has progressed he still needs to administer nocturnal feedings as his nutrition needs remain high. Explained to patient that he needs to provide at the very minimum four cans of tube feeding via pump each day. Patient willing to do so. RD encouraged PO supplementation and to follow up with cancer center RD for further recommendation.   Day 3: 06/22/20 Lunch: 923  kcal, 34 grams of protein 06/22/20 Dinner: 750 kcal, 20 grams of protein 06/23/20 Breakfast: 357 kcal, 16 grams of protein Supplements: 360 kcal, 16 grams protein  Day 3 total:  2390 kcal (92% of minimum estimated needs)  86 grams of protein (69% of minimum estimated needs)  Admission weight: 77 kg  Current weight: 81.1 kg   UOP: 575 ml x 24 hrs   Medications: SS novolog, 5 mg reglan TID, 40 mEq KCl BID Labs: Na 134 (L) CBG 93-140  Diet Order:   Diet Order            DIET SOFT Room service appropriate? Yes; Fluid consistency: Thin  Diet effective now                 EDUCATION NEEDS:   Education needs have been addressed (education on Dysphagia 1 diet)  Skin:  Skin Assessment: Skin Integrity Issues: Skin Integrity Issues:: Incisions Incisions: Closed - left flank  Last BM:  2/16  Height:   Ht Readings from Last 1 Encounters:  06/13/20 5\' 11"  (1.803 m)    Weight:   Wt Readings from Last 1 Encounters:  06/23/20 81.1 kg    Ideal Body Weight:  78.2 kg  BMI:  Body mass index is 24.94 kg/m.  Estimated Nutritional Needs:   Kcal:  2600-2800   Protein:  125-140 g   Fluid:  >/= 2.2 L  Mariana Single RD, LDN Clinical Nutrition Pager listed in [place]

## 2020-06-23 NOTE — Plan of Care (Signed)
  Problem: Education: Goal: Knowledge of General Education information will improve Description: Including pain rating scale, medication(s)/side effects and non-pharmacologic comfort measures Outcome: Adequate for Discharge   

## 2020-06-23 NOTE — Progress Notes (Signed)
      [address]       [place] [address]             [phone]         Subjective: conts calorie count , should have results today Feels well  Objective: Vital signs in last 24 hours: Temp:  [98 F (36.7 C)-98.5 F (36.9 C)] 98.3 F (36.8 C) (02/18 0801) Pulse Rate:  [82-91] 88 (02/18 0801) Cardiac Rhythm: Normal sinus rhythm (02/17 2003) Resp:  [16-20] 20 (02/18 0801) BP: (98-106)/(55-72) 102/68 (02/18 0801) SpO2:  [94 %-96 %] 95 % (02/18 0801) Weight:  [81.1 kg] 81.1 kg (02/18 0606)  Hemodynamic parameters for last 24 hours:    Intake/Output from previous day: 02/17 0701 - 02/18 0700 In: -  Out: 585 [Urine:575; Drains:10] Intake/Output this shift: No intake/output data recorded.  General appearance: alert, cooperative and no distress Heart: regular rate and rhythm Lungs: clear to auscultation bilaterally Abdomen: benign Extremities: no edema or calf tenderness Wound: dressings Clean, JP site ok  Lab Results: No results for input(s): WBC, HGB, HCT, PLT in the last 72 hours. BMET: No results for input(s): NA, K, CL, CO2, GLUCOSE, BUN, CREATININE, CALCIUM in the last 72 hours.  PT/INR: No results for input(s): LABPROT, INR in the last 72 hours. ABG    Component Value Date/Time   PHART 7.454 (H) 02/25/2020 0500   HCO3 26.5 02/25/2020 0500   TCO2 27 02/24/2020 1944   ACIDBASEDEF 1.0 02/24/2020 1547   O2SAT 93.7 02/25/2020 0500   CBG (last 3)  Recent Labs    06/22/20 1659 06/22/20 2134 06/23/20 0605  GLUCAP 126* 83 135*    Meds Scheduled Meds: . (feeding supplement) PROSource Plus  30 mL Oral TID BM  . amoxicillin-clavulanate  1 tablet Oral Q12H  . enoxaparin (LOVENOX) injection  40 mg Subcutaneous Q24H  . feeding supplement (OSMOLITE 1.5 CAL)  1,680 mL Per Tube Q24H  . insulin aspart  0-15 Units Subcutaneous TID WC  . lactose free nutrition  237 mL Oral TID WC  . liver oil-zinc oxide   Topical Daily  . metoCLOPramide  10 mg  Per J Tube TID  . potassium chloride  40 mEq Per Tube BID  . sodium chloride flush  3 mL Intravenous Q12H   Continuous Infusions: . sodium chloride     PRN Meds:.sodium chloride, acetaminophen **OR** acetaminophen, HYDROcodone-acetaminophen, magnesium hydroxide, sodium chloride flush  Xrays No results found.  Assessment/Plan:  1 afeb, VSS 2 sats 95 % on RA 3 BS adeq controlled 4 hopefully can d/c with plans per nutrition and po augmentin, tube connected to JP 5 TOC to assist with d/c needs   LOS: 10 days    John Giovanni PA-C Pager [phone] 06/23/2020

## 2020-06-23 NOTE — Progress Notes (Signed)
Discharge instructions (including medications) discussed with and copy provided to patient/caregiver 

## 2020-06-23 NOTE — Progress Notes (Signed)
Patient refused to be given Reglan and Potassium via his tube, states that it has been making his stomach "churn" all night for the last few nights and that he needs to sleep tonight.  Patient also refused to have vital signs taken after 11pm, stated that he does not want to be woken up, that he needs to get some sleep.  RN educated patient on the importance of having vital signs taken regularly, patient advised that he understood however did not want to have them taken tonight that had rather sleep.  RN continued to monitor patient and round on patient through out the night

## 2020-06-23 NOTE — Progress Notes (Signed)
Paper RX left on unit. Called pt x2 and left voicemail for the opportunity to pick up RX and 3 osmolite containers. Message left at 4:15pm and phone call has not been returned. Will hold onto belongings for 24 hours per Steward Ros., RN.

## 2020-06-27 ENCOUNTER — Other Ambulatory Visit: Payer: Self-pay | Admitting: *Deleted

## 2020-06-27 NOTE — Patient Outreach (Signed)
[facility]) Care Management  06/27/2020  Brett Dawson 12-25-1955 [id number]   [facility] third Unsuccessful outreach  Mr BLONG BUSK was referred to [facility] on 11/24/19 by [facility] Referral Reason:esophageal tumor with invasion into cradia  Admissions x3in last 6 months 06/13/20 Loculated septate left pleural effusion with placement of 12 French left pigtail chest tube (drain) on 06/15/20 05/29/20-05/30/20 s/p esophagogastroscopy, esophageal stent removal and esophagram with omnipaque by Dr Kipp Brood on 05/29/20 history of minimally invasive Ivor Lewis esophagectomy, anastomotic leak, Status post esophageal stent placement  10/21/21admission for robotic assisted Ivor-Lewis esophagectomy with jejunostomy tube placement by Dr Kipp Brood -discharged from [place] 04/21/20 home with advanced home health infusions, remote health services - J tube in place 11/23/19 EGD with upper endoscopy Dr [name] 7/16/21AnotherFeraheme dosegiven Pre referralAdmission5/14/21 to 09/19/19 syncopeHgb6.7 given blood+Ferahemeon 09/18/19 dxsystemic iron deficiency anemia observed and Hgb increased to 7.7 followed by [facility] gastroenterology     Transition of care services noted to be completed by primary care MD office staff Dr Margaretha Sheffield practice Transition of Care will be completed by primary care provider office who will refer to [facility] care management if needed.     Outreach attempt to the home number  No answer. THN RN CM left HIPAA [facility] Portability and Accountability Act) compliant voicemail message along with CM's contact info.   Plan: [facility] RN CM scheduled this patient for case closure per [facility] workflow  Unsuccessful outreach attempts on 06/01/20, 06/12/20 & 06/27/20 Sent THN unsuccessful outreach letter on 06/12/20   Joelene Millin L. Lavina Hamman, RN, BSN, [place] Coordinator Office number [phone] Mobile number [phone]   Main THN number [phone] Fax number [phone]

## 2020-06-28 ENCOUNTER — Other Ambulatory Visit: Payer: Self-pay | Admitting: Thoracic Surgery (Cardiothoracic Vascular Surgery)

## 2020-06-28 ENCOUNTER — Inpatient Hospital Stay: Payer: 59 | Attending: Hematology

## 2020-06-28 DIAGNOSIS — C155 Malignant neoplasm of lower third of esophagus: Secondary | ICD-10-CM

## 2020-06-28 NOTE — Progress Notes (Signed)
Nutrition Follow-up:  Patient with esophageal cancer s/p chemo and radiation.  S/p Ivor lewis esophagectomy and J-tube placement on 02/24/20.  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  Recent admission for wound infection at previous chest tube site and J-tube maceration.  Spoke with patient via phone.  Patient reports that he is giving TwoCal via J-tube at night (4-4.5 cartons/night).  Patient reports less gas, bloating, churning in stomach.  Reports regular bowel movement (more solid) daily and sometimes 2 per day.  Eating soft diet as well, typically 3 times per day and sometimes snacks between (pudding).  Yesterday ate oatmeal for breakfast, chicken salad sandwich for lunch and chicken with rice and carrots for dinner. Tolerated well.  Drinking boost shakes daily but trying to stretch them out as does not have many left and no money to purchase more.      Medications: taking antibiotics but can't afford other medications  Labs: reviewed  Anthropometrics:   Weight 178 lb on 2/18 (hospital weight)  181 lb 1/6 Dr Ernestina Penna office   Estimated Energy Needs  Kcals: 2600-2800 Protein: 125-140 g Fluid: > 2.2 L  NUTRITION DIAGNOSIS: Inadequate oral intake but improving   INTERVENTION:  Recommend continuing noctural tube feeding of Two Cal, 4-4.5 cartons per day.   Provides 2137 calories and 90 g protein, 747 ml free water from formula.  Patient drinking liquids orally.  Encouraged patient to continue eating oral diet of at least 3 meals per day with snacks to increase weight.  Encouraged high calorie and protein foods Encouraged oral nutrition supplements. RD will leave complimentary case of ensure plus at front desk of cancer center for patient to pick up on 2/25.   Patient has contact information    MONITORING, EVALUATION, GOAL: weight trends, intake, tube feeding   NEXT VISIT: March 16, phone call  Brett Dawson B. Zenia Resides, [place], [place] Registered  Dietitian [phone] (mobile)

## 2020-06-30 ENCOUNTER — Other Ambulatory Visit: Payer: Self-pay

## 2020-06-30 ENCOUNTER — Ambulatory Visit (INDEPENDENT_AMBULATORY_CARE_PROVIDER_SITE_OTHER): Payer: Self-pay | Admitting: Thoracic Surgery (Cardiothoracic Vascular Surgery)

## 2020-06-30 ENCOUNTER — Ambulatory Visit
Admission: RE | Admit: 2020-06-30 | Discharge: 2020-06-30 | Disposition: A | Discharge status: Home / Self Care | Payer: 59 | Source: Ambulatory Visit | Attending: Thoracic Surgery (Cardiothoracic Vascular Surgery) | Admitting: Thoracic Surgery (Cardiothoracic Vascular Surgery)

## 2020-06-30 ENCOUNTER — Encounter: Payer: Self-pay | Admitting: Thoracic Surgery (Cardiothoracic Vascular Surgery)

## 2020-06-30 VITALS — BP 102/68 | HR 90 | Resp 20 | Ht 71.0 in | Wt 178.0 lb

## 2020-06-30 DIAGNOSIS — C155 Malignant neoplasm of lower third of esophagus: Secondary | ICD-10-CM

## 2020-06-30 NOTE — Progress Notes (Signed)
     [address]       [name] [address]             [phone]       Brett Dawson comes in for his hospital follow-up appointment.  He was recently admitted with an empyema on the left side that was treated with a tube thoracostomy.  Since being home he is done well.  He is on a regular diet and is tolerating this well but currently is not meeting his caloric needs, and still on tube feeds.  He states that he has been gaining weight but his weight is 178 pounds today which is stable from his discharge.  Regards to the drainage is approximately 30 to 50 cc a day and is serosanguineous with some proteinaceous exudate in the bulb.  Overall he is doing well.  He is on his oral antibiotics will require another 2 to 3 weeks.  I will see him back in clinic in 3 weeks with a chest x-ray to assess the potential removing this drain as well as the feeding tube.  Terika Pillard [name]

## 2020-07-03 ENCOUNTER — Other Ambulatory Visit: Payer: Self-pay | Admitting: *Deleted

## 2020-07-03 NOTE — Patient Outreach (Signed)
[facility]) Care Management  07/03/2020  Brett Dawson May 11, 1955 [id number]   [facility] fourth Unsuccessful outreach  Brett Dawson was referred to [facility] on 11/24/19 by [facility] Referral Reason:esophageal tumor with invasion into cradia  Admissions x3in last 6 months 06/13/20 Loculated septate left pleural effusion with placement of 12 French left pigtail chest tube (drain) on 06/15/20 05/29/20-05/30/20 s/p esophagogastroscopy, esophageal stent removal and esophagram with omnipaque by Dr Kipp Brood on 05/29/20 history of minimally invasive Ivor Lewis esophagectomy, anastomotic leak, Status post esophageal stent placement  10/21/21admission for robotic assisted Ivor-Lewis esophagectomy with jejunostomy tube placement by Dr Kipp Brood -discharged from [place] 04/21/20 home with advanced home health infusions, remote health services - J tube in place 11/23/19 EGD with upper endoscopy Dr [name] 7/16/21AnotherFeraheme dosegiven Pre referralAdmission5/14/21 to 09/19/19 syncopeHgb6.7 given blood+Ferahemeon 09/18/19 dxsystemic iron deficiency anemia observed and Hgb increased to 7.7 followed by [facility] gastroenterology    Outreach attempt to the home number  No answer. THN RN CM left HIPAA [facility] Portability and Accountability Act) compliant voicemail message along with CM's contact info.   Plan: [facility] RN CM scheduled this patient for case closure per [facility] workflow  Unsuccessful outreach attempts on 06/01/20, 06/12/20,  06/27/20 & 07/03/20 Sent THN unsuccessful outreach letteron 06/12/20   Brett Ruffini L. Lavina Hamman, RN, BSN, [place] Coordinator Office number [phone] Mobile number [phone]  Main THN number [phone] Fax number [phone]

## 2020-07-03 NOTE — Patient Outreach (Addendum)
Brett Dawson) Care Management  07/03/2020  Brett Dawson 1955-07-21 [id number]  Brett Dawson incoming outreach from complex care patient Brett Dawson was referred to Brett Dawson on 11/24/19 by Brett Dawson Referral Reason:esophageal tumor with invasion into cradia  Admissions x3in last 6 months 06/13/20 Loculated septate left pleural effusion with placement of 12 French left pigtail chest tube (drain) on 06/15/20 05/29/20-05/30/20 s/p esophagogastroscopy, esophageal stent removal and esophagram with omnipaque by Brett Dawson on 05/29/20 history of minimally invasive Ivor Lewis esophagectomy, anastomotic leak, Status post esophageal stent placement  10/21/21admission for robotic assisted Ivor-Lewis esophagectomy with jejunostomy tube placement by Brett Dawson -discharged from Brett Dawson 04/21/20 home with advanced home health infusions, remote health services - J tube in place 11/23/19 EGD with upper endoscopy Brett Dawson 7/16/21AnotherFeraheme dosegiven Pre referralAdmission5/14/21 to 09/19/19 syncopeHgb6.7 given blood+Ferahemeon 09/18/19 dxsystemic iron deficiency anemia observed and Hgb increased to 7.7 followed by Brett Dawson gastroenterology  Brett Dawson returned Dawson call to [name]  After Unsuccessful outreach attempts on 06/01/20,06/12/20,  06/27/20 & 07/03/20 Sent Brett unsuccessful outreach letteron 06/12/20  Patient is able to verify HIPAA (Brett Dawson) identifiers Reviewed and addressed the purpose of the follow up call with the patient  Consent: Dawson [facility] ([place]) RN Dawson reviewed Brett Dawson services with patient. Patient gave verbal consent for services.   Follow up assessment Brett Dawson confirms outreach from [facility] care guide and Dawson visit from hospital Dawson during Dawson February 2022 admission He reports frustration with not receiving financial, medication, disability assistance  With assessment it has been determined he was  given resource for applying for food stamps but reports issues with Dawson online PIN number to access He uses the internet on his mobile phone He has not attempted to outreach via telephone   He reports being aware that his is not able to get supplemental security income (SSI) disability as " I have not been out of work long enough"  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 He has not been to work since October 2021. At this job he does not have benefits like long term or short term disability. No human resources person to consult. He pays for his own insurance independently    Transportation reports he has difficulty with transportation to get places  Brett Dawson not able to by food Brother helps with food  Rent Lives in another person's home  None of the home bills/mortgage are in his name reports having $500 in bank and  $400 (cash) goes to rent Denies he is behind on rent, nor about to be evicted  He reports he has had to borrow money for rent at intervals   Medically he still has Dawson chest tube and feeding tube in  Reports he is no longer receiving home health services  Saw Brett Dawson, surgeon on Friday 06/30/20 and reports his wound sites were "not looked at" He reports redness and soreness around feeding tube site only when assessed   He continues to have dietitian outreach    [facility] RN Dawson consulted with Brett Dawson care guide  Pt does not want to retired and would benefit from disability Pt had been given referral to Brett Dawson guide case closed after 3 unsuccessful follow up attempts on 06/22/20 and some hospitalization  Brett Dawson referral completed   Plan [facility] RN Dawson will follow up with patient within the next 14-21  business days Pt encouraged to return Dawson call to Brett Dawson prn Goals Addressed              This Visit's Progress     Patient Stated   .  [facility] Dawson) Find Help in My Community (pt-stated)        Timeframe:  Long-Range  Goal Priority:  High Start Date:         05/16/20                    Expected End Date:     11/13/20                  Follow Up Date 07/03/20   - begin Dawson notebook of services in my neighborhood or community - follow-up on any referrals for help I am given - make Dawson list of family or friends that I can call     Notes: 07/03/20 referred to [place] after consulted with [facility] care guide  06/01/20 collaboration with oncology Brett Dawson. Referred to care guide for possible financial resources     .  Brett Dawson) Keep Nausea and Vomiting Under Control-Cancer Posttreatment (pt-stated)        Timeframe:  Short-Term Goal Priority:  Medium Start Date:                    05/16/20         Expected End Date:             07/14/20          Follow Up Date 07/03/20   - keep track of which medicine helps and which does not - see Dawson dietitian to help plan meals - take medicine for nausea on Dawson regular schedule     Notes: 05/16/20 He "spits up Dawson white tasteless" secretion during the Dawson "frequently" only Not at night    .  [facility] Manage Fatigue (Tiredness-Cancer Posttreatment) (pt-stated)        Timeframe:  Short-Term Goal Priority:  Medium Start Date:                         05/16/20    Expected End Date:      07/14/20                 Follow Up Date 07/03/20   - eat healthy - maintain Dawson healthy weight   Notes: 05/16/20 He and [facility] RN Dawson went over Body Mass Index (BMI). On 05/11/20 he had Dawson BM I of 25.35 He reports telephonic outreaches from nutritionist, Brett Dawson He voiced he remains weak with no energy Last Hgb was 12  He and [facility] RN Dawson discussed his anemia and iron He reports he is NOT taking iron tablets as he does not want to have constipation Reports stools are presently small formed and he does not want to risk constipation He "spits up Dawson white tasteless" secretion during the Dawson "frequently"    .  [facility] Dawson) Manage My Medicine (pt-stated)        Timeframe:  Short-Term Goal Priority:  Medium Start Date:                  05/16/20            Expected End Date:                       Follow Up Date  07/03/20    - keep Dawson list of all the medicines I take; vitamins and herbals too     Notes:  05/16/20 He reports he is NOT taking iron tablets as he does not want to have constipation Reports stools are presently small formed and he does not want to risk constipation Has some medications at his pharmacy he has not picked up       [place]. Lavina Hamman, RN, BSN, [place] Coordinator Office number [phone] [facility] number [phone] Fax number [phone]

## 2020-07-06 ENCOUNTER — Other Ambulatory Visit: Payer: Self-pay | Admitting: *Deleted

## 2020-07-06 NOTE — Patient Outreach (Signed)
[facility]) Care Management  07/06/2020  Brett Dawson [birth year] [id number]   [place] coordination- [facility] unsuccessful outreach to patient   [facility] RN CM receive outreach on 3/1-2/22 from [name] and [name] on wheels referral was completed by G Pulliam Response from Meals on wheels program received on 07/05/20 confirming processing with meals to be delivered in 1-3 business days   07/06/20 Outreach to oncology dietitian/nutritionist [name] to follow up on an e-mail sent by [name] on 07/05/20, to collaborate and to make collateral inquiries about pt voiced concerns  No answer. THN RN CM left HIPAA HIPAA [facility] Portability and Accountability Act) compliant voicemail message along with CM's contact info.  07/06/20 THN Unsuccessful outreach to pt Outreach attempt to the home number  No answer. THN RN CM left HIPAA [facility] Portability and Accountability Act) compliant voicemail message along with CM's contact info. In voice message [facility] RN CM updated pt on outreach to care guide, [facility]N SW, [facility] leadership, [name], and the outreach by [employer] to [employer]als on wheels Updated him on scheduled meals to be delivered in 1-3 days to him Updated him on the online resource he can outreach to [phone] to speak with [employer] staff about food stamps, how to apply via telephone  Dr Kipp Brood was sent an EPIC in basket message to share the possible worsening symptoms [name] reported at his tube site on 07/03/20 to include pain and redness. He denied drainage and he not informed MD of these symptoms on 06/30/20  Plan [name] RN CM will follow up with patient within the next 14-21 business days [name] CM will again updated pt on outreaches with any return call from him and continue to collaborate with any staff    Joelene Millin L. Lavina Hamman, RN, BSN, [place] Coordinator Office number [phone] Mobile number [phone]   Main THN number [phone] Fax number [phone]

## 2020-07-07 ENCOUNTER — Other Ambulatory Visit: Payer: Self-pay | Admitting: *Deleted

## 2020-07-07 NOTE — Patient Outreach (Addendum)
[facility]) Care Management  07/07/2020  MIKAELE STEFFEK 05/03/1956 [id number]   [facility] incoming outreach from complex care patient  Mr JEMELL SCHANTZ was referred to [facility] on 11/24/19 by [facility] Referral Reason:esophageal tumor with invasion into cradia  Admissions x3in last 6 months 06/13/20-06/23/20 wound infectionLoculated septate left pleural effusion with placement of 12 French left pigtail chest tube (drain) on 06/15/20 05/29/20-05/30/20 s/p esophagogastroscopy, esophageal stent removal and esophagram with omnipaque by Dr Cliffton Asters on 05/29/20 history of minimally invasive Ivor Lewis esophagectomy, anastomotic leak, Status post esophageal stent placement  10/21/21admission for robotic assisted Ivor-Lewis esophagectomy with jejunostomy tube placement by Dr Cliffton Asters -discharged from [place] [facility] 04/21/20 home with advanced home health infusions, remote health services - J tube in place 11/23/19 EGD with upper endoscopy Dr Dulce Sellar 7/16/21AnotherFeraheme dosegiven Pre referralAdmission5/14/21 to 09/19/19 syncopeHgb6.7 given blood+Ferahemeon 09/18/19 dxsystemic iron deficiency anemia observed and Hgb increased to 7.7 followed by [facility] gastroenterology   Mr Amoroso returned a call to [facility] RN CM  After Unsuccessful outreach attempts on 07/06/20  Patient is able to verify HIPAA [facility] Portability and Accountability Act) identifiers Reviewed and addressed the purpose of the follow up call with the patient  Consent: [facility]) RN CM reviewed [employer] [facility] services with patient. Patient gave verbal consent for services Don't know how to save his voice messages after he does not know how to retrieve  Gave DSS number  Follow up assessment [facility] RN CM reviewed the message details left on 07/06/20 about outreach to care guide, [facility] SW, [facility] leadership, J. Freida Busman, and the outreach by Calina Patrie-Clark to Meals on wheels Mr Tivnan confirms he received  the message, listened to it, did not delete it but confirms he does not know how to retrieve messages in his mobile phone after he has listened to them.  He denies literacy issues but states he has issues with technology He was encouraged to outreach to his house mate/landlord or family to assist him with retrieving messages as he most likely has messages from other care/medical providers like [facility] care guide that needs follow outreaches.  Reviewed discussion with care guide to include that not retiring from his present job (that does not provided any insurance coverage, nor short term or long term disability) may prevent him meeting certain criteria for assistance from community resources He voiced understanding He was given the numbers for Madilyn Fireman, care guide [phone]) to outreach to discuss possible finance resources and to follow up. He was made aware that EPIC indicates outreach attempts were made but not returned He confirms today "I have used all my money"  He was given the numbers for [employer] DSS staff ([phone]) to outreach for assistance with food stamps, how to apply via telephone for services also to include medicaid and medical transportation services  Reviewed with him that [facility] may have medical transportation resource via his customer service number  He voiced understanding that transportation from medicaid or [facility] would be for medical reasons only   He was updated on EPIC message sent to Dr Cliffton Asters about his reported redness and pain of his tube site. [facility]N RN CM discussed in details with him the importance of reporting these symptoms to his MD or outreaching to his MD office to speak with the MD RN to prevent risk of infection He voiced understanding   He confirms he continues to get outreaches from Lanna Poche, dietitian He reports he has plenty  of tube feeding-has 3 boxes He reports he has not received outreaches or delivery of food from meals on  wheel at this time  [facility] RN CM offered to outreach with him to resources prn  Today he prefers to outreach to DSS first independently and have [facility] RN CM follow up with him  [facility] RN CM will follow up with Mr Christel within the next 7-10 business days Pt encouraged to return a call to [facility] RN CM prn  Hindy Perrault L. Noelle Penner, RN, BSN, CCM [facility] Telephonic Care Management Care Coordinator Office number [phone] Main [facility] number [phone] Fax number [phone]

## 2020-07-10 ENCOUNTER — Ambulatory Visit: Payer: Self-pay | Admitting: *Deleted

## 2020-07-18 ENCOUNTER — Inpatient Hospital Stay: Payer: 59 | Attending: Hematology

## 2020-07-18 NOTE — Progress Notes (Signed)
Nutrition Follow-up:  Patient with esophageal cancer s/p chemo and radiation.  S/p Ivor Lewis esophagectomy and J-tube placement on 02/24/20.  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 Admission on 06/13/20 for wound infection at previous chest tube site and J-tube maceration.  Patient not currently receiving treatment at the cancer center.   Spoke with patient via phone.  Patient reports that he is giving 4 cartons of TwoCal via J-tube (10pm-8am).  Tolerating without difficulty.  Says that he feels full during the day and does not want to eat much orally.  States that he has cases of Two Cal and Osmolite tube feeding. Typically eats oatmeal around 11am. Yesterday ate barbecue, slaw and green beans for late lunch. Evening meal was salmon, catfish and leftover green beans.  Patient reports that he has been receiving refrigerator meals [facility] assisting) that he heats in microwave.  Says that he should be receiving food stamps soon.  Reports that his brother cooks and brings him food. Patient has been drinking ensure enlive 1-2 per day.        Sees surgeon on 3/18  Medications: reviewed  Labs: reviewed  Anthropometrics:   Weight 178 lb on 2/25 (Dr Lightfoot's office)  At home 177 lb no shoes on, T-shirt and sweatpants.    Estimated Energy Needs  Kcals: 1448-1856 Protein: 125-140 g Fluid: > 2.2 L  NUTRITION DIAGNOSIS: Inadequate oral intake but improving   INTERVENTION:  With patient compliant of feeling full with tube feeding recommend decrease to 3 cartons per day via J-tube to see if oral intake will increase.  Patient is to weigh himself weekly and if weight drops he is to add carton of tube feeding back.   Goal is to increase oral intake and decrease tube feeding without weight loss.  Complimentary case of ensure enlive will be left at front desk of cancer center for patient to pick up on 3/18 Encouraged patient to continue drinking at least 2 per  day.   Tube feeding provides (3 cartons of Two Cal) and 2 cartons ensure enlive 2125 calories, 100 g protein Patient encouraged to increase calories and protein orally with reduction in tube feeding. Patient verbalized understanding.      MONITORING, EVALUATION, GOAL: weight trends, intake, tube feeding   NEXT VISIT: Thursday, April 7th after MD visit  Ester Mabe B. Zenia Resides, [place], [place] Registered Dietitian [phone] (mobile)

## 2020-07-20 ENCOUNTER — Other Ambulatory Visit: Payer: Self-pay | Admitting: *Deleted

## 2020-07-20 ENCOUNTER — Other Ambulatory Visit: Payer: Self-pay | Admitting: Thoracic Surgery (Cardiothoracic Vascular Surgery)

## 2020-07-20 DIAGNOSIS — C160 Malignant neoplasm of cardia: Secondary | ICD-10-CM

## 2020-07-20 NOTE — Patient Outreach (Signed)
[facility]) Care Management  07/20/2020  Brett Dawson 1956-02-03 [id number]   [facility] Unsuccessful outreach Brett Dawson was referred to [facility] on 11/24/19 by [facility] Referral Reason:esophageal tumor with invasion into cradia  Admissions x3in last 6 months 06/13/20-06/23/20 wound infectionLoculated septate left pleural effusion with placement of 12 French left pigtail chest tube (drain) on 06/15/20 05/29/20-05/30/20 s/p esophagogastroscopy, esophageal stent removal and esophagram with omnipaque by Dr Kipp Brood on 05/29/20 history of minimally invasive Ivor Lewis esophagectomy, anastomotic leak, Status post esophageal stent placement  10/21/21admission for robotic assisted Ivor-Lewis esophagectomy with jejunostomy tube placement by Dr Kipp Brood -discharged from [place] 04/21/20 home with advanced home health infusions, remote health services - J tube in place 11/23/19 EGD with upper endoscopy Dr [name] 7/16/21AnotherFeraheme dosegiven Pre referralAdmission5/14/21 to 09/19/19 syncopeHgb6.7 given blood+Ferahemeon 09/18/19 dxsystemic iron deficiency anemia observed and Hgb increased to 7.7 followed by [facility] gastroenterology  Outreach attempt to the home number  No answer. [facility] RN CM left HIPAA [facility] Portability and Accountability Act) compliant voicemail message along with CM's contact info.   Plan: [facility] RN CM scheduled this patient for another call attempt within 4-7 business days  Virgal Warmuth L. Lavina Hamman, RN, BSN, [place] Coordinator Office number [phone] Mobile number [phone]  Main [facility] number [phone] Fax number [phone]

## 2020-07-21 ENCOUNTER — Ambulatory Visit
Admission: RE | Admit: 2020-07-21 | Discharge: 2020-07-21 | Disposition: A | Discharge status: Home / Self Care | Payer: 59 | Source: Ambulatory Visit | Attending: Thoracic Surgery (Cardiothoracic Vascular Surgery) | Admitting: Thoracic Surgery (Cardiothoracic Vascular Surgery)

## 2020-07-21 ENCOUNTER — Encounter: Payer: Self-pay | Admitting: Thoracic Surgery (Cardiothoracic Vascular Surgery)

## 2020-07-21 ENCOUNTER — Ambulatory Visit (INDEPENDENT_AMBULATORY_CARE_PROVIDER_SITE_OTHER): Payer: Self-pay | Admitting: Thoracic Surgery (Cardiothoracic Vascular Surgery)

## 2020-07-21 ENCOUNTER — Other Ambulatory Visit: Payer: Self-pay

## 2020-07-21 VITALS — BP 117/75 | HR 88 | Resp 20 | Ht 71.0 in | Wt 179.8 lb

## 2020-07-21 DIAGNOSIS — C155 Malignant neoplasm of lower third of esophagus: Secondary | ICD-10-CM

## 2020-07-21 DIAGNOSIS — C160 Malignant neoplasm of cardia: Secondary | ICD-10-CM

## 2020-07-21 DIAGNOSIS — Z09 Encounter for follow-up examination after completed treatment for conditions other than malignant neoplasm: Secondary | ICD-10-CM

## 2020-07-21 DIAGNOSIS — K9189 Other postprocedural complications and disorders of digestive system: Secondary | ICD-10-CM

## 2020-07-21 NOTE — Progress Notes (Signed)
      [address]       [place] [address]             [phone]        Keahi G Schue Burns Medical Record #[id number] Date of Birth: October 06, 1955  Referring: [name], MD Primary Care: [name], MD Primary Cardiologist:No primary care provider on file.  Reason for visit:   follow-up  History of Present Illness:     Mr. Trawick comes back in for his 1 month appointment.  He has had minimal drainage from his chest tube.  Remains on tube feeds.  Unfortunately he is not taking much p.o. intake.  This has improved slightly from his last appointment and he has started to gain some weight back.  Physical Exam: BP 117/75   Pulse 88   Resp 20   Ht 5\' 11"  (1.803 m)   Wt 179 lb 12.8 oz (81.6 kg)   SpO2 93% Comment: RA  BMI 25.08 kg/m   Alert NAD Incision clean.  Chest tube site was clear. Abdomen soft, ND.  J-tube site is clean.    Diagnostic Studies & Laboratory data: CXR: Small left effusion    Assessment / Plan:   65 year old male status post robotic assisted esophagectomy for adenocarcinoma.  His course was complicated by an anastomotic leak as well as a chylothorax.  He was readmitted several weeks after starting a diet and stent removal with an empyema that was treated with a chest tube.  He has 1 more week of antibiotics left but given the minimal output of his drain I elected to remove it.  I will see him back in 1 month.  In regards to his nutritional status he will continue his tube feeds for now.  Ideally he will be able to tolerate more p.o. so that we can get it out.   Lajuana Matte 07/21/2020 4:15 PM

## 2020-07-31 ENCOUNTER — Other Ambulatory Visit: Payer: Self-pay | Admitting: *Deleted

## 2020-07-31 NOTE — Patient Outreach (Addendum)
[facility]) Care Management  07/31/2020  Brett Dawson 01-18-1956 [id number]  [facility] 2nd Unsuccessful outreach  [name] was referred to [facility] on 11/24/19 by [facility] Referral Reason:esophageal tumor with invasion into cradia  Admissions x3in last 6 months 06/13/20-06/23/20 wound infectionLoculated septate left pleural effusion with placement of 12 French left pigtail chest tube (drain) on 06/15/20 05/29/20-05/30/20 s/p esophagogastroscopy, esophageal stent removal and esophagram with omnipaque by Dr Kipp Brood on 05/29/20 history of minimally invasive Ivor Lewis esophagectomy, anastomotic leak, Status post esophageal stent placement  10/21/21admission for robotic assisted Ivor-Lewis esophagectomy with jejunostomy tube placement by Dr Kipp Brood -discharged from [place] 04/21/20 home with advanced home health infusions, remote health services - J tube in place 11/23/19 EGD with upper endoscopy Dr [name] 7/16/21AnotherFeraheme dosegiven Pre referralAdmission5/14/21 to 09/19/19 syncopeHgb6.7 given blood+Ferahemeon 09/18/19 dxsystemic iron deficiency anemia observed and Hgb increased to 7.7 followed by [facility] gastroenterology   Outreach attempt to the home number  No answer. [facility] RN CM left HIPAA [facility] Portability and Accountability Act) compliant voicemail message along with CM's contact info.   Plan: [facility] RN CM scheduled this patient for another call attempt within 4-7 business days  Sharniece Gibbon L. Lavina Hamman, RN, BSN, [place] Coordinator Office number [phone] Mobile number [phone]  Main [facility] number [phone] Fax number [phone]

## 2020-08-10 ENCOUNTER — Encounter: Payer: Self-pay | Admitting: Nutrition

## 2020-08-10 ENCOUNTER — Inpatient Hospital Stay: Payer: 59

## 2020-08-10 ENCOUNTER — Telehealth: Payer: Self-pay | Admitting: Hematology

## 2020-08-10 ENCOUNTER — Inpatient Hospital Stay: Payer: 59 | Admitting: Hematology

## 2020-08-10 ENCOUNTER — Inpatient Hospital Stay: Payer: 59 | Attending: Hematology | Admitting: Nutrition

## 2020-08-10 DIAGNOSIS — R634 Abnormal weight loss: Secondary | ICD-10-CM | POA: Insufficient documentation

## 2020-08-10 DIAGNOSIS — D509 Iron deficiency anemia, unspecified: Secondary | ICD-10-CM | POA: Insufficient documentation

## 2020-08-10 DIAGNOSIS — C155 Malignant neoplasm of lower third of esophagus: Secondary | ICD-10-CM | POA: Insufficient documentation

## 2020-08-10 NOTE — Telephone Encounter (Signed)
Scheduled per 4/7 sch msg . Called and spoke with pt, confirmed 4/20 appts

## 2020-08-10 NOTE — Progress Notes (Incomplete)
[place]   Telephone:([phone] Fax:([phone]) [phone]   Clinic Follow up Note   Patient Care Team: [name], MD as PCP - General (Family Medicine) [name], MD as Consulting Physician (Oncology) Jonnie Finner, RN as Registered Nurse Otis Brace, MD as Consulting Physician (Gastroenterology) Barbaraann Faster, RN as [place] Management Karie Mainland, RD as Dietitian (Nutrition) Lajuana Matte, MD as Consulting Physician (Cardiothoracic Surgery) Arta Silence, MD as Consulting Physician (Gastroenterology)  Date of Service:  08/10/2020  CHIEF COMPLAINT: f/u of Esophagealcancer  SUMMARY OF ONCOLOGIC HISTORY: Oncology History Overview Note  Cancer Staging Malignant neoplasm of lower third of esophagus [facility]) Staging form: Esophagus - Squamous Cell Carcinoma, AJCC 8th Edition - Clinical stage from 11/23/2019: Stage Unknown (cTX, cN1, cM0) - Signed by [name], MD on 11/26/2019     Malignant neoplasm of lower third of esophagus ([place])  09/14/2019 Imaging   CT AP W Contrast 09/14/19 IMPRESSION: 1. Large hiatal hernia. 2. Hepatic and bilateral simple renal cysts. 3. Colonic diverticulosis. 4. Small fat containing left inguinal hernia.   11/10/2019 Imaging   CT Chest 11/10/19  IMPRESSION: 1. There is a large mass of the lower third of the esophagus, with ill-defined margins, measuring approximately 6.0 x 5.4 x 7.7 cm. This appears enlarged and more appreciably masslike than appearance on prior CT dated 09/14/2019. 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 2. There are prominent gastrohepatic ligament lymph nodes adjacent to the lesser curvature, concerning for nodal metastatic disease although not ideally imaged on this examination of the chest. 3. No evidence of metastatic disease within in the chest. 4. Coronary artery disease.   11/22/2019 PET scan   PET 11/22/19 IMPRESSION: 1. Intensely  hypermetabolic (max SUV 45.4) lower thoracic esophageal 6.0 x 5.5 x 9.4 cm mass extending to the esophagogastric junction with probable involvement of the gastric cardia, compatible with primary esophageal malignancy. 2. Hypermetabolic gastrohepatic ligament nodal metastases. 3. No hypermetabolic liver or other distant metastases. 4. Chronic findings include: Aortic Atherosclerosis (ICD10-I70.0). Coronary atherosclerosis. Marked diffuse colonic diverticulosis.   11/23/2019 Procedure   EGD with Upper Endoscopy by Dr [name] 11/23/19 IMPRESSION - Partially obstructing, likely malignant esophageal tumor was found in the lower third of the esophagus. Biopsied. 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 Biopsied. - Normal duodenal bulb, first portion of the duodenum and second portion of the duodenum.   11/23/2019 Initial Biopsy   FINAL MICROSCOPIC DIAGNOSIS:   A. STOMACH, CARDIA, BIOPSY:  - Invasive well-differentiated squamous cell carcinoma.  See comment   B. ESOPHAGUS, DISTAL, BIOPSY:  - Invasive well-differentiated squamous cell carcinoma.  See comment     COMMENT:   A  B.   Dr. Tresa Moore reviewed the case and concurs with the diagnosis.  Dr. [name] was paged on 11/24/2019.    11/23/2019 Cancer Staging   Staging form: Esophagus - Squamous Cell Carcinoma, AJCC 8th Edition - Clinical stage from 11/23/2019: Stage Unknown (cTX, cN1, cM0) - Signed by [name], MD on 11/26/2019   11/24/2019 Initial Diagnosis   Malignant neoplasm of lower third of esophagus ([place])   12/06/2019 - 01/03/2020 Chemotherapy   Concurrent chemoradiation with weekly carboplatin and Taxol starting 12/06/19 - 01/13/20, last chemo 01/03/2020, cycle 6 held due to neutropenia   12/07/2019 - 01/13/2020 Radiation Therapy   Concurrent chemoradiation with Dr Lisbeth Renshaw starting 12/07/19   02/17/2020 Imaging   PET  IMPRESSION: 1. Marked reduction in activity in the distal esophagus and extending into  the stomach, maximum SUV 3.8,  formerly 20.2. There does continue to be circumferential wall thickening in the distal esophagus. 2. Right gastric lymph node is stable in size at 1.1 cm in short axis, but has decreased in activity from previous SUV of 4.8 to current maximum SUV of 2.2. 3. Other imaging findings of potential clinical significance: Aortic Atherosclerosis (ICD10-I70.0). Coronary atherosclerosis. Hepatic and renal cysts. Colonic diverticulosis. Stranding in the central mesentery with some scattered mesenteric lymph nodes favoring sclerosing mesenteritis.   02/24/2020 Pathology Results   XI ROBOTIC ASSISTED IVOR LEWIS ESOPHAGECTOMY   FINAL MICROSCOPIC DIAGNOSIS:   A. LYMPH NODE, CELIAC, EXCISION:  - Focal atypia.  - See comment.   B. LYMPH NODE, LEVEL 4, EXCISION:  - One benign lymph node (0/1).   C. ESOP HAGUS, ESOPHAGECTOMY:  - Ulcer with inflammation, fibrosis and dystrophic calcifications.  - Focal residual keratin with giant cell reaction.  - No residual viable carcinoma identified.  - Margins free of tumor.  - Thirteen benign lymph nodes (0/13).  - See oncology table.   D. ADDITIONAL DISTAL MARGIN, EXCISION:  - Benign stomach.  - No evidence of malignancy.   COMMENT:  A. The celiac lymph node specimen consists of blood and connective  tissue with no lymph node tissue identified.  There are several minute  fragments of epithelioid cells with mild atypia which are not sufficient  for diagnosis of malignancy.   02/24/2020 Cancer Staging   Staging form: Esophagus - Squamous Cell Carcinoma, AJCC 8th Edition - Pathologic stage from 02/24/2020: pT0, pN0, cM0 - Signed by [name], MD on 05/10/2020   03/07/2020 Imaging   CT CAP  IMPRESSION: 1. Previous esophagectomy and pull-through surgery. 2. Large loculated pleural fluid collection posteriorly on the left with air bubbles and air-fluid level consistent with empyema. The left-sided chest tube is not visibly in communication with  the loculated collection. 3. Small amount of pleural fluid on the right with dependent pulmonary atelectasis. 4. 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        CURRENT THERAPY:  Surveillance  INTERVAL HISTORY: *** Brett Dawson is here for a follow up of esophageal cancer. He was last seen by me on 05/11/20. He presents to the clinic {alone/accompanied by}.  REVIEW OF SYSTEMS:  *** Constitutional: Denies fevers, chills or abnormal weight loss Eyes: Denies blurriness of vision Ears, nose, mouth, throat, and face: Denies mucositis or sore throat Respiratory: Denies cough, dyspnea or wheezes Cardiovascular: Denies palpitation, chest discomfort or lower extremity swelling Gastrointestinal:  Denies nausea, heartburn or change in bowel habits Skin: Denies abnormal skin rashes Lymphatics: Denies new lymphadenopathy or easy bruising Neurological:Denies numbness, tingling or new weaknesses Behavioral/Psych: Mood is stable, no new changes  All other systems were reviewed with the patient and are negative.  MEDICAL HISTORY:  Past Medical History:  Diagnosis Date  . Anemia   . Anemia   . Anginal pain ([place])    as a child  . Dyspnea    due to weakness  . Gout   . History of blood transfusion   . Malignant neoplasm of lower third of esophagus ([place]) 11/24/2019    SURGICAL HISTORY: Past Surgical History:  Procedure Laterality Date  . BIOPSY  11/23/2019   Procedure: BIOPSY;  Surgeon: Arta Silence, MD;  Location: [facility];  Service: Endoscopy;;  . ESOPHAGEAL STENT PLACEMENT N/A 04/11/2020   Procedure: ESOPHAGOGASTROSCOPY WITH ESOPHAGEAL STENT PLACEMENT;  Surgeon: Lajuana Matte, MD;  Location:  [place] OR;  Service: Thoracic;  Laterality: N/A;  . ESOPHAGEAL STENT PLACEMENT N/A 05/29/2020   Procedure: ESOPHAGEAL STENT REMOVAL;  Surgeon: Lajuana Matte, MD;  Location: [place];  Service: Thoracic;  Laterality: N/A;  .  ESOPHAGOGASTRODUODENOSCOPY N/A 02/24/2020   Procedure: ESOPHAGOGASTRODUODENOSCOPY (EGD);  Surgeon: Lajuana Matte, MD;  Location: [facility] OR;  Service: Thoracic;  Laterality: N/A;  . ESOPHAGOGASTRODUODENOSCOPY N/A 05/29/2020   Procedure: ESOPHAGOGASTRODUODENOSCOPY (EGD);  Surgeon: Lajuana Matte, MD;  Location: [facility] OR;  Service: Thoracic;  Laterality: N/A;  . ESOPHAGOGASTRODUODENOSCOPY (EGD) WITH PROPOFOL N/A 11/23/2019   Procedure: ESOPHAGOGASTRODUODENOSCOPY (EGD) WITH PROPOFOL;  Surgeon: Arta Silence, MD;  Location: [facility];  Service: Endoscopy;  Laterality: N/A;  . INTERCOSTAL NERVE BLOCK Right 02/24/2020   Procedure: INTERCOSTAL NERVE BLOCK;  Surgeon: Lajuana Matte, MD;  Location: [place];  Service: Thoracic;  Laterality: Right;  . IR EMBO ART  VEN HEMORR LYMPH EXTRAV  [place]  04/07/2020  . IR FLUORO GUIDED NEEDLE PLC ASPIRATION/INJECTION LOC  03/23/2020  . IR LYMPHANGIOGRAM PEL/ABD BILAT  03/23/2020  . IR LYMPHANGIOGRAM PEL/ABD BILAT  04/07/2020  . IR US GUIDANCE  03/23/2020  . IR US GUIDANCE  03/23/2020  . IR US GUIDE VASC ACCESS LEFT  04/07/2020  . IR US GUIDE VASC ACCESS RIGHT  04/07/2020  . RADIOLOGY WITH ANESTHESIA N/A 03/23/2020   Procedure: Malignant neoplasm of esophagus;  Surgeon: Sandi Mariscal, MD;  Location: [place];  Service: Radiology;  Laterality: N/A;  . RADIOLOGY WITH ANESTHESIA N/A 04/07/2020   Procedure: IR WITH ANESTHESIA LYMPHANGIOGRAM BILATERAL WITH EMBOLIZATION;  Surgeon: Criselda Peaches, MD;  Location: [place];  Service: Radiology;  Laterality: N/A;  . TONSILLECTOMY    . VIDEO ASSISTED THORACOSCOPY (VATS)/EMPYEMA Left 04/11/2020   Procedure: LEFT VIDEO ASSISTED THORACOSCOPY (VATS) WITH IRRIGATION AND CHEST TUBE PLACEMENT;  Surgeon: Lajuana Matte, MD;  Location: [place];  Service: Thoracic;  Laterality: Left;    I have reviewed the social history and family history with the patient and they are unchanged from previous  note.  ALLERGIES:  is allergic to bee venom and strawberry extract.  MEDICATIONS:  Current Outpatient Medications  Medication Sig Dispense Refill  . acetaminophen (TYLENOL) 160 MG/5ML solution Place 20.3 mLs (650 mg total) into feeding tube every 4 (four) hours as needed for fever (fever >100.5). 120 mL 0  . famotidine (PEPCID) 40 MG/5ML suspension Take by mouth.    . ferrous sulfate 325 (65 FE) MG tablet 1 tablet    . fluticasone (FLONASE) 50 MCG/ACT nasal spray PLACE 2 SPRAYS INTO BOTH NOSTRILS DAILY. 16 g 2  . guaiFENesin (ROBITUSSIN) 100 MG/5ML liquid PLACE 15 MLS INTO FEEDING TUBE EVERY FOUR HOURS AS NEEDED FOR COUGH OR TO LOOSEN PHLEGM. 236 mL 3  . lactose free nutrition (BOOST PLUS) LIQD Take 237 mLs by mouth 3 (three) times daily with meals.  0  . liver oil-zinc oxide (DESITIN) 40 % ointment Apply topically daily. 56.7 g 0  . Nutritional Supplements (FEEDING SUPPLEMENT, OSMOLITE 1.5 CAL,) LIQD Place 1,680 mLs into feeding tube daily. For 14 hours at night as directed 50400 mL 1  . omeprazole (PRILOSEC) 40 MG capsule TAKE 40 MG (2 CAPSULES) TWICE DAILY PER TUBE 60 capsule 3  . Water For Irrigation, Sterile (FREE WATER) SOLN Place 75 mLs into feeding tube every 4 (four) hours. 1000 mL 12   No current facility-administered medications for this visit.    PHYSICAL EXAMINATION: ECOG PERFORMANCE STATUS: {CHL ONC ECOG IP:[id number]}  There were no vitals filed for this visit. There were no vitals filed for this visit. *** GENERAL:alert, no distress and comfortable SKIN: skin color, texture, turgor are normal, no rashes or significant lesions EYES: normal, Conjunctiva are pink and non-injected, sclera clear {OROPHARYNX:no exudate, no erythema and lips, buccal mucosa, and tongue normal}  NECK: supple, thyroid normal size, non-tender, without nodularity LYMPH:  no palpable lymphadenopathy in the cervical, axillary {or inguinal} LUNGS: clear to auscultation and percussion with normal  breathing effort HEART: regular rate & rhythm and no murmurs and no lower extremity edema ABDOMEN:abdomen soft, non-tender and normal bowel sounds Musculoskeletal:no cyanosis of digits and no clubbing  NEURO: alert & oriented x 3 with fluent speech, no focal motor/sensory deficits  LABORATORY DATA:  I have reviewed the data as listed CBC Latest Ref Rng & Units 06/20/2020 06/14/2020 06/13/2020  WBC 4.0 - 10.5 K/uL 8.1 9.3 13.1(H)  Hemoglobin 13.0 - 17.0 g/dL 10.2(L) 9.8(L) 11.0(L)  Hematocrit 39.0 - 52.0 % 32.7(L) 29.9(L) 32.6(L)  Platelets 150 - 400 K/uL 449(H) 403(H) 468(H)     CMP Latest Ref Rng & Units 06/20/2020 06/18/2020 06/17/2020  Glucose 70 - 99 mg/dL 127(H) 124(H) 148(H)  BUN 8 - 23 mg/dL 11 7(L) 8  Creatinine 0.61 - 1.24 mg/dL 0.42(L) 0.47(L) 0.38(L)  Sodium 135 - 145 mmol/L 134(L) 137 136  Potassium 3.5 - 5.1 mmol/L 4.6 4.9 4.8  Chloride 98 - 111 mmol/L 100 102 105  CO2 22 - 32 mmol/L 27 28 24   Calcium 8.9 - 10.3 mg/dL 8.6(L) 8.6(L) 8.1(L)  Total Protein 6.5 - 8.1 g/dL - - -  Total Bilirubin 0.3 - 1.2 mg/dL - - -  Alkaline Phos 38 - 126 U/L - - -  AST 15 - 41 U/L - - -  ALT 0 - 44 U/L - - -      RADIOGRAPHIC STUDIES: I have personally reviewed the radiological images as listed and agreed with the findings in the report. No results found.   ASSESSMENT & PLAN: *** Brett Dawson is a 65 y.o. male with   1.Esophagealsquamous cell carcinoma, in distalesophagus,cTxN1M0, ypT0N0 -He presented with symptoms in May 2021, but was not diagnosed until 11/23/19 EGD where path showed Invasive well-differentiated squamous cell carcinomaof esophaguswhich extend tostomach cardia. -He was treated with concurrent chemoradiation with weekly carboplatin and Taxol8/2/21-01/13/20, last chemo 01/03/20, cycle 6 held due to neutropenia. -He underwent esophagectomy on 02/24/20. Path showed no residual carcinoma, 13 LN negative.  He had complete response to neoadjuvant therapy.  This is a  excellent prognostic factors, predicts low risk of recurrence, although the risk is not zero. -Does not need any adjuvant therapy, such as immunotherapy, due to the complete response. -EGD and stent removal performed on 05/29/20. -He developed chest tube site drainage, significant maceration around J-tube site, and clinical deterioration. He was admitted on 06/13/20. ***   -We discussed esophageal cancer surveillance.  We will see him every 3 to 4 months in the first 2 to 3 years, then every 6 to 12 months for a total of 5 years.  Plan to repeat CT scan every 6 months for the first few years, then yearly for 1-2 years -He is recovering slowly from surgery, he will follow-up with Dr. Kipp Brood in 2 weeks   2. Iron deficiency anemia secondary to blood loss -Secondary to #1 -The patient received blood transfusion on 09/27/19 and 1 dose Feraheme on 09/18/19 while in the hospital for symptomatic anemia.Due to persistent low iron and  anemia, he received 2 more IV iron doses in 11/2019. -anemia much improved    3. Weight loss and dysphagia, esophageal stent  -dysphagia resolved after chemoRT -He had a prolonged hospitalization due to complications from an anastomotic leak as well as a thoracic duct leak. He underwent esophageal stent placement on 04/11/2020 and has had marked improvement of his chest tube output. He is back on a clear liquid diet tolerating tube feeds nocturnally.  -f/u with dietician ***  PLAN: ***    -Surgical pathology reviewed, complete response, no need additional adjuvant therapy  -f/u with dietician for tube feeds, I will send a message  -will address his J-tube site today  -f/u with Dr. Kipp Brood  -lab and f/u in 3 months    No problem-specific Assessment & Plan notes found for this encounter.   No orders of the defined types were placed in this encounter.  All questions were answered. The patient knows to call the clinic with any problems, questions or  concerns. No barriers to learning was detected. The total time spent in the appointment was {CHL ONC TIME VISIT - DIXBO:[id number]}.     Aurea Graff 08/10/2020   I, Wilburn Mylar, am acting as scribe for [name], MD.   {Add scribe attestation statement}

## 2020-08-10 NOTE — Progress Notes (Signed)
Patient did not show up for appointment.

## 2020-08-11 ENCOUNTER — Other Ambulatory Visit: Payer: Self-pay | Admitting: *Deleted

## 2020-08-11 NOTE — Patient Outreach (Signed)
[facility]) Care Management  08/11/2020  KWASI JOUNG 1955/10/28 [id number]   [facility] 3rd Unsuccessful outreach  Mr Brett Dawson was referred to [facility] on 11/24/19 by [facility] Referral Reason:esophageal tumor with invasion into cradia  Admissions x3in last 6 months 06/13/20-06/23/20 wound infectionLoculated septate left pleural effusion with placement of 12 French left pigtail chest tube (drain) on 06/15/20 05/29/20-05/30/20 s/p esophagogastroscopy, esophageal stent removal and esophagram with omnipaque by Dr Kipp Brood on 05/29/20 history of minimally invasive Ivor Lewis esophagectomy, anastomotic leak, Status post esophageal stent placement  10/21/21admission for robotic assisted Ivor-Lewis esophagectomy with jejunostomy tube placement by Dr Kipp Brood -discharged from [place] 04/21/20 home with advanced home health infusions, remote health services - J tube in place 11/23/19 EGD with upper endoscopy Dr [name] 7/16/21AnotherFeraheme dosegiven Pre referralAdmission5/14/21 to 09/19/19 syncopeHgb6.7 given blood+Ferahemeon 09/18/19 dxsystemic iron deficiency anemia observed and Hgb increased to 7.7 followed by [facility] gastroenterology   Outreach attempt to the home number  No answer. [facility] RN CM left HIPAA [facility] Portability and Accountability Act) compliant voicemail message along with CM's contact info.  Last incoming/successful outreach from this patient was on 07/07/20 Review of EPIC indicates pt is interacting with oncology staff telephonically and is not admitted   [facility] care guide outreach to pt x 3 without success (06/07/20, 06/09/20, 06/22/20) and case was closed on 06/22/20 bu J [place]  Plan: [facility] RN CM placed this [facility] engaged pt on hold per [facility] workflow for pending case closure if no return call from patient  Unsuccessful outreaches on 07/20/20, 07/31/20, 08/11/20 Unsuccessful letter sent  Joelene Millin L. Lavina Hamman, RN, BSN, [place] Coordinator Office number [phone] Main [facility] number [phone] Fax number [phone]

## 2020-08-14 ENCOUNTER — Other Ambulatory Visit: Payer: Self-pay | Admitting: *Deleted

## 2020-08-14 NOTE — Patient Outreach (Signed)
[facility]) Care Management  08/14/2020  Brett Dawson 1955/08/13 [id number]   [name] coordination- message from pt   Incoming message from patient stating he was returning a call to [name] after 08/11/20 message left by [facility] RN CM  He states he did get food stamps only     [name] L. Lavina Hamman, RN, BSN, [place] Coordinator Office number [phone] Mobile number [phone]  Main [facility] number [phone] Fax number [phone]

## 2020-08-16 ENCOUNTER — Other Ambulatory Visit: Payer: Self-pay | Admitting: Thoracic Surgery (Cardiothoracic Vascular Surgery)

## 2020-08-16 DIAGNOSIS — C155 Malignant neoplasm of lower third of esophagus: Secondary | ICD-10-CM

## 2020-08-17 ENCOUNTER — Encounter: Payer: Self-pay | Admitting: Thoracic Surgery (Cardiothoracic Vascular Surgery)

## 2020-08-17 ENCOUNTER — Ambulatory Visit (INDEPENDENT_AMBULATORY_CARE_PROVIDER_SITE_OTHER): Payer: Self-pay | Admitting: Thoracic Surgery (Cardiothoracic Vascular Surgery)

## 2020-08-17 ENCOUNTER — Ambulatory Visit
Admission: RE | Admit: 2020-08-17 | Discharge: 2020-08-17 | Disposition: A | Discharge status: Home / Self Care | Payer: 59 | Source: Ambulatory Visit | Attending: Thoracic Surgery (Cardiothoracic Vascular Surgery) | Admitting: Thoracic Surgery (Cardiothoracic Vascular Surgery)

## 2020-08-17 ENCOUNTER — Other Ambulatory Visit: Payer: Self-pay

## 2020-08-17 VITALS — BP 115/67 | HR 73 | Resp 20 | Ht 71.0 in | Wt 187.0 lb

## 2020-08-17 DIAGNOSIS — C155 Malignant neoplasm of lower third of esophagus: Secondary | ICD-10-CM

## 2020-08-17 DIAGNOSIS — K9189 Other postprocedural complications and disorders of digestive system: Secondary | ICD-10-CM

## 2020-08-17 NOTE — Progress Notes (Signed)
     [address]       [place] [address]             [phone]       Mr. Eberlin comes in for follow-up appointment.  He is tolerating regular diet without any difficulty.  He has also gained some weight.  He denies any fevers or chills.  Vitals:   08/17/20 1403  BP: 115/67  Pulse: 73  Resp: 20  SpO2: 95%   Alert, NAD Regular rate  Easy work of breathing Abdomen soft with J-tube in place.  The J-tube was removed and was covered with gauze.  65 year old male status post robotic assisted Ivor Lewis esophagectomy complicated by a chyle leak as well as anastomotic leak.  He has been on prolonged jejunal tube feeds, but has been able to tolerate much more food.  I removed the J-tube today.  I will see him back as a virtual visit in 2 weeks to assess closure of the J-tube site.  Tunisha Ruland [name]

## 2020-08-18 ENCOUNTER — Encounter: Payer: 59 | Admitting: Thoracic Surgery (Cardiothoracic Vascular Surgery)

## 2020-08-18 NOTE — Progress Notes (Signed)
[place]   [phone] [phone]   Clinic Follow up Note   Patient Care Team: [name], MD as PCP - General (Family Medicine) [name], MD as Consulting Physician (Oncology) Jonnie Finner, RN as Registered Nurse Otis Brace, MD as Consulting Physician (Gastroenterology) Barbaraann Faster, RN as [place] Management Karie Mainland, RD as Dietitian (Nutrition) Lajuana Matte, MD as Consulting Physician (Cardiothoracic Surgery) Arta Silence, MD as Consulting Physician (Gastroenterology)  Date of Service:  08/23/2020  CHIEF COMPLAINT: F/u ofEsophagealcancer  SUMMARY OF ONCOLOGIC HISTORY: Oncology History Overview Note  Cancer Staging Malignant neoplasm of lower third of esophagus [facility]) Staging form: Esophagus - Squamous Cell Carcinoma, AJCC 8th Edition - Clinical stage from 11/23/2019: Stage Unknown (cTX, cN1, cM0) - Signed by [name], MD on 11/26/2019     Malignant neoplasm of lower third of esophagus ([place])  09/14/2019 Imaging   CT AP W Contrast 09/14/19 IMPRESSION: 1. Large hiatal hernia. 2. Hepatic and bilateral simple renal cysts. 3. Colonic diverticulosis. 4. Small fat containing left inguinal hernia.   11/10/2019 Imaging   CT Chest 11/10/19  IMPRESSION: 1. There is a large mass of the lower third of the esophagus, with ill-defined margins, measuring approximately 6.0 x 5.4 x 7.7 cm. This appears enlarged and more appreciably masslike than appearance on prior CT dated 09/14/2019. 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 2. There are prominent gastrohepatic ligament lymph nodes adjacent to the lesser curvature, concerning for nodal metastatic disease although not ideally imaged on this examination of the chest. 3. No evidence of metastatic disease within in the chest. 4. Coronary artery disease.   11/22/2019 PET scan   PET 11/22/19 IMPRESSION: 1. Intensely  hypermetabolic (max SUV 67.6) lower thoracic esophageal 6.0 x 5.5 x 9.4 cm mass extending to the esophagogastric junction with probable involvement of the gastric cardia, compatible with primary esophageal malignancy. 2. Hypermetabolic gastrohepatic ligament nodal metastases. 3. No hypermetabolic liver or other distant metastases. 4. Chronic findings include: Aortic Atherosclerosis (ICD10-I70.0). Coronary atherosclerosis. Marked diffuse colonic diverticulosis.   11/23/2019 Procedure   EGD with Upper Endoscopy by Dr [name] 11/23/19 IMPRESSION - Partially obstructing, likely malignant esophageal tumor was found in the lower third of the esophagus. Biopsied. 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 Biopsied. - Normal duodenal bulb, first portion of the duodenum and second portion of the duodenum.   11/23/2019 Initial Biopsy   FINAL MICROSCOPIC DIAGNOSIS:   A. STOMACH, CARDIA, BIOPSY:  - Invasive well-differentiated squamous cell carcinoma.  See comment   B. ESOPHAGUS, DISTAL, BIOPSY:  - Invasive well-differentiated squamous cell carcinoma.  See comment     COMMENT:   A  B.   Dr. Tresa Moore reviewed the case and concurs with the diagnosis.  Dr. [name] was paged on 11/24/2019.    11/23/2019 Cancer Staging   Staging form: Esophagus - Squamous Cell Carcinoma, AJCC 8th Edition - Clinical stage from 11/23/2019: Stage Unknown (cTX, cN1, cM0) - Signed by [name], MD on 11/26/2019   11/24/2019 Initial Diagnosis   Malignant neoplasm of lower third of esophagus ([place])   12/06/2019 - 01/03/2020 Chemotherapy   Concurrent chemoradiation with weekly carboplatin and Taxol starting 12/06/19 - 01/13/20, last chemo 01/03/2020, cycle 6 held due to neutropenia   12/07/2019 - 01/13/2020 Radiation Therapy   Concurrent chemoradiation with Dr Lisbeth Renshaw starting 12/07/19   02/17/2020 Imaging   PET  IMPRESSION: 1. Marked reduction in activity in the distal esophagus and extending into the  stomach, maximum SUV 3.8,  formerly 20.2. There does continue to be circumferential wall thickening in the distal esophagus. 2. Right gastric lymph node is stable in size at 1.1 cm in short axis, but has decreased in activity from previous SUV of 4.8 to current maximum SUV of 2.2. 3. Other imaging findings of potential clinical significance: Aortic Atherosclerosis (ICD10-I70.0). Coronary atherosclerosis. Hepatic and renal cysts. Colonic diverticulosis. Stranding in the central mesentery with some scattered mesenteric lymph nodes favoring sclerosing mesenteritis.   02/24/2020 Pathology Results   XI ROBOTIC ASSISTED IVOR LEWIS ESOPHAGECTOMY   FINAL MICROSCOPIC DIAGNOSIS:   A. LYMPH NODE, CELIAC, EXCISION:  - Focal atypia.  - See comment.   B. LYMPH NODE, LEVEL 4, EXCISION:  - One benign lymph node (0/1).   C. ESOP HAGUS, ESOPHAGECTOMY:  - Ulcer with inflammation, fibrosis and dystrophic calcifications.  - Focal residual keratin with giant cell reaction.  - No residual viable carcinoma identified.  - Margins free of tumor.  - Thirteen benign lymph nodes (0/13).  - See oncology table.   D. ADDITIONAL DISTAL MARGIN, EXCISION:  - Benign stomach.  - No evidence of malignancy.   COMMENT:  A. The celiac lymph node specimen consists of blood and connective  tissue with no lymph node tissue identified.  There are several minute  fragments of epithelioid cells with mild atypia which are not sufficient  for diagnosis of malignancy.   02/24/2020 Cancer Staging   Staging form: Esophagus - Squamous Cell Carcinoma, AJCC 8th Edition - Pathologic stage from 02/24/2020: pT0, pN0, cM0 - Signed by [name], MD on 05/10/2020   03/07/2020 Imaging   CT CAP  IMPRESSION: 1. Previous esophagectomy and pull-through surgery. 2. Large loculated pleural fluid collection posteriorly on the left with air bubbles and air-fluid level consistent with empyema. The left-sided chest tube is not visibly in communication with  the loculated collection. 3. Small amount of pleural fluid on the right with dependent pulmonary atelectasis. 4. 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        CURRENT THERAPY:  Surveillance  INTERVAL HISTORY:  Brett Dawson is here for a follow up of esophageal cancer. He was last seen by me 3 months ago. He presents to the clinic alone. He notes he is doing well. He had his feeding tube removed 08/17/20 and draining tube removed recently as well. He notes he is eating mostly well off feeding tube. He notes he is still weak. He denies abdominal pain. He notes he has a lot of excess skin given weight loss from surgery and treatment. I reviewed his medication list with him. He is not taking Tylenol, Robitussin ferrous sulfate, PPI. He is taking Ensure boost and ointment.    REVIEW OF SYSTEMS:   Constitutional: Denies fevers, chills or abnormal weight loss Eyes: Denies blurriness of vision Ears, nose, mouth, throat, and face: Denies mucositis or sore throat Respiratory: Denies cough, dyspnea or wheezes Cardiovascular: Denies palpitation, chest discomfort or lower extremity swelling Gastrointestinal:  Denies nausea, heartburn or change in bowel habits Skin: Denies abnormal skin rashes Lymphatics: Denies new lymphadenopathy or easy bruising Neurological:Denies numbness, tingling or new weaknesses Behavioral/Psych: Mood is stable, no new changes  All other systems were reviewed with the patient and are negative.  MEDICAL HISTORY:  Past Medical History:  Diagnosis Date  . Anemia   . Anemia   . Anginal pain ([place])    as a child  . Dyspnea    due  to weakness  . Gout   . History of blood transfusion   . Malignant neoplasm of lower third of esophagus ([place]) 11/24/2019    SURGICAL HISTORY: Past Surgical History:  Procedure Laterality Date  . BIOPSY  11/23/2019   Procedure: BIOPSY;  Surgeon: Arta Silence, MD;  Location: [facility];  Service:  Endoscopy;;  . ESOPHAGEAL STENT PLACEMENT N/A 04/11/2020   Procedure: ESOPHAGOGASTROSCOPY WITH ESOPHAGEAL STENT PLACEMENT;  Surgeon: Lajuana Matte, MD;  Location: [place];  Service: Thoracic;  Laterality: N/A;  . ESOPHAGEAL STENT PLACEMENT N/A 05/29/2020   Procedure: ESOPHAGEAL STENT REMOVAL;  Surgeon: Lajuana Matte, MD;  Location: [place];  Service: Thoracic;  Laterality: N/A;  . ESOPHAGOGASTRODUODENOSCOPY N/A 02/24/2020   Procedure: ESOPHAGOGASTRODUODENOSCOPY (EGD);  Surgeon: Lajuana Matte, MD;  Location: [facility] OR;  Service: Thoracic;  Laterality: N/A;  . ESOPHAGOGASTRODUODENOSCOPY N/A 05/29/2020   Procedure: ESOPHAGOGASTRODUODENOSCOPY (EGD);  Surgeon: Lajuana Matte, MD;  Location[facility] OR;  Service: Thoracic;  Laterality: N/A;  . ESOPHAGOGASTRODUODENOSCOPY (EGD) WITH PROPOFOL N/A 11/23/2019   Procedure: ESOPHAGOGASTRODUODENOSCOPY (EGD) WITH PROPOFOL;  Surgeon: Arta Silence, MD;  Location: [facility];  Service: Endoscopy;  Laterality: N/A;  . INTERCOSTAL NERVE BLOCK Right 02/24/2020   Procedure: INTERCOSTAL NERVE BLOCK;  Surgeon: Lajuana Matte, MD;  Location: [place];  Service: Thoracic;  Laterality: Right;  . IR EMBO ART  VEN HEMORR LYMPH EXTRAV  [place]  04/07/2020  . IR FLUORO GUIDED NEEDLE PLC ASPIRATION/INJECTION LOC  03/23/2020  . IR LYMPHANGIOGRAM PEL/ABD BILAT  03/23/2020  . IR LYMPHANGIOGRAM PEL/ABD BILAT  04/07/2020  . IR US GUIDANCE  03/23/2020  . IR US GUIDANCE  03/23/2020  . IR US GUIDE VASC ACCESS LEFT  04/07/2020  . IR US GUIDE VASC ACCESS RIGHT  04/07/2020  . RADIOLOGY WITH ANESTHESIA N/A 03/23/2020   Procedure: Malignant neoplasm of esophagus;  Surgeon: Sandi Mariscal, MD;  Location: [place];  Service: Radiology;  Laterality: N/A;  . RADIOLOGY WITH ANESTHESIA N/A 04/07/2020   Procedure: IR WITH ANESTHESIA LYMPHANGIOGRAM BILATERAL WITH EMBOLIZATION;  Surgeon: Criselda Peaches, MD;  Location: [place];  Service: Radiology;  Laterality: N/A;  .  TONSILLECTOMY    . VIDEO ASSISTED THORACOSCOPY (VATS)/EMPYEMA Left 04/11/2020   Procedure: LEFT VIDEO ASSISTED THORACOSCOPY (VATS) WITH IRRIGATION AND CHEST TUBE PLACEMENT;  Surgeon: Lajuana Matte, MD;  Location: [place];  Service: Thoracic;  Laterality: Left;    I have reviewed the social history and family history with the patient and they are unchanged from previous note.  ALLERGIES:  is allergic to bee venom and strawberry extract.  MEDICATIONS:  Current Outpatient Medications  Medication Sig Dispense Refill  . fluticasone (FLONASE) 50 MCG/ACT nasal spray PLACE 2 SPRAYS INTO BOTH NOSTRILS DAILY. 16 g 2  . lactose free nutrition (BOOST PLUS) LIQD Take 237 mLs by mouth 3 (three) times daily with meals.  0  . liver oil-zinc oxide (DESITIN) 40 % ointment Apply topically daily. 56.7 g 0   No current facility-administered medications for this visit.    PHYSICAL EXAMINATION: ECOG PERFORMANCE STATUS: 1 - Symptomatic but completely ambulatory  Vitals:   08/23/20 1245  BP: 120/76  Pulse: 71  Resp: 18  Temp: (!) 96.8 F (36 C)  SpO2: 97%   Filed Weights   08/23/20 1245  Weight: 184 lb 3.2 oz (83.6 kg)    GENERAL:alert, no distress and comfortable SKIN: skin color, texture, turgor are normal, no rashes or significant lesions EYES: normal, Conjunctiva are pink and  non-injected, sclera clear  NECK: supple, thyroid normal size, non-tender, without nodularity LYMPH:  no palpable lymphadenopathy in the cervical, axillary  LUNGS: clear to auscultation and percussion (+) Mild decreased breaths sounds of left lung  HEART: regular rate & rhythm and no murmurs and no lower extremity edema ABDOMEN:abdomen soft, non-tender and normal bowel sounds (+) Surgical incisions healed well  Musculoskeletal:no cyanosis of digits and no clubbing  NEURO: alert & oriented x 3 with fluent speech, no focal motor/sensory deficits  LABORATORY DATA:  I have reviewed the data as listed CBC Latest Ref  Rng & Units 08/23/2020 06/20/2020 06/14/2020  WBC 4.0 - 10.5 K/uL 4.0 8.1 9.3  Hemoglobin 13.0 - 17.0 g/dL 11.8(L) 10.2(L) 9.8(L)  Hematocrit 39.0 - 52.0 % 35.3(L) 32.7(L) 29.9(L)  Platelets 150 - 400 K/uL 176 449(H) 403(H)     CMP Latest Ref Rng & Units 08/23/2020 06/20/2020 06/18/2020  Glucose 70 - 99 mg/dL 100(H) 127(H) 124(H)  BUN 8 - 23 mg/dL 9 11 7(L)  Creatinine 0.61 - 1.24 mg/dL 0.73 0.42(L) 0.47(L)  Sodium 135 - 145 mmol/L 143 134(L) 137  Potassium 3.5 - 5.1 mmol/L 3.8 4.6 4.9  Chloride 98 - 111 mmol/L 105 100 102  CO2 22 - 32 mmol/L 27 27 28   Calcium 8.9 - 10.3 mg/dL 9.4 8.6(L) 8.6(L)  Total Protein 6.5 - 8.1 g/dL 6.4(L) - -  Total Bilirubin 0.3 - 1.2 mg/dL 0.6 - -  Alkaline Phos 38 - 126 U/L 77 - -  AST 15 - 41 U/L 17 - -  ALT 0 - 44 U/L 9 - -      RADIOGRAPHIC STUDIES: I have personally reviewed the radiological images as listed and agreed with the findings in the report. No results found.   ASSESSMENT & PLAN:  Brett Dawson is a 65 y.o. male with    1.Esophagealsquamous cell carcinoma, in distalesophagus,cTxN1M0, ypT0N0 -He presented with symptoms in May 2021, but was not diagnosed until 11/23/19 EGD where path showed Invasive well-differentiated squamous cell carcinomaof esophaguswhich extend tostomach cardia. -He was treated with concurrent chemoradiation with weekly carboplatin and Taxol8/2/21-01/13/20, last chemo 01/03/2020, cycle 6 held due to neutropenia. -He underwent esophagectomy on 02/24/20. Path showed no residual carcinoma, 13 LN negative.  He had complete response to neoadjuvant therapy.  This is a excellent prognostic factors, predicts low risk of recurrence, although the risk is not zero. -Given complete response, he does not require adjuvant treatment. He is currently on surveillance.  -He is clinically doing well. He eating is fair, weight mostly stable. His fatigue/weakness still present. I recommend he increase eating. Labs reviewed, CBC and CMP  WNL except mild anemia. Physical exam unremarkable with mild decreased left lung sounds. He will continue to F/u with Dr Kipp Brood.  -Will continue surveillance. He has had multiple CT chest in past 3-4 months. Next CT CAP in 2-3 weeks. I will call him with results.  -F/u in 3 months.    2. Iron deficiency anemia secondary to blood loss -The patient received blood transfusion on 09/27/19 and 1 dose Feraheme on 09/18/19 while in the hospital for symptomatic anemia.Due to persistent low iron and anemia, he received 2 more IV iron doses in 11/2019. -S/p treatment and surgery, anemia much improved, now mild. He is no longer taking oral iron. Today Iron panel still pending (08/23/20)   3. Weight loss -He is s/p feeding tube due to esophageal cancer, dysphagia and complications.   -His current eating is fair. He mostly has maintained  his weight. I recommend he increase eating as to work on gaining weight and avoid losing weight.    PLAN: -CT CAP w contrast in 2-3 weeks, will call him with results  -Lab and f/u in 3 months    No problem-specific Assessment & Plan notes found for this encounter.   Orders Placed This Encounter  Procedures  . CT Abdomen Pelvis W Contrast    Standing Status:   Future    Standing Expiration Date:   08/23/2021    Order Specific Question:   If indicated for the ordered procedure, I authorize the administration of contrast media per Radiology protocol    Answer:   Yes    Order Specific Question:   Preferred imaging location?    Answer:   [facility]    Order Specific Question:   Release to patient    Answer:   Manual release only    Order Specific Question:   Reason for preventing immediate release    Answer:   Patient request [1]    Order Specific Question:   Additional details for preventing immediate release    Answer:   none    Order Specific Question:   Is Oral Contrast requested for this exam?    Answer:   Yes, Per Radiology protocol   All  questions were answered. The patient knows to call the clinic with any problems, questions or concerns. No barriers to learning was detected. The total time spent in the appointment was 30 minutes.     [name], MD 08/23/2020   I, Joslyn Devon, am acting as scribe for [name], MD.   I have reviewed the above documentation for accuracy and completeness, and I agree with the above.

## 2020-08-23 ENCOUNTER — Inpatient Hospital Stay (HOSPITAL_BASED_OUTPATIENT_CLINIC_OR_DEPARTMENT_OTHER): Payer: 59 | Admitting: Hematology

## 2020-08-23 ENCOUNTER — Other Ambulatory Visit: Payer: Self-pay

## 2020-08-23 ENCOUNTER — Inpatient Hospital Stay: Payer: 59

## 2020-08-23 ENCOUNTER — Inpatient Hospital Stay: Payer: 59 | Admitting: Nutrition

## 2020-08-23 ENCOUNTER — Encounter: Payer: Self-pay | Admitting: Hematology

## 2020-08-23 VITALS — BP 120/76 | HR 71 | Temp 96.8°F | Resp 18 | Ht 71.0 in | Wt 184.2 lb

## 2020-08-23 DIAGNOSIS — R634 Abnormal weight loss: Secondary | ICD-10-CM | POA: Diagnosis not present

## 2020-08-23 DIAGNOSIS — D508 Other iron deficiency anemias: Secondary | ICD-10-CM

## 2020-08-23 DIAGNOSIS — D509 Iron deficiency anemia, unspecified: Secondary | ICD-10-CM | POA: Diagnosis not present

## 2020-08-23 DIAGNOSIS — C155 Malignant neoplasm of lower third of esophagus: Secondary | ICD-10-CM | POA: Diagnosis not present

## 2020-08-23 LAB — CBC WITH DIFFERENTIAL (CANCER CENTER ONLY)
Abs Immature Granulocytes: 0.01 10*3/uL (ref 0.00–0.07)
Basophils Absolute: 0 10*3/uL (ref 0.0–0.1)
Basophils Relative: 1 %
Eosinophils Absolute: 0.1 10*3/uL (ref 0.0–0.5)
Eosinophils Relative: 3 %
HCT: 35.3 % — ABNORMAL LOW (ref 39.0–52.0)
Hemoglobin: 11.8 g/dL — ABNORMAL LOW (ref 13.0–17.0)
Immature Granulocytes: 0 %
Lymphocytes Relative: 14 %
Lymphs Abs: 0.6 10*3/uL — ABNORMAL LOW (ref 0.7–4.0)
MCH: 29.7 pg (ref 26.0–34.0)
MCHC: 33.4 g/dL (ref 30.0–36.0)
MCV: 88.9 fL (ref 80.0–100.0)
Monocytes Absolute: 0.5 10*3/uL (ref 0.1–1.0)
Monocytes Relative: 12 %
Neutro Abs: 2.8 10*3/uL (ref 1.7–7.7)
Neutrophils Relative %: 70 %
Platelet Count: 176 10*3/uL (ref 150–400)
RBC: 3.97 MIL/uL — ABNORMAL LOW (ref 4.22–5.81)
RDW: 17.3 % — ABNORMAL HIGH (ref 11.5–15.5)
WBC Count: 4 10*3/uL (ref 4.0–10.5)
nRBC: 0 % (ref 0.0–0.2)

## 2020-08-23 LAB — CMP (CANCER CENTER ONLY)
ALT: 9 U/L (ref 0–44)
AST: 17 U/L (ref 15–41)
Albumin: 3.7 g/dL (ref 3.5–5.0)
Alkaline Phosphatase: 77 U/L (ref 38–126)
Anion gap: 11 (ref 5–15)
BUN: 9 mg/dL (ref 8–23)
CO2: 27 mmol/L (ref 22–32)
Calcium: 9.4 mg/dL (ref 8.9–10.3)
Chloride: 105 mmol/L (ref 98–111)
Creatinine: 0.73 mg/dL (ref 0.61–1.24)
GFR, Estimated: >60 mL/min (ref >60)
Glucose, Bld: 100 mg/dL — ABNORMAL HIGH (ref 70–99)
Potassium: 3.8 mmol/L (ref 3.5–5.1)
Sodium: 143 mmol/L (ref 135–145)
Total Bilirubin: 0.6 mg/dL (ref 0.3–1.2)
Total Protein: 6.4 g/dL — ABNORMAL LOW (ref 6.5–8.1)

## 2020-08-23 LAB — FERRITIN: Ferritin: 25 ng/mL (ref 24–336)

## 2020-08-23 NOTE — Progress Notes (Signed)
Brief nutrition follow-up completed with patient status post chemo and radiation therapy for esophageal cancer.  Surgeon removed his jejunostomy tube yesterday.  He is following a regular diet without difficulty.  Weight increased and documented as 184.2 pounds on April 20 improved from 179.8 pounds March 18.  Patient denies any difficulty eating at this time.  He has no questions or concerns.  Nutrition diagnosis: Inadequate oral intake resolved.  Encourage patient to continue strategies for increased calorie and protein intake.  Encourage supplements as needed for weight maintenance/weight gain.  Patient agrees to call RD with questions or concerns.  No follow-up scheduled.  **Disclaimer: This note was dictated with voice recognition software. Similar sounding words can inadvertently be transcribed and this note may contain transcription errors which may not have been corrected upon publication of note.**

## 2020-08-24 ENCOUNTER — Telehealth: Payer: Self-pay | Admitting: Hematology

## 2020-08-24 NOTE — Telephone Encounter (Signed)
Scheduled follow-up appointment per 4/20 los. Patient is aware. 

## 2020-08-25 ENCOUNTER — Telehealth: Payer: Self-pay

## 2020-08-25 NOTE — Telephone Encounter (Signed)
I spoe with Mr Rindfleisch and reviewed Lacie's comments and recommendations.  He is reluctant to take oral iron secondary to severe constipation he experienced in the past while taking iron.  I recommended colace, he is still reluctant.   We reviewed foods rich with iron.

## 2020-08-25 NOTE — Telephone Encounter (Signed)
-----   Message from Alla Feeling, NP sent at 08/24/2020  2:55 PM EDT ----- Please let him know anemia is improving, ferritin 25. I recommend to restart oral iron 1-2 times daily for 2-3 months, to build his iron store. He can take multivitamin with iron if he does not tolerate it.  Thanks, Regan Rakers, NP

## 2020-09-01 ENCOUNTER — Other Ambulatory Visit: Payer: Self-pay

## 2020-09-01 ENCOUNTER — Telehealth (INDEPENDENT_AMBULATORY_CARE_PROVIDER_SITE_OTHER): Payer: 59 | Admitting: Thoracic Surgery (Cardiothoracic Vascular Surgery)

## 2020-09-01 DIAGNOSIS — C159 Malignant neoplasm of esophagus, unspecified: Secondary | ICD-10-CM | POA: Diagnosis not present

## 2020-09-01 NOTE — Progress Notes (Signed)
     [address]       [place] [address]             [phone]       Patient: Home Provider: Office Consent for Telemedicine visit obtained.  Today's visit was completed via a real-time telehealth (see specific modality noted below). The patient/authorized person provided oral consent at the time of the visit to engage in a telemedicine encounter with the present provider at [facility]. The patient/authorized person was informed of the potential benefits, limitations, and risks of telemedicine. The patient/authorized person expressed understanding that the laws that protect confidentiality also apply to telemedicine. The patient/authorized person acknowledged understanding that telemedicine does not provide emergency services and that he or she would need to call 911 or proceed to the nearest hospital for help if such a need arose.  . Total time spent in the clinical discussion 10 minutes. . Telehealth Modality: Phone visit (audio only)  I had a telephone visit with Mr. Fenlon.  He is doing quite well.  The J-tube site is closing nicely.  He is tolerating regular food.  His weight has been stable.  He continues to meet with the dietitian.  Overall doing well.  His surveillance is currently being done by medical oncology.  He can follow-up with me as needed.

## 2020-09-07 ENCOUNTER — Ambulatory Visit (HOSPITAL_COMMUNITY)
Admission: RE | Admit: 2020-09-07 | Discharge: 2020-09-07 | Disposition: A | Discharge status: Home / Self Care | Payer: 59 | Source: Ambulatory Visit | Attending: Hematology | Admitting: Hematology

## 2020-09-07 ENCOUNTER — Encounter (HOSPITAL_COMMUNITY): Payer: Self-pay

## 2020-09-07 ENCOUNTER — Other Ambulatory Visit: Payer: Self-pay

## 2020-09-07 DIAGNOSIS — C155 Malignant neoplasm of lower third of esophagus: Secondary | ICD-10-CM | POA: Insufficient documentation

## 2020-09-07 MED ORDER — SODIUM CHLORIDE (PF) 0.9 % IJ SOLN
INTRAMUSCULAR | Status: AC
  Filled 2020-09-07: qty 50

## 2020-09-07 MED ORDER — IOHEXOL 300 MG/ML  SOLN
100.0000 mL | Freq: Once | INTRAMUSCULAR | Status: AC | PRN
  Administered 2020-09-07: 100 mL via INTRAVENOUS

## 2020-09-11 ENCOUNTER — Telehealth: Payer: Self-pay

## 2020-09-11 NOTE — Progress Notes (Signed)
Please let pt know his CT scan results, no evidence of cancer recurrence which is good news, some post-op changes, no new concerns, thanks   Brett Dawson  09/11/2020

## 2020-09-11 NOTE — Telephone Encounter (Signed)
-----   Message from [name], MD sent at 09/11/2020  6:44 AM EDT ----- Please let pt know his CT scan results, no evidence of cancer recurrence which is good news, some post-op changes, no new concerns, thanks   [name]  09/11/2020

## 2020-09-11 NOTE — Telephone Encounter (Signed)
-----   Message from Yan Feng, MD sent at 09/11/2020  6:44 AM EDT ----- Please let pt know his CT scan results, no evidence of cancer recurrence which is good news, some post-op changes, no new concerns, thanks   Yan Feng  09/11/2020  

## 2020-09-11 NOTE — Telephone Encounter (Signed)
I spoke with Brett Dawson and let him know the CT scan results per Dr Burr Medico.  He verbalized understanding.

## 2020-09-28 ENCOUNTER — Other Ambulatory Visit: Payer: Self-pay | Admitting: *Deleted

## 2020-09-28 NOTE — Patient Outreach (Signed)
[facility]) Care Management  09/28/2020  Brett Dawson July 18, 1955 [id number]   [facility] Unsuccessful outreach  [name] was referred to [facility] on 11/24/19 by [facility] Referral Reason:esophageal tumor with invasion into cradia  Admissions x3in last 6 months 06/13/20-06/23/20 wound infectionLoculated septate left pleural effusion with placement of 12 French left pigtail chest tube (drain) on 06/15/20 05/29/20-05/30/20 s/p esophagogastroscopy, esophageal stent removal and esophagram with Omnipaque by Dr Kipp Brood on 05/29/20 history of minimally invasive Ivor Lewis esophagectomy, anastomotic leak, Status post esophageal stent placement  10/21/21admission for robotic assisted Ivor-Lewis esophagectomy with jejunostomy tube placement by Dr Kipp Brood -discharged from [place] 04/21/20 home with advanced home health infusions, remote health services - J tube in place 11/23/19 EGD with upper endoscopy Dr [name] 7/16/21AnotherFeraheme dosegiven Pre referralAdmission5/14/21 to 09/19/19 syncopeHgb6.7 given blood+Ferahemeon 09/18/19 dxsystemic iron deficiency anemia observed and Hgb increased to 7.7 followed by [facility] gastroenterology    Outreach attempt to the home number   No answer. [facility] RN CM left HIPAA [facility] Portability and Accountability Act) compliant voicemail message along with CM's contact info.   Review of EPIC notes  08/14/20 Incoming message from patient stating he was returning a call to [place] after 08/11/20 message left by [facility] RN CM  He states he did get food stamps only  Pt had J tube on 08/17/20 and is on regular diet, tolerating  He has followed up with Dr Kipp Brood and Dr Burr Medico (oncologist) twice since April 2022 No evidence of cancer recurrence per CT on 09/11/20 Wt increase 179.8/BMI 25.09 in March 2020, 184.2/BMI 25.7 in April 2022  Dietitian continues to outreach  Anemia improving -encouraged to take iron 1-2  times daily for 2-3 months with colace  Plan: [facility] RN CM placed this [facility] engaged pt on hold per [facility] workflow for pending case closure if no return call from patient  Unsuccessful outreaches on 07/20/20, 07/31/20, 08/11/20, 09/28/20 Unsuccessful letter sent 08/11/20  Joelene Millin L. Lavina Hamman, RN, BSN, [place] Coordinator Office number [phone] Mobile number [phone]  Main [facility] number [phone] Fax number [phone]

## 2020-10-12 ENCOUNTER — Other Ambulatory Visit: Payer: Self-pay | Admitting: *Deleted

## 2020-10-12 NOTE — Patient Outreach (Signed)
[facility]) Care Management  10/12/2020  Brett Dawson 1956-05-03 [id number]   [facility] Case closure   Brett Dawson was referred to [facility] on 11/24/19 by [facility] Referral Reason: esophageal tumor with invasion into cradia    Admissions x 3 in last 6 months 06/13/20-06/23/20 wound infection Loculated septate left pleural effusion with placement of 12 French left pigtail chest tube (drain) on 06/15/20 05/29/20-05/30/20 s/p esophagogastroscopy, esophageal stent removal and esophagram with Omnipaque by Dr Kipp Brood on 05/29/20 history of minimally invasive Ivor Lewis esophagectomy, anastomotic leak, Status post esophageal stent placement   02/24/20 admission for robotic assisted Ivor-Lewis esophagectomy with jejunostomy tube placement by Dr Kipp Brood  - [facility] on 04/21/20 home with advanced home health infusions, remote health services - J tube in place  11/23/19 EGD with upper endoscopy Dr [name] 11/19/19  Another Feraheme dose given Pre referral Admission 09/17/19 to 09/19/19 syncope Hgb 6.7 given blood + Feraheme on 09/18/19 dx  systemic iron deficiency anemia observed and Hgb increased to 7.7 followed by [facility] gastroenterology   Unsuccessful outreaches on 07/20/20, 07/31/20, 08/11/20, 09/28/20 Unsuccessful letter sent 08/11/20  Plan [facility] RN CM will close case after no response from patient within 10 business days. Unable to maintain contact Case closure letters sent to patient and MD  Brett Millin L. Lavina Hamman, RN, BSN, [place] Coordinator Office number [phone] Mobile number [phone]  Main [facility] number [phone] Fax number [phone]

## 2020-11-14 ENCOUNTER — Observation Stay (HOSPITAL_COMMUNITY)
Admission: EM | Admit: 2020-11-14 | Discharge: 2020-11-16 | Disposition: A | Discharge status: Home / Self Care | Payer: 59 | Attending: Internal Medicine | Admitting: Internal Medicine

## 2020-11-14 ENCOUNTER — Emergency Department (HOSPITAL_COMMUNITY): Payer: 59

## 2020-11-14 DIAGNOSIS — R0600 Dyspnea, unspecified: Secondary | ICD-10-CM | POA: Insufficient documentation

## 2020-11-14 DIAGNOSIS — Z8501 Personal history of malignant neoplasm of esophagus: Secondary | ICD-10-CM | POA: Diagnosis not present

## 2020-11-14 DIAGNOSIS — R55 Syncope and collapse: Principal | ICD-10-CM | POA: Insufficient documentation

## 2020-11-14 DIAGNOSIS — Z20822 Contact with and (suspected) exposure to covid-19: Secondary | ICD-10-CM | POA: Diagnosis not present

## 2020-11-14 DIAGNOSIS — E876 Hypokalemia: Secondary | ICD-10-CM

## 2020-11-14 DIAGNOSIS — C155 Malignant neoplasm of lower third of esophagus: Secondary | ICD-10-CM | POA: Diagnosis present

## 2020-11-14 DIAGNOSIS — Y900 Blood alcohol level of less than 20 mg/100 ml: Secondary | ICD-10-CM | POA: Diagnosis not present

## 2020-11-14 DIAGNOSIS — Z79899 Other long term (current) drug therapy: Secondary | ICD-10-CM | POA: Diagnosis not present

## 2020-11-14 DIAGNOSIS — D649 Anemia, unspecified: Secondary | ICD-10-CM

## 2020-11-14 LAB — CBC WITH DIFFERENTIAL/PLATELET
Abs Immature Granulocytes: 0.02 10*3/uL (ref 0.00–0.07)
Basophils Absolute: 0 10*3/uL (ref 0.0–0.1)
Basophils Relative: 0 %
Eosinophils Absolute: 0.1 10*3/uL (ref 0.0–0.5)
Eosinophils Relative: 2 %
HCT: 36.9 % — ABNORMAL LOW (ref 39.0–52.0)
Hemoglobin: 11.7 g/dL — ABNORMAL LOW (ref 13.0–17.0)
Immature Granulocytes: 0 %
Lymphocytes Relative: 8 %
Lymphs Abs: 0.5 10*3/uL — ABNORMAL LOW (ref 0.7–4.0)
MCH: 30.1 pg (ref 26.0–34.0)
MCHC: 31.7 g/dL (ref 30.0–36.0)
MCV: 94.9 fL (ref 80.0–100.0)
Monocytes Absolute: 0.6 10*3/uL (ref 0.1–1.0)
Monocytes Relative: 10 %
Neutro Abs: 4.5 10*3/uL (ref 1.7–7.7)
Neutrophils Relative %: 80 %
Platelets: 199 10*3/uL (ref 150–400)
RBC: 3.89 MIL/uL — ABNORMAL LOW (ref 4.22–5.81)
RDW: 15.3 % (ref 11.5–15.5)
WBC: 5.8 10*3/uL (ref 4.0–10.5)
nRBC: 0 % (ref 0.0–0.2)

## 2020-11-14 LAB — BASIC METABOLIC PANEL
Anion gap: 8 (ref 5–15)
BUN: 12 mg/dL (ref 8–23)
CO2: 23 mmol/L (ref 22–32)
Calcium: 8.7 mg/dL — ABNORMAL LOW (ref 8.9–10.3)
Chloride: 109 mmol/L (ref 98–111)
Creatinine, Ser: 0.67 mg/dL (ref 0.61–1.24)
GFR, Estimated: >60 mL/min (ref >60)
Glucose, Bld: 79 mg/dL (ref 70–99)
Potassium: 3.4 mmol/L — ABNORMAL LOW (ref 3.5–5.1)
Sodium: 140 mmol/L (ref 135–145)

## 2020-11-14 LAB — ETHANOL: Alcohol, Ethyl (B): <10 mg/dL (ref <10)

## 2020-11-14 LAB — BRAIN NATRIURETIC PEPTIDE: B Natriuretic Peptide: 175.7 pg/mL — ABNORMAL HIGH (ref 0.0–100.0)

## 2020-11-14 LAB — MAGNESIUM: Magnesium: 1.7 mg/dL (ref 1.7–2.4)

## 2020-11-14 LAB — TROPONIN I (HIGH SENSITIVITY): Troponin I (High Sensitivity): 4 ng/L (ref <18)

## 2020-11-14 MED ORDER — SODIUM CHLORIDE 0.9 % IV BOLUS
1000.0000 mL | Freq: Once | INTRAVENOUS | Status: AC
  Administered 2020-11-14: 1000 mL via INTRAVENOUS

## 2020-11-14 NOTE — ED Triage Notes (Signed)
Pt bib gems after witnessed syncopal episode that lasted 5-10 seconds. No fall, negative head trauma. Pt states he had one beer tonight. Intial pressure 70/50, 500 ml NS given - repeat BP 90/70. Pt A&Ox4 on arrival.   Spo2: 98% RA  HR: 65  CBG: 159

## 2020-11-14 NOTE — ED Provider Notes (Signed)
Care assumed from Dr. Roslynn Amble, patient with syncope without concerning historical findings.  Work-up is pending, anticipate discharge if work-up is negative.  CBC shows stable anemia.  Metabolic panel shows mild hypokalemia.  Initial troponin is normal, BNP slightly elevated.  ECG shows new T wave inversions in the inferior leads.  With new ECG changes, he will need to be admitted.  Case is discussed with Dr. Alcario Drought of [facility], who agrees to admit the patient.   EKG Interpretation  Date/Time:  Wednesday November 15 2020 00:14:34 EDT Ventricular Rate:  60 PR Interval:  164 QRS Duration: 102 QT Interval:  436 QTC Calculation: 436 R Axis:   57 Text Interpretation: Normal sinus rhythm T wave abnormality, consider inferior ischemia Abnormal ECG When compared with ECG of 03/05/2020, T wave inversion Inferior leads is now present HEART RATE has decreased Confirmed by Delora Fuel ([id number]) on 11/15/2020 68:86:48 AM           Delora Fuel, MD 47/20/72 0105

## 2020-11-14 NOTE — ED Notes (Signed)
Patient transported to X-ray 

## 2020-11-14 NOTE — ED Provider Notes (Signed)
[facility] EMERGENCY DEPARTMENT Provider Note   CSN: [id number] Arrival date & time: 11/14/20  2157     History Chief Complaint  Patient presents with   Loss of Consciousness    ESPIRIDION SUPINSKI is a 65 y.o. male.  Presents to ER with concern for syncope.  Patient reports that he experienced a syncopal episode earlier this evening.  States that he recalls feeling somewhat lightheaded prior to the incident.  Does not remember exactly.  Denies hitting head.  EMS report no known trauma.  Patient has no acute complaints at present.  Reports having an episode of syncope years ago but none recently.  EMS reported patient was initially hypotensive and blood pressure responded to small fluid bolus.  Patient denies any chest pain, no difficulty in breathing, no fevers, cough.  Has history of esophageal cancer.  Reports most recent scan was reassuring and he believes his cancer is in remission.  Not currently on any therapy for cancer.  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   Esophagectomy, anastomotic leak 05/29/2020   Constipation 05/16/2020   Gastroesophageal reflux disease 05/16/2020   Iron deficiency anemia 05/16/2020   Obesity 05/16/2020   Periodontitis 05/16/2020   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   Malnutrition ([place]) 02/25/2020   Esophageal cancer ([place]) 02/24/2020   Gout 12/14/2019   Malignant neoplasm of  lower third of esophagus ([place]) 11/24/2019   Esophageal mass 11/12/2019   Syncope    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    Past Surgical History:  Procedure Laterality Date   BIOPSY  11/23/2019   Procedure: BIOPSY;  Surgeon: Arta Silence, MD;  [facility]: [facility];  Service: Endoscopy;;   ESOPHAGEAL STENT PLACEMENT N/A 04/11/2020   Procedure: ESOPHAGOGASTROSCOPY WITH ESOPHAGEAL STENT PLACEMENT;  Surgeon: Lajuana Matte, MD;  Location: [place];  Service: Thoracic;  Laterality: N/A;   ESOPHAGEAL STENT PLACEMENT N/A 05/29/2020   Procedure: ESOPHAGEAL STENT REMOVAL;  Surgeon: Lajuana Matte, MD;  Location: [place];  Service: Thoracic;  Laterality: N/A;   ESOPHAGOGASTRODUODENOSCOPY N/A 02/24/2020   Procedure: ESOPHAGOGASTRODUODENOSCOPY (EGD);  Surgeon: Lajuana Matte, MD;  Location: [facility] OR;  Service: Thoracic;  Laterality: N/A;   ESOPHAGOGASTRODUODENOSCOPY N/A 05/29/2020   Procedure: ESOPHAGOGASTRODUODENOSCOPY (EGD);  Surgeon: Lajuana Matte, MD;  Location: [facility] OR;  Service: Thoracic;  Laterality: N/A;   ESOPHAGOGASTRODUODENOSCOPY (EGD) WITH PROPOFOL N/A 11/23/2019   Procedure: ESOPHAGOGASTRODUODENOSCOPY (EGD) WITH PROPOFOL;  Surgeon: Arta Silence, MD;  [facility];  Service: Endoscopy;  Laterality: N/A;   INTERCOSTAL NERVE BLOCK Right 02/24/2020   Procedure: INTERCOSTAL NERVE BLOCK;  Surgeon: Lajuana Matte, MD;  Location: [place];  Service: Thoracic;  Laterality: Right;   IR EMBO ART  VEN HEMORR LYMPH EXTRAV  INC GUIDE ROADMAPPING  04/07/2020   IR FLUORO GUIDED NEEDLE PLC ASPIRATION/INJECTION LOC  03/23/2020  IR LYMPHANGIOGRAM PEL/ABD BILAT  03/23/2020   IR LYMPHANGIOGRAM PEL/ABD BILAT  04/07/2020   IR US GUIDANCE  03/23/2020   IR US GUIDANCE  03/23/2020   IR US GUIDE VASC ACCESS LEFT  04/07/2020   IR US GUIDE VASC ACCESS RIGHT  04/07/2020   RADIOLOGY WITH ANESTHESIA N/A 03/23/2020   Procedure: Malignant neoplasm of esophagus;  Surgeon: Sandi Mariscal,  MD;  Location: [place];  Service: Radiology;  Laterality: N/A;   RADIOLOGY WITH ANESTHESIA N/A 04/07/2020   Procedure: IR WITH ANESTHESIA LYMPHANGIOGRAM BILATERAL WITH EMBOLIZATION;  Surgeon: Criselda Peaches, MD;  Location: [place];  Service: Radiology;  Laterality: N/A;   TONSILLECTOMY     VIDEO ASSISTED THORACOSCOPY (VATS)/EMPYEMA Left 04/11/2020   Procedure: LEFT VIDEO ASSISTED THORACOSCOPY (VATS) WITH IRRIGATION AND CHEST TUBE PLACEMENT;  Surgeon: Lajuana Matte, MD;  [facility];  Service: Thoracic;  Laterality: Left;       Family History  Problem Relation Age of Onset   Hyperlipidemia Mother     Social History   Tobacco Use   Smoking status: Never   Smokeless tobacco: Never  Vaping Use   Vaping Use: Former  Substance Use Topics   Alcohol use: Not Currently   Drug use: Yes    Types: Marijuana    Comment: last time 02/2020    Home Medications Prior to Admission medications   Medication Sig Start Date End Date Taking? Authorizing Provider  Acetaminophen (ACETAMINOPHEN EXTRA STRENGTH) 500 MG capsule     [provider]  amoxicillin-clavulanate (AUGMENTIN) 875-125 MG tablet  06/23/20   [provider]  Colchicine 0.6 MG CAPS     [provider]  Diaper Rash Products ([place])  06/23/20   [provider]  famotidine (PEPCID) 40 MG/5ML suspension     [provider]  fluticasone (FLONASE) 50 MCG/ACT nasal spray PLACE 2 SPRAYS INTO BOTH NOSTRILS DAILY. 04/21/20 04/21/21  Barrett, Lodema Hong, PA-C  guaiFENesin (ROBITUSSIN) 100 MG/5ML liquid  04/21/20   [provider]  lactose free nutrition (BOOST PLUS) LIQD Take 237 mLs by mouth 3 (three) times daily with meals. 06/23/20   John Giovanni, PA-C  liver oil-zinc oxide (DESITIN) 40 % ointment Apply topically daily. 06/24/20   John Giovanni, PA-C  Nutritional Supplements ([place]) LIQD  04/21/20   [provider]  pantoprazole (PROTONIX) 40 MG  tablet     [provider]  WATER FOR INJECTION STERILE IJ  04/21/20   [provider]    Allergies    Bee venom and Strawberry extract  Review of Systems   Review of Systems  Constitutional:  Negative for chills and fever.  HENT:  Negative for ear pain and sore throat.   Eyes:  Negative for pain and visual disturbance.  Respiratory:  Negative for cough and shortness of breath.   Cardiovascular:  Negative for chest pain and palpitations.  Gastrointestinal:  Negative for abdominal pain and vomiting.  Genitourinary:  Negative for dysuria and hematuria.  Musculoskeletal:  Negative for arthralgias and back pain.  Skin:  Negative for color change and rash.  Neurological:  Positive for syncope and light-headedness. Negative for seizures.  All other systems reviewed and are negative.  Physical Exam Updated Vital Signs BP 117/74   Pulse (!) 59   Temp 98 F (36.7 C)   Resp 14   Ht 5\' 11"  (1.803 m)   Wt 81.6 kg   SpO2 99%   BMI 25.10 kg/m  Physical Exam Vitals and nursing note reviewed.  Constitutional:      Appearance: He is well-developed.  HENT:     Head: Normocephalic and atraumatic.  Eyes:     Conjunctiva/sclera: Conjunctivae normal.  Cardiovascular:     Rate and Rhythm: Normal rate and regular rhythm.     Heart sounds: No murmur heard. Pulmonary:     Effort: Pulmonary effort is normal. No respiratory distress.     Breath sounds: Normal breath sounds.  Abdominal:     Palpations: Abdomen is soft.     Tenderness: There is no abdominal tenderness.  Musculoskeletal:        General: No deformity or signs of injury.     Cervical back: Neck supple.  Skin:    General: Skin is warm and dry.  Neurological:     General: No focal deficit present.     Mental Status: He is alert.  Psychiatric:        Mood and Affect: Mood normal.        Behavior: Behavior normal.    ED Results / Procedures / Treatments   Labs (all labs ordered are listed, but only  abnormal results are displayed) Labs Reviewed  CBC WITH DIFFERENTIAL/PLATELET - Abnormal; Notable for the following components:      Result Value   RBC 3.89 (*)    Hemoglobin 11.7 (*)    HCT 36.9 (*)    Lymphs Abs 0.5 (*)    All other components within normal limits  BASIC METABOLIC PANEL - Abnormal; Notable for the following components:   Potassium 3.4 (*)    Calcium 8.7 (*)    All other components within normal limits  BRAIN NATRIURETIC PEPTIDE - Abnormal; Notable for the following components:   B Natriuretic Peptide 175.7 (*)    All other components within normal limits  MAGNESIUM  ETHANOL  URINALYSIS, ROUTINE W REFLEX MICROSCOPIC  RAPID URINE DRUG SCREEN, HOSP PERFORMED  TROPONIN I (HIGH SENSITIVITY)    EKG None  Radiology DG Chest 2 View  Result Date: 11/14/2020 CLINICAL DATA:  Syncope EXAM: CHEST - 2 VIEW COMPARISON:  Radiograph 08/17/2020, CT 06/19/2020 FINDINGS: Stable positioning of coil pack for prior thoracic duct embolization. Cardiomediastinal contours may be slightly accentuated due to low volumes and portable technique. Contours are otherwise unremarkable. Low volumes and streaky atelectatic changes. Some persistent bandlike scarring is seen in the bilateral lung bases. Suspect some chronic layering left effusion as well. Lungs are otherwise clear. Degenerative changes are present in the imaged spine and shoulders. No acute osseous or chest wall abnormality. Telemetry leads overlie the chest. IMPRESSION: 1. Low volumes and atelectasis. 2. Chronic bibasilar scarring and likely chronic left effusion. 3. Prior thoracic duct embolization. Electronically Signed   By: Lovena Le M.D.   On: 11/14/2020 23:28    Procedures Procedures   Medications Ordered in ED Medications  sodium chloride 0.9 % bolus 1,000 mL (1,000 mLs Intravenous New Bag/Given 11/14/20 2252)    ED Course  I have reviewed the triage vital signs and the nursing notes.  Pertinent labs & imaging  results that were available during my care of the patient were reviewed by me and considered in my medical decision making (see chart for details).    MDM Rules/Calculators/A&P                          65 year old male presents to ER after syncopal episode.  EMS also reported hypotension.  In ER, patient appears well, vital stable.  Will check EKG, CXR, basic labs, observe in ER.  While awaiting further work up, reassessment, signed out to Dr. Roxanne Mins.  Refer to his note for final plan and disposition.  Final Clinical Impression(s) / ED Diagnoses Final diagnoses:  Syncope, unspecified syncope type    Rx / DC Orders ED Discharge Orders     None        Lucrezia Starch, MD 11/14/20 2352

## 2020-11-15 ENCOUNTER — Encounter (HOSPITAL_COMMUNITY): Payer: Self-pay | Admitting: Internal Medicine

## 2020-11-15 ENCOUNTER — Observation Stay (HOSPITAL_COMMUNITY): Payer: 59

## 2020-11-15 ENCOUNTER — Other Ambulatory Visit: Payer: Self-pay

## 2020-11-15 ENCOUNTER — Observation Stay (HOSPITAL_BASED_OUTPATIENT_CLINIC_OR_DEPARTMENT_OTHER): Payer: 59

## 2020-11-15 DIAGNOSIS — I2584 Coronary atherosclerosis due to calcified coronary lesion: Secondary | ICD-10-CM

## 2020-11-15 DIAGNOSIS — C155 Malignant neoplasm of lower third of esophagus: Secondary | ICD-10-CM | POA: Diagnosis not present

## 2020-11-15 DIAGNOSIS — E876 Hypokalemia: Secondary | ICD-10-CM | POA: Diagnosis not present

## 2020-11-15 DIAGNOSIS — R55 Syncope and collapse: Secondary | ICD-10-CM | POA: Diagnosis not present

## 2020-11-15 DIAGNOSIS — I2510 Atherosclerotic heart disease of native coronary artery without angina pectoris: Secondary | ICD-10-CM | POA: Diagnosis not present

## 2020-11-15 LAB — CBG MONITORING, ED
Glucose-Capillary: 89 mg/dL (ref 70–99)
Glucose-Capillary: 98 mg/dL (ref 70–99)

## 2020-11-15 LAB — URINALYSIS, ROUTINE W REFLEX MICROSCOPIC
Bilirubin Urine: NEGATIVE
Glucose, UA: NEGATIVE mg/dL
Hgb urine dipstick: NEGATIVE
Ketones, ur: 5 mg/dL — AB
Leukocytes,Ua: NEGATIVE
Nitrite: NEGATIVE
Protein, ur: NEGATIVE mg/dL
Specific Gravity, Urine: 1.016 (ref 1.005–1.030)
pH: 5 (ref 5.0–8.0)

## 2020-11-15 LAB — ECHOCARDIOGRAM COMPLETE
Area-P 1/2: 1.94 cm2
Height: 71 in
S' Lateral: 3.9 cm
Weight: 2880 oz

## 2020-11-15 LAB — TROPONIN I (HIGH SENSITIVITY): Troponin I (High Sensitivity): 6 ng/L (ref <18)

## 2020-11-15 LAB — RAPID URINE DRUG SCREEN, HOSP PERFORMED
Amphetamines: NOT DETECTED
Barbiturates: NOT DETECTED
Benzodiazepines: NOT DETECTED
Cocaine: NOT DETECTED
Opiates: NOT DETECTED
Tetrahydrocannabinol: POSITIVE — AB

## 2020-11-15 LAB — CBC
HCT: 33.3 % — ABNORMAL LOW (ref 39.0–52.0)
Hemoglobin: 10.5 g/dL — ABNORMAL LOW (ref 13.0–17.0)
MCH: 29.4 pg (ref 26.0–34.0)
MCHC: 31.5 g/dL (ref 30.0–36.0)
MCV: 93.3 fL (ref 80.0–100.0)
Platelets: 176 10*3/uL (ref 150–400)
RBC: 3.57 MIL/uL — ABNORMAL LOW (ref 4.22–5.81)
RDW: 15.1 % (ref 11.5–15.5)
WBC: 4.8 10*3/uL (ref 4.0–10.5)
nRBC: 0 % (ref 0.0–0.2)

## 2020-11-15 LAB — SARS CORONAVIRUS 2 (TAT 6-24 HRS): SARS Coronavirus 2: NEGATIVE

## 2020-11-15 LAB — HIV ANTIBODY (ROUTINE TESTING W REFLEX): HIV Screen 4th Generation wRfx: NONREACTIVE

## 2020-11-15 LAB — BASIC METABOLIC PANEL
Anion gap: 4 — ABNORMAL LOW (ref 5–15)
BUN: 9 mg/dL (ref 8–23)
CO2: 24 mmol/L (ref 22–32)
Calcium: 8.4 mg/dL — ABNORMAL LOW (ref 8.9–10.3)
Chloride: 109 mmol/L (ref 98–111)
Creatinine, Ser: 0.58 mg/dL — ABNORMAL LOW (ref 0.61–1.24)
GFR, Estimated: >60 mL/min (ref >60)
Glucose, Bld: 98 mg/dL (ref 70–99)
Potassium: 3.8 mmol/L (ref 3.5–5.1)
Sodium: 137 mmol/L (ref 135–145)

## 2020-11-15 MED ORDER — SODIUM CHLORIDE 0.9 % IV SOLN: INTRAVENOUS | Status: DC

## 2020-11-15 MED ORDER — NITROGLYCERIN 0.4 MG SL SUBL: 0.8000 mg | SUBLINGUAL_TABLET | Freq: Once | SUBLINGUAL | Status: AC

## 2020-11-15 MED ORDER — ONDANSETRON HCL 4 MG PO TABS
4.0000 mg | ORAL_TABLET | Freq: Four times a day (QID) | ORAL | Status: DC | PRN
Start: 2020-11-15 — End: 2020-11-16

## 2020-11-15 MED ORDER — IOHEXOL 350 MG/ML SOLN
95.0000 mL | Freq: Once | INTRAVENOUS | Status: AC | PRN
  Administered 2020-11-15: 95 mL via INTRAVENOUS

## 2020-11-15 MED ORDER — ENOXAPARIN SODIUM 40 MG/0.4ML IJ SOSY
40.0000 mg | PREFILLED_SYRINGE | INTRAMUSCULAR | Status: DC
  Administered 2020-11-15 – 2020-11-16 (×2): 40 mg via SUBCUTANEOUS
  Filled 2020-11-15 (×2): qty 0.4

## 2020-11-15 MED ORDER — ONDANSETRON HCL 4 MG/2ML IJ SOLN
4.0000 mg | Freq: Four times a day (QID) | INTRAMUSCULAR | Status: DC | PRN
Start: 2020-11-15 — End: 2020-11-16

## 2020-11-15 MED ORDER — ACETAMINOPHEN 325 MG PO TABS
650.0000 mg | ORAL_TABLET | Freq: Four times a day (QID) | ORAL | Status: DC | PRN
Start: 2020-11-15 — End: 2020-11-16

## 2020-11-15 MED ORDER — ACETAMINOPHEN 650 MG RE SUPP: 650.0000 mg | Freq: Four times a day (QID) | RECTAL | Status: DC | PRN

## 2020-11-15 MED ORDER — POTASSIUM CHLORIDE CRYS ER 20 MEQ PO TBCR
40.0000 meq | EXTENDED_RELEASE_TABLET | Freq: Once | ORAL | Status: AC
  Administered 2020-11-15: 40 meq via ORAL
  Filled 2020-11-15: qty 2

## 2020-11-15 MED ORDER — SODIUM CHLORIDE 0.9% FLUSH: 3.0000 mL | Freq: Two times a day (BID) | INTRAVENOUS | Status: DC

## 2020-11-15 MED ORDER — NITROGLYCERIN 0.4 MG SL SUBL
SUBLINGUAL_TABLET | SUBLINGUAL | Status: AC
  Administered 2020-11-15: 0.8 mg via SUBLINGUAL
  Filled 2020-11-15: qty 2

## 2020-11-15 NOTE — Progress Notes (Signed)
  Echocardiogram 2D Echocardiogram has been performed.  Brett Dawson 11/15/2020, 9:31 AM

## 2020-11-15 NOTE — ED Notes (Signed)
Report given to Verner Chol, RN of [phone]

## 2020-11-15 NOTE — H&P (Signed)
History and Physical    [name]:[id number] DOB: 05/06/1956 DOA: 11/14/2020  PCP: Deatra James, MD  Patient coming from: Home  I have personally briefly reviewed patient's old medical records in [facility]  Chief Complaint: Syncope  HPI: Brett Dawson is a 65 y.o. male with medical history significant of esophageal CA s/p radiation and esophagectomy.  In remission as of CT scan in May this year.  Pt presents to ED with c/o Syncope.  Had syncope earlier this evening.  Felt lightheaded prior to event.  Didn't hit head.  No known trauma.  Had episode years ago but none recently.  No fevers, CP, cough.   ED Course: EKG demonstrates new TWI in inferior and lateral leads.   Review of Systems: As per HPI, otherwise all review of systems negative.  Past Medical History:  Diagnosis Date   Anemia    Anemia    Anginal pain (H[facility])    as a child   Dyspnea    due to weakness   Gout    History of blood transfusion    Malignant neoplasm of lower third of esophagus (H[facility]) 11/24/2019    Past Surgical History:  Procedure Laterality Date   BIOPSY  11/23/2019   Procedure: BIOPSY;  Surgeon: Willis Modena, MD;  [facility];  Service: Endoscopy;;   ESOPHAGEAL STENT PLACEMENT N/A 04/11/2020   Procedure: ESOPHAGOGASTROSCOPY WITH ESOPHAGEAL STENT PLACEMENT;  Surgeon: Corliss Skains, MD;  [facility];  Service: Thoracic;  Laterality: N/A;   ESOPHAGEAL STENT PLACEMENT N/A 05/29/2020   Procedure: ESOPHAGEAL STENT REMOVAL;  Surgeon: Corliss Skains, MD;  [facility];  Service: Thoracic;  Laterality: N/A;   ESOPHAGOGASTRODUODENOSCOPY N/A 02/24/2020   Procedure: ESOPHAGOGASTRODUODENOSCOPY (EGD);  Surgeon: Corliss Skains, MD;  Location: [facility] OR;  Service: Thoracic;  Laterality: N/A;   ESOPHAGOGASTRODUODENOSCOPY N/A 05/29/2020   Procedure: ESOPHAGOGASTRODUODENOSCOPY (EGD);  Surgeon: Corliss Skains, MD;  Location: [facility] OR;  Service: Thoracic;  Laterality:  N/A;   ESOPHAGOGASTRODUODENOSCOPY (EGD) WITH PROPOFOL N/A 11/23/2019   Procedure: ESOPHAGOGASTRODUODENOSCOPY (EGD) WITH PROPOFOL;  Surgeon: Willis Modena, MD;  [facility];  Service: Endoscopy;  Laterality: N/A;   INTERCOSTAL NERVE BLOCK Right 02/24/2020   Procedure: INTERCOSTAL NERVE BLOCK;  Surgeon: Corliss Skains, MD;  [facility];  Service: Thoracic;  Laterality: Right;   IR EMBO ART  VEN HEMORR LYMPH EXTRAV  INC GUIDE ROADMAPPING  04/07/2020   IR FLUORO GUIDED NEEDLE PLC ASPIRATION/INJECTION LOC  03/23/2020   IR LYMPHANGIOGRAM PEL/ABD BILAT  03/23/2020   IR LYMPHANGIOGRAM PEL/ABD BILAT  04/07/2020   IR US GUIDANCE  03/23/2020   IR US GUIDANCE  03/23/2020   IR US GUIDE VASC ACCESS LEFT  04/07/2020   IR US GUIDE VASC ACCESS RIGHT  04/07/2020   RADIOLOGY WITH ANESTHESIA N/A 03/23/2020   Procedure: Malignant neoplasm of esophagus;  Surgeon: Simonne Come, MD;  Location: [facility] OR;  Service: Radiology;  Laterality: N/A;   RADIOLOGY WITH ANESTHESIA N/A 04/07/2020   Procedure: IR WITH ANESTHESIA LYMPHANGIOGRAM BILATERAL WITH EMBOLIZATION;  Surgeon: Sterling Big, MD;  Location: [facility] OR;  Service: Radiology;  Laterality: N/A;   TONSILLECTOMY     VIDEO ASSISTED THORACOSCOPY (VATS)/EMPYEMA Left 04/11/2020   Procedure: LEFT VIDEO ASSISTED THORACOSCOPY (VATS) WITH IRRIGATION AND CHEST TUBE PLACEMENT;  Surgeon: Corliss Skains, MD;  [facility];  Service: Thoracic;  Laterality: Left;     reports that he has never smoked. He has never used smokeless tobacco.  He reports previous alcohol use. He reports current drug use. Drug: Marijuana.  Allergies  Allergen Reactions   Bee Venom Swelling   Strawberry Extract Other (See Comments)    Family History  Problem Relation Age of Onset   Hyperlipidemia Mother      Prior to Admission medications   Medication Sig Start Date End Date Taking? Authorizing Provider  fluticasone (FLONASE) 50 MCG/ACT nasal spray PLACE 2 SPRAYS INTO  BOTH NOSTRILS DAILY. Patient not taking: Reported on 11/15/2020 04/21/20 04/21/21  Barrett, Rae Roam, PA-C  lactose free nutrition (BOOST PLUS) LIQD Take 237 mLs by mouth 3 (three) times daily with meals. Patient not taking: Reported on 11/15/2020 06/23/20   Gershon Crane E, PA-C  liver oil-zinc oxide (DESITIN) 40 % ointment Apply topically daily. Patient not taking: Reported on 11/15/2020 06/24/20   Rowe Clack, [place]    Physical Exam: Vitals:   11/14/20 2202 11/14/20 2215 11/14/20 2230 11/14/20 2245  BP:  119/75 116/72 117/74  Pulse:  62 63 (!) 59  Resp:  19 13 14   Temp:  98 F (36.7 C)    SpO2:  97% 99% 99%  Weight: 81.6 kg     Height: 5\' 11"  (1.803 m)       Constitutional: NAD, calm, comfortable Eyes: PERRL, lids and conjunctivae normal ENMT: Mucous membranes are moist. Posterior pharynx clear of any exudate or lesions.Normal dentition.  Neck: normal, supple, no masses, no thyromegaly Respiratory: clear to auscultation bilaterally, no wheezing, no crackles. Normal respiratory effort. No accessory muscle use.  Cardiovascular: Regular rate and rhythm, no murmurs / rubs / gallops. No extremity edema. 2+ pedal pulses. No carotid bruits.  Abdomen: no tenderness, no masses palpated. No hepatosplenomegaly. Bowel sounds positive.  Musculoskeletal: no clubbing / cyanosis. No joint deformity upper and lower extremities. Good ROM, no contractures. Normal muscle tone.  Skin: no rashes, lesions, ulcers. No induration Neurologic: CN 2-12 grossly intact. Sensation intact, DTR normal. Strength 5/5 in all 4.  Psychiatric: Normal judgment and insight. Alert and oriented x 3. Normal mood.    Labs on Admission: I have personally reviewed following labs and imaging studies  CBC: Recent Labs  Lab 11/14/20 2231  WBC 5.8  NEUTROABS 4.5  HGB 11.7*  HCT 36.9*  MCV 94.9  PLT 199   Basic Metabolic Panel: Recent Labs  Lab 11/14/20 2231  NA 140  K 3.4*  CL 109  CO2 23  GLUCOSE 79  BUN 12   CREATININE 0.67  CALCIUM 8.7*  MG 1.7   GFR: Estimated Creatinine Clearance: 99.4 mL/min (by C-G formula based on SCr of 0.67 mg/dL). Liver Function Tests: No results for input(s): AST, ALT, ALKPHOS, BILITOT, PROT, ALBUMIN in the last 168 hours. No results for input(s): LIPASE, AMYLASE in the last 168 hours. No results for input(s): AMMONIA in the last 168 hours. Coagulation Profile: No results for input(s): INR, PROTIME in the last 168 hours. Cardiac Enzymes: No results for input(s): CKTOTAL, CKMB, CKMBINDEX, TROPONINI in the last 168 hours. BNP (last 3 results) No results for input(s): PROBNP in the last 8760 hours. HbA1C: No results for input(s): HGBA1C in the last 72 hours. CBG: No results for input(s): GLUCAP in the last 168 hours. Lipid Profile: No results for input(s): CHOL, HDL, LDLCALC, TRIG, CHOLHDL, LDLDIRECT in the last 72 hours. Thyroid Function Tests: No results for input(s): TSH, T4TOTAL, FREET4, T3FREE, THYROIDAB in the last 72 hours. Anemia Panel: No results for input(s): VITAMINB12, FOLATE, FERRITIN, TIBC, IRON, RETICCTPCT in the  last 72 hours. Urine analysis:    Component Value Date/Time   COLORURINE YELLOW 02/22/2020 0831   APPEARANCEUR CLOUDY (A) 02/22/2020 0831   LABSPEC 1.014 02/22/2020 0831   PHURINE 7.0 02/22/2020 0831   GLUCOSEU NEGATIVE 02/22/2020 0831   HGBUR NEGATIVE 02/22/2020 0831   BILIRUBINUR NEGATIVE 02/22/2020 0831   BILIRUBINUR negative 05/21/2017 1046   KETONESUR NEGATIVE 02/22/2020 0831   PROTEINUR NEGATIVE 02/22/2020 0831   UROBILINOGEN 2.0 (A) 05/21/2017 1046   NITRITE NEGATIVE 02/22/2020 0831   LEUKOCYTESUR NEGATIVE 02/22/2020 0831    Radiological Exams on Admission: DG Chest 2 View  Result Date: 11/14/2020 CLINICAL DATA:  Syncope EXAM: CHEST - 2 VIEW COMPARISON:  Radiograph 08/17/2020, CT 06/19/2020 FINDINGS: Stable positioning of coil pack for prior thoracic duct embolization. Cardiomediastinal contours may be slightly  accentuated due to low volumes and portable technique. Contours are otherwise unremarkable. Low volumes and streaky atelectatic changes. Some persistent bandlike scarring is seen in the bilateral lung bases. Suspect some chronic layering left effusion as well. Lungs are otherwise clear. Degenerative changes are present in the imaged spine and shoulders. No acute osseous or chest wall abnormality. Telemetry leads overlie the chest. IMPRESSION: 1. Low volumes and atelectasis. 2. Chronic bibasilar scarring and likely chronic left effusion. 3. Prior thoracic duct embolization. Electronically Signed   By: Kreg Shropshire M.D.   On: 11/14/2020 23:28    EKG: Independently reviewed.  Assessment/Plan Principal Problem:   Syncope Active Problems:   Malignant neoplasm of lower third of esophagus (HCC)    Syncope - Syncope pathway 2d echo Tele monitor EKG with new TWI in inferior and lateral leads: NPO Cards eval in AM H/o esophageal CA - In remission as of last CT (May 2022) following radiation + esophagectomy in Oct 2021.  DVT prophylaxis: Lovenox Code Status: Full Family Communication: No family in room Disposition Plan: Home after syncope w/u Consults called: Message sent to P. Johna Sheriff for AM cards eval Admission status: Place in obs   Aalliyah Kilker, [place] M. DO [facility]  How to contact the [facility] Attending or Consulting provider 7A - 7P or covering provider during after hours 7P -7A, for this patient?  Check the care team in [facility] and look for a) attending/consulting [facility] provider listed and b) the [facility] team listed Log into [email or web address]  Amion Physician Scheduling and messaging for groups and whole hospitals  On call and physician scheduling software for group practices, residents, hospitalists and other medical providers for call, clinic, rotation and shift schedules. On[employer] is a hospital-wide system for scheduling doctors and paging doctors on call. EasyPlot is for scientific  plotting and data analysis.  [email or web address]  and use [place]'s universal password to access. If you do not have the password, please contact the hospital operator.  Locate the [facility] provider you are looking for under [facility] and page to a number that you can be directly reached. If you still have difficulty reaching the provider, please page the [facility] (Director on Call) for the Hospitalists listed on amion for assistance.  11/15/2020, 1:20 AM

## 2020-11-15 NOTE — Progress Notes (Signed)
Brief same-day note  Patient is a 65 year old male with history of esophageal cancer status post radiation, esophagectomy who presents from a restaurant to the emergency department after he had a syncopal episode. EKG showed new T wave inversions in the inferior /lateral leads.  He denied any chest pain.  He was admitted for syncopal work-up Patient seen and examined at the bedside this morning.  He is hemodynamically stable.  He denies any complaints to me including chest pain.  Examination was largely unremarkable. Cardiology has been consulted and recommended coronary CT, 30-day event monitoring on discharge.  We will also check orthostatic vitals.Echo is also pending. Likely discharge tomorrow after full work-up.

## 2020-11-15 NOTE — Consult Note (Signed)
Cardiology Consultation:   Patient ID: Brett Dawson MRN: [id number]; DOB: 1956/03/30  Admit date: 11/14/2020 Date of Consult: 11/15/2020  PCP:  [name], MD   [facility] Providers Cardiologist:  New to [facility] (Dr. Radford Pax)  Patient Profile:   Brett Dawson is a 65 y.o. male with a history of esophageal cancer s/p esophagectomy in 51/8841 complicated by anastomotic leak requiring esophageal stent placement which was able to be removed in 05/2020, anemia, and gout who is being seen or the evaluation of syncope at the request of Dr. Alcario Drought.  History of Present Illness:   Mr. Tift is a 65 year old male with the above history. No known cardiac history. No prior cardiac work-up. He does have a family history of heart disease. His mother has a history of CAD with prior heart attack and stenting. No history of tobacco use. He does report alcohol use. He drinks 2-3 beers about 2 nights per week. No liquor. He also reports smoking marijuana daily.  He has a history of esophageal cancer and underwent minimally invasive Ivor Lewis esophagectomy by Dr. Kipp Brood in 66/0630 which was complicated by chyle leak and anastomotic leak requiring esophageal stent placement in 04/2020 which was ultimately able to be removed in 05/2020. He was then admitted in 06/2020 with post-op wound infection which was treated with IV antibiotics. He required J-tube feedings for several months following esophagectomy but J tube was able to be removed in 08/2020. Patient follows with Dr. Burr Medico in Oncology but is considered to be in remission with no residual carcinoma noted on pathology following chemoradiation and esophagectomy.  Patient presented to the ED via EMS on 11/14/2020 for further evaluation of witnessed syncopal episode. Upon arrival of EMS, BP 70/50. Patient given 500 cc of normal saline with improvement in BP to 90/70. In the ED, BP stable. EKG showed normal sinus rhythm, rate 60 bpm, with new T wave inversion in  inferior leads and slight biphasic T waves in V5-V6. High-sensitivity troponin negative x2. Chest x-ray showed chronic bibasilar scarring and likely chronic left effusion as well as low volume, atelectasis, and prior thoracic duct embolization. WBC 5.8, Hgb 11.7, Plts 199. Na 140, K 3.4, Glucose 79, Creatinine 0.67, BUN 12. Urine drug screen positive for THC. COVID-19 testing negative.  Cardiology consulted for further evaluation. Discussed syncopal episodes. Patient states he was at [employer] drinking at the bar. He had finished 1 beer and was just starting on another when he started feeling hot and flushed and started sweating. He went to sit down in a booth and started to feel better so he got back up to go to the bar and passed out. He was unconscious for about 5-10 seconds. When he came back to, he tried to stand up and then passed out again briefly. He reports feeling lightheadedness prior to this event but denies any chest pain, shortness of breath, or palpitations. He denies any seizure like activity and he had no bowel/bladder incontinence or tongue biting. Patient does state he has had a syncopal episode before about 1 year ago but no other recent syncopal episodes. Otherwise, patient has been in his usual state of health. No orthopnea, PND, or edema. No recent fevers or illnesses. No nasal congestion or cough. No GI symptoms.   Past Medical History:  Diagnosis Date   Anemia    Anemia    Anginal pain ([place])    as a child   Dyspnea    due to weakness  Gout    History of blood transfusion    Malignant neoplasm of lower third of esophagus ([place]) 11/24/2019    Past Surgical History:  Procedure Laterality Date   BIOPSY  11/23/2019   Procedure: BIOPSY;  Surgeon: Arta Silence, MD;  Location: [facility];  Service: Endoscopy;;   ESOPHAGEAL STENT PLACEMENT N/A 04/11/2020   Procedure: ESOPHAGOGASTROSCOPY WITH ESOPHAGEAL STENT PLACEMENT;  Surgeon: Lajuana Matte, MD;  Location: [facility];  Service: Thoracic;  Laterality: N/A;   ESOPHAGEAL STENT PLACEMENT N/A 05/29/2020   Procedure: ESOPHAGEAL STENT REMOVAL;  Surgeon: Lajuana Matte, MD;  Location: [facility];  Service: Thoracic;  Laterality: N/A;   ESOPHAGOGASTRODUODENOSCOPY N/A 02/24/2020   Procedure: ESOPHAGOGASTRODUODENOSCOPY (EGD);  Surgeon: Lajuana Matte, MD;  Location: [facility] OR;  Service: Thoracic;  Laterality: N/A;   ESOPHAGOGASTRODUODENOSCOPY N/A 05/29/2020   Procedure: ESOPHAGOGASTRODUODENOSCOPY (EGD);  Surgeon: Lajuana Matte, MD;  Location: [facility] OR;  Service: Thoracic;  Laterality: N/A;   ESOPHAGOGASTRODUODENOSCOPY (EGD) WITH PROPOFOL N/A 11/23/2019   Procedure: ESOPHAGOGASTRODUODENOSCOPY (EGD) WITH PROPOFOL;  Surgeon: Arta Silence, MD;  Location: [facility];  Service: Endoscopy;  Laterality: N/A;   INTERCOSTAL NERVE BLOCK Right 02/24/2020   Procedure: INTERCOSTAL NERVE BLOCK;  Surgeon: Lajuana Matte, MD;  Location: [facility];  Service: Thoracic;  Laterality: Right;   IR EMBO ART  VEN HEMORR LYMPH EXTRAV  INC GUIDE ROADMAPPING  04/07/2020   IR FLUORO GUIDED NEEDLE PLC ASPIRATION/INJECTION LOC  03/23/2020   IR LYMPHANGIOGRAM PEL/ABD BILAT  03/23/2020   IR LYMPHANGIOGRAM PEL/ABD BILAT  04/07/2020   IR US GUIDANCE  03/23/2020   IR US GUIDANCE  03/23/2020   IR US GUIDE VASC ACCESS LEFT  04/07/2020   IR US GUIDE VASC ACCESS RIGHT  04/07/2020   RADIOLOGY WITH ANESTHESIA N/A 03/23/2020   Procedure: Malignant neoplasm of esophagus;  Surgeon: Sandi Mariscal, MD;  Location: [facility];  Service: Radiology;  Laterality: N/A;   RADIOLOGY WITH ANESTHESIA N/A 04/07/2020   Procedure: IR WITH ANESTHESIA LYMPHANGIOGRAM BILATERAL WITH EMBOLIZATION;  Surgeon: Criselda Peaches, MD;  Location: [facility];  Service: Radiology;  Laterality: N/A;   TONSILLECTOMY     VIDEO ASSISTED THORACOSCOPY (VATS)/EMPYEMA Left 04/11/2020   Procedure: LEFT VIDEO ASSISTED THORACOSCOPY (VATS) WITH IRRIGATION AND CHEST TUBE PLACEMENT;  Surgeon:  Lajuana Matte, MD;  Location: [facility];  Service: Thoracic;  Laterality: Left;     Home Medications:  Prior to Admission medications   Medication Sig Start Date End Date Taking? Authorizing Provider  fluticasone (FLONASE) 50 MCG/ACT nasal spray PLACE 2 SPRAYS INTO BOTH NOSTRILS DAILY. Patient not taking: Reported on 11/15/2020 04/21/20 04/21/21  Barrett, Lodema Hong, PA-C  lactose free nutrition (BOOST PLUS) LIQD Take 237 mLs by mouth 3 (three) times daily with meals. Patient not taking: Reported on 11/15/2020 06/23/20   Jadene Pierini E, PA-C  liver oil-zinc oxide (DESITIN) 40 % ointment Apply topically daily. Patient not taking: Reported on 11/15/2020 06/24/20   John Giovanni, PA-C    Inpatient Medications: Scheduled Meds:  enoxaparin (LOVENOX) injection  40 mg Subcutaneous Q24H   sodium chloride flush  3 mL Intravenous Q12H   Continuous Infusions:  PRN Meds: acetaminophen **OR** acetaminophen, ondansetron **OR** ondansetron (ZOFRAN) IV  Allergies:    Allergies  Allergen Reactions   Bee Venom Swelling   Strawberry Extract Other (See Comments)    Social History:   Social History   Socioeconomic History   Marital status: Legally Separated    Spouse name: Not on file  Number of children: 1   Years of education: Not on file   Highest education level: High school graduate  Occupational History   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  Tobacco Use   Smoking status: Never   Smokeless tobacco: Never  Vaping Use   Vaping Use: Former  Substance and Sexual Activity   Alcohol use: Not Currently   Drug use: Yes    Types: Marijuana    Comment: last time 02/2020   Sexual activity: Yes  Other Topics Concern   Not on file  Social History Narrative   Not on file   Social Determinants of Health   Financial Resource Strain: Low Risk    Difficulty of Paying Living Expenses: Not hard at all  Food Insecurity: No Food Insecurity   Worried About Charity fundraiser in  the Last Year: Never true   [place] in the Last Year: Never true  Transportation Needs: No Transportation Needs   Lack of Transportation (Medical): No   Lack of Transportation (Non-Medical): No  Physical Activity: Not on file  Stress: No Stress Concern Present   Feeling of Stress : Not at all  Social Connections: Moderately Isolated   Frequency of Communication with Friends and Family: More than three times a week   Frequency of Social Gatherings with Friends and Family: More than three times a week   Attends Religious Services: 1 to 4 times per year   Active Member of [employer] or Organizations: No   Attends Archivist Meetings: Never   Marital Status: Separated  Intimate Partner Violence: Not At Risk   Fear of Current or Ex-Partner: No   Emotionally Abused: No   Physically Abused: No   Sexually Abused: No    Family History:    Family History  Problem Relation Age of Onset   Hyperlipidemia Mother    Heart attack Mother    CAD Mother      ROS:  Please see the history of present illness.  Review of Systems  Constitutional:  Positive for diaphoresis. Negative for fever.  HENT:  Negative for congestion.   Eyes:  Negative for blurred vision.  Respiratory:  Negative for shortness of breath.   Cardiovascular:  Negative for chest pain, palpitations, orthopnea, leg swelling and PND.  Gastrointestinal:  Negative for nausea and vomiting.  Musculoskeletal:  Positive for falls. Negative for myalgias.  Neurological:  Positive for loss of consciousness. Dizziness: lightheadedness. Endo/Heme/Allergies:  Does not bruise/bleed easily.  Psychiatric/Behavioral:  Positive for substance abuse (daily marijuana use).     Physical Exam/Data:   Vitals:   11/15/20 0900 11/15/20 0915 11/15/20 0930 11/15/20 0958  BP: 107/61 93/61 107/80 119/80  Pulse: 64 (!) 53 (!) 45 (!) 58  Resp: 19 11 10 16   Temp:    97.9 F (36.6 C)  TempSrc:    Oral  SpO2: 98% 97% 96% 97%  Weight:       Height:       No intake or output data in the 24 hours ending 11/15/20 1101 Last 3 Weights 11/14/2020 08/23/2020 08/17/2020  Weight (lbs) 180 lb 184 lb 3.2 oz 187 lb  Weight (kg) 81.647 kg 83.553 kg 84.823 kg     Body mass index is 25.1 kg/m.  General: 65 y.o. male resting comfortably in no acute distress. HEENT: Normocephalic and atraumatic. Sclera clear.  Neck: Supple. No carotid bruits. No JVD. Heart: Mildly bradycardic with normal rhythm. Distinct S1 and S2. No murmurs, gallops, or  rubs. Radial pulses 2+ and equal bilaterally. Lungs: No increased work of breathing. Clear to ausculation bilaterally. No wheezes, rhonchi, or rales.  Abdomen: Soft, non-distended, and non-tender to palpation. Bowel sounds present. Extremities: No lower extremity edema.    Skin: Warm and dry. Neuro: Alert and oriented x3. No focal deficits. Psych: Normal affect. Responds appropriately.  EKG:  The EKG was personally reviewed and demonstrates: Normal sinus rhythm, rate 60 bpm, with new T wave inversion in inferior leads and slight biphasic T waves in V5-V6. Normal axis. Normal PR and QRS intervals. QTc 436 ms.  Telemetry:  Telemetry was personally reviewed and demonstrates: Sinus bradycardia with rates in the 50s.  Relevant CV Studies: Echo pending.  Laboratory Data:  High Sensitivity Troponin:   Recent Labs  Lab 11/14/20 2231 11/15/20 0124  TROPONINIHS 4 6     Chemistry Recent Labs  Lab 11/14/20 2231 11/15/20 0504  NA 140 137  K 3.4* 3.8  CL 109 109  CO2 23 24  GLUCOSE 79 98  BUN 12 9  CREATININE 0.67 0.58*  CALCIUM 8.7* 8.4*  GFRNONAA >60 >60  ANIONGAP 8 4*    No results for input(s): PROT, ALBUMIN, AST, ALT, ALKPHOS, BILITOT in the last 168 hours. Hematology Recent Labs  Lab 11/14/20 2231 11/15/20 0504  WBC 5.8 4.8  RBC 3.89* 3.57*  HGB 11.7* 10.5*  HCT 36.9* 33.3*  MCV 94.9 93.3  MCH 30.1 29.4  MCHC 31.7 31.5  RDW 15.3 15.1  PLT 199 176   BNP Recent Labs  Lab  11/14/20 2231  BNP 175.7*    DDimer No results for input(s): DDIMER in the last 168 hours.   Radiology/Studies:  DG Chest 2 View  Result Date: 11/14/2020 CLINICAL DATA:  Syncope EXAM: CHEST - 2 VIEW COMPARISON:  Radiograph 08/17/2020, CT 06/19/2020 FINDINGS: Stable positioning of coil pack for prior thoracic duct embolization. Cardiomediastinal contours may be slightly accentuated due to low volumes and portable technique. Contours are otherwise unremarkable. Low volumes and streaky atelectatic changes. Some persistent bandlike scarring is seen in the bilateral lung bases. Suspect some chronic layering left effusion as well. Lungs are otherwise clear. Degenerative changes are present in the imaged spine and shoulders. No acute osseous or chest wall abnormality. Telemetry leads overlie the chest. IMPRESSION: 1. Low volumes and atelectasis. 2. Chronic bibasilar scarring and likely chronic left effusion. 3. Prior thoracic duct embolization. Electronically Signed   By: Lovena Le M.D.   On: 11/14/2020 23:28     Assessment and Plan:   Syncope Abnormal EKG - Patient presented after witnessed syncope episode. BP was 70/50 at time of EMS arrival. - EKG showed new T wave inversion in inferior leads and slight biphasic T waves in V5-V6. High-sensitivity troponin negative x2. Patient denies any chest pain. - Echo pending.  - Sounds like this was due to hypotension. BP improved after IV fluids. Will check orthostatic vital signs. - Telemetry shows mild sinus bradycardia in the 50s but no arrhythmias that would explain syncope.  - Of note, mild atherosclerosis of coronary arteries noted on prior CT in 06/2020. Discussed with MD - will get coronary CTA to rule out significant CAD. - Will also plan on outpatient monitor to rule out arrhythmia.   History of Esophageal Cancer - Management per primary team. - Followed by Dr. Burr Medico as outpatient.  Risk Assessment/Risk Scores:   N/A  For questions or  updates, please contact [facility] Please consult [email or web address] for contact info under  Signed, Darreld Mclean, PA-C  11/15/2020 11:01 AM

## 2020-11-16 ENCOUNTER — Observation Stay (HOSPITAL_COMMUNITY)
Admit: 2020-11-16 | Discharge: 2020-11-16 | Disposition: A | Discharge status: Home / Self Care | Payer: 59 | Attending: Student | Admitting: Student

## 2020-11-16 DIAGNOSIS — I25810 Atherosclerosis of coronary artery bypass graft(s) without angina pectoris: Secondary | ICD-10-CM

## 2020-11-16 DIAGNOSIS — R55 Syncope and collapse: Secondary | ICD-10-CM

## 2020-11-16 LAB — GLUCOSE, CAPILLARY: Glucose-Capillary: 94 mg/dL (ref 70–99)

## 2020-11-16 MED ORDER — ATORVASTATIN CALCIUM 40 MG PO TABS: 40.0000 mg | ORAL_TABLET | Freq: Every day | ORAL | 1 refills | Status: DC

## 2020-11-16 MED ORDER — ATORVASTATIN CALCIUM 40 MG PO TABS
40.0000 mg | ORAL_TABLET | Freq: Every day | ORAL | Status: DC
  Administered 2020-11-16: 40 mg via ORAL
  Filled 2020-11-16: qty 1

## 2020-11-16 MED ORDER — ASPIRIN 81 MG PO CHEW: 81.0000 mg | CHEWABLE_TABLET | Freq: Every day | ORAL | 1 refills | Status: DC

## 2020-11-16 MED ORDER — ASPIRIN 81 MG PO CHEW
81.0000 mg | CHEWABLE_TABLET | Freq: Every day | ORAL | Status: DC
  Administered 2020-11-16: 81 mg via ORAL
  Filled 2020-11-16: qty 1

## 2020-11-16 NOTE — Progress Notes (Signed)
Patient has ordered for discharge. Given discharge instructions with paper to the patient. Iv removed. Given all belongings to the patient. 

## 2020-11-16 NOTE — Progress Notes (Addendum)
Progress Note  Patient Name: Brett Dawson Date of Encounter: 11/16/2020  [facility] Cardiologist: Fransico Him, MD   Subjective   No significant overnight events. Patient states he is feeing good, back to normal. No chest pain, shortness of breath, lightheadedness, or dizziness. He is eager to go home.  Inpatient Medications    Scheduled Meds:  enoxaparin (LOVENOX) injection  40 mg Subcutaneous Q24H   sodium chloride flush  3 mL Intravenous Q12H   Continuous Infusions:  sodium chloride 125 mL/hr at 11/16/20 0713   PRN Meds: acetaminophen **OR** acetaminophen, ondansetron **OR** ondansetron (ZOFRAN) IV   Vital Signs    Vitals:   11/15/20 1829 11/15/20 1931 11/15/20 2338 11/16/20 0444  BP: (!) 141/85 138/80 (!) 139/91 (!) 112/58  Pulse: 69 60 63 62  Resp: 16 14 16 18   Temp: 98.5 F (36.9 C) 98.1 F (36.7 C) 98.6 F (37 C) 98.4 F (36.9 C)  TempSrc: Oral Oral Oral Oral  SpO2: 100% 100% 98% 97%  Weight:    82.6 kg  Height:        Intake/Output Summary (Last 24 hours) at 11/16/2020 1017 Last data filed at 11/16/2020 0300 Gross per 24 hour  Intake 1982.7 ml  Output --  Net 1982.7 ml   Last 3 Weights 11/16/2020 11/14/2020 08/23/2020  Weight (lbs) 182 lb 3.2 oz 180 lb 184 lb 3.2 oz  Weight (kg) 82.645 kg 81.647 kg 83.553 kg     Orthostatic VS for the past 24 hrs:  BP- Lying Pulse- Lying BP- Sitting Pulse- Sitting BP- Standing at 0 minutes Pulse- Standing at 0 minutes  11/16/20 0857 129/78 61 118/82 72 -- --  11/15/20 1213 -- -- -- -- 98/58 90  11/15/20 1211 -- -- 104/69 81 -- --  11/15/20 1210 108/62 77 -- -- -- --    Telemetry    Sinus rhythm with rates in the high 40s to 60s at baseline (as high as the 80s - suspect this is with activity). PVC/ ventricular couplets noted. No pauses. - Personally Reviewed  ECG    No new ECG tracing today. - Personally Reviewed  Physical Exam   GEN: No acute distress.   Neck: No JVD. Cardiac: Mildly bradycardic with  normal rhythm. No murmurs, rubs, or gallops.  Respiratory: Clear to auscultation bilaterally. GI: Soft, non-distended, and non-tender.  MS: No lower extremity edema. No deformity. Skin: Warm and dry. Neuro:  No focal deficits. Psych: Normal affect.  Labs    High Sensitivity Troponin:   Recent Labs  Lab 11/14/20 2231 11/15/20 0124  TROPONINIHS 4 6      Chemistry Recent Labs  Lab 11/14/20 2231 11/15/20 0504  NA 140 137  K 3.4* 3.8  CL 109 109  CO2 23 24  GLUCOSE 79 98  BUN 12 9  CREATININE 0.67 0.58*  CALCIUM 8.7* 8.4*  GFRNONAA >60 >60  ANIONGAP 8 4*     Hematology Recent Labs  Lab 11/14/20 2231 11/15/20 0504  WBC 5.8 4.8  RBC 3.89* 3.57*  HGB 11.7* 10.5*  HCT 36.9* 33.3*  MCV 94.9 93.3  MCH 30.1 29.4  MCHC 31.7 31.5  RDW 15.3 15.1  PLT 199 176    BNP Recent Labs  Lab 11/14/20 2231  BNP 175.7*     DDimer No results for input(s): DDIMER in the last 168 hours.   Radiology    DG Chest 2 View  Result Date: 11/14/2020 CLINICAL DATA:  Syncope EXAM: CHEST - 2 VIEW COMPARISON:  Radiograph 08/17/2020, CT 06/19/2020 FINDINGS: Stable positioning of coil pack for prior thoracic duct embolization. Cardiomediastinal contours may be slightly accentuated due to low volumes and portable technique. Contours are otherwise unremarkable. Low volumes and streaky atelectatic changes. Some persistent bandlike scarring is seen in the bilateral lung bases. Suspect some chronic layering left effusion as well. Lungs are otherwise clear. Degenerative changes are present in the imaged spine and shoulders. No acute osseous or chest wall abnormality. Telemetry leads overlie the chest. IMPRESSION: 1. Low volumes and atelectasis. 2. Chronic bibasilar scarring and likely chronic left effusion. 3. Prior thoracic duct embolization. Electronically Signed   By: Lovena Le M.D.   On: 11/14/2020 23:28   CT CORONARY MORPH W/CTA COR W/SCORE W/CA W/CM &/OR WO/CM  Addendum Date: 11/15/2020    ADDENDUM REPORT: 11/15/2020 19:51 CLINICAL DATA:  Chest pain EXAM: Cardiac/Coronary CTA TECHNIQUE: A non-contrast, gated CT scan was obtained with axial slices of 3 mm through the heart for calcium scoring. Calcium scoring was performed using the Agatston method. A 100 kV prospective, gated, contrast cardiac scan was obtained. Gantry rotation speed was 250 msecs and collimation was 0.6 mm. Two sublingual nitroglycerin tablets (0.8 mg) were given. The 3D data set was reconstructed in 5% intervals of the 35-75% of the R-R cycle. Diastolic phases were analyzed on a dedicated workstation using MPR, MIP, and VRT modes. The patient received 95 cc of contrast. FINDINGS: Image quality: Excellent. Noise artifact is: Limited. Mild mis registration artifact (stair step). Coronary Arteries:  Normal coronary origin.  Right dominance. Left main: The left main is a large caliber vessel with a normal take off from the left coronary cusp that bifurcates to form a left anterior descending artery and a left circumflex artery. There is minimal calcified plaque (<25%) in the distal segment. Left anterior descending artery: There is minimal calcified plaque in the proximal LAD and mid LAD (<25%). The mid LAD contains a myocardial bridge. There is dual LAD system with the second diagonal branch, but this is patent. The distal LAD is patent. The LAD gives off 1 proximal diagonal that is patent (D1). Left circumflex artery: The LCX is non-dominant and there is minimal non-calcified plaque (<25%) in the proximal segment. The LCX gives off 2 patent obtuse marginal branches. Right coronary artery: The RCA is dominant with normal take off from the right coronary cusp. The mid RCA contains minimal calcified plaque (<25%). There is minimal non-calcified plaque in the distal RCA. The RCA terminates as a PDA and right posterolateral branch. The PDA contains mild calcified plaque (25-49%). The PLV appears patent. Without evidence of plaque or  stenosis. Right Atrium: Right atrial size is within normal limits. Right Ventricle: The right ventricular cavity is within normal limits. Left Atrium: Left atrial size is normal in size with no left atrial appendage filling defect. Left Ventricle: The ventricular cavity size is within normal limits. There are no stigmata of prior infarction. There is no abnormal filling defect. Pulmonary arteries: Normal in size without proximal filling defect. Pulmonary veins: Normal pulmonary venous drainage. Pericardium: Normal thickness with no significant effusion or calcium present. Cardiac valves: The aortic valve is trileaflet with trace calcification. The mitral valve is normal structure without significant calcification. Aorta: Normal caliber with no significant disease. Extra-cardiac findings: See attached radiology report for non-cardiac structures. IMPRESSION: 1. Coronary calcium score of 127. This was 62nd percentile for age-, sex, and race-matched controls. 2. Normal coronary origin with right dominance. 3. Minimal calcified plaque in the LAD (<  25%). 4. Minimal non-calcified plaque in the LCX (<25%). 5. Minimal calcified plaque (<25%) in the mid RCA. 6. Mild calcified plaque (25-49%) in the PDA. 7. Mid LAD myocardial bridge (normal variant). 8. Mild stair step artifact noted. RECOMMENDATIONS: 1. Mild non-obstructive CAD (25-49%). Consider non-atherosclerotic causes of chest pain. Consider preventive therapy and risk factor modification. Eleonore Chiquito, MD Electronically Signed   By: Eleonore Chiquito   On: 11/15/2020 19:51   Result Date: 11/15/2020 EXAM: OVER-READ INTERPRETATION  CT CHEST The following report is an over-read performed by radiologist Dr. Zetta Bills of [facility], [place] on 11/15/2020. This over-read does not include interpretation of cardiac or coronary anatomy or pathology. The coronary calcium score/coronary CTA interpretation by the cardiologist is attached. COMPARISON:  Comparison made with  June 19, 2020. FINDINGS: Vascular: Calcified coronary artery disease and calcified aortic atherosclerosis, see dedicated cardiac imaging report, reported separately for further detail. Mediastinum/Nodes: Signs of esophagectomy and gastric pull-through. Mild stranding and loss of normal tissue planes in the mediastinum likely related to postoperative changes and post infectious sequela following drainage of loculated effusion. Lungs/Pleura: Basilar volume loss. Small LEFT-sided pleural effusion without gas or significant loculation diminished in size compared to previous imaging. Post removal of LEFT-sided pleural catheters since more remote imaging. LEFT hemidiaphragm remains elevated adjacent to the pleural fluid. Mild bronchial wall thickening at the lung bases. Mild septal thickening and ground-glass at the lung bases. Upper Abdomen: Incidental imaging of upper abdominal contents with there is limited assessment shows heterogeneity of the spleen likely due to phase of contrast enhancement. No acute upper abdominal findings. Musculoskeletal: No acute musculoskeletal process. No destructive bone finding. IMPRESSION: 1. Signs of esophagectomy and gastric pull-through. 2. Similar size of LEFT pleural effusion compared to May of 2022, diminished compared to more remote studies. 3. Mild septal thickening and ground-glass at the lung bases. Correlate with any risk factors for or history of aspiration. There is mild associated bronchial wall thickening present to variable degrees on prior studies. 4. Calcified coronary artery disease and calcified aortic atherosclerosis. Aortic Atherosclerosis (ICD10-I70.0). Electronically Signed: By: Zetta Bills M.D. On: 11/15/2020 16:41   ECHOCARDIOGRAM COMPLETE  Result Date: 11/15/2020    ECHOCARDIOGRAM REPORT   Patient Name:   Brett Dawson Date of Exam: 11/15/2020 Medical Rec #:  [id number]     Height:       71.0 in Accession #:    [id number]    Weight:       180.0 lb Date  of Birth:  04/14/1956      BSA:          2.016 m Patient Age:    29 years      BP:           107/61 mmHg Patient Gender: M             HR:           51 bpm. Exam Location:  Inpatient Procedure: 2D Echo, Cardiac Doppler and Color Doppler Indications:    R55 Syncope  History:        Patient has no prior history of Echocardiogram examinations.                 Angina; Signs/Symptoms:Dyspnea.  Sonographer:    Jonelle Sidle Dance Referring Phys: 53 JARED M GARDNER  Sonographer Comments: No subcostal window. IMPRESSIONS  1. Left ventricular ejection fraction, by estimation, is 55 to 60%. The left ventricle has normal function. The left ventricle has no regional  wall motion abnormalities. Left ventricular diastolic parameters are consistent with Grade II diastolic dysfunction (pseudonormalization).  2. Peak RV-RA gradient 18 mmHg. Right ventricular systolic function is normal. The right ventricular size is normal.  3. Left atrial size was mildly dilated.  4. Right atrial size was mildly dilated.  5. The mitral valve is normal in structure. Trivial mitral valve regurgitation. No evidence of mitral stenosis.  6. The aortic valve is tricuspid. Aortic valve regurgitation is not visualized. Mild aortic valve sclerosis is present, with no evidence of aortic valve stenosis.  7. Aortic dilatation noted. There is mild dilatation of the ascending aorta, measuring 38 mm. FINDINGS  Left Ventricle: Left ventricular ejection fraction, by estimation, is 55 to 60%. The left ventricle has normal function. The left ventricle has no regional wall motion abnormalities. The left ventricular internal cavity size was normal in size. There is  no left ventricular hypertrophy. Left ventricular diastolic parameters are consistent with Grade II diastolic dysfunction (pseudonormalization). Right Ventricle: Peak RV-RA gradient 18 mmHg. The right ventricular size is normal. No increase in right ventricular wall thickness. Right ventricular systolic function  is normal. Left Atrium: Left atrial size was mildly dilated. Right Atrium: Right atrial size was mildly dilated. Pericardium: There is no evidence of pericardial effusion. Mitral Valve: The mitral valve is normal in structure. There is mild calcification of the mitral valve leaflet(s). Mild mitral annular calcification. Trivial mitral valve regurgitation. No evidence of mitral valve stenosis. Tricuspid Valve: The tricuspid valve is normal in structure. Tricuspid valve regurgitation is trivial. Aortic Valve: The aortic valve is tricuspid. Aortic valve regurgitation is not visualized. Mild aortic valve sclerosis is present, with no evidence of aortic valve stenosis. Pulmonic Valve: The pulmonic valve was normal in structure. Pulmonic valve regurgitation is not visualized. Aorta: The aortic root is normal in size and structure and aortic dilatation noted. There is mild dilatation of the ascending aorta, measuring 38 mm. Venous: The inferior vena cava was not well visualized. IAS/Shunts: No atrial level shunt detected by color flow Doppler.  LEFT VENTRICLE PLAX 2D LVIDd:         5.20 cm  Diastology LVIDs:         3.90 cm  LV e' medial:    10.10 cm/s LV PW:         1.30 cm  LV E/e' medial:  7.1 LV IVS:        1.00 cm  LV e' lateral:   7.61 cm/s LVOT diam:     1.90 cm  LV E/e' lateral: 9.4 LV SV:         75 LV SV Index:   37 LVOT Area:     2.84 cm  RIGHT VENTRICLE RV Basal diam:  3.20 cm RV S prime:     10.50 cm/s TAPSE (M-mode): 2.2 cm LEFT ATRIUM             Index       RIGHT ATRIUM           Index LA diam:        4.20 cm 2.08 cm/m  RA Area:     18.30 cm LA Vol (A2C):   56.1 ml 27.82 ml/m RA Volume:   47.40 ml  23.51 ml/m LA Vol (A4C):   69.6 ml 34.52 ml/m LA Biplane Vol: 63.3 ml 31.40 ml/m  AORTIC VALVE LVOT Vmax:   102.00 cm/s LVOT Vmean:  66.200 cm/s LVOT VTI:    0.264 m  AORTA Ao Root  diam: 2.90 cm Ao Asc diam:  3.80 cm MITRAL VALVE               TRICUSPID VALVE MV Area (PHT): 1.94 cm    TR Peak grad:   17.5  mmHg MV Decel Time: 391 msec    TR Vmax:        209.00 cm/s MV E velocity: 71.40 cm/s MV A velocity: 50.70 cm/s  SHUNTS MV E/A ratio:  1.41        Systemic VTI:  0.26 m                            Systemic Diam: 1.90 cm Loralie Champagne MD Electronically signed by Loralie Champagne MD Signature Date/Time: 11/15/2020/2:41:26 PM    Final     Cardiac Studies   Echocardiogram 11/15/2020: Impressions: 1. Left ventricular ejection fraction, by estimation, is 55 to 60%. The  left ventricle has normal function. The left ventricle has no regional  wall motion abnormalities. Left ventricular diastolic parameters are  consistent with Grade II diastolic  dysfunction (pseudonormalization).   2. Peak RV-RA gradient 18 mmHg. Right ventricular systolic function is  normal. The right ventricular size is normal.   3. Left atrial size was mildly dilated.   4. Right atrial size was mildly dilated.   5. The mitral valve is normal in structure. Trivial mitral valve  regurgitation. No evidence of mitral stenosis.   6. The aortic valve is tricuspid. Aortic valve regurgitation is not  visualized. Mild aortic valve sclerosis is present, with no evidence of  aortic valve stenosis.   7. Aortic dilatation noted. There is mild dilatation of the ascending  aorta, measuring 38 mm.  _______________  Coronary CTA 11/15/2020: Impression: 1. Coronary calcium score of 127. This was 62nd percentile for age-, sex, and race-matched controls. 2. Normal coronary origin with right dominance. 3. Minimal calcified plaque in the LAD (<25%). 4. Minimal non-calcified plaque in the LCX (<25%). 5. Minimal calcified plaque (<25%) in the mid RCA. 6. Mild calcified plaque (25-49%) in the PDA. 7. Mid LAD myocardial bridge (normal variant). 8. Mild stair step artifact noted.   Recommendations: 1. Mild non-obstructive CAD (25-49%). Consider non-atherosclerotic causes of chest pain. Consider preventive therapy and risk  factor modification.   Patient Profile     65 y.o. male with a history of esophageal cancer s/p esophagectomy in 18/2993 complicated by anastomotic leak requiring esophageal stent placement which was able to be removed in 05/2020, anemia, and gout who is being seen or the evaluation of syncope at the request of Dr. Alcario Drought.  Assessment & Plan    Syncope Abnormal EKG - Patient presented after witnessed syncope episode. BP was 70/50 at time of EMS arrival. - EKG showed new T wave inversion in inferior leads and slight biphasic T waves in V5-V6. High-sensitivity troponin negative x2. Patient denies any chest pain. - Echo showed LVEF of 55-60% with normal wall motion and grade 2 diastolic dysfunction. No significant valvular disease. - Coronary CTA showed mild non-obstructive CAD. - Telemetry shows mild sinus bradycardia (as low as the high 40s) but no arrhythmias or concerning pauses that would explain syncope. - Suspect syncopal episode was due to hypotension. BP improved with IV fluids. Orthostatics were negative yesterday. Will plan for heart monitor to rule out any arrhythmia that could have caused this event.   Non-Obstructive CAD - Coronary CTA showed mild non-obstructive CAD (25-49%) and mid LAD myocardial bridging (  normal gradient). - No chest pain.  - Risk factor modification recommended. Cholesterol has not been checked this admission and see nothing in our system but will go ahead and start Lipitor 40mg  daily as well as Aspirin 81mg  daily. Recommended checking lipid panel as outpatient.  Disposition: Patient stable from a cardiac standpoint. Patient should be stable for discharge from our point of few. However, MD to follow with any additional recommendations. Will arrange follow-up in our office.   For questions or updates, please contact [place] Please consult [email or web address] for contact info under      Signed, Darreld Mclean, PA-C  11/16/2020, 10:17 AM     Personally seen and examined. Agree with APP above with the following comments: Briefly 65 yo M with non obstructive CAD with negative troponin and hypotension related syncope Patient notes that he is able to walk around without issues and feels no syncope; BP has recovered. Exam notable for rare irregular heart beat Personally reviewed relevant tests; tele shows nocturnal bradycardia with PVCs Would recommend  - treatment for non-obstructive CAD as above - will send on heart monitor largely for the syncope + PVC burden - reasonable discharge from cardiac persoective  [facility] Will sign off Medication changes as above Working to place heart monitor Working to schedule outpatient appointment.  Rudean Haskell, MD [id number] [place], [place] [address] [phone]  12:43 PM

## 2020-11-16 NOTE — Discharge Summary (Signed)
Physician Discharge Summary  ZAKARY KIMURA GGY:[id number] DOB: 04/27/1956 DOA: 11/14/2020  PCP: [name], MD  Admit date: 11/14/2020 Discharge date: 11/16/2020  Admitted From: Home Disposition:  Home  Discharge Condition:Stable CODE STATUS:FULL Diet recommendation: Heart Healthy    Brief/Interim Summary:  Patient is a 65 year old male with history of esophageal cancer status post radiation, esophagectomy who presents from a restaurant to the emergency department after he had a syncopal episode. EKG showed new T wave inversions in the inferior /lateral leads.  He denied any chest pain.  He was admitted for syncopal work-up Cardiology has been consulted and recommended coronary CT, 30-day event monitoring on discharge. Echocardiogram showed ejection fraction of 55 to 03%, grade 2 diastolic dysfunction.  He was also found to be orthostatic after hospitalization which resolved with IV fluids. He underwent coronary CT which showed calcium score of 127, mild nonobstructive coronary disease.  He has been started on Lipitor and aspirin. Cardiac cleared him for discharge.  He has an appointment with cardiology on 01/12/2021.  We recommend to follow-up with the PCP in a week , and also do a lipid panel test as an outpatient. He is medically stable for discharge home today.   Marland Kitchen Discharge Diagnoses:  Principal Problem:   Syncope Active Problems:   Malignant neoplasm of lower third of esophagus [facility])    Discharge Instructions  Discharge Instructions     Diet - low sodium heart healthy   Complete by: As directed    Discharge instructions   Complete by: As directed    1)Please take prescribed medications as instructed 2)Follow up with your PCP in a week.  Do a lipid panel test during the follow-up 3)You have an appointment with cardiology on 01/12/2021   Increase activity slowly   Complete by: As directed       Allergies as of 11/16/2020       Reactions   Bee Venom Swelling    Strawberry Extract Other (See Comments)        Medication List     TAKE these medications    aspirin 81 MG chewable tablet Chew 1 tablet (81 mg total) by mouth daily. Start taking on: November 17, 2020   atorvastatin 40 MG tablet Commonly known as: LIPITOR Take 1 tablet (40 mg total) by mouth daily. Start taking on: November 17, 2020       ASK your doctor about these medications    fluticasone 50 MCG/ACT nasal spray Commonly known as: FLONASE PLACE 2 SPRAYS INTO BOTH NOSTRILS DAILY.   lactose free nutrition Liqd Take 237 mLs by mouth 3 (three) times daily with meals.   liver oil-zinc oxide 40 % ointment Commonly known as: DESITIN Apply topically daily.        Follow-up Information     Charlie Pitter, PA-C Follow up.   Specialties: Cardiology, Radiology Why: Hospital follow-up with Cardiology scheduled for 01/12/2021 at 2:45pm. Please arrive 15 minutes early for check-in. If this date/time does not work for you, please call our office to reschedule. Contact information: [address] [place] [address] [phone]                Allergies  Allergen Reactions   Bee Venom Swelling   Strawberry Extract Other (See Comments)    Consultations: Cardiology   Procedures/Studies: DG Chest 2 View  Result Date: 11/14/2020 CLINICAL DATA:  Syncope EXAM: CHEST - 2 VIEW COMPARISON:  Radiograph 08/17/2020, CT 06/19/2020 FINDINGS: Stable positioning of coil  pack for prior thoracic duct embolization. Cardiomediastinal contours may be slightly accentuated due to low volumes and portable technique. Contours are otherwise unremarkable. Low volumes and streaky atelectatic changes. Some persistent bandlike scarring is seen in the bilateral lung bases. Suspect some chronic layering left effusion as well. Lungs are otherwise clear. Degenerative changes are present in the imaged spine and shoulders. No acute osseous or chest wall abnormality. Telemetry leads  overlie the chest. IMPRESSION: 1. Low volumes and atelectasis. 2. Chronic bibasilar scarring and likely chronic left effusion. 3. Prior thoracic duct embolization. Electronically Signed   By: Lovena Le M.D.   On: 11/14/2020 23:28   CT CORONARY MORPH W/CTA COR W/SCORE W/CA W/CM &/OR WO/CM  Addendum Date: 11/15/2020   ADDENDUM REPORT: 11/15/2020 19:51 CLINICAL DATA:  Chest pain EXAM: Cardiac/Coronary CTA TECHNIQUE: A non-contrast, gated CT scan was obtained with axial slices of 3 mm through the heart for calcium scoring. Calcium scoring was performed using the Agatston method. A 100 kV prospective, gated, contrast cardiac scan was obtained. Gantry rotation speed was 250 msecs and collimation was 0.6 mm. Two sublingual nitroglycerin tablets (0.8 mg) were given. The 3D data set was reconstructed in 5% intervals of the 35-75% of the R-R cycle. Diastolic phases were analyzed on a dedicated workstation using MPR, MIP, and VRT modes. The patient received 95 cc of contrast. FINDINGS: Image quality: Excellent. Noise artifact is: Limited. Mild mis registration artifact (stair step). Coronary Arteries:  Normal coronary origin.  Right dominance. Left main: The left main is a large caliber vessel with a normal take off from the left coronary cusp that bifurcates to form a left anterior descending artery and a left circumflex artery. There is minimal calcified plaque (<25%) in the distal segment. Left anterior descending artery: There is minimal calcified plaque in the proximal LAD and mid LAD (<25%). The mid LAD contains a myocardial bridge. There is dual LAD system with the second diagonal branch, but this is patent. The distal LAD is patent. The LAD gives off 1 proximal diagonal that is patent (D1). Left circumflex artery: The LCX is non-dominant and there is minimal non-calcified plaque (<25%) in the proximal segment. The LCX gives off 2 patent obtuse marginal branches. Right coronary artery: The RCA is dominant with  normal take off from the right coronary cusp. The mid RCA contains minimal calcified plaque (<25%). There is minimal non-calcified plaque in the distal RCA. The RCA terminates as a PDA and right posterolateral branch. The PDA contains mild calcified plaque (25-49%). The PLV appears patent. Without evidence of plaque or stenosis. Right Atrium: Right atrial size is within normal limits. Right Ventricle: The right ventricular cavity is within normal limits. Left Atrium: Left atrial size is normal in size with no left atrial appendage filling defect. Left Ventricle: The ventricular cavity size is within normal limits. There are no stigmata of prior infarction. There is no abnormal filling defect. Pulmonary arteries: Normal in size without proximal filling defect. Pulmonary veins: Normal pulmonary venous drainage. Pericardium: Normal thickness with no significant effusion or calcium present. Cardiac valves: The aortic valve is trileaflet with trace calcification. The mitral valve is normal structure without significant calcification. Aorta: Normal caliber with no significant disease. Extra-cardiac findings: See attached radiology report for non-cardiac structures. IMPRESSION: 1. Coronary calcium score of 127. This was 62nd percentile for age-, sex, and race-matched controls. 2. Normal coronary origin with right dominance. 3. Minimal calcified plaque in the LAD (<25%). 4. Minimal non-calcified plaque in the LCX (<25%).  5. Minimal calcified plaque (<25%) in the mid RCA. 6. Mild calcified plaque (25-49%) in the PDA. 7. Mid LAD myocardial bridge (normal variant). 8. Mild stair step artifact noted. RECOMMENDATIONS: 1. Mild non-obstructive CAD (25-49%). Consider non-atherosclerotic causes of chest pain. Consider preventive therapy and risk factor modification. Eleonore Chiquito, MD Electronically Signed   By: Eleonore Chiquito   On: 11/15/2020 19:51   Result Date: 11/15/2020 EXAM: OVER-READ INTERPRETATION  CT CHEST The following  report is an over-read performed by radiologist Dr. Zetta Bills of [facility], [place] on 11/15/2020. This over-read does not include interpretation of cardiac or coronary anatomy or pathology. The coronary calcium score/coronary CTA interpretation by the cardiologist is attached. COMPARISON:  Comparison made with June 19, 2020. FINDINGS: Vascular: Calcified coronary artery disease and calcified aortic atherosclerosis, see dedicated cardiac imaging report, reported separately for further detail. Mediastinum/Nodes: Signs of esophagectomy and gastric pull-through. Mild stranding and loss of normal tissue planes in the mediastinum likely related to postoperative changes and post infectious sequela following drainage of loculated effusion. Lungs/Pleura: Basilar volume loss. Small LEFT-sided pleural effusion without gas or significant loculation diminished in size compared to previous imaging. Post removal of LEFT-sided pleural catheters since more remote imaging. LEFT hemidiaphragm remains elevated adjacent to the pleural fluid. Mild bronchial wall thickening at the lung bases. Mild septal thickening and ground-glass at the lung bases. Upper Abdomen: Incidental imaging of upper abdominal contents with there is limited assessment shows heterogeneity of the spleen likely due to phase of contrast enhancement. No acute upper abdominal findings. Musculoskeletal: No acute musculoskeletal process. No destructive bone finding. IMPRESSION: 1. Signs of esophagectomy and gastric pull-through. 2. Similar size of LEFT pleural effusion compared to May of 2022, diminished compared to more remote studies. 3. Mild septal thickening and ground-glass at the lung bases. Correlate with any risk factors for or history of aspiration. There is mild associated bronchial wall thickening present to variable degrees on prior studies. 4. Calcified coronary artery disease and calcified aortic atherosclerosis. Aortic Atherosclerosis  (ICD10-I70.0). Electronically Signed: By: Zetta Bills M.D. On: 11/15/2020 16:41   ECHOCARDIOGRAM COMPLETE  Result Date: 11/15/2020    ECHOCARDIOGRAM REPORT   Patient Name:   Franky TEREZ FREIMARK Date of Exam: 11/15/2020 Medical Rec #:  [id number]     Height:       71.0 in Accession #:    [id number]    Weight:       180.0 lb Date of Birth:  1955/07/13      BSA:          2.016 m Patient Age:    26 years      BP:           107/61 mmHg Patient Gender: M             HR:           51 bpm. Exam Location:  Inpatient Procedure: 2D Echo, Cardiac Doppler and Color Doppler Indications:    R55 Syncope  History:        Patient has no prior history of Echocardiogram examinations.                 Angina; Signs/Symptoms:Dyspnea.  Sonographer:    Jonelle Sidle Dance Referring Phys: 61 JARED M GARDNER  Sonographer Comments: No subcostal window. IMPRESSIONS  1. Left ventricular ejection fraction, by estimation, is 55 to 60%. The left ventricle has normal function. The left ventricle has no regional wall motion abnormalities. Left ventricular diastolic parameters are consistent  with Grade II diastolic dysfunction (pseudonormalization).  2. Peak RV-RA gradient 18 mmHg. Right ventricular systolic function is normal. The right ventricular size is normal.  3. Left atrial size was mildly dilated.  4. Right atrial size was mildly dilated.  5. The mitral valve is normal in structure. Trivial mitral valve regurgitation. No evidence of mitral stenosis.  6. The aortic valve is tricuspid. Aortic valve regurgitation is not visualized. Mild aortic valve sclerosis is present, with no evidence of aortic valve stenosis.  7. Aortic dilatation noted. There is mild dilatation of the ascending aorta, measuring 38 mm. FINDINGS  Left Ventricle: Left ventricular ejection fraction, by estimation, is 55 to 60%. The left ventricle has normal function. The left ventricle has no regional wall motion abnormalities. The left ventricular internal cavity size was normal in  size. There is  no left ventricular hypertrophy. Left ventricular diastolic parameters are consistent with Grade II diastolic dysfunction (pseudonormalization). Right Ventricle: Peak RV-RA gradient 18 mmHg. The right ventricular size is normal. No increase in right ventricular wall thickness. Right ventricular systolic function is normal. Left Atrium: Left atrial size was mildly dilated. Right Atrium: Right atrial size was mildly dilated. Pericardium: There is no evidence of pericardial effusion. Mitral Valve: The mitral valve is normal in structure. There is mild calcification of the mitral valve leaflet(s). Mild mitral annular calcification. Trivial mitral valve regurgitation. No evidence of mitral valve stenosis. Tricuspid Valve: The tricuspid valve is normal in structure. Tricuspid valve regurgitation is trivial. Aortic Valve: The aortic valve is tricuspid. Aortic valve regurgitation is not visualized. Mild aortic valve sclerosis is present, with no evidence of aortic valve stenosis. Pulmonic Valve: The pulmonic valve was normal in structure. Pulmonic valve regurgitation is not visualized. Aorta: The aortic root is normal in size and structure and aortic dilatation noted. There is mild dilatation of the ascending aorta, measuring 38 mm. Venous: The inferior vena cava was not well visualized. IAS/Shunts: No atrial level shunt detected by color flow Doppler.  LEFT VENTRICLE PLAX 2D LVIDd:         5.20 cm  Diastology LVIDs:         3.90 cm  LV e' medial:    10.10 cm/s LV PW:         1.30 cm  LV E/e' medial:  7.1 LV IVS:        1.00 cm  LV e' lateral:   7.61 cm/s LVOT diam:     1.90 cm  LV E/e' lateral: 9.4 LV SV:         75 LV SV Index:   37 LVOT Area:     2.84 cm  RIGHT VENTRICLE RV Basal diam:  3.20 cm RV S prime:     10.50 cm/s TAPSE (M-mode): 2.2 cm LEFT ATRIUM             Index       RIGHT ATRIUM           Index LA diam:        4.20 cm 2.08 cm/m  RA Area:     18.30 cm LA Vol (A2C):   56.1 ml 27.82 ml/m RA  Volume:   47.40 ml  23.51 ml/m LA Vol (A4C):   69.6 ml 34.52 ml/m LA Biplane Vol: 63.3 ml 31.40 ml/m  AORTIC VALVE LVOT Vmax:   102.00 cm/s LVOT Vmean:  66.200 cm/s LVOT VTI:    0.264 m  AORTA Ao Root diam: 2.90 cm Ao Asc diam:  3.80  cm MITRAL VALVE               TRICUSPID VALVE MV Area (PHT): 1.94 cm    TR Peak grad:   17.5 mmHg MV Decel Time: 391 msec    TR Vmax:        209.00 cm/s MV E velocity: 71.40 cm/s MV A velocity: 50.70 cm/s  SHUNTS MV E/A ratio:  1.41        Systemic VTI:  0.26 m                            Systemic Diam: 1.90 cm Loralie Champagne MD Electronically signed by Loralie Champagne MD Signature Date/Time: 11/15/2020/2:41:26 PM    Final       Subjective: Patient seen and examined the bedside this morning.  Medically stable for discharge.  Discharge Exam: Vitals:   11/16/20 0444 11/16/20 1125  BP: (!) 112/58 (!) 132/99  Pulse: 62 (!) 54  Resp: 18 18  Temp: 98.4 F (36.9 C) 98.1 F (36.7 C)  SpO2: 97% 97%   Vitals:   11/15/20 1931 11/15/20 2338 11/16/20 0444 11/16/20 1125  BP: 138/80 (!) 139/91 (!) 112/58 (!) 132/99  Pulse: 60 63 62 (!) 54  Resp: 14 16 18 18   Temp: 98.1 F (36.7 C) 98.6 F (37 C) 98.4 F (36.9 C) 98.1 F (36.7 C)  TempSrc: Oral Oral Oral Oral  SpO2: 100% 98% 97% 97%  Weight:   82.6 kg   Height:        General: Pt is alert, awake, not in acute distress Cardiovascular: RRR, S1/S2 +, no rubs, no gallops Respiratory: CTA bilaterally, no wheezing, no rhonchi Abdominal: Soft, NT, ND, bowel sounds + Extremities: no edema, no cyanosis    The results of significant diagnostics from this hospitalization (including imaging, microbiology, ancillary and laboratory) are listed below for reference.     Microbiology: Recent Results (from the past 240 hour(s))  SARS CORONAVIRUS 2 (TAT 6-24 HRS) Nasopharyngeal Nasopharyngeal Swab     Status: None   Collection Time: 11/15/20  1:20 AM   Specimen: Nasopharyngeal Swab  Result Value Ref Range Status   SARS  Coronavirus 2 NEGATIVE NEGATIVE Final    Comment: (NOTE) SARS-CoV-2 target nucleic acids are NOT DETECTED.  The SARS-CoV-2 RNA is generally detectable in upper and lower respiratory specimens during the acute phase of infection. Negative results do not preclude SARS-CoV-2 infection, do not rule out co-infections with other pathogens, and should not be used as the sole basis for treatment or other patient management decisions. Negative results must be combined with clinical observations, patient history, and epidemiological information. The expected result is Negative.  Fact Sheet for Patients: SugarRoll.be  Fact Sheet for Healthcare Providers: https://www.woods-mathews.com/  This test is not yet approved or cleared by the [place] FDA and  has been authorized for detection and/or diagnosis of SARS-CoV-2 by FDA under an Emergency Use Authorization (EUA). This EUA will remain  in effect (meaning this test can be used) for the duration of the COVID-19 declaration under Se ction 564(b)(1) of the Act, 21 U.S.C. section 360bbb-3(b)(1), unless the authorization is terminated or revoked sooner.  Performed at [facility], [place] [address], [place],  [address]      Labs: BNP (last 3 results) Recent Labs    11/14/20 2231  BNP 981.1*   Basic Metabolic Panel: Recent Labs  Lab 11/14/20 2231 11/15/20 0504  NA 140 137  K 3.4* 3.8  CL 109 109  CO2 23 24  GLUCOSE 79 98  BUN 12 9  CREATININE 0.67 0.58*  CALCIUM 8.7* 8.4*  MG 1.7  --    Liver Function Tests: No results for input(s): AST, ALT, ALKPHOS, BILITOT, PROT, ALBUMIN in the last 168 hours. No results for input(s): LIPASE, AMYLASE in the last 168 hours. No results for input(s): AMMONIA in the last 168 hours. CBC: Recent Labs  Lab 11/14/20 2231 11/15/20 0504  WBC 5.8 4.8  NEUTROABS 4.5  --   HGB 11.7* 10.5*  HCT 36.9* 33.3*  MCV 94.9 93.3  PLT 199 176    Cardiac Enzymes: No results for input(s): CKTOTAL, CKMB, CKMBINDEX, TROPONINI in the last 168 hours. BNP: Invalid input(s): POCBNP CBG: Recent Labs  Lab 11/15/20 0503 11/15/20 0832 11/16/20 0559  GLUCAP 89 98 94   D-Dimer No results for input(s): DDIMER in the last 72 hours. Hgb A1c No results for input(s): HGBA1C in the last 72 hours. Lipid Profile No results for input(s): CHOL, HDL, LDLCALC, TRIG, CHOLHDL, LDLDIRECT in the last 72 hours. Thyroid function studies No results for input(s): TSH, T4TOTAL, T3FREE, THYROIDAB in the last 72 hours.  Invalid input(s): FREET3 Anemia work up No results for input(s): VITAMINB12, FOLATE, FERRITIN, TIBC, IRON, RETICCTPCT in the last 72 hours. Urinalysis    Component Value Date/Time   COLORURINE YELLOW 11/15/2020 0226   APPEARANCEUR HAZY (A) 11/15/2020 0226   LABSPEC 1.016 11/15/2020 0226   PHURINE 5.0 11/15/2020 0226   GLUCOSEU NEGATIVE 11/15/2020 0226   HGBUR NEGATIVE 11/15/2020 0226   BILIRUBINUR NEGATIVE 11/15/2020 0226   BILIRUBINUR negative 05/21/2017 1046   KETONESUR 5 (A) 11/15/2020 0226   PROTEINUR NEGATIVE 11/15/2020 0226   UROBILINOGEN 2.0 (A) 05/21/2017 1046   NITRITE NEGATIVE 11/15/2020 0226   LEUKOCYTESUR NEGATIVE 11/15/2020 0226   Sepsis Labs Invalid input(s): PROCALCITONIN,  WBC,  LACTICIDVEN Microbiology Recent Results (from the past 240 hour(s))  SARS CORONAVIRUS 2 (TAT 6-24 HRS) Nasopharyngeal Nasopharyngeal Swab     Status: None   Collection Time: 11/15/20  1:20 AM   Specimen: Nasopharyngeal Swab  Result Value Ref Range Status   SARS Coronavirus 2 NEGATIVE NEGATIVE Final    Comment: (NOTE) SARS-CoV-2 target nucleic acids are NOT DETECTED.  The SARS-CoV-2 RNA is generally detectable in upper and lower respiratory specimens during the acute phase of infection. Negative results do not preclude SARS-CoV-2 infection, do not rule out co-infections with other pathogens, and should not be used as the sole  basis for treatment or other patient management decisions. Negative results must be combined with clinical observations, patient history, and epidemiological information. The expected result is Negative.  Fact Sheet for Patients: SugarRoll.be  Fact Sheet for Healthcare Providers: https://www.woods-mathews.com/  This test is not yet approved or cleared by the [place] FDA and  has been authorized for detection and/or diagnosis of SARS-CoV-2 by FDA under an Emergency Use Authorization (EUA). This EUA will remain  in effect (meaning this test can be used) for the duration of the COVID-19 declaration under Se ction 564(b)(1) of the Act, 21 U.S.C. section 360bbb-3(b)(1), unless the authorization is terminated or revoked sooner.  Performed at [facility], [place] [address], [place], [place] [address]     Please note: You were cared for by a hospitalist during your hospital stay. Once you are discharged, your primary care physician will handle any further medical issues. Please note that NO REFILLS for any discharge medications will be authorized  once you are discharged, as it is imperative that you return to your primary care physician (or establish a relationship with a primary care physician if you do not have one) for your post hospital discharge needs so that they can reassess your need for medications and monitor your lab values.    Time coordinating discharge: 40 minutes  SIGNED:   Shelly Coss, MD  [facility] 11/16/2020, 1:04 PM Pager [phone]  If 7PM-7AM, please contact night-coverage [email or web address] Password [email or web address]

## 2020-11-22 ENCOUNTER — Other Ambulatory Visit: Payer: 59

## 2020-11-22 ENCOUNTER — Ambulatory Visit: Payer: 59 | Admitting: Hematology

## 2020-11-28 NOTE — Progress Notes (Signed)
[place]   [phone] [phone]   Clinic Follow up Note   Patient Care Team: [name], MD as PCP - General (Family Medicine) Sueanne Margarita, MD as PCP - Cardiology (Cardiology) [name], MD as Consulting Physician (Oncology) Jonnie Finner, RN (Inactive) as Registered Nurse Otis Brace, MD as Consulting Physician (Gastroenterology) Karie Mainland, RD as Dietitian (Nutrition) Lajuana Matte, MD as Consulting Physician (Cardiothoracic Surgery) Arta Silence, MD as Consulting Physician (Gastroenterology)  Date of Service:  11/29/2020  CHIEF COMPLAINT: F/u of Esophageal cancer  SUMMARY OF ONCOLOGIC HISTORY: Oncology History Overview Note  Cancer Staging Malignant neoplasm of lower third of esophagus [facility]) Staging form: Esophagus - Squamous Cell Carcinoma, AJCC 8th Edition - Clinical stage from 11/23/2019: Stage Unknown (cTX, cN1, cM0) - Signed by [name], MD on 11/26/2019     Malignant neoplasm of lower third of esophagus ([place])  09/14/2019 Imaging   CT AP W Contrast 09/14/19 IMPRESSION: 1. Large hiatal hernia. 2. Hepatic and bilateral simple renal cysts. 3. Colonic diverticulosis. 4. Small fat containing left inguinal hernia.   11/10/2019 Imaging   CT Chest 11/10/19  IMPRESSION: 1. There is a large mass of the lower third of the esophagus, with ill-defined margins, measuring approximately 6.0 x 5.4 x 7.7 cm. This appears enlarged and more appreciably masslike than appearance on prior CT dated 09/14/2019. 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 2. There are prominent gastrohepatic ligament lymph nodes adjacent to the lesser curvature, concerning for nodal metastatic disease although not ideally imaged on this examination of the chest. 3. No evidence of metastatic disease within in the chest. 4. Coronary artery disease.   11/22/2019 PET scan   PET 11/22/19 IMPRESSION: 1. Intensely  hypermetabolic (max SUV 0000000) lower thoracic esophageal 6.0 x 5.5 x 9.4 cm mass extending to the esophagogastric junction with probable involvement of the gastric cardia, compatible with primary esophageal malignancy. 2. Hypermetabolic gastrohepatic ligament nodal metastases. 3. No hypermetabolic liver or other distant metastases. 4. Chronic findings include: Aortic Atherosclerosis (ICD10-I70.0). Coronary atherosclerosis. Marked diffuse colonic diverticulosis.   11/23/2019 Procedure   EGD with Upper Endoscopy by Dr [name] 11/23/19 IMPRESSION - Partially obstructing, likely malignant esophageal tumor was found in the lower third of the esophagus. Biopsied. 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 Biopsied. - Normal duodenal bulb, first portion of the duodenum and second portion of the duodenum.   11/23/2019 Initial Biopsy   FINAL MICROSCOPIC DIAGNOSIS:   A. STOMACH, CARDIA, BIOPSY:  - Invasive well-differentiated squamous cell carcinoma.  See comment   B. ESOPHAGUS, DISTAL, BIOPSY:  - Invasive well-differentiated squamous cell carcinoma.  See comment     COMMENT:   A  B.   Dr. Tresa Moore reviewed the case and concurs with the diagnosis.  Dr. [name] was paged on 11/24/2019.    11/23/2019 Cancer Staging   Staging form: Esophagus - Squamous Cell Carcinoma, AJCC 8th Edition - Clinical stage from 11/23/2019: Stage Unknown (cTX, cN1, cM0) - Signed by [name], MD on 11/26/2019    11/24/2019 Initial Diagnosis   Malignant neoplasm of lower third of esophagus ([place])   12/06/2019 - 01/03/2020 Chemotherapy   Concurrent chemoradiation with weekly carboplatin and Taxol starting 12/06/19 - 01/13/20, last chemo 01/03/2020, cycle 6 held due to neutropenia   12/07/2019 - 01/13/2020 Radiation Therapy   Concurrent chemoradiation with Dr Lisbeth Renshaw starting 12/07/19   02/17/2020 Imaging   PET  IMPRESSION: 1. Marked reduction in activity in the distal esophagus and  extending into the stomach, maximum SUV 3.8,  formerly 20.2. There does continue to be circumferential wall thickening in the distal esophagus. 2. Right gastric lymph node is stable in size at 1.1 cm in short axis, but has decreased in activity from previous SUV of 4.8 to current maximum SUV of 2.2. 3. Other imaging findings of potential clinical significance: Aortic Atherosclerosis (ICD10-I70.0). Coronary atherosclerosis. Hepatic and renal cysts. Colonic diverticulosis. Stranding in the central mesentery with some scattered mesenteric lymph nodes favoring sclerosing mesenteritis.   02/24/2020 Pathology Results   XI ROBOTIC ASSISTED IVOR LEWIS ESOPHAGECTOMY   FINAL MICROSCOPIC DIAGNOSIS:   A. LYMPH NODE, CELIAC, EXCISION:  - Focal atypia.  - See comment.   B. LYMPH NODE, LEVEL 4, EXCISION:  - One benign lymph node (0/1).   C. ESOP HAGUS, ESOPHAGECTOMY:  - Ulcer with inflammation, fibrosis and dystrophic calcifications.  - Focal residual keratin with giant cell reaction.  - No residual viable carcinoma identified.  - Margins free of tumor.  - Thirteen benign lymph nodes (0/13).  - See oncology table.   D. ADDITIONAL DISTAL MARGIN, EXCISION:  - Benign stomach.  - No evidence of malignancy.   COMMENT:  A. The celiac lymph node specimen consists of blood and connective  tissue with no lymph node tissue identified.  There are several minute  fragments of epithelioid cells with mild atypia which are not sufficient  for diagnosis of malignancy.   02/24/2020 Cancer Staging   Staging form: Esophagus - Squamous Cell Carcinoma, AJCC 8th Edition - Pathologic stage from 02/24/2020: pT0, pN0, cM0 - Signed by [name], MD on 05/10/2020    03/07/2020 Imaging   CT CAP  IMPRESSION: 1. Previous esophagectomy and pull-through surgery. 2. Large loculated pleural fluid collection posteriorly on the left with air bubbles and air-fluid level consistent with empyema. The left-sided chest tube is not visibly in communication with  the loculated collection. 3. Small amount of pleural fluid on the right with dependent pulmonary atelectasis. 4. 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        CURRENT THERAPY:  Surveillance  INTERVAL HISTORY:  Brett Dawson is here for a follow up of esophageal cancer.  He is doing well overall. Feeding tube was removed about 2 months ago He eats small meals, but appetite is low, lost some weight lately  He has dysphagia sometime with solid food  No pain or heart burns, BM is normal     All other systems were reviewed with the patient and are negative.  MEDICAL HISTORY:  Past Medical History:  Diagnosis Date   Anemia    Anemia    Anginal pain ([place])    as a child   Dyspnea    due to weakness   Gout    History of blood transfusion    Malignant neoplasm of lower third of esophagus ([place]) 11/24/2019    SURGICAL HISTORY: Past Surgical History:  Procedure Laterality Date   BIOPSY  11/23/2019   Procedure: BIOPSY;  Surgeon: Arta Silence, MD;  Location: [facility];  Service: Endoscopy;;   ESOPHAGEAL STENT PLACEMENT N/A 04/11/2020   Procedure: ESOPHAGOGASTROSCOPY WITH ESOPHAGEAL STENT PLACEMENT;  Surgeon: Lajuana Matte, MD;  Location: [facility];  Service: Thoracic;  Laterality: N/A;   ESOPHAGEAL STENT PLACEMENT N/A 05/29/2020   Procedure: ESOPHAGEAL STENT REMOVAL;  Surgeon: Lajuana Matte, MD;  Location: [facility];  Service: Thoracic;  Laterality: N/A;   ESOPHAGOGASTRODUODENOSCOPY N/A 02/24/2020   Procedure: ESOPHAGOGASTRODUODENOSCOPY (  EGD);  Surgeon: Lajuana Matte, MD;  Location: [facility];  Service: Thoracic;  Laterality: N/A;   ESOPHAGOGASTRODUODENOSCOPY N/A 05/29/2020   Procedure: ESOPHAGOGASTRODUODENOSCOPY (EGD);  Surgeon: Lajuana Matte, MD;  Location: [facility] OR;  Service: Thoracic;  Laterality: N/A;   ESOPHAGOGASTRODUODENOSCOPY (EGD) WITH PROPOFOL N/A 11/23/2019   Procedure: ESOPHAGOGASTRODUODENOSCOPY (EGD) WITH  PROPOFOL;  Surgeon: Arta Silence, MD;  Location: [facility];  Service: Endoscopy;  Laterality: N/A;   INTERCOSTAL NERVE BLOCK Right 02/24/2020   Procedure: INTERCOSTAL NERVE BLOCK;  Surgeon: Lajuana Matte, MD;  Location: [place];  Service: Thoracic;  Laterality: Right;   IR EMBO ART  VEN HEMORR LYMPH EXTRAV  INC GUIDE ROADMAPPING  04/07/2020   IR FLUORO GUIDED NEEDLE PLC ASPIRATION/INJECTION LOC  03/23/2020   IR LYMPHANGIOGRAM PEL/ABD BILAT  03/23/2020   IR LYMPHANGIOGRAM PEL/ABD BILAT  04/07/2020   IR US GUIDANCE  03/23/2020   IR US GUIDANCE  03/23/2020   IR US GUIDE VASC ACCESS LEFT  04/07/2020   IR US GUIDE VASC ACCESS RIGHT  04/07/2020   RADIOLOGY WITH ANESTHESIA N/A 03/23/2020   Procedure: Malignant neoplasm of esophagus;  Surgeon: Sandi Mariscal, MD;  Location: [place];  Service: Radiology;  Laterality: N/A;   RADIOLOGY WITH ANESTHESIA N/A 04/07/2020   Procedure: IR WITH ANESTHESIA LYMPHANGIOGRAM BILATERAL WITH EMBOLIZATION;  Surgeon: Criselda Peaches, MD;  Location: [place];  Service: Radiology;  Laterality: N/A;   TONSILLECTOMY     VIDEO ASSISTED THORACOSCOPY (VATS)/EMPYEMA Left 04/11/2020   Procedure: LEFT VIDEO ASSISTED THORACOSCOPY (VATS) WITH IRRIGATION AND CHEST TUBE PLACEMENT;  Surgeon: Lajuana Matte, MD;  Location: [place];  Service: Thoracic;  Laterality: Left;    I have reviewed the social history and family history with the patient and they are unchanged from previous note.  ALLERGIES:  is allergic to bee venom and strawberry extract.  MEDICATIONS:  Current Outpatient Medications  Medication Sig Dispense Refill   aspirin 81 MG chewable tablet Chew 1 tablet (81 mg total) by mouth daily. 30 tablet 1   atorvastatin (LIPITOR) 40 MG tablet Take 1 tablet (40 mg total) by mouth daily. 30 tablet 1   fluticasone (FLONASE) 50 MCG/ACT nasal spray PLACE 2 SPRAYS INTO BOTH NOSTRILS DAILY. (Patient not taking: Reported on 11/15/2020) 16 g 2   lactose free nutrition (BOOST PLUS)  LIQD Take 237 mLs by mouth 3 (three) times daily with meals. (Patient not taking: Reported on 11/15/2020)  0   liver oil-zinc oxide (DESITIN) 40 % ointment Apply topically daily. (Patient not taking: Reported on 11/15/2020) 56.7 g 0   No current facility-administered medications for this visit.    PHYSICAL EXAMINATION: ECOG PERFORMANCE STATUS: 1 - Symptomatic but completely ambulatory  Vitals:   11/29/20 1025  BP: 107/62  Pulse: 73  Resp: 19  Temp: 98.1 F (36.7 C)  SpO2: 98%    Filed Weights   11/29/20 1025  Weight: 177 lb 12.8 oz (80.6 kg)     GENERAL:alert, no distress and comfortable SKIN: skin color, texture, turgor are normal, no rashes or significant lesions EYES: normal, Conjunctiva are pink and non-injected, sclera clear  NECK: supple, thyroid normal size, non-tender, without nodularity LYMPH:  no palpable lymphadenopathy in the cervical, axillary  LUNGS: clear to auscultation and percussion (+) Mild decreased breaths sounds of left lung  HEART: regular rate & rhythm and no murmurs and no lower extremity edema ABDOMEN:abdomen soft, non-tender and normal bowel sounds (+) Surgical incisions healed well  Musculoskeletal:no cyanosis of digits and no  clubbing  NEURO: alert & oriented x 3 with fluent speech, no focal motor/sensory deficits  LABORATORY DATA:  I have reviewed the data as listed CBC Latest Ref Rng & Units 11/29/2020 11/15/2020 11/14/2020  WBC 4.0 - 10.5 K/uL 3.9(L) 4.8 5.8  Hemoglobin 13.0 - 17.0 g/dL 12.3(L) 10.5(L) 11.7(L)  Hematocrit 39.0 - 52.0 % 37.7(L) 33.3(L) 36.9(L)  Platelets 150 - 400 K/uL 164 176 199     CMP Latest Ref Rng & Units 11/29/2020 11/15/2020 11/14/2020  Glucose 70 - 99 mg/dL 125(H) 98 79  BUN 8 - 23 mg/dL '13 9 12  '$ Creatinine 0.61 - 1.24 mg/dL 0.76 0.58(L) 0.67  Sodium 135 - 145 mmol/L 142 137 140  Potassium 3.5 - 5.1 mmol/L 3.9 3.8 3.4(L)  Chloride 98 - 111 mmol/L 106 109 109  CO2 22 - 32 mmol/L '27 24 23  '$ Calcium 8.9 - 10.3 mg/dL  9.5 8.4(L) 8.7(L)  Total Protein 6.5 - 8.1 g/dL 6.8 - -  Total Bilirubin 0.3 - 1.2 mg/dL 0.6 - -  Alkaline Phos 38 - 126 U/L 70 - -  AST 15 - 41 U/L 14(L) - -  ALT 0 - 44 U/L <6 - -      RADIOGRAPHIC STUDIES: I have personally reviewed the radiological images as listed and agreed with the findings in the report. No results found.   ASSESSMENT & PLAN:  SINCEAR TANTON is a 65 y.o. male with    1. Esophageal squamous cell carcinoma, in distal esophagus, cTxN1M0, ypT0N0 -He presented with symptoms in May 2021, but was not diagnosed until 11/23/19 EGD where path showed Invasive well-differentiated squamous cell carcinoma of esophagus which extend to stomach cardia.  -He was treated with concurrent chemoradiation with weekly carboplatin and Taxol 12/06/19-01/13/20, last chemo 01/03/2020, cycle 6 held due to neutropenia. -He underwent esophagectomy on 02/24/20. Path showed no residual carcinoma, 13 LN negative.  He had complete response to neoadjuvant therapy.  This is a excellent prognostic factors, predicts low risk of recurrence, although the risk is not zero. -Given complete response, he does not require adjuvant treatment. He is currently on surveillance.  -I reviewed her non-cardiac findings on her recent cardiac CT, which showed no evidence of cancer recurrence -He is clinically doing well, mild weight loss may be related to feeding tube removal. -Lab reviewed, exam was unremarkable, no clinical concern for recurrence -He will see Dr. Kipp Brood in 2 months, follow-up with me in 3 to 4 months with restaging CT scan   2. Iron deficiency anemia secondary to blood loss -The patient received blood transfusion on 09/27/19 and 1 dose Feraheme on 09/18/19 while in the hospital for symptomatic anemia. Due to persistent low iron and anemia, he received 2 more IV iron doses in 11/2019.  -S/p treatment and surgery, anemia much improved, now mild. He is no longer taking oral iron.     3. Weight loss -He  is s/p feeding tube due to esophageal cancer, dysphagia and complications.    -His current eating is fair. He mostly has maintained his weight. I recommend he increase eating as to work on gaining weight and avoid losing weight.   4. Mild dysphagia -We will refer him back to GI Dr. [name] for EGD, to see if he has esophageal stricture   Plan: -Follow-up in early November with lab and CT chest, abdomen pelvis a few days before -He will see Dr. Kipp Brood in about 2 months -Refer him back to GI Dr. [name] for possible EGD for  dysphagia   No problem-specific Assessment & Plan notes found for this encounter.   Orders Placed This Encounter  Procedures   CT CHEST ABDOMEN PELVIS W CONTRAST    Standing Status:   Future    Standing Expiration Date:   11/29/2021    Order Specific Question:   If indicated for the ordered procedure, I authorize the administration of contrast media per Radiology protocol    Answer:   Yes    Order Specific Question:   Preferred imaging location?    Answer:   [facility]    Order Specific Question:   Release to patient    Answer:   Immediate    Order Specific Question:   Is Oral Contrast requested for this exam?    Answer:   Yes, Per Radiology protocol    Order Specific Question:   Reason for Exam (SYMPTOM  OR DIAGNOSIS REQUIRED)    Answer:   rule out cancer recurrence    All questions were answered. The patient knows to call the clinic with any problems, questions or concerns. No barriers to learning was detected. The total time spent in the appointment was 30 minutes.     [name], MD 11/29/2020

## 2020-11-29 ENCOUNTER — Encounter: Payer: Self-pay | Admitting: Hematology

## 2020-11-29 ENCOUNTER — Inpatient Hospital Stay (HOSPITAL_BASED_OUTPATIENT_CLINIC_OR_DEPARTMENT_OTHER): Payer: 59 | Admitting: Hematology

## 2020-11-29 ENCOUNTER — Other Ambulatory Visit: Payer: Self-pay

## 2020-11-29 ENCOUNTER — Inpatient Hospital Stay: Payer: 59 | Attending: Hematology

## 2020-11-29 VITALS — BP 107/62 | HR 73 | Temp 98.1°F | Resp 19 | Ht 71.0 in | Wt 177.8 lb

## 2020-11-29 DIAGNOSIS — C155 Malignant neoplasm of lower third of esophagus: Secondary | ICD-10-CM | POA: Diagnosis not present

## 2020-11-29 DIAGNOSIS — Z7982 Long term (current) use of aspirin: Secondary | ICD-10-CM | POA: Diagnosis not present

## 2020-11-29 DIAGNOSIS — Z79899 Other long term (current) drug therapy: Secondary | ICD-10-CM | POA: Diagnosis not present

## 2020-11-29 DIAGNOSIS — Z8501 Personal history of malignant neoplasm of esophagus: Secondary | ICD-10-CM | POA: Diagnosis not present

## 2020-11-29 DIAGNOSIS — D508 Other iron deficiency anemias: Secondary | ICD-10-CM

## 2020-11-29 DIAGNOSIS — R1310 Dysphagia, unspecified: Secondary | ICD-10-CM | POA: Diagnosis not present

## 2020-11-29 DIAGNOSIS — D500 Iron deficiency anemia secondary to blood loss (chronic): Secondary | ICD-10-CM | POA: Insufficient documentation

## 2020-11-29 LAB — CMP (CANCER CENTER ONLY)
ALT: <6 U/L (ref 0–44)
AST: 14 U/L — ABNORMAL LOW (ref 15–41)
Albumin: 3.8 g/dL (ref 3.5–5.0)
Alkaline Phosphatase: 70 U/L (ref 38–126)
Anion gap: 9 (ref 5–15)
BUN: 13 mg/dL (ref 8–23)
CO2: 27 mmol/L (ref 22–32)
Calcium: 9.5 mg/dL (ref 8.9–10.3)
Chloride: 106 mmol/L (ref 98–111)
Creatinine: 0.76 mg/dL (ref 0.61–1.24)
GFR, Estimated: >60 mL/min (ref >60)
Glucose, Bld: 125 mg/dL — ABNORMAL HIGH (ref 70–99)
Potassium: 3.9 mmol/L (ref 3.5–5.1)
Sodium: 142 mmol/L (ref 135–145)
Total Bilirubin: 0.6 mg/dL (ref 0.3–1.2)
Total Protein: 6.8 g/dL (ref 6.5–8.1)

## 2020-11-29 LAB — CBC WITH DIFFERENTIAL (CANCER CENTER ONLY)
Abs Immature Granulocytes: 0 10*3/uL (ref 0.00–0.07)
Basophils Absolute: 0 10*3/uL (ref 0.0–0.1)
Basophils Relative: 0 %
Eosinophils Absolute: 0.1 10*3/uL (ref 0.0–0.5)
Eosinophils Relative: 4 %
HCT: 37.7 % — ABNORMAL LOW (ref 39.0–52.0)
Hemoglobin: 12.3 g/dL — ABNORMAL LOW (ref 13.0–17.0)
Immature Granulocytes: 0 %
Lymphocytes Relative: 14 %
Lymphs Abs: 0.6 10*3/uL — ABNORMAL LOW (ref 0.7–4.0)
MCH: 29.7 pg (ref 26.0–34.0)
MCHC: 32.6 g/dL (ref 30.0–36.0)
MCV: 91.1 fL (ref 80.0–100.0)
Monocytes Absolute: 0.3 10*3/uL (ref 0.1–1.0)
Monocytes Relative: 8 %
Neutro Abs: 2.9 10*3/uL (ref 1.7–7.7)
Neutrophils Relative %: 74 %
Platelet Count: 164 10*3/uL (ref 150–400)
RBC: 4.14 MIL/uL — ABNORMAL LOW (ref 4.22–5.81)
RDW: 15.2 % (ref 11.5–15.5)
WBC Count: 3.9 10*3/uL — ABNORMAL LOW (ref 4.0–10.5)
nRBC: 0 % (ref 0.0–0.2)

## 2020-11-29 LAB — FERRITIN: Ferritin: 16 ng/mL — ABNORMAL LOW (ref 24–336)

## 2020-12-04 ENCOUNTER — Other Ambulatory Visit: Payer: Self-pay

## 2020-12-04 NOTE — Progress Notes (Signed)
I faxed Dr Ernestina Penna office note from 11/29/2020 and request for pt to be evaluated by Dr [name], to [facility].

## 2020-12-05 NOTE — Progress Notes (Signed)
Brett Dawson left vm yesterday letting us know that Dr Erlinda Hong office has not contacted him for an appt.  I spoke with Dr Burr Medico she sent Dr [name] a message.  I sent her last ov note with request for evaluation.  This am I left vm with Brett Dawson letting him know this.  I encouraged him to call [facility] for an appt.

## 2020-12-07 ENCOUNTER — Other Ambulatory Visit: Payer: Self-pay | Admitting: Physician Assistant

## 2020-12-07 DIAGNOSIS — R1310 Dysphagia, unspecified: Secondary | ICD-10-CM

## 2020-12-07 DIAGNOSIS — Z8501 Personal history of malignant neoplasm of esophagus: Secondary | ICD-10-CM

## 2020-12-11 NOTE — Addendum Note (Signed)
Encounter addended by: Markus Daft A on: 12/11/2020 4:27 PM  Actions taken: Imaging Exam ended

## 2020-12-12 ENCOUNTER — Ambulatory Visit
Admission: RE | Admit: 2020-12-12 | Discharge: 2020-12-12 | Disposition: A | Discharge status: Home / Self Care | Payer: 59 | Source: Ambulatory Visit | Attending: Physician Assistant | Admitting: Physician Assistant

## 2020-12-12 DIAGNOSIS — R1310 Dysphagia, unspecified: Secondary | ICD-10-CM

## 2020-12-12 DIAGNOSIS — Z8501 Personal history of malignant neoplasm of esophagus: Secondary | ICD-10-CM

## 2020-12-20 ENCOUNTER — Other Ambulatory Visit: Payer: Self-pay

## 2020-12-20 NOTE — Patient Outreach (Signed)
[facility]) Care Management  12/20/2020  Brett Dawson May 18, 1955 [id number]  Telephone outreach to patient to complete [facility] patient satisfaction survey was successfully completed.  [name] [facility] Management Assistant [phone]

## 2021-01-11 ENCOUNTER — Encounter: Payer: Self-pay | Admitting: Physician Assistant

## 2021-01-11 NOTE — Progress Notes (Signed)
Cardiology Office Note    Date:  01/12/2021   ID:  Brett Dawson, DOB 1956/01/27, MRN [id number]  PCP:  [name], MD  Cardiologist:  Fransico Him, MD  Electrophysiologist:  None   Chief Complaint: f/u syncope  History of Present Illness:   Brett Dawson is a 65 y.o. male with history of esophageal cancer s/p esophagectomy in [id number] complicated by anastomotic leak requiring esophageal stent placement which was able to be removed in 05/2020, anemia, gout, nonobstructive CAD, alcohol abuse, recent syncope, sinus bradycardia, PVCs, PACs, PAT who presents for follow-up.   He was admitted 11/2020 with syncope. He was at the bar, had had 3 drinks, was walking back to his bar stool and suddenly passed out for a few seconds. Bystanders told him he woke up then passed on out a second time. He was transported to the hospital. Telemetry during admission showed SB as low as high 40s but no arrhythmias or pauses.  2D echo 11/2020 EF 0000000, grade 2 diastolic dysfunction, RV normal, mild BAE, mild aortic sclerosis without stenosis, mild dilation of ascending aorta (78m). Cor CT 11/2020 showed mild nonobstructive CAD 25-49%, see full report for detail, aorta normal caliber. He was started on statin. Outpatient 14d Monitor that was completed 12/2020 showed predominant NSR 66bpm, range 39->136bpm, 3 runs nonsustained atrial tach (longest run 7 beats), rare PACs, atrial couplets, atrial triplets, rare PVCs and ventricular couplets. He had a low HR of 39bpm at 11/24/20 at 1:03pm. Office followup was suggested.  He is seen back for follow-up today overall doing great without any recurrent issues. Interestingly he reports he had a similar episode of syncope approximately 1 year prior. He had seen primary care at that time and workup was unrevealing although he's not quite sure what it entailed. He does not believe he's had prior brain imaging specifically for syncope. (PET scan 02/2020 does not mention any intracranial  findings.) No CP, SOB, palpitations.   Labwork independently reviewed: 11/2020 Hgb 12.3, K 3.9, Cr 0.76, AST 14, ALT wnl, UDS THC, troponin negative   Past Medical History:  Diagnosis Date   Anemia    Aortic dilatation ([facility]olic    a. mildly dilated by echo but CTA normal 11/2020.   Dyspnea    due to weakness   Gout    History of blood transfusion    Malignant neoplasm of lower third of esophagus (HCC) 11/24/2019   Mild CAD    PAT (paroxysmal atrial tachycardia) (HCC)    Premature atrial contractions    PVCs (premature ventricular contractions)    Sinus bradycardia     Past Surgical History:  Procedure Laterality Date   BIOPSY  11/23/2019   Procedure: BIOPSY;  Surgeon: OArta Silence MD;  Location: [facility];  Service: Endoscopy;;   ESOPHAGEAL STENT PLACEMENT N/A 04/11/2020   Procedure: ESOPHAGOGASTROSCOPY WITH ESOPHAGEAL STENT PLACEMENT;  Surgeon: LLajuana Matte MD;  [facility]  Service: Thoracic;  Laterality: N/A;   ESOPHAGEAL STENT PLACEMENT N/A 05/29/2020   Procedure: ESOPHAGEAL STENT REMOVAL;  Surgeon: LLajuana Matte MD;  [facility];  Service: Thoracic;  Laterality: N/A;   ESOPHAGOGASTRODUODENOSCOPY N/A 02/24/2020   Procedure: ESOPHAGOGASTRODUODENOSCOPY (EGD);  Surgeon: LLajuana Matte MD;  [facility];  Service: Thoracic;  Laterality: N/A;   ESOPHAGOGASTRODUODENOSCOPY N/A 05/29/2020   Procedure: ESOPHAGOGASTRODUODENOSCOPY (EGD);  Surgeon: LLajuana Matte MD;  [facility];  Service: Thoracic;  Laterality: N/A;   ESOPHAGOGASTRODUODENOSCOPY (EGD) WITH PROPOFOL N/A 11/23/2019   Procedure:  ESOPHAGOGASTRODUODENOSCOPY (EGD) WITH PROPOFOL;  Surgeon: Arta Silence, MD;  Location: [facility];  Service: Endoscopy;  Laterality: N/A;   INTERCOSTAL NERVE BLOCK Right 02/24/2020   Procedure: INTERCOSTAL NERVE BLOCK;  Surgeon: Lajuana Matte, MD;  Location: [place];  Service: Thoracic;  Laterality: Right;   IR EMBO ART  VEN HEMORR LYMPH  EXTRAV  INC GUIDE ROADMAPPING  04/07/2020   IR FLUORO GUIDED NEEDLE PLC ASPIRATION/INJECTION LOC  03/23/2020   IR LYMPHANGIOGRAM PEL/ABD BILAT  03/23/2020   IR LYMPHANGIOGRAM PEL/ABD BILAT  04/07/2020   IR US GUIDANCE  03/23/2020   IR US GUIDANCE  03/23/2020   IR US GUIDE VASC ACCESS LEFT  04/07/2020   IR US GUIDE VASC ACCESS RIGHT  04/07/2020   RADIOLOGY WITH ANESTHESIA N/A 03/23/2020   Procedure: Malignant neoplasm of esophagus;  Surgeon: Sandi Mariscal, MD;  Location: [place];  Service: Radiology;  Laterality: N/A;   RADIOLOGY WITH ANESTHESIA N/A 04/07/2020   Procedure: IR WITH ANESTHESIA LYMPHANGIOGRAM BILATERAL WITH EMBOLIZATION;  Surgeon: Criselda Peaches, MD;  Location: [place];  Service: Radiology;  Laterality: N/A;   TONSILLECTOMY     VIDEO ASSISTED THORACOSCOPY (VATS)/EMPYEMA Left 04/11/2020   Procedure: LEFT VIDEO ASSISTED THORACOSCOPY (VATS) WITH IRRIGATION AND CHEST TUBE PLACEMENT;  Surgeon: Lajuana Matte, MD;  [facility];  Service: Thoracic;  Laterality: Left;    Current Medications: Current Meds  Medication Sig   aspirin 81 MG chewable tablet Chew 1 tablet (81 mg total) by mouth daily.   atorvastatin (LIPITOR) 40 MG tablet Take 1 tablet (40 mg total) by mouth daily.   lactose free nutrition (BOOST PLUS) LIQD Take 237 mLs by mouth 3 (three) times daily with meals.      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  Tobacco Use   Smoking status: Never   Smokeless tobacco: Never  Vaping Use   Vaping Use: Former  Substance and Sexual Activity   Alcohol use: Not Currently   Drug use: Yes    Types: Marijuana    Comment: last time 02/2020   Sexual activity: Yes  Other Topics  Concern   Not on file  Social History Narrative   Not on file   Social Determinants of Health   Financial Resource Strain: Not on file  Food Insecurity: No Food Insecurity   Worried About Charity fundraiser in the Last Year: Never true   [place] in the Last Year: Never true  Transportation Needs: No Transportation Needs   Lack of Transportation (Medical): No   Lack of Transportation (Non-Medical): No  Physical Activity: Not on file  Stress: Not on file  Social Connections: Not on file     Family History:  The patient's family history includes CAD in his mother; Heart attack in his mother; Hyperlipidemia in his mother.  ROS:   Please see the history of present illness. + neuropathy in legs. All other systems are reviewed and otherwise negative.    EKGs/Labs/Other Studies Reviewed:    Studies reviewed are outlined and summarized above. Reports included below if pertinent.  2D echo 11/2020   1. Left ventricular ejection fraction, by estimation, is 55 to  60%. The  left ventricle has normal function. The left ventricle has no regional  wall motion abnormalities. Left ventricular diastolic parameters are  consistent with Grade II diastolic  dysfunction (pseudonormalization).   2. Peak RV-RA gradient 18 mmHg. Right ventricular systolic function is  normal. The right ventricular size is normal.   3. Left atrial size was mildly dilated.   4. Right atrial size was mildly dilated.   5. The mitral valve is normal in structure. Trivial mitral valve  regurgitation. No evidence of mitral stenosis.   6. The aortic valve is tricuspid. Aortic valve regurgitation is not  visualized. Mild aortic valve sclerosis is present, with no evidence of  aortic valve stenosis.   7. Aortic dilatation noted. There is mild dilatation of the ascending  aorta, measuring 38 mm.   Monitor resulting 12/2020 Predominant heart rate was normal sinus rhythm with average heart rate 66bpm and ranged  from 39 to 136bpm. Nonsustained atrial tachycardia up to 125bpm and longest run 7 beats. Rare PACs, atrial couplets and atrial triplets. Rrae PVCs and ventricular couplets.     Patch Wear Time:  14 days and 0 hours (2022-07-14T13:28:15-0400 to 2022-07-28T13:28:15-0400)   Patient had a min HR of 39 bpm, max HR of 136 bpm, and avg HR of 66 bpm. Predominant underlying rhythm was Sinus Rhythm. Bundle Branch Block/IVCD was present. 3 Supraventricular Tachycardia runs occurred, the run with the fastest interval lasting 4 beats  with a max rate of 125 bpm, the longest lasting 7 beats with an avg rate of 110 bpm. Isolated SVEs were rare (<1.0%), SVE Couplets were rare (<1.0%), and SVE Triplets were rare (<1.0%). Isolated VEs were rare (<1.0%), VE Couplets were rare (<1.0%), and  no VE Triplets were present. Ventricular Bigeminy and Trigeminy were present  Cor CT 11/2020 FINDINGS: Image quality: Excellent.   Noise artifact is: Limited. Mild mis registration artifact (stair step).   Coronary Arteries:  Normal coronary origin.  Right dominance.   Left main: The left main is a large caliber vessel with a normal take off from the left coronary cusp that bifurcates to form a left anterior descending artery and a left circumflex artery. There is minimal calcified plaque (<25%) in the distal segment.   Left anterior descending artery: There is minimal calcified plaque in the proximal LAD and mid LAD (<25%). The mid LAD contains a myocardial bridge. There is dual LAD system with the second diagonal branch, but this is patent. The distal LAD is patent. The LAD gives off 1 proximal diagonal that is patent (D1).   Left circumflex artery: The LCX is non-dominant and there is minimal non-calcified plaque (<25%) in the proximal segment. The LCX gives off 2 patent obtuse marginal branches.   Right coronary artery: The RCA is dominant with normal take off from the right coronary cusp. The mid RCA contains  minimal calcified plaque (<25%). There is minimal non-calcified plaque in the distal RCA. The RCA terminates as a PDA and right posterolateral branch. The PDA contains mild calcified plaque (25-49%). The PLV appears patent.   Without evidence of plaque or stenosis.   Right Atrium: Right atrial size is within normal limits.   Right Ventricle: The right ventricular cavity is within normal limits.   Left Atrium: Left atrial size is normal in size with no left atrial appendage filling defect.   Left Ventricle: The ventricular cavity size is within normal limits. There are no stigmata of prior infarction. There is no abnormal filling defect.  Pulmonary arteries: Normal in size without proximal filling defect.   Pulmonary veins: Normal pulmonary venous drainage.   Pericardium: Normal thickness with no significant effusion or calcium present.   Cardiac valves: The aortic valve is trileaflet with trace calcification. The mitral valve is normal structure without significant calcification.   Aorta: Normal caliber with no significant disease.   Extra-cardiac findings: See attached radiology report for non-cardiac structures.   IMPRESSION: 1. Coronary calcium score of 127. This was 62nd percentile for age-, sex, and race-matched controls.   2. Normal coronary origin with right dominance.   3. Minimal calcified plaque in the LAD (<25%).   4. Minimal non-calcified plaque in the LCX (<25%).   5. Minimal calcified plaque (<25%) in the mid RCA.   6. Mild calcified plaque (25-49%) in the PDA.   7. Mid LAD myocardial bridge (normal variant).   8. Mild stair step artifact noted.   RECOMMENDATIONS: 1. Mild non-obstructive CAD (25-49%). Consider non-atherosclerotic causes of chest pain. Consider preventive therapy and risk factor modification.   Eleonore Chiquito, MD     Electronically Signed   By: Eleonore Chiquito   On: 11/15/2020 19:51  IMPRESSION: 1. Signs of esophagectomy  and gastric pull-through. 2. Similar size of LEFT pleural effusion compared to May of 2022, diminished compared to more remote studies. 3. Mild septal thickening and ground-glass at the lung bases. Correlate with any risk factors for or history of aspiration. There is mild associated bronchial wall thickening present to variable degrees on prior studies. 4. Calcified coronary artery disease and calcified aortic atherosclerosis.   Aortic Atherosclerosis (ICD10-I70.0).   Electronically Signed: By: Zetta Bills M.D. On: 11/15/2020 16:41    EKG:  EKG is not ordered today.  Recent Labs: 11/14/2020: B Natriuretic Peptide 175.7; Magnesium 1.7 11/29/2020: ALT <6; BUN 13; Creatinine 0.76; Hemoglobin 12.3; Platelet Count 164; Potassium 3.9; Sodium 142  Recent Lipid Panel    Component Value Date/Time   TRIG 44 04/10/2020 0541    PHYSICAL EXAM:    VS:  BP 118/78   Pulse 69   Ht '5\' 11"'$  (1.803 m)   Wt 171 lb 3.2 oz (77.7 kg)   SpO2 97%   BMI 23.88 kg/m   BMI: Body mass index is 23.88 kg/m.  GEN: Well nourished, well developed male in no acute distress HEENT: normocephalic, atraumatic Neck: no JVD, carotid bruits, or masses Cardiac: RRR; no murmurs, rubs, or gallops, no edema  Respiratory:  clear to auscultation bilaterally, normal work of breathing GI: soft, nontender, nondistended, + BS MS: no deformity or atrophy Skin: warm and dry, no rash Neuro:  Alert and Oriented x 3, Strength and sensation are intact, follows commands Psych: euthymic mood, full affect  Wt Readings from Last 3 Encounters:  01/12/21 171 lb 3.2 oz (77.7 kg)  11/29/20 177 lb 12.8 oz (80.6 kg)  11/16/20 182 lb 3.2 oz (82.6 kg)     ASSESSMENT & PLAN:   1. Syncope - his recent episode was actually the second time this has happened. He did not have any symptoms while wearing his event monitor but it did happen to show sinus bradycardia at 39bpm in the middle of the afternoon. He does periodically nap mid  day and does not recall if he was asleep or awake at that time. His other cardiac workup was relatively reassuring. At this juncture, given his history of recurrent syncope, I would recommend referral to EP to review event monitor findings and advise further on possible ILR.  Discussed DMV rec of no driving x 6 months (he reports this is not an issue as he does not have his license). Given bradycardia will also check TSH.  2. Sinus bradycardia - continue to avoid AVN blocking agents - not on any currently. Check thyroid. Refer to EP as above.   3. PACs/PVCs/PAT - asymptomatic. Avoid AVN blocking agents due to baseline sinus bradycardia. Will check thyroid with labs today. In reviewing prior labs, magnesium tended to run low-normal. Will likely advise starting MagOx when we call him with results. We also discussed reduction in ETOH - used to drink more heavily but now drinks 3-4x/week.  4. Mild CAD - started on statin in the hospital. No prior lipids on file. Will check CMET, lipid profile with direct LDL today (ate lunch).  5. Mildly dilated aorta - anticipate yearly surveillance. Will arrange general cardiology in 1 year at which time echocardiogram can be re-ordered if appropriate at that time.  Disposition: Refer to EP as above and f/u Dr. Radford Pax in 1 year. I also advised he have post-hospital f/u with his primary care from recent admission in case they feel he would benefit from brain imaging for history of syncope. There were also nonspecific lung base findings on his CT ("Mild septal thickening and ground-glass at the lung bases. Correlate with any risk factors for or history of aspiration") and I will defer to primary care to weigh in.   Medication Adjustments/Labs and Tests Ordered: Current medicines are reviewed at length with the patient today.  Concerns regarding medicines are outlined above. Medication changes, Labs and Tests ordered today are summarized above and listed in the Patient  Instructions accessible in Encounters.   Signed, Charlie Pitter, PA-C  01/12/2021 3:03 PM    [facility] [address], [place],   [address] Phone: [phone]; Fax: [phone]

## 2021-01-12 ENCOUNTER — Encounter: Payer: Self-pay | Admitting: Physician Assistant

## 2021-01-12 ENCOUNTER — Other Ambulatory Visit: Payer: Self-pay

## 2021-01-12 ENCOUNTER — Ambulatory Visit (INDEPENDENT_AMBULATORY_CARE_PROVIDER_SITE_OTHER): Payer: 59 | Admitting: Physician Assistant

## 2021-01-12 VITALS — BP 118/78 | HR 69 | Ht 71.0 in | Wt 171.2 lb

## 2021-01-12 DIAGNOSIS — I493 Ventricular premature depolarization: Secondary | ICD-10-CM

## 2021-01-12 DIAGNOSIS — R001 Bradycardia, unspecified: Secondary | ICD-10-CM

## 2021-01-12 DIAGNOSIS — R55 Syncope and collapse: Secondary | ICD-10-CM | POA: Diagnosis not present

## 2021-01-12 DIAGNOSIS — I471 Supraventricular tachycardia: Secondary | ICD-10-CM | POA: Diagnosis not present

## 2021-01-12 DIAGNOSIS — I77819 Aortic ectasia, unspecified site: Secondary | ICD-10-CM

## 2021-01-12 DIAGNOSIS — I2510 Atherosclerotic heart disease of native coronary artery without angina pectoris: Secondary | ICD-10-CM

## 2021-01-12 DIAGNOSIS — I491 Atrial premature depolarization: Secondary | ICD-10-CM

## 2021-01-12 MED ORDER — CARVEDILOL 3.125 MG PO TABS: 3.1250 mg | ORAL_TABLET | Freq: Two times a day (BID) | ORAL | 0 refills | Status: DC

## 2021-01-12 NOTE — Patient Instructions (Addendum)
Medication Instructions:  Your physician recommends that you continue on your current medications as directed. Please refer to the Current Medication list given to you today.  *If you need a refill on your cardiac medications before your next appointment, please call your pharmacy*   Lab Work: TODAY:  CMET, LIPID, DIRECT LDL, & TSH  If you have labs (blood work) drawn today and your tests are completely normal, you will receive your results only by: [place] (if you have MyChart) OR A paper copy in the mail If you have any lab test that is abnormal or we need to change your treatment, we will call you to review the results.   Testing/Procedures: None ordered   You have been referred to see a Electrophysiologists for Bradycardia (low heart rate).  Someone will call you with that appointment.    Follow-Up: At [facility], you and your health needs are our priority.  As part of our continuing mission to provide you with exceptional heart care, we have created designated Provider Care Teams.  These Care Teams include your primary Cardiologist (physician) and Advanced Practice Providers (APPs -  Physician Assistants and Nurse Practitioners) who all work together to provide you with the care you need, when you need it.  We recommend signing up for the patient portal called "MyChart".  Sign up information is provided on this After Visit Summary.  MyChart is used to connect with patients for Virtual Visits (Telemedicine).  Patients are able to view lab/test results, encounter notes, upcoming appointments, etc.  Non-urgent messages can be sent to your provider as well.   To learn more about what you can do with MyChart, go to [email or web address].    Your next appointment:   12 month(s)  The format for your next appointment:   In Person  Provider:   You may see Fransico Him, MD or one of the following Advanced Practice Providers on your designated Care Team:   Cecilie Kicks,  NP    Other Instructions

## 2021-01-13 ENCOUNTER — Encounter: Payer: Self-pay | Admitting: Hematology

## 2021-01-13 LAB — COMPREHENSIVE METABOLIC PANEL
ALT: 7 IU/L (ref 0–44)
AST: 16 IU/L (ref 0–40)
Albumin/Globulin Ratio: 2 (ref 1.2–2.2)
Albumin: 4.3 g/dL (ref 3.8–4.8)
Alkaline Phosphatase: 82 IU/L (ref 44–121)
BUN/Creatinine Ratio: 25 — ABNORMAL HIGH (ref 10–24)
BUN: 16 mg/dL (ref 8–27)
Bilirubin Total: 0.5 mg/dL (ref 0.0–1.2)
CO2: 25 mmol/L (ref 20–29)
Calcium: 9.1 mg/dL (ref 8.6–10.2)
Chloride: 102 mmol/L (ref 96–106)
Creatinine, Ser: 0.63 mg/dL — ABNORMAL LOW (ref 0.76–1.27)
Globulin, Total: 2.2 g/dL (ref 1.5–4.5)
Glucose: 122 mg/dL — ABNORMAL HIGH (ref 65–99)
Potassium: 3.7 mmol/L (ref 3.5–5.2)
Sodium: 142 mmol/L (ref 134–144)
Total Protein: 6.5 g/dL (ref 6.0–8.5)
eGFR: 106 mL/min/{1.73_m2} (ref >59)

## 2021-01-13 LAB — LDL CHOLESTEROL, DIRECT: LDL Direct: 111 mg/dL — ABNORMAL HIGH (ref 0–99)

## 2021-01-13 LAB — LIPID PANEL
Chol/HDL Ratio: 3.3 ratio (ref 0.0–5.0)
Cholesterol, Total: 187 mg/dL (ref 100–199)
HDL: 56 mg/dL (ref >39)
LDL Chol Calc (NIH): 116 mg/dL — ABNORMAL HIGH (ref 0–99)
Triglycerides: 79 mg/dL (ref 0–149)
VLDL Cholesterol Cal: 15 mg/dL (ref 5–40)

## 2021-01-13 LAB — TSH: TSH: 1.24 u[IU]/mL (ref 0.450–4.500)

## 2021-01-16 ENCOUNTER — Encounter: Payer: Self-pay | Admitting: *Deleted

## 2021-01-19 ENCOUNTER — Encounter: Payer: Self-pay | Admitting: *Deleted

## 2021-01-25 ENCOUNTER — Encounter: Payer: Self-pay | Admitting: Physician Assistant

## 2021-02-23 ENCOUNTER — Other Ambulatory Visit (HOSPITAL_COMMUNITY): Payer: Self-pay

## 2021-03-09 ENCOUNTER — Other Ambulatory Visit: Payer: Self-pay

## 2021-03-09 DIAGNOSIS — C155 Malignant neoplasm of lower third of esophagus: Secondary | ICD-10-CM

## 2021-03-09 NOTE — Progress Notes (Signed)
Per Dr. Burr Medico, labs needed for Monday are CBC w/Diff, CMP, and CEA

## 2021-03-12 ENCOUNTER — Inpatient Hospital Stay: Payer: 59 | Attending: Hematology

## 2021-03-12 ENCOUNTER — Encounter (HOSPITAL_COMMUNITY): Payer: Self-pay

## 2021-03-12 ENCOUNTER — Ambulatory Visit (HOSPITAL_COMMUNITY)
Admission: RE | Admit: 2021-03-12 | Discharge: 2021-03-12 | Disposition: A | Discharge status: Home / Self Care | Payer: 59 | Source: Ambulatory Visit | Attending: Hematology | Admitting: Hematology

## 2021-03-12 ENCOUNTER — Other Ambulatory Visit: Payer: Self-pay

## 2021-03-12 DIAGNOSIS — C155 Malignant neoplasm of lower third of esophagus: Secondary | ICD-10-CM | POA: Insufficient documentation

## 2021-03-12 LAB — CBC WITH DIFFERENTIAL (CANCER CENTER ONLY)
Abs Immature Granulocytes: 0.01 10*3/uL (ref 0.00–0.07)
Basophils Absolute: 0 10*3/uL (ref 0.0–0.1)
Basophils Relative: 1 %
Eosinophils Absolute: 0.3 10*3/uL (ref 0.0–0.5)
Eosinophils Relative: 6 %
HCT: 36.6 % — ABNORMAL LOW (ref 39.0–52.0)
Hemoglobin: 12 g/dL — ABNORMAL LOW (ref 13.0–17.0)
Immature Granulocytes: 0 %
Lymphocytes Relative: 18 %
Lymphs Abs: 0.7 10*3/uL (ref 0.7–4.0)
MCH: 30.8 pg (ref 26.0–34.0)
MCHC: 32.8 g/dL (ref 30.0–36.0)
MCV: 94.1 fL (ref 80.0–100.0)
Monocytes Absolute: 0.4 10*3/uL (ref 0.1–1.0)
Monocytes Relative: 9 %
Neutro Abs: 2.7 10*3/uL (ref 1.7–7.7)
Neutrophils Relative %: 66 %
Platelet Count: 173 10*3/uL (ref 150–400)
RBC: 3.89 MIL/uL — ABNORMAL LOW (ref 4.22–5.81)
RDW: 14.6 % (ref 11.5–15.5)
WBC Count: 4.1 10*3/uL (ref 4.0–10.5)
nRBC: 0 % (ref 0.0–0.2)

## 2021-03-12 LAB — CMP (CANCER CENTER ONLY)
ALT: 11 U/L (ref 0–44)
AST: 17 U/L (ref 15–41)
Albumin: 3.8 g/dL (ref 3.5–5.0)
Alkaline Phosphatase: 79 U/L (ref 38–126)
Anion gap: 7 (ref 5–15)
BUN: 10 mg/dL (ref 8–23)
CO2: 29 mmol/L (ref 22–32)
Calcium: 8.9 mg/dL (ref 8.9–10.3)
Chloride: 104 mmol/L (ref 98–111)
Creatinine: 0.73 mg/dL (ref 0.61–1.24)
GFR, Estimated: >60 mL/min (ref >60)
Glucose, Bld: 104 mg/dL — ABNORMAL HIGH (ref 70–99)
Potassium: 4 mmol/L (ref 3.5–5.1)
Sodium: 140 mmol/L (ref 135–145)
Total Bilirubin: 0.7 mg/dL (ref 0.3–1.2)
Total Protein: 6.5 g/dL (ref 6.5–8.1)

## 2021-03-12 LAB — CEA (IN HOUSE-CHCC): CEA (CHCC-In House): 2.05 ng/mL (ref 0.00–5.00)

## 2021-03-12 MED ORDER — IOHEXOL 350 MG/ML SOLN
80.0000 mL | Freq: Once | INTRAVENOUS | Status: AC | PRN
  Administered 2021-03-12: 80 mL via INTRAVENOUS

## 2021-03-14 ENCOUNTER — Inpatient Hospital Stay: Payer: 59 | Admitting: Hematology

## 2021-05-03 ENCOUNTER — Encounter: Payer: Self-pay | Admitting: Hematology

## 2021-05-03 ENCOUNTER — Encounter: Payer: Self-pay | Admitting: Physician Assistant

## 2022-01-15 ENCOUNTER — Encounter: Payer: Self-pay | Admitting: Physician Assistant

## 2022-01-15 ENCOUNTER — Inpatient Hospital Stay (HOSPITAL_COMMUNITY)
Admission: EM | Admit: 2022-01-15 | Discharge: 2022-01-18 | DRG: 522 | Disposition: A | Discharge status: Home Health | Payer: Medicare Other | Attending: Family Medicine | Admitting: Family Medicine

## 2022-01-15 ENCOUNTER — Encounter: Payer: Self-pay | Admitting: Hematology

## 2022-01-15 ENCOUNTER — Other Ambulatory Visit: Payer: Self-pay

## 2022-01-15 ENCOUNTER — Emergency Department (HOSPITAL_COMMUNITY): Payer: Medicare Other

## 2022-01-15 DIAGNOSIS — Z8501 Personal history of malignant neoplasm of esophagus: Secondary | ICD-10-CM

## 2022-01-15 DIAGNOSIS — S80211A Abrasion, right knee, initial encounter: Secondary | ICD-10-CM | POA: Diagnosis present

## 2022-01-15 DIAGNOSIS — I2510 Atherosclerotic heart disease of native coronary artery without angina pectoris: Secondary | ICD-10-CM | POA: Diagnosis present

## 2022-01-15 DIAGNOSIS — Z8249 Family history of ischemic heart disease and other diseases of the circulatory system: Secondary | ICD-10-CM

## 2022-01-15 DIAGNOSIS — Z7901 Long term (current) use of anticoagulants: Secondary | ICD-10-CM | POA: Diagnosis not present

## 2022-01-15 DIAGNOSIS — Z79899 Other long term (current) drug therapy: Secondary | ICD-10-CM

## 2022-01-15 DIAGNOSIS — Z23 Encounter for immunization: Secondary | ICD-10-CM | POA: Diagnosis present

## 2022-01-15 DIAGNOSIS — Y92009 Unspecified place in unspecified non-institutional (private) residence as the place of occurrence of the external cause: Secondary | ICD-10-CM | POA: Diagnosis not present

## 2022-01-15 DIAGNOSIS — K219 Gastro-esophageal reflux disease without esophagitis: Secondary | ICD-10-CM | POA: Diagnosis present

## 2022-01-15 DIAGNOSIS — Z7982 Long term (current) use of aspirin: Secondary | ICD-10-CM

## 2022-01-15 DIAGNOSIS — Y92488 Other paved roadways as the place of occurrence of the external cause: Secondary | ICD-10-CM

## 2022-01-15 DIAGNOSIS — S72011A Unspecified intracapsular fracture of right femur, initial encounter for closed fracture: Principal | ICD-10-CM | POA: Diagnosis present

## 2022-01-15 DIAGNOSIS — W19XXXA Unspecified fall, initial encounter: Secondary | ICD-10-CM | POA: Diagnosis not present

## 2022-01-15 DIAGNOSIS — M25551 Pain in right hip: Secondary | ICD-10-CM | POA: Diagnosis not present

## 2022-01-15 DIAGNOSIS — M1990 Unspecified osteoarthritis, unspecified site: Secondary | ICD-10-CM | POA: Diagnosis not present

## 2022-01-15 DIAGNOSIS — Z91030 Bee allergy status: Secondary | ICD-10-CM | POA: Diagnosis not present

## 2022-01-15 DIAGNOSIS — E785 Hyperlipidemia, unspecified: Secondary | ICD-10-CM | POA: Diagnosis present

## 2022-01-15 DIAGNOSIS — Z87891 Personal history of nicotine dependence: Secondary | ICD-10-CM

## 2022-01-15 DIAGNOSIS — D638 Anemia in other chronic diseases classified elsewhere: Secondary | ICD-10-CM | POA: Diagnosis present

## 2022-01-15 DIAGNOSIS — S72001A Fracture of unspecified part of neck of right femur, initial encounter for closed fracture: Secondary | ICD-10-CM

## 2022-01-15 DIAGNOSIS — D649 Anemia, unspecified: Secondary | ICD-10-CM | POA: Diagnosis not present

## 2022-01-15 DIAGNOSIS — M109 Gout, unspecified: Secondary | ICD-10-CM | POA: Diagnosis present

## 2022-01-15 LAB — CBC WITH DIFFERENTIAL/PLATELET
Abs Immature Granulocytes: 0.03 10*3/uL (ref 0.00–0.07)
Basophils Absolute: 0 10*3/uL (ref 0.0–0.1)
Basophils Relative: 0 %
Eosinophils Absolute: 0.1 10*3/uL (ref 0.0–0.5)
Eosinophils Relative: 2 %
HCT: 35.4 % — ABNORMAL LOW (ref 39.0–52.0)
Hemoglobin: 11.7 g/dL — ABNORMAL LOW (ref 13.0–17.0)
Immature Granulocytes: 1 %
Lymphocytes Relative: 10 %
Lymphs Abs: 0.6 10*3/uL — ABNORMAL LOW (ref 0.7–4.0)
MCH: 31.5 pg (ref 26.0–34.0)
MCHC: 33.1 g/dL (ref 30.0–36.0)
MCV: 95.2 fL (ref 80.0–100.0)
Monocytes Absolute: 0.5 10*3/uL (ref 0.1–1.0)
Monocytes Relative: 9 %
Neutro Abs: 4.8 10*3/uL (ref 1.7–7.7)
Neutrophils Relative %: 78 %
Platelets: 180 10*3/uL (ref 150–400)
RBC: 3.72 MIL/uL — ABNORMAL LOW (ref 4.22–5.81)
RDW: 14.4 % (ref 11.5–15.5)
WBC: 6.1 10*3/uL (ref 4.0–10.5)
nRBC: 0 % (ref 0.0–0.2)

## 2022-01-15 LAB — COMPREHENSIVE METABOLIC PANEL
ALT: 13 U/L (ref 0–44)
AST: 23 U/L (ref 15–41)
Albumin: 4 g/dL (ref 3.5–5.0)
Alkaline Phosphatase: 55 U/L (ref 38–126)
Anion gap: 13 (ref 5–15)
BUN: 12 mg/dL (ref 8–23)
CO2: 22 mmol/L (ref 22–32)
Calcium: 9 mg/dL (ref 8.9–10.3)
Chloride: 104 mmol/L (ref 98–111)
Creatinine, Ser: 0.79 mg/dL (ref 0.61–1.24)
GFR, Estimated: >60 mL/min (ref >60)
Glucose, Bld: 96 mg/dL (ref 70–99)
Potassium: 3.7 mmol/L (ref 3.5–5.1)
Sodium: 139 mmol/L (ref 135–145)
Total Bilirubin: 0.6 mg/dL (ref 0.3–1.2)
Total Protein: 6.4 g/dL — ABNORMAL LOW (ref 6.5–8.1)

## 2022-01-15 MED ORDER — NALOXONE HCL 0.4 MG/ML IJ SOLN: 0.4000 mg | INTRAMUSCULAR | Status: DC | PRN

## 2022-01-15 MED ORDER — MORPHINE SULFATE (PF) 4 MG/ML IV SOLN
4.0000 mg | INTRAVENOUS | Status: DC | PRN
  Administered 2022-01-16: 4 mg via INTRAVENOUS
  Filled 2022-01-15: qty 1

## 2022-01-15 MED ORDER — MORPHINE SULFATE (PF) 4 MG/ML IV SOLN
4.0000 mg | Freq: Once | INTRAVENOUS | Status: AC
  Administered 2022-01-15: 4 mg via INTRAVENOUS
  Filled 2022-01-15: qty 1

## 2022-01-15 MED ORDER — TETANUS-DIPHTH-ACELL PERTUSSIS 5-2.5-18.5 LF-MCG/0.5 IM SUSY
0.5000 mL | PREFILLED_SYRINGE | Freq: Once | INTRAMUSCULAR | Status: AC
  Administered 2022-01-15: 0.5 mL via INTRAMUSCULAR

## 2022-01-15 MED ORDER — ONDANSETRON HCL 4 MG/2ML IJ SOLN
4.0000 mg | Freq: Four times a day (QID) | INTRAMUSCULAR | Status: DC | PRN
  Administered 2022-01-15: 4 mg via INTRAVENOUS
  Filled 2022-01-15: qty 2

## 2022-01-15 MED ORDER — ACETAMINOPHEN 325 MG PO TABS
650.0000 mg | ORAL_TABLET | Freq: Four times a day (QID) | ORAL | Status: DC | PRN
  Administered 2022-01-16: 650 mg via ORAL
  Filled 2022-01-15: qty 2

## 2022-01-15 MED ORDER — ACETAMINOPHEN 650 MG RE SUPP: 650.0000 mg | Freq: Four times a day (QID) | RECTAL | Status: DC | PRN
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
## 2022-01-15 NOTE — ED Notes (Signed)
Bedside xrays done

## 2022-01-16 ENCOUNTER — Inpatient Hospital Stay (HOSPITAL_COMMUNITY): Payer: Medicare Other

## 2022-01-16 ENCOUNTER — Encounter (HOSPITAL_COMMUNITY): Payer: Self-pay | Admitting: Internal Medicine

## 2022-01-16 ENCOUNTER — Other Ambulatory Visit: Payer: Self-pay

## 2022-01-16 ENCOUNTER — Encounter: Payer: Self-pay | Admitting: Hematology

## 2022-01-16 ENCOUNTER — Encounter: Payer: Self-pay | Admitting: Physician Assistant

## 2022-01-16 ENCOUNTER — Inpatient Hospital Stay (HOSPITAL_COMMUNITY): Payer: Medicare Other | Admitting: Certified Registered Nurse Anesthetist

## 2022-01-16 ENCOUNTER — Encounter (HOSPITAL_COMMUNITY)
Admission: EM | Disposition: A | Discharge status: Home Health | Payer: Self-pay | Source: Home / Self Care | Attending: Family Medicine

## 2022-01-16 DIAGNOSIS — E785 Hyperlipidemia, unspecified: Secondary | ICD-10-CM | POA: Diagnosis present

## 2022-01-16 DIAGNOSIS — I2510 Atherosclerotic heart disease of native coronary artery without angina pectoris: Secondary | ICD-10-CM | POA: Diagnosis not present

## 2022-01-16 DIAGNOSIS — S72001A Fracture of unspecified part of neck of right femur, initial encounter for closed fracture: Secondary | ICD-10-CM | POA: Diagnosis not present

## 2022-01-16 DIAGNOSIS — M1990 Unspecified osteoarthritis, unspecified site: Secondary | ICD-10-CM

## 2022-01-16 DIAGNOSIS — D649 Anemia, unspecified: Secondary | ICD-10-CM | POA: Diagnosis not present

## 2022-01-16 DIAGNOSIS — D638 Anemia in other chronic diseases classified elsewhere: Secondary | ICD-10-CM | POA: Diagnosis present

## 2022-01-16 DIAGNOSIS — M25551 Pain in right hip: Secondary | ICD-10-CM | POA: Diagnosis present

## 2022-01-16 DIAGNOSIS — W19XXXA Unspecified fall, initial encounter: Secondary | ICD-10-CM

## 2022-01-16 HISTORY — PX: TOTAL HIP ARTHROPLASTY: SHX124

## 2022-01-16 LAB — CBC WITH DIFFERENTIAL/PLATELET
Abs Immature Granulocytes: 0.03 10*3/uL (ref 0.00–0.07)
Basophils Absolute: 0 10*3/uL (ref 0.0–0.1)
Basophils Relative: 0 %
Eosinophils Absolute: 0 10*3/uL (ref 0.0–0.5)
Eosinophils Relative: 0 %
HCT: 32.3 % — ABNORMAL LOW (ref 39.0–52.0)
Hemoglobin: 11.2 g/dL — ABNORMAL LOW (ref 13.0–17.0)
Immature Granulocytes: 0 %
Lymphocytes Relative: 6 %
Lymphs Abs: 0.4 10*3/uL — ABNORMAL LOW (ref 0.7–4.0)
MCH: 31.8 pg (ref 26.0–34.0)
MCHC: 34.7 g/dL (ref 30.0–36.0)
MCV: 91.8 fL (ref 80.0–100.0)
Monocytes Absolute: 0.6 10*3/uL (ref 0.1–1.0)
Monocytes Relative: 7 %
Neutro Abs: 6.7 10*3/uL (ref 1.7–7.7)
Neutrophils Relative %: 87 %
Platelets: 181 10*3/uL (ref 150–400)
RBC: 3.52 MIL/uL — ABNORMAL LOW (ref 4.22–5.81)
RDW: 14.5 % (ref 11.5–15.5)
WBC: 7.8 10*3/uL (ref 4.0–10.5)
nRBC: 0 % (ref 0.0–0.2)

## 2022-01-16 LAB — PROTIME-INR
INR: 1.2 (ref 0.8–1.2)
Prothrombin Time: 15 seconds (ref 11.4–15.2)

## 2022-01-16 LAB — COMPREHENSIVE METABOLIC PANEL
ALT: 14 U/L (ref 0–44)
AST: 22 U/L (ref 15–41)
Albumin: 3.6 g/dL (ref 3.5–5.0)
Alkaline Phosphatase: 52 U/L (ref 38–126)
Anion gap: 12 (ref 5–15)
BUN: 11 mg/dL (ref 8–23)
CO2: 23 mmol/L (ref 22–32)
Calcium: 8.5 mg/dL — ABNORMAL LOW (ref 8.9–10.3)
Chloride: 102 mmol/L (ref 98–111)
Creatinine, Ser: 0.66 mg/dL (ref 0.61–1.24)
GFR, Estimated: >60 mL/min (ref >60)
Glucose, Bld: 107 mg/dL — ABNORMAL HIGH (ref 70–99)
Potassium: 3.9 mmol/L (ref 3.5–5.1)
Sodium: 137 mmol/L (ref 135–145)
Total Bilirubin: 1.1 mg/dL (ref 0.3–1.2)
Total Protein: 5.8 g/dL — ABNORMAL LOW (ref 6.5–8.1)

## 2022-01-16 LAB — TYPE AND SCREEN
ABO/RH(D): B NEG
Antibody Screen: NEGATIVE

## 2022-01-16 LAB — SURGICAL PCR SCREEN
MRSA, PCR: NEGATIVE
Staphylococcus aureus: NEGATIVE

## 2022-01-16 LAB — MAGNESIUM
Magnesium: 1.8 mg/dL (ref 1.7–2.4)
Magnesium: 1.6 mg/dL — ABNORMAL LOW (ref 1.7–2.4)

## 2022-01-16 LAB — VITAMIN D 25 HYDROXY (VIT D DEFICIENCY, FRACTURES): Vit D, 25-Hydroxy: 15.53 ng/mL — ABNORMAL LOW (ref 30–100)

## 2022-01-16 SURGERY — ARTHROPLASTY, HIP, TOTAL, ANTERIOR APPROACH
Anesthesia: General | Site: Hip | Laterality: Right

## 2022-01-16 MED ORDER — ONDANSETRON HCL 4 MG/2ML IJ SOLN
INTRAMUSCULAR | Status: DC | PRN
  Administered 2022-01-16: 4 mg via INTRAVENOUS

## 2022-01-16 MED ORDER — 0.9 % SODIUM CHLORIDE (POUR BTL) OPTIME
TOPICAL | Status: DC | PRN
  Administered 2022-01-16: 1000 mL

## 2022-01-16 MED ORDER — FENTANYL CITRATE (PF) 100 MCG/2ML IJ SOLN: 25.0000 ug | INTRAMUSCULAR | Status: DC | PRN

## 2022-01-16 MED ORDER — SUGAMMADEX SODIUM 200 MG/2ML IV SOLN
INTRAVENOUS | Status: DC | PRN
  Administered 2022-01-16: 200 mg via INTRAVENOUS

## 2022-01-16 MED ORDER — ONDANSETRON HCL 4 MG PO TABS: 4.0000 mg | ORAL_TABLET | Freq: Four times a day (QID) | ORAL | Status: DC | PRN

## 2022-01-16 MED ORDER — MIDAZOLAM HCL 2 MG/2ML IJ SOLN
INTRAMUSCULAR | Status: DC | PRN
  Administered 2022-01-16: 2 mg via INTRAVENOUS

## 2022-01-16 MED ORDER — MORPHINE SULFATE (PF) 2 MG/ML IV SOLN: 0.5000 mg | INTRAVENOUS | Status: DC | PRN

## 2022-01-16 MED ORDER — POVIDONE-IODINE 10 % EX SWAB: 2.0000 | Freq: Once | CUTANEOUS | Status: DC

## 2022-01-16 MED ORDER — CHLORHEXIDINE GLUCONATE 0.12 % MT SOLN: 15.0000 mL | Freq: Once | OROMUCOSAL | Status: AC

## 2022-01-16 MED ORDER — HYDROCODONE-ACETAMINOPHEN 7.5-325 MG PO TABS: 1.0000 | ORAL_TABLET | ORAL | Status: DC | PRN

## 2022-01-16 MED ORDER — DIPHENHYDRAMINE HCL 12.5 MG/5ML PO ELIX: 12.5000 mg | ORAL_SOLUTION | ORAL | Status: DC | PRN

## 2022-01-16 MED ORDER — OXYCODONE HCL 5 MG/5ML PO SOLN: 5.0000 mg | Freq: Once | ORAL | Status: DC | PRN

## 2022-01-16 MED ORDER — TRANEXAMIC ACID-NACL 1000-0.7 MG/100ML-% IV SOLN
1000.0000 mg | INTRAVENOUS | Status: AC
  Administered 2022-01-16: 1000 mg via INTRAVENOUS
  Filled 2022-01-16: qty 100

## 2022-01-16 MED ORDER — KETOROLAC TROMETHAMINE 30 MG/ML IJ SOLN
INTRAMUSCULAR | Status: AC
  Filled 2022-01-16: qty 1

## 2022-01-16 MED ORDER — IRRISEPT - 450ML BOTTLE WITH 0.05% CHG IN STERILE WATER, USP 99.95% OPTIME
TOPICAL | Status: DC | PRN
  Administered 2022-01-16: 450 mL

## 2022-01-16 MED ORDER — CHLORHEXIDINE GLUCONATE 4 % EX LIQD: 60.0000 mL | Freq: Once | CUTANEOUS | Status: DC

## 2022-01-16 MED ORDER — PROPOFOL 10 MG/ML IV BOLUS
INTRAVENOUS | Status: AC
  Filled 2022-01-16: qty 20

## 2022-01-16 MED ORDER — SODIUM CHLORIDE 0.9 % IV SOLN: INTRAVENOUS | Status: DC

## 2022-01-16 MED ORDER — MAGNESIUM SULFATE 2 GM/50ML IV SOLN
2.0000 g | Freq: Once | INTRAVENOUS | Status: AC
  Administered 2022-01-16: 2 g via INTRAVENOUS
  Filled 2022-01-16: qty 50

## 2022-01-16 MED ORDER — FENTANYL CITRATE (PF) 250 MCG/5ML IJ SOLN
INTRAMUSCULAR | Status: AC
  Filled 2022-01-16: qty 5

## 2022-01-16 MED ORDER — POVIDONE-IODINE 10 % EX SWAB
2.0000 | Freq: Once | CUTANEOUS | Status: AC
  Administered 2022-01-16: 2 via TOPICAL

## 2022-01-16 MED ORDER — ONDANSETRON HCL 4 MG/2ML IJ SOLN: 4.0000 mg | Freq: Four times a day (QID) | INTRAMUSCULAR | Status: DC | PRN

## 2022-01-16 MED ORDER — METHOCARBAMOL 1000 MG/10ML IJ SOLN: 500.0000 mg | Freq: Four times a day (QID) | INTRAVENOUS | Status: DC | PRN

## 2022-01-16 MED ORDER — ACETAMINOPHEN 325 MG PO TABS
325.0000 mg | ORAL_TABLET | Freq: Four times a day (QID) | ORAL | Status: DC | PRN
  Filled 2022-01-16: qty 1

## 2022-01-16 MED ORDER — SODIUM CHLORIDE (PF) 0.9 % IJ SOLN
INTRAMUSCULAR | Status: DC | PRN
  Administered 2022-01-16: 60 mL

## 2022-01-16 MED ORDER — MENTHOL 3 MG MT LOZG: 1.0000 | LOZENGE | OROMUCOSAL | Status: DC | PRN

## 2022-01-16 MED ORDER — DEXAMETHASONE SODIUM PHOSPHATE 10 MG/ML IJ SOLN
INTRAMUSCULAR | Status: DC | PRN
  Administered 2022-01-16: 5 mg via INTRAVENOUS

## 2022-01-16 MED ORDER — ROCURONIUM BROMIDE 10 MG/ML (PF) SYRINGE
PREFILLED_SYRINGE | INTRAVENOUS | Status: DC | PRN
  Administered 2022-01-16: 40 mg via INTRAVENOUS
  Administered 2022-01-16: 60 mg via INTRAVENOUS

## 2022-01-16 MED ORDER — PROPOFOL 10 MG/ML IV BOLUS
INTRAVENOUS | Status: DC | PRN
  Administered 2022-01-16: 200 mg via INTRAVENOUS

## 2022-01-16 MED ORDER — SENNA 8.6 MG PO TABS
1.0000 | ORAL_TABLET | Freq: Two times a day (BID) | ORAL | Status: DC
  Administered 2022-01-18: 8.6 mg via ORAL
  Filled 2022-01-16 (×3): qty 1

## 2022-01-16 MED ORDER — SODIUM CHLORIDE 0.9 % IR SOLN
Status: DC | PRN
  Administered 2022-01-16: 250 mL

## 2022-01-16 MED ORDER — METOCLOPRAMIDE HCL 5 MG PO TABS: 5.0000 mg | ORAL_TABLET | Freq: Three times a day (TID) | ORAL | Status: DC | PRN

## 2022-01-16 MED ORDER — METOCLOPRAMIDE HCL 5 MG/ML IJ SOLN: 5.0000 mg | Freq: Three times a day (TID) | INTRAMUSCULAR | Status: DC | PRN

## 2022-01-16 MED ORDER — ALUM & MAG HYDROXIDE-SIMETH 200-200-20 MG/5ML PO SUSP: 30.0000 mL | ORAL | Status: DC | PRN

## 2022-01-16 MED ORDER — CEFAZOLIN SODIUM-DEXTROSE 2-4 GM/100ML-% IV SOLN
2.0000 g | Freq: Four times a day (QID) | INTRAVENOUS | Status: AC
  Administered 2022-01-16 – 2022-01-17 (×2): 2 g via INTRAVENOUS
  Filled 2022-01-16 (×2): qty 100

## 2022-01-16 MED ORDER — PHENOL 1.4 % MT LIQD: 1.0000 | OROMUCOSAL | Status: DC | PRN

## 2022-01-16 MED ORDER — ORAL CARE MOUTH RINSE: 15.0000 mL | Freq: Once | OROMUCOSAL | Status: AC

## 2022-01-16 MED ORDER — POLYETHYLENE GLYCOL 3350 17 G PO PACK: 17.0000 g | PACK | Freq: Every day | ORAL | Status: DC | PRN

## 2022-01-16 MED ORDER — HYDROCODONE-ACETAMINOPHEN 5-325 MG PO TABS
1.0000 | ORAL_TABLET | ORAL | Status: DC | PRN
  Administered 2022-01-18: 1 via ORAL
  Filled 2022-01-16: qty 1

## 2022-01-16 MED ORDER — FENTANYL CITRATE (PF) 250 MCG/5ML IJ SOLN
INTRAMUSCULAR | Status: DC | PRN
  Administered 2022-01-16: 100 ug via INTRAVENOUS

## 2022-01-16 MED ORDER — METHOCARBAMOL 500 MG PO TABS: 500.0000 mg | ORAL_TABLET | Freq: Four times a day (QID) | ORAL | Status: DC | PRN

## 2022-01-16 MED ORDER — BUPIVACAINE-EPINEPHRINE (PF) 0.5% -1:200000 IJ SOLN
INTRAMUSCULAR | Status: AC
  Filled 2022-01-16: qty 30

## 2022-01-16 MED ORDER — MIDAZOLAM HCL 2 MG/2ML IJ SOLN
INTRAMUSCULAR | Status: AC
  Filled 2022-01-16: qty 2

## 2022-01-16 MED ORDER — LACTATED RINGERS IV SOLN: INTRAVENOUS | Status: DC

## 2022-01-16 MED ORDER — CEFAZOLIN SODIUM-DEXTROSE 2-4 GM/100ML-% IV SOLN
2.0000 g | INTRAVENOUS | Status: AC
  Administered 2022-01-16: 2 g via INTRAVENOUS
  Filled 2022-01-16: qty 100

## 2022-01-16 MED ORDER — CHLORHEXIDINE GLUCONATE 0.12 % MT SOLN
OROMUCOSAL | Status: AC
  Administered 2022-01-16: 15 mL via OROMUCOSAL
  Filled 2022-01-16: qty 15

## 2022-01-16 MED ORDER — EPHEDRINE SULFATE-NACL 50-0.9 MG/10ML-% IV SOSY
PREFILLED_SYRINGE | INTRAVENOUS | Status: DC | PRN
  Administered 2022-01-16 (×2): 5 mg via INTRAVENOUS

## 2022-01-16 MED ORDER — APIXABAN 2.5 MG PO TABS
2.5000 mg | ORAL_TABLET | Freq: Two times a day (BID) | ORAL | Status: DC
  Administered 2022-01-18: 2.5 mg via ORAL
  Filled 2022-01-16 (×2): qty 1

## 2022-01-16 MED ORDER — BUPIVACAINE-EPINEPHRINE (PF) 0.5% -1:200000 IJ SOLN: INTRAMUSCULAR | Status: DC | PRN

## 2022-01-16 MED ORDER — DOCUSATE SODIUM 100 MG PO CAPS
100.0000 mg | ORAL_CAPSULE | Freq: Two times a day (BID) | ORAL | Status: DC
  Administered 2022-01-18: 100 mg via ORAL
  Filled 2022-01-16 (×3): qty 1

## 2022-01-16 MED ORDER — OXYCODONE HCL 5 MG PO TABS: 5.0000 mg | ORAL_TABLET | Freq: Once | ORAL | Status: DC | PRN

## 2022-01-16 MED ORDER — LIDOCAINE 2% (20 MG/ML) 5 ML SYRINGE
INTRAMUSCULAR | Status: DC | PRN
  Administered 2022-01-16: 80 mg via INTRAVENOUS

## 2022-01-16 MED ORDER — ACETAMINOPHEN 500 MG PO TABS
1000.0000 mg | ORAL_TABLET | Freq: Once | ORAL | Status: AC
  Administered 2022-01-16: 1000 mg via ORAL
  Filled 2022-01-16: qty 2

## 2022-01-16 SURGICAL SUPPLY — 64 items
ADH SKN CLS APL DERMABOND .7 (GAUZE/BANDAGES/DRESSINGS) ×1
ALCOHOL 70% 16 OZ (MISCELLANEOUS) ×1 IMPLANT
APL PRP STRL LF DISP 70% ISPRP (MISCELLANEOUS) ×1
BAG COUNTER SPONGE SURGICOUNT (BAG) ×1 IMPLANT
BAG SPNG CNTER NS LX DISP (BAG) ×1
BLADE CLIPPER SURG (BLADE) IMPLANT
CHLORAPREP W/TINT 26 (MISCELLANEOUS) ×1 IMPLANT
COVER SURGICAL LIGHT HANDLE (MISCELLANEOUS) ×1 IMPLANT
DERMABOND ADVANCED .7 DNX12 (GAUZE/BANDAGES/DRESSINGS) ×2 IMPLANT
DRAPE C-ARM 42X72 X-RAY (DRAPES) ×1 IMPLANT
DRAPE STERI IOBAN 125X83 (DRAPES) ×1 IMPLANT
DRAPE U-SHAPE 47X51 STRL (DRAPES) ×3 IMPLANT
DRSG AQUACEL AG ADV 3.5X10 (GAUZE/BANDAGES/DRESSINGS) ×1 IMPLANT
ELECT BLADE 4.0 EZ CLEAN MEGAD (MISCELLANEOUS) ×1
ELECT PENCIL ROCKER SW 15FT (MISCELLANEOUS) ×1 IMPLANT
ELECT REM PT RETURN 9FT ADLT (ELECTROSURGICAL) ×1
ELECTRODE BLDE 4.0 EZ CLN MEGD (MISCELLANEOUS) ×1 IMPLANT
ELECTRODE REM PT RTRN 9FT ADLT (ELECTROSURGICAL) ×1 IMPLANT
EVACUATOR 1/8 PVC DRAIN (DRAIN) IMPLANT
GLOVE BIO SURGEON STRL SZ8.5 (GLOVE) ×2 IMPLANT
GLOVE BIOGEL M 7.0 STRL (GLOVE) ×1 IMPLANT
GLOVE BIOGEL PI IND STRL 7.5 (GLOVE) ×1 IMPLANT
GLOVE BIOGEL PI IND STRL 8.5 (GLOVE) ×1 IMPLANT
GOWN STRL REUS W/ TWL LRG LVL3 (GOWN DISPOSABLE) ×2 IMPLANT
GOWN STRL REUS W/ TWL XL LVL3 (GOWN DISPOSABLE) ×1 IMPLANT
GOWN STRL REUS W/TWL 2XL LVL3 (GOWN DISPOSABLE) ×1 IMPLANT
GOWN STRL REUS W/TWL LRG LVL3 (GOWN DISPOSABLE) ×2
GOWN STRL REUS W/TWL XL LVL3 (GOWN DISPOSABLE) ×1
HANDPIECE INTERPULSE COAX TIP (DISPOSABLE) ×1
HEAD CERAMIC BIOLOX 36 T1 STD (Head) IMPLANT
HOOD W/PEELAWAY (MISCELLANEOUS) IMPLANT
JET LAVAGE IRRISEPT WOUND (IRRIGATION / IRRIGATOR) ×1
KIT BASIN OR (CUSTOM PROCEDURE TRAY) ×1 IMPLANT
KIT TURNOVER KIT B (KITS) ×1 IMPLANT
LAVAGE JET IRRISEPT WOUND (IRRIGATION / IRRIGATOR) ×1 IMPLANT
LINER ACETAB NN G7 F 36 (Liner) IMPLANT
MANIFOLD NEPTUNE II (INSTRUMENTS) ×1 IMPLANT
MARKER SKIN DUAL TIP RULER LAB (MISCELLANEOUS) ×2 IMPLANT
NDL SPNL 18GX3.5 QUINCKE PK (NEEDLE) ×1 IMPLANT
NEEDLE SPNL 18GX3.5 QUINCKE PK (NEEDLE) ×1 IMPLANT
NS IRRIG 1000ML POUR BTL (IV SOLUTION) ×1 IMPLANT
PACK TOTAL JOINT (CUSTOM PROCEDURE TRAY) ×1 IMPLANT
PACK UNIVERSAL I (CUSTOM PROCEDURE TRAY) ×1 IMPLANT
PAD ARMBOARD 7.5X6 YLW CONV (MISCELLANEOUS) ×2 IMPLANT
SAW OSC TIP CART 19.5X105X1.3 (SAW) ×1 IMPLANT
SEALER BIPOLAR AQUA 6.0 (INSTRUMENTS) IMPLANT
SET HNDPC FAN SPRY TIP SCT (DISPOSABLE) ×1 IMPLANT
SHELL ACET G7 4H 56 SZF (Shell) IMPLANT
SOL PREP POV-IOD 4OZ 10% (MISCELLANEOUS) ×1 IMPLANT
STEM FEM CMTL 15X150 133D (Stem) IMPLANT
SUT ETHIBOND NAB CT1 #1 30IN (SUTURE) ×2 IMPLANT
SUT MNCRL AB 3-0 PS2 18 (SUTURE) ×1 IMPLANT
SUT MON AB 2-0 CT1 36 (SUTURE) ×1 IMPLANT
SUT VIC AB 2-0 CT1 27 (SUTURE) ×1
SUT VIC AB 2-0 CT1 TAPERPNT 27 (SUTURE) ×1 IMPLANT
SUT VLOC 180 0 24IN GS25 (SUTURE) ×1 IMPLANT
SYR 50ML LL SCALE MARK (SYRINGE) ×1 IMPLANT
TOWEL GREEN STERILE (TOWEL DISPOSABLE) ×1 IMPLANT
TOWEL GREEN STERILE FF (TOWEL DISPOSABLE) ×1 IMPLANT
TRAY CATH 16FR W/PLASTIC CATH (SET/KITS/TRAYS/PACK) IMPLANT
TRAY FOLEY W/BAG SLVR 16FR (SET/KITS/TRAYS/PACK)
TRAY FOLEY W/BAG SLVR 16FR ST (SET/KITS/TRAYS/PACK) IMPLANT
TUBE SUCT ARGYLE STRL (TUBING) ×1 IMPLANT
WATER STERILE IRR 1000ML POUR (IV SOLUTION) ×3 IMPLANT
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
## 2022-01-16 NOTE — Discharge Instructions (Signed)
? ?Dr. Brian Swinteck ?Joint Replacement Specialist ?[facility] ?[address] ?, [place] [address] ?[phone] ? ? ?TOTAL HIP REPLACEMENT POSTOPERATIVE DIRECTIONS ? ? ? ?Hip Rehabilitation, Guidelines Following Surgery  ? ?WEIGHT BEARING ?Weight bearing as tolerated with assist device (walker, cane, etc) as directed, use it as long as suggested by your surgeon or therapist, typically at least 4-6 weeks. ? ?The results of a hip operation are greatly improved after range of motion and muscle strengthening exercises. Follow all safety measures which are given to protect your hip. If any of these exercises cause increased pain or swelling in your joint, decrease the amount until you are comfortable again. Then slowly increase the exercises. Call your caregiver if you have problems or questions.  ? ?HOME CARE INSTRUCTIONS  ?Most of the following instructions are designed to prevent the dislocation of your new hip.  ?Remove items at home which could result in a fall. This includes throw rugs or furniture in walking pathways.  ?Continue medications as instructed at time of discharge. ?You may have some home medications which will be placed on hold until you complete the course of blood thinner medication. ?You may start showering once you are discharged home. Do not remove your dressing. ?Do not put on socks or shoes without following the instructions of your caregivers.   ?Sit on chairs with arms. Use the chair arms to help push yourself up when arising.  ?Arrange for the use of a toilet seat elevator so you are not sitting low.  ?Walk with walker as instructed.  ?You may resume a sexual relationship in one month or when given the OK by your caregiver.  ?Use walker as long as suggested by your caregivers.  ?You may put full weight on your legs and walk as much as is comfortable. ?Avoid periods of inactivity such as sitting longer than an hour when not asleep. This helps prevent blood  clots.  ?You may return to work once you are cleared by your surgeon.  ?Do not drive a car for 6 weeks or until released by your surgeon.  ?Do not drive while taking narcotics.  ?Wear elastic stockings for two weeks following surgery during the day but you may remove then at night.  ?Make sure you keep all of your appointments after your operation with all of your doctors and caregivers. You should call the office at the above phone number and make an appointment for approximately two weeks after the date of your surgery. ?Please pick up a stool softener and laxative for home use as long as you are requiring pain medications. ?ICE to the affected hip every three hours for 30 minutes at a time and then as needed for pain and swelling. Continue to use ice on the hip for pain and swelling from surgery. You may notice swelling that will progress down to the foot and ankle.  This is normal after surgery.  Elevate the leg when you are not up walking on it.   ?It is important for you to complete the blood thinner medication as prescribed by your doctor. ?Continue to use the breathing machine which will help keep your temperature down.  It is common for your temperature to cycle up and down following surgery, especially at night when you are not up moving around and exerting yourself.  The breathing machine keeps your lungs expanded and your temperature down. ? ?RANGE OF MOTION AND STRENGTHENING EXERCISES  ?These exercises are designed to help you   keep full movement of your hip joint. Follow your caregiver's or physical therapist's instructions. Perform all exercises about fifteen times, three times per day or as directed. Exercise both hips, even if you have had only one joint replacement. These exercises can be done on a training (exercise) mat, on the floor, on a table or on a bed. Use whatever works the best and is most comfortable for you. Use music or television while you are exercising so that the exercises are a  pleasant break in your day. This will make your life better with the exercises acting as a break in routine you can look forward to.  ?Lying on your back, slowly slide your foot toward your buttocks, raising your knee up off the floor. Then slowly slide your foot back down until your leg is straight again.  ?Lying on your back spread your legs as far apart as you can without causing discomfort.  ?Lying on your side, raise your upper leg and foot straight up from the floor as far as is comfortable. Slowly lower the leg and repeat.  ?Lying on your back, tighten up the muscle in the front of your thigh (quadriceps muscles). You can do this by keeping your leg straight and trying to raise your heel off the floor. This helps strengthen the largest muscle supporting your knee.  ?Lying on your back, tighten up the muscles of your buttocks both with the legs straight and with the knee bent at a comfortable angle while keeping your heel on the floor.  ? ?SKILLED REHAB INSTRUCTIONS: ?If the patient is transferred to a skilled rehab facility following release from the hospital, a list of the current medications will be sent to the facility for the patient to continue.  When discharged from the skilled rehab facility, please have the facility set up the patient's Home Health Physical Therapy prior to being released. Also, the skilled facility will be responsible for providing the patient with their medications at time of release from the facility to include their pain medication and their blood thinner medication. If the patient is still at the rehab facility at time of the two week follow up appointment, the skilled rehab facility will also need to assist the patient in arranging follow up appointment in our office and any transportation needs. ? ?POST-OPERATIVE OPIOID TAPER INSTRUCTIONS: ?It is important to wean off of your opioid medication as soon as possible. If you do not need pain medication after your surgery it is ok  to stop day one. ?Opioids include: ?Codeine, Hydrocodone(Norco, Vicodin), Oxycodone(Percocet, oxycontin) and hydromorphone amongst others.  ?Long term and even short term use of opiods can cause: ?Increased pain response ?Dependence ?Constipation ?Depression ?Respiratory depression ?And more.  ?Withdrawal symptoms can include ?Flu like symptoms ?Nausea, vomiting ?And more ?Techniques to manage these symptoms ?Hydrate well ?Eat regular healthy meals ?Stay active ?Use relaxation techniques(deep breathing, meditating, yoga) ?Do Not substitute Alcohol to help with tapering ?If you have been on opioids for less than two weeks and do not have pain than it is ok to stop all together.  ?Plan to wean off of opioids ?This plan should start within one week post op of your joint replacement. ?Maintain the same interval or time between taking each dose and first decrease the dose.  ?Cut the total daily intake of opioids by one tablet each day ?Next start to increase the time between doses. ?The last dose that should be eliminated is the evening dose.  ? ? ?MAKE   SURE YOU:  ?Understand these instructions.  ?Will watch your condition.  ?Will get help right away if you are not doing well or get worse. ? ?Pick up stool softner and laxative for home use following surgery while on pain medications. ?Do not remove your dressing. ?The dressing is waterproof--it is OK to take showers. ?Continue to use ice for pain and swelling after surgery. ?Do not use any lotions or creams on the incision until instructed by your surgeon. ?Total Hip Protocol. ? ?
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
## 2022-01-16 NOTE — Anesthesia Procedure Notes (Signed)
Procedure Name: Intubation Date/Time: 01/16/2022 3:50 PM  Performed by: Reece Agar, CRNAPre-anesthesia Checklist: Patient identified, Emergency Drugs available, Suction available and Patient being monitored Patient Re-evaluated:Patient Re-evaluated prior to induction Oxygen Delivery Method: Circle System Utilized Preoxygenation: Pre-oxygenation with 100% oxygen Induction Type: IV induction Ventilation: Mask ventilation without difficulty Laryngoscope Size: Mac and 4 Grade View: Grade II Tube type: Oral Tube size: 7.5 mm Number of attempts: 1 Airway Equipment and Method: Stylet Placement Confirmation: ETT inserted through vocal cords under direct vision, positive ETCO2 and breath sounds checked- equal and bilateral Secured at: 23 cm Tube secured with: Tape Dental Injury: Teeth and Oropharynx as per pre-operative assessment
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
## 2022-01-16 NOTE — Anesthesia Preprocedure Evaluation (Addendum)
Anesthesia Evaluation  Patient identified by MRN, date of birth, ID band Patient awake    Reviewed: Allergy & Precautions, NPO status , Patient's Chart, lab work & pertinent test results  History of Anesthesia Complications Negative for: history of anesthetic complications  Airway Mallampati: II  TM Distance: >3 FB Neck ROM: Full    Dental  (+) Missing, Poor Dentition,    Pulmonary    Pulmonary exam normal        Cardiovascular + CAD  Normal cardiovascular exam  TTE 2022: EF 55-60%, grade II DD, mild biatrial enlargement, valves ok, mild dilatation of the ascending  aorta, measuring 38 mm.   Neuro/Psych    GI/Hepatic GERD  ,  Endo/Other    Renal/GU      Musculoskeletal  (+) Arthritis , Right hip fracture   Abdominal   Peds  Hematology  (+) Blood dyscrasia (Hgb 11.2, Plt 181k), anemia ,   Anesthesia Other Findings   Reproductive/Obstetrics                            Anesthesia Physical Anesthesia Plan  ASA: 3  Anesthesia Plan: General   Post-op Pain Management: Tylenol PO (pre-op)*   Induction: Intravenous  PONV Risk Score and Plan: 2 and Treatment may vary due to age or medical condition, Dexamethasone and Ondansetron  Airway Management Planned: Oral ETT  Additional Equipment: None  Intra-op Plan:   Post-operative Plan: Extubation in OR  Informed Consent: I have reviewed the patients History and Physical, chart, labs and discussed the procedure including the risks, benefits and alternatives for the proposed anesthesia with the patient or authorized representative who has indicated his/her understanding and acceptance.     Dental advisory given  Plan Discussed with: CRNA  Anesthesia Plan Comments:       Anesthesia Quick Evaluation

## 2022-01-16 NOTE — Interval H&P Note (Signed)
History and Physical Interval Note:  01/16/2022 3:26 PM  Brett Dawson  has presented today for surgery, with the diagnosis of right hip fracture.  The various methods of treatment have been discussed with the patient and family. After consideration of risks, benefits and other options for treatment, the patient has consented to  Procedure(s): TOTAL HIP ARTHROPLASTY ANTERIOR APPROACH (Right) as a surgical intervention.  The patient's history has been reviewed, patient examined, no change in status, stable for surgery.  I have reviewed the patient's chart and labs.  Questions were answered to the patient's satisfaction.    The risks, benefits, and alternatives were discussed with the patient. There are risks associated with the surgery including, but not limited to, problems with anesthesia (death), infection, instability (giving out of the joint), dislocation, differences in leg length/angulation/rotation, fracture of bones, loosening or failure of implants, hematoma (blood accumulation) which may require surgical drainage, blood clots, pulmonary embolism, nerve injury (foot drop and lateral thigh numbness), and blood vessel injury. The patient understands these risks and elects to proceed.    Hilton Cork Lileigh Fahringer

## 2022-01-17 ENCOUNTER — Encounter (HOSPITAL_COMMUNITY): Payer: Self-pay | Admitting: Orthopedic Surgery

## 2022-01-17 DIAGNOSIS — S72001A Fracture of unspecified part of neck of right femur, initial encounter for closed fracture: Secondary | ICD-10-CM | POA: Diagnosis not present

## 2022-01-17 LAB — BASIC METABOLIC PANEL
Anion gap: 5 (ref 5–15)
BUN: 12 mg/dL (ref 8–23)
CO2: 23 mmol/L (ref 22–32)
Calcium: 7.9 mg/dL — ABNORMAL LOW (ref 8.9–10.3)
Chloride: 107 mmol/L (ref 98–111)
Creatinine, Ser: 0.8 mg/dL (ref 0.61–1.24)
GFR, Estimated: >60 mL/min (ref >60)
Glucose, Bld: 199 mg/dL — ABNORMAL HIGH (ref 70–99)
Potassium: 4.1 mmol/L (ref 3.5–5.1)
Sodium: 135 mmol/L (ref 135–145)

## 2022-01-17 LAB — CBC
HCT: 30 % — ABNORMAL LOW (ref 39.0–52.0)
Hemoglobin: 10.2 g/dL — ABNORMAL LOW (ref 13.0–17.0)
MCH: 31.8 pg (ref 26.0–34.0)
MCHC: 34 g/dL (ref 30.0–36.0)
MCV: 93.5 fL (ref 80.0–100.0)
Platelets: 160 10*3/uL (ref 150–400)
RBC: 3.21 MIL/uL — ABNORMAL LOW (ref 4.22–5.81)
RDW: 14.4 % (ref 11.5–15.5)
WBC: 6.3 10*3/uL (ref 4.0–10.5)
nRBC: 0 % (ref 0.0–0.2)

## 2022-01-17 NOTE — Anesthesia Postprocedure Evaluation (Signed)
Anesthesia Post Note  Patient: Brett Dawson  Procedure(s) Performed: RIGHT TOTAL HIP ARTHROPLASTY (Right: Hip)     Patient location during evaluation: PACU Anesthesia Type: General Level of consciousness: awake and alert Pain management: pain level controlled Vital Signs Assessment: post-procedure vital signs reviewed and stable Respiratory status: spontaneous breathing, nonlabored ventilation and respiratory function stable Cardiovascular status: blood pressure returned to baseline Postop Assessment: no apparent nausea or vomiting Anesthetic complications: no   No notable events documented.           Marthenia Rolling
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
## 2022-01-17 NOTE — Progress Notes (Signed)
Patient refused all medications. Patient educated on risks and benefits. Patient verbalized understanding. Provider aware. No further orders at this time.

## 2022-01-17 NOTE — Plan of Care (Signed)
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
## 2022-01-18 ENCOUNTER — Other Ambulatory Visit: Payer: Self-pay

## 2022-01-18 DIAGNOSIS — S72001A Fracture of unspecified part of neck of right femur, initial encounter for closed fracture: Secondary | ICD-10-CM | POA: Diagnosis not present

## 2022-01-18 LAB — CBC
HCT: 25.9 % — ABNORMAL LOW (ref 39.0–52.0)
Hemoglobin: 8.9 g/dL — ABNORMAL LOW (ref 13.0–17.0)
MCH: 31.4 pg (ref 26.0–34.0)
MCHC: 34.4 g/dL (ref 30.0–36.0)
MCV: 91.5 fL (ref 80.0–100.0)
Platelets: 125 10*3/uL — ABNORMAL LOW (ref 150–400)
RBC: 2.83 MIL/uL — ABNORMAL LOW (ref 4.22–5.81)
RDW: 14.7 % (ref 11.5–15.5)
WBC: 4.9 10*3/uL (ref 4.0–10.5)
nRBC: 0 % (ref 0.0–0.2)

## 2022-01-18 MED ORDER — HYDROCODONE-ACETAMINOPHEN 10-325 MG PO TABS: 0.5000 | ORAL_TABLET | ORAL | 0 refills | Status: AC | PRN

## 2022-01-18 MED ORDER — APIXABAN 2.5 MG PO TABS: 2.5000 mg | ORAL_TABLET | Freq: Two times a day (BID) | ORAL | 0 refills | Status: AC
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
## 2022-01-18 NOTE — Care Management Important Message (Signed)
Important Message  Patient Details  Name: Brett Dawson MRN: [id number] Date of Birth: 02/22/1956   Medicare Important Message Given:  Yes     Hannah Beat 01/18/2022, 12:11 PM

## 2022-01-18 NOTE — Progress Notes (Signed)
    Subjective:  Patient reports pain as mild.  Denies N/V/CP/SOB/Abd pain. He states his hip is feeling good just a little bit sore. He states he worked with PT yesterday some in the room and walked some and it felt okay. He denies tinging and numbness in LE bilaterally.   Note in chart states he did not want to take any medications. We discussed the importance of taking Eliquis to help prevent blood clots and the risks of not taking them. He is agreeable to take them after our discussion.   Objective:   VITALS:   Vitals:   01/17/22 0400 01/17/22 0953 01/17/22 1452 01/18/22 0619  BP: 101/62 (!) 91/42 101/67 113/68  Pulse: (!) 57 70 70 69  Resp:  $Remo'18 18 15  'HYEFs$ Temp: 98.3 F (36.8 C) 98.4 F (36.9 C) 98.8 F (37.1 C) 98.4 F (36.9 C)  TempSrc: Oral     SpO2: 97% 98% 100% 97%  Weight:      Height:        Patient is lying comfortably in bed this morning. NAD.  Neurologically intact ABD soft Neurovascular intact Sensation intact distally Intact pulses distally Dorsiflexion/Plantar flexion intact Incision: dressing C/D/I No cellulitis present Compartment soft   Lab Results  Component Value Date   WBC 4.9 01/18/2022   HGB 8.9 (L) 01/18/2022   HCT 25.9 (L) 01/18/2022   MCV 91.5 01/18/2022   PLT 125 (L) 01/18/2022   BMET    Component Value Date/Time   NA 135 01/17/2022 0113   NA 142 01/12/2021 1527   K 4.1 01/17/2022 0113   CL 107 01/17/2022 0113   CO2 23 01/17/2022 0113   GLUCOSE 199 (H) 01/17/2022 0113   BUN 12 01/17/2022 0113   BUN 16 01/12/2021 1527   CREATININE 0.80 01/17/2022 0113   CREATININE 0.73 03/12/2021 1203   CALCIUM 7.9 (L) 01/17/2022 0113   EGFR 106 01/12/2021 1527   GFRNONAA >60 01/17/2022 0113   GFRNONAA >60 03/12/2021 1203     Assessment/Plan: 2 Days Post-Op   Principal Problem:   Closed fracture of neck of right femur (HCC) Active Problems:   Acute right hip pain   Fall at home, initial encounter   HLD (hyperlipidemia)   Anemia of  chronic disease   WBAT with walker DVT ppx:  Eliquis 2.5 mg BID , SCDs, TEDS. Patient has been refusing his eliquis. Discussed this morning with patient about the risks and benefits. He is agreeable to take as of our conversation this morning.  PO pain control: He has not been requiring any medication.  PT/OT: Ambulated in room with PT yesterday. Continue PT today.  Dispo: Patient under care of the medical team, disposition per their recommendation. Patient states he has a walker at home, but normally does not require any assistive devices. From an orthopedic stand point once he clears PT, he can discharge home. Pain medication and DVT ppx sent to pharmacy.     Charlott Rakes, PA-C 01/18/2022, 7:05 AM   [facility]  [place] [address], [place], [place] [address]

## 2022-01-18 NOTE — Progress Notes (Signed)
Physical Therapy Treatment Patient Details Name: Brett Dawson MRN: [id number] DOB: April 12, 1956 Today's Date: 01/18/2022   History of Present Illness 66 yo male with onset of fall ground level sustained a R hip pain, admitted 9/12, received THA for R femoral neck fracture with direct anterior approach and WBAT.  Pt has PMHx:  anemia, HLD, esophageal CA, PAT, CAD, transfusion, bradycardia, PVC's, EtOH, gout    PT Comments    Pt instructed in and completed gait training, stair training specific to his home environment, and therex. Pt with excellent progress and cleared for discharge from physical therapy standpoint. Pt reports that he will have assistance from his brother upon discharge. All of pt's questions were answered.    Recommendations for follow up therapy are one component of a multi-disciplinary discharge planning process, led by the attending physician.  Recommendations may be updated based on patient status, additional functional criteria and insurance authorization.  Follow Up Recommendations  Home health PT     Assistance Recommended at Discharge Intermittent Supervision/Assistance  Patient can return home with the following A little help with walking and/or transfers;A little help with bathing/dressing/bathroom;Assist for transportation;Help with stairs or ramp for entrance;Assistance with cooking/housework   Equipment Recommendations  BSC/3in1    Recommendations for Other Services       Precautions / Restrictions Precautions Precautions: Fall Restrictions Weight Bearing Restrictions: Yes RLE Weight Bearing: Weight bearing as tolerated Other Position/Activity Restrictions: direct anterior approach     Mobility  Bed Mobility Overal bed mobility: Modified Independent Bed Mobility: Supine to Sit     Supine to sit: Modified independent (Device/Increase time)          Transfers Overall transfer level: Needs assistance Equipment used: Rolling walker (2  wheels) Transfers: Sit to/from Stand Sit to Stand: Supervision           General transfer comment: Supervision for safety    Ambulation/Gait Ambulation/Gait assistance: Supervision, Min guard Gait Distance (Feet): 225 Feet Assistive device: Rolling walker (2 wheels) Gait Pattern/deviations: Step-through pattern, Decreased stride length, Decreased weight shift to right, Step-to pattern, Decreased stance time - right, Antalgic Gait velocity: reduced Gait velocity interpretation: 1.31 - 2.62 ft/sec, indicative of limited community ambulator   General Gait Details: Pt able to quickly progress from step to pattern to step through pattern. Pt with no LOB. Cues to keep RW on floor rather than picking AD up.   Stairs Stairs: Yes Stairs assistance: Min guard Stair Management: No rails, Forwards, With walker, Step to pattern Number of Stairs: 1 General stair comments: Pt ascended/descended 1 stair respectively with RW (pt has two steps at home but are separated). Also, discussed using cane and single rail as alternative. Pt with no LOB.   Wheelchair Mobility    Modified Rankin (Stroke Patients Only)       Balance Overall balance assessment: Needs assistance, History of Falls Sitting-balance support: Feet supported Sitting balance-Leahy Scale: Good       Standing balance-Leahy Scale: Fair                              Cognition Arousal/Alertness: Awake/alert Behavior During Therapy: WFL for tasks assessed/performed Overall Cognitive Status: Within Functional Limits for tasks assessed  Exercises Total Joint Exercises Ankle Circles/Pumps: AROM, Both, 20 reps, Supine Quad Sets: 10 reps, Both, Supine, Other (comment) (3 sec holds) Hip ABduction/ADduction: Right, 10 reps, Supine Knee Flexion: Right, 10 reps, Standing, Other (comment) (with RW)    General Comments        Pertinent Vitals/Pain Pain  Assessment Pain Assessment: No/denies pain Pain Location: R hip Pain Descriptors / Indicators: Operative site guarding Pain Intervention(s): Monitored during session    Home Living                          Prior Function            PT Goals (current goals can now be found in the care plan section) Acute Rehab PT Goals Patient Stated Goal: to get home as soon as possible PT Goal Formulation: With patient Time For Goal Achievement: 01/24/22 Potential to Achieve Goals: Good Progress towards PT goals: Progressing toward goals    Frequency    Min 5X/week      PT Plan Current plan remains appropriate    Co-evaluation              AM-PAC PT "6 Clicks" Mobility   Outcome Measure  Help needed turning from your back to your side while in a flat bed without using bedrails?: None Help needed moving from lying on your back to sitting on the side of a flat bed without using bedrails?: None Help needed moving to and from a bed to a chair (including a wheelchair)?: A Little Help needed standing up from a chair using your arms (e.g., wheelchair or bedside chair)?: A Little Help needed to walk in hospital room?: A Little Help needed climbing 3-5 steps with a railing? : A Little 6 Click Score: 20    End of Session Equipment Utilized During Treatment: Gait belt Activity Tolerance: Patient tolerated treatment well Patient left: in chair;with call bell/phone within reach Nurse Communication: Mobility status PT Visit Diagnosis: Unsteadiness on feet (R26.81);Muscle weakness (generalized) (M62.81);Pain Pain - Right/Left: Right Pain - part of body: Hip     Time: 2703-5009 PT Time Calculation (min) (ACUTE ONLY): 20 min  Charges:  $Gait Training: 8-22 mins                     Donna Bernard, PT    [facility] 01/18/2022, 8:55 AM

## 2022-01-18 NOTE — TOC Transition Note (Addendum)
Transition of Care [facility]) - CM/SW Discharge Note   Patient Details  Name: Brett Dawson MRN: [id number] Date of Birth: 08-13-1955  [facility] CM/SW Contact:  Sharin Mons, RN Phone Number: 01/18/2022, 10:42 AM   Clinical Narrative:    Patient will DC to: home Anticipated DC date: 01/18/2022 Family notified: yes Transport by: car          - s/p R THA, 9/12 Per MD patient ready for DC today . RN, patient, and patient's family aware of DC.   Order noted for home health services and DME need. Pt agreeable to home health services, no provider preference.Referral made with [facility] and accepted. Pt declined 3in1/BSC, has RW @ home. Pt states brother to assist and help once d/c. Post hospital f/u noted on AVS. Pt without Rx made concerns. Brother to provide transportation to home.  Lynnell Catalan (Brother),[phone]  RNCM will sign off for now as intervention is no longer needed. Please consult [place] again if new needs arise.    Final next level of care: Home w Home Health Services Barriers to Discharge: No Barriers Identified   Patient Goals and CMS Choice     Choice offered to / list presented to : Patient  Discharge Placement                       Discharge Plan and Services                DME Arranged:  (Declined 3in1/bsc)         HH Arranged: PT, OT HH Agency: [place] ([facility]) Date HH Agency Contacted: 01/18/22 Time [place]: 1042 Representative spoke with at [place]: [place] ([place]) Interventions     Readmission Risk Interventions     No data to display

## 2022-01-18 NOTE — Progress Notes (Cosign Needed)
    Durable Medical Equipment  (From admission, onward)           Start     Ordered   01/18/22 1026  For home use only DME 3 n 1  Once       Comments: Bedside commode   01/18/22 1026

## 2022-01-18 NOTE — Plan of Care (Signed)

## 2022-01-18 NOTE — Progress Notes (Deleted)
Received critical lab value on Hb 6.6 g/dl, NP J. Olena Heckle and A. Hoopa notified.

## 2022-01-28 ENCOUNTER — Encounter (HOSPITAL_COMMUNITY): Payer: Self-pay | Admitting: Orthopedic Surgery

## 2022-01-28 NOTE — Transfer of Care (Addendum)
Immediate Anesthesia Transfer of Care Note  Patient: Brett Dawson  Procedure(s) Performed: RIGHT TOTAL HIP ARTHROPLASTY (Right: Hip)  Patient Location: PACU  Anesthesia Type:General  Level of Consciousness: drowsy  Airway & Oxygen Therapy: Patient Spontanous Breathing and Patient connected to face mask oxygen  Post-op Assessment: Report given to RN and Post -op Vital signs reviewed and stable  Post vital signs: Reviewed and stable  Last Vitals:  Vitals Value Taken Time  BP 124/77 01/16/22 1750  Temp    Pulse 70 01/16/22 1750  Resp 9 01/16/22 1750  SpO2 96 % 01/16/22 1750    Last Pain:      Patients Stated Pain Goal: 0 (83/75/42 3702)  Complications: No notable events documented.

## 2022-01-29 ENCOUNTER — Other Ambulatory Visit: Payer: Self-pay

## 2022-01-31 ENCOUNTER — Encounter: Payer: Self-pay | Admitting: Physician Assistant

## 2022-01-31 ENCOUNTER — Encounter: Payer: Self-pay | Admitting: Hematology

## 2022-02-23 IMAGING — CT CT CHEST W/ CM
2 of 4 series · 15 of 36 positions shown, 18 images · IV contrast (OMNIPAQUE)
Comparison: CT abdomen pelvis, 09/14/2019

CLINICAL DATA: Esophageal mass diagnosed by endoscopy

EXAM:
CT CHEST WITH CONTRAST
TECHNIQUE: Multidetector CT imaging of the chest was performed during
intravenous contrast administration.
CONTRAST:  75mL OMNIPAQUE IOHEXOL 300 MG/ML  SOLN

[Series 2: axial st · axial · 0.81mm/px · z∈[-352,-80]mm · 12 of 162 slices shown, 15 images]
[im 13/162  mediastinal]
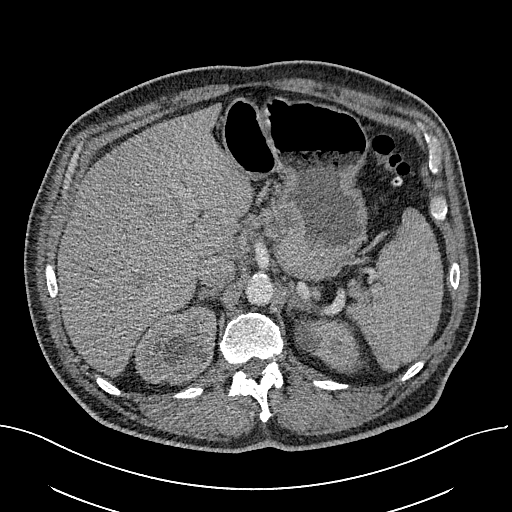
[im 13/162  lung]
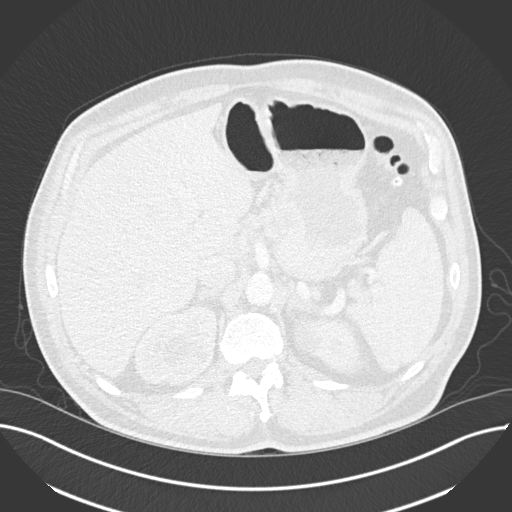
[im 25/162  lung]
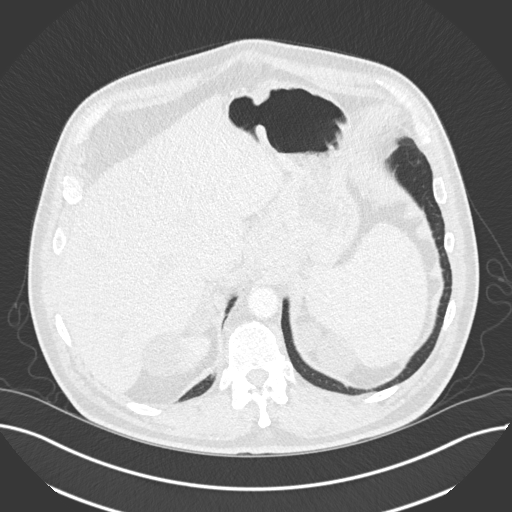
[im 38/162  lung]
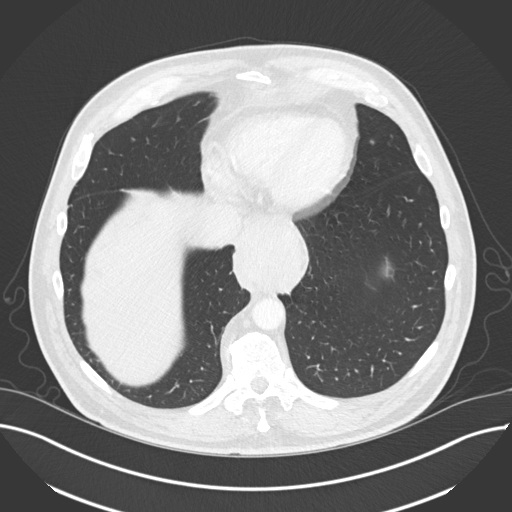
[im 50/162  lung]
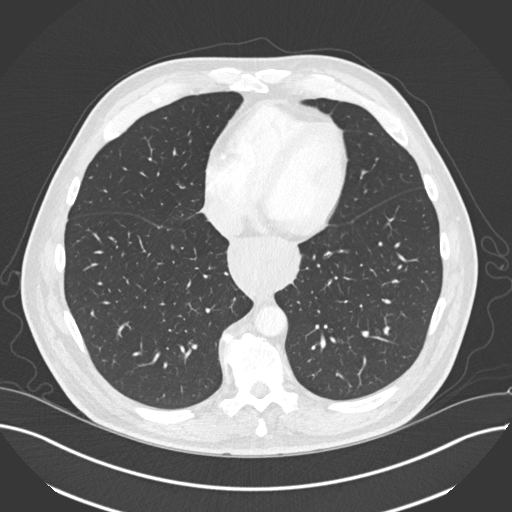
[im 62/162  mediastinal]
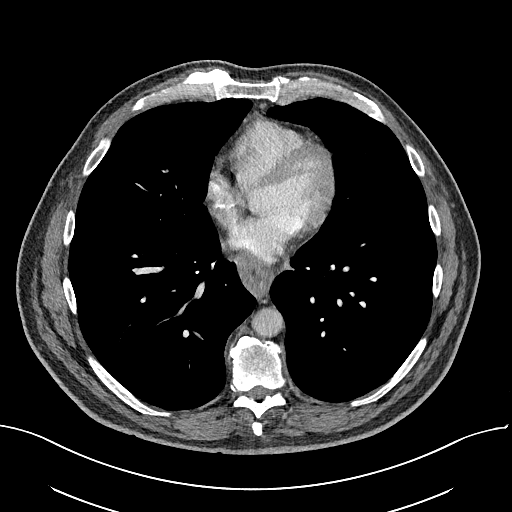
[im 62/162  lung]
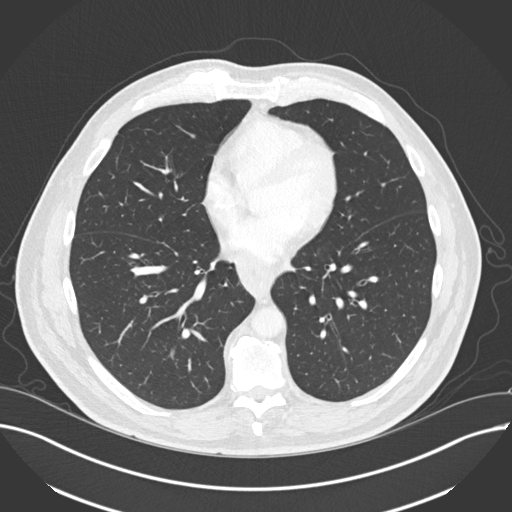
[im 75/162  lung]
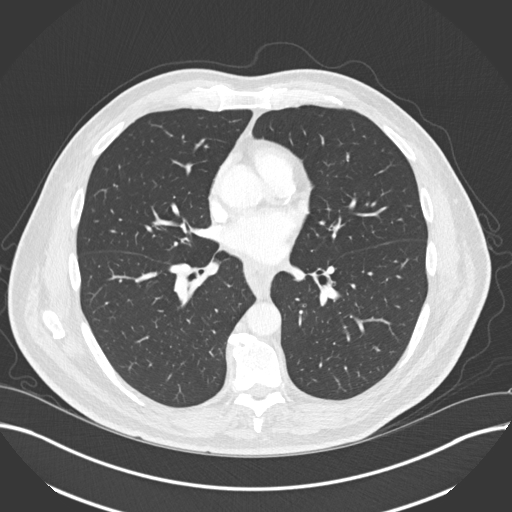
[im 87/162  lung]
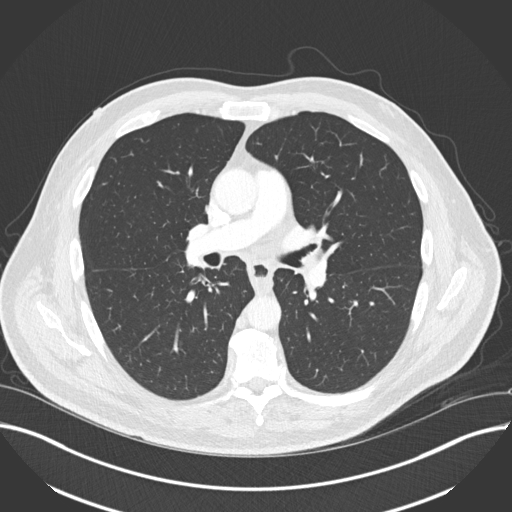
[im 100/162  lung]
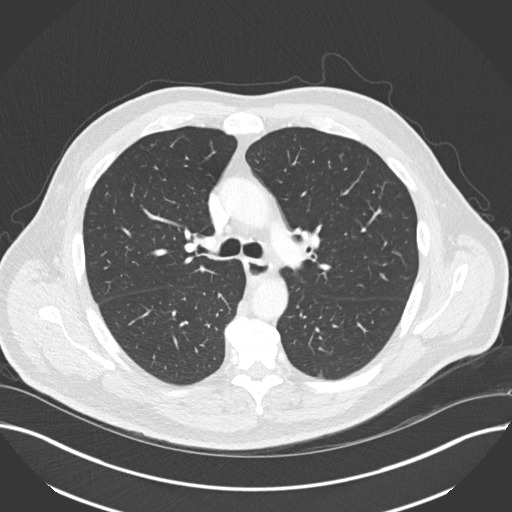
[im 112/162  mediastinal]
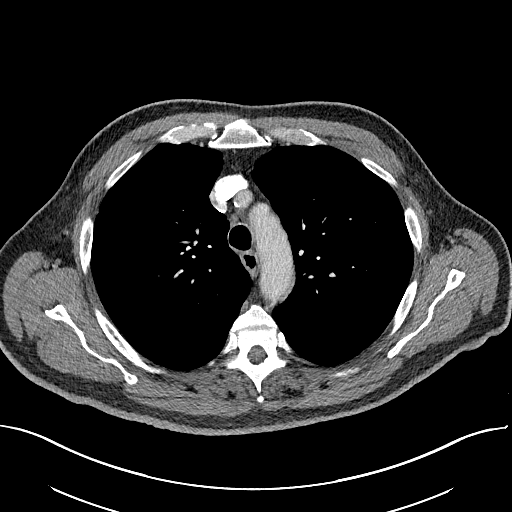
[im 112/162  lung]
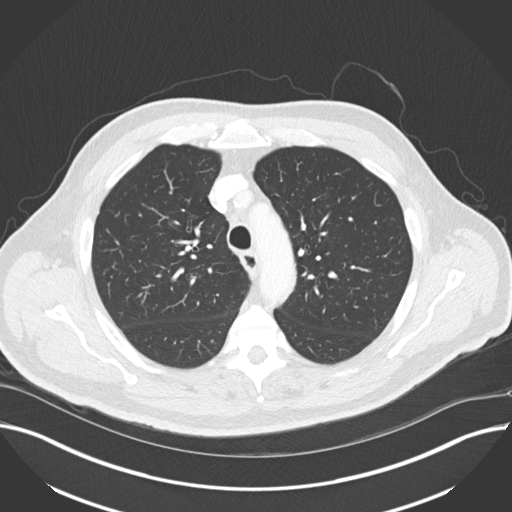
[im 124/162  lung]
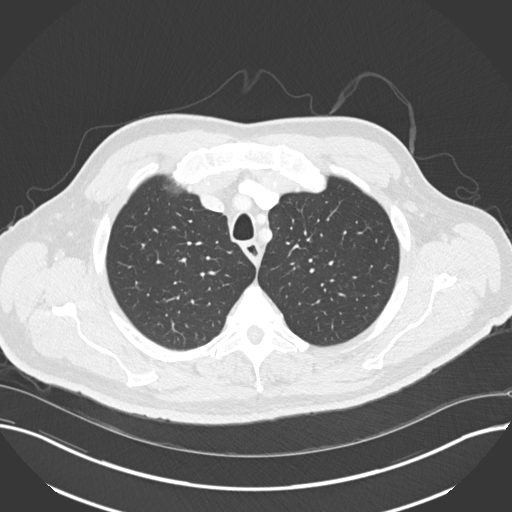
[im 137/162  lung]
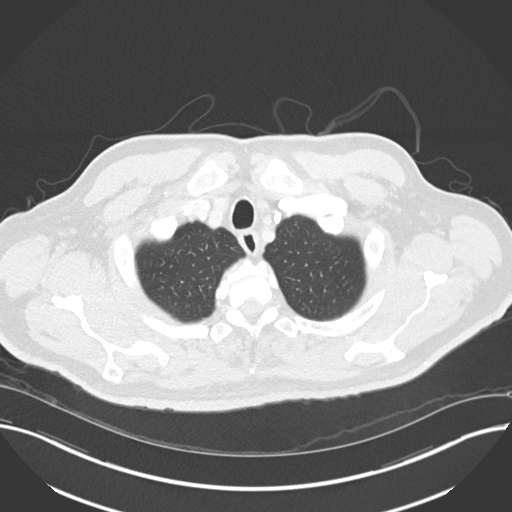
[im 149/162  lung]
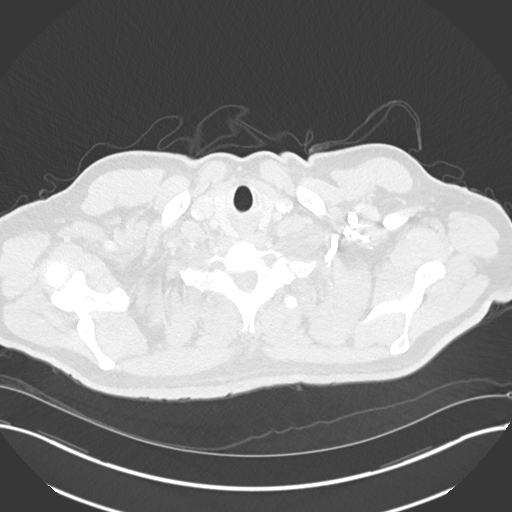

[Series 6: coronal · coronal · 0.68mm/px · 3 of 157 slices shown]
[im 32/157  lung]
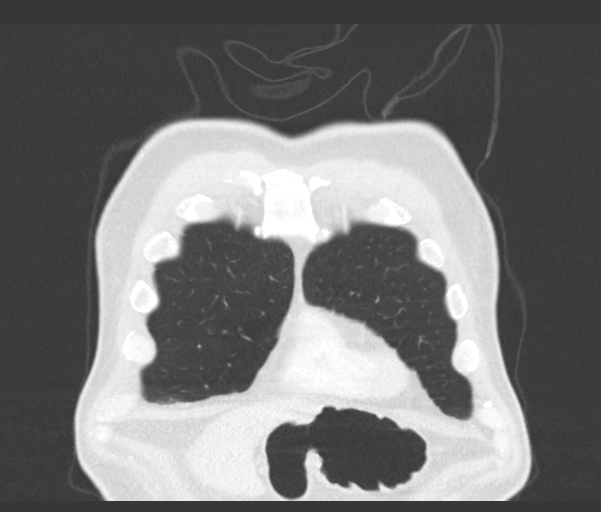
[im 63/157  lung]
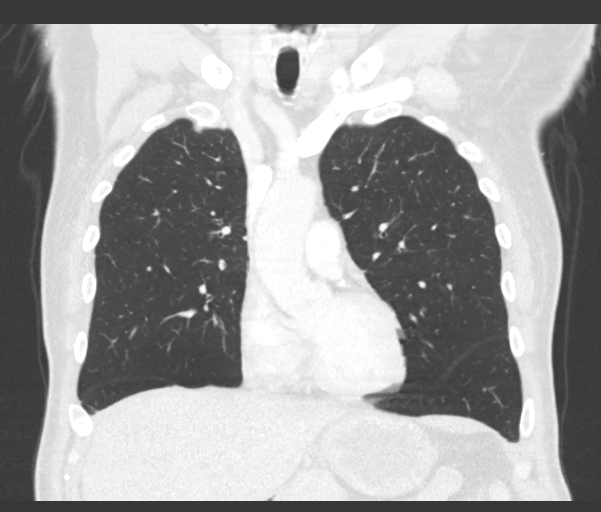
[im 94/157  lung]
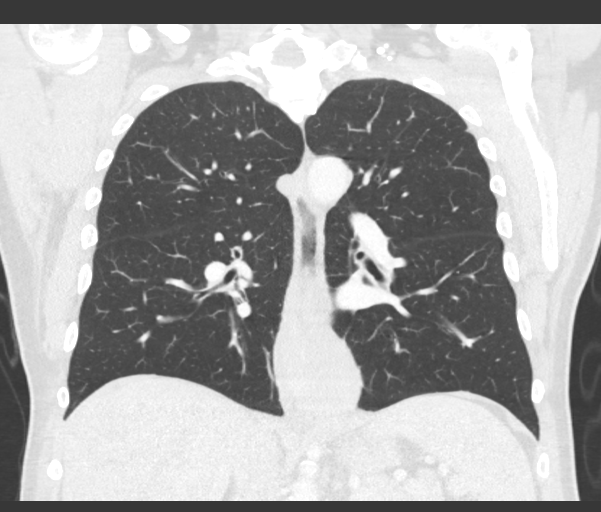

[15 of 36 positions shown; findings below may reference images not displayed]

FINDINGS: Cardiovascular: No significant vascular findings. Normal heart size.
Left coronary artery calcifications. No pericardial effusion.

Mediastinum/Nodes: No enlarged mediastinal, hilar, or axillary lymph
nodes. There is a large mass of the lower third of the esophagus,
with ill-defined margins, measuring approximately 6.0 x 5.4 x 7.7 cm
(series 2, image 121, series 6, image 88). This appears enlarged and
more appreciably masslike than appearance on prior CT dated
09/14/2019. Thyroid gland, trachea, and esophagus demonstrate no
significant findings.

Lungs/Pleura: Lungs are clear. No pleural effusion or pneumothorax.

Upper Abdomen: No acute abnormality. Multiple cysts of the included
kidneys. There are prominent gastrohepatic ligament lymph nodes
adjacent to the lesser curvature measuring up to 1.4 x 1.2 cm
(series 2, image 149).

Musculoskeletal: No chest wall mass or suspicious bone lesions
identified.
IMPRESSION: 1. There is a large mass of the lower third of the esophagus, with
ill-defined margins, measuring approximately 6.0 x 5.4 x 7.7 cm.
This appears enlarged and more appreciably masslike than appearance
on prior CT dated 09/14/2019. Findings are consistent with primary
esophageal malignancy.
2. There are prominent gastrohepatic ligament lymph nodes adjacent
to the lesser curvature, concerning for nodal metastatic disease
although not ideally imaged on this examination of the chest.
3. No evidence of metastatic disease within in the chest.
4. Coronary artery disease.

## 2022-03-11 ENCOUNTER — Encounter: Payer: Self-pay | Admitting: Physician Assistant

## 2022-05-30 ENCOUNTER — Encounter: Payer: Self-pay | Admitting: Physician Assistant

## 2022-06-14 ENCOUNTER — Encounter: Payer: Self-pay | Admitting: Physician Assistant

## 2022-06-21 ENCOUNTER — Ambulatory Visit (INDEPENDENT_AMBULATORY_CARE_PROVIDER_SITE_OTHER): Payer: Medicare Other | Admitting: Neurology

## 2022-06-21 ENCOUNTER — Encounter: Payer: Self-pay | Admitting: Neurology

## 2022-06-21 VITALS — BP 111/71 | HR 61 | Ht 71.0 in | Wt 181.0 lb

## 2022-06-21 DIAGNOSIS — R55 Syncope and collapse: Secondary | ICD-10-CM | POA: Diagnosis not present

## 2022-06-21 DIAGNOSIS — R569 Unspecified convulsions: Secondary | ICD-10-CM

## 2022-06-21 NOTE — Progress Notes (Signed)
[facility]  PATIENT: Brett Dawson DOB: [birth year]  REQUESTING CLINICIAN: [name], MD HISTORY FROM: Patient  REASON FOR VISIT: Recurrent syncope    HISTORICAL  CHIEF COMPLAINT:  Chief Complaint  Patient presents with   New Patient (Initial Visit)    Patient in room #12 and alone. Patient here today to discuss syncopal episodes with shaking, possible seizures.    HISTORY OF PRESENT ILLNESS:  This is a 67 year old gentleman with past medical history of esophageal cancer, GERD, syncope, heart disease who is presenting for evaluation of syncopal episodes.  He reports a longstanding history of syncope starting as childhood.  Reported at that time he was evaluated but could not find the causes of his syncopal episode.  He did well, did not have any recurrent events until the last 5 years.  He reported in the last 3 years he had 4 syncopal episodes.  The first few episodes happened when he was at the bar, he stood up and fell, he was told that he might have some shaking.  He was evaluated by cardiology, had a cardiac monitor but was told that everything was normal but from chart review he does have sinus bradycardia and PVCs. Patient reports during his last event, he was driving his bike and felt hot sweaty and swimmy headed.  He got off his bike and lay on the floor, the next thing that he remembers is paramedics taking him to the hospital.  He was told that at 1 point he was awake trying to get back on his bike but he does not remember that. He reports prior to his syncopal episodes, he will feel hot, sweaty and swimmy headed. There is also reports of confusion.  He denies any previous seizures, no seizures risk factors but states that father has seizures after struggling with brain cancer.     OTHER MEDICAL CONDITIONS: History of esophageal cancer, GERD, syncope   REVIEW OF SYSTEMS: Full 14 system review of systems performed and negative with exception of: As  noted in HPI  ALLERGIES: No Active Allergies  HOME MEDICATIONS: Outpatient Medications Prior to Visit  Medication Sig Dispense Refill   apixaban (ELIQUIS) 2.5 MG TABS tablet Take 1 tablet (2.5 mg total) by mouth 2 (two) times daily. 60 tablet 0   No facility-administered medications prior to visit.    PAST MEDICAL HISTORY: Past Medical History:  Diagnosis Date   Anemia    Aortic dilatation ([place])    a. mildly dilated by echo but CTA normal 11/2020.   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   Mild CAD    PAT (paroxysmal atrial tachycardia)    Premature atrial contractions    PVCs (premature ventricular contractions)    Sinus bradycardia     PAST SURGICAL HISTORY: Past Surgical History:  Procedure Laterality Date   BIOPSY  11/23/2019   Procedure: BIOPSY;  Surgeon: Arta Silence, MD;  Location: [facility];  Service: Endoscopy;;   ESOPHAGEAL STENT PLACEMENT N/A 04/11/2020   Procedure: ESOPHAGOGASTROSCOPY WITH ESOPHAGEAL STENT PLACEMENT;  Surgeon: Lajuana Matte, MD;  Location: [place] OR;  Service: Thoracic;  Laterality: N/A;   ESOPHAGEAL STENT PLACEMENT N/A 05/29/2020   Procedure: ESOPHAGEAL STENT REMOVAL;  Surgeon: Lajuana Matte, MD;  Location: [facility];  Service: Thoracic;  Laterality: N/A;   ESOPHAGOGASTRODUODENOSCOPY N/A 02/24/2020  Procedure: ESOPHAGOGASTRODUODENOSCOPY (EGD);  Surgeon: Lajuana Matte, MD;  Location: [facility] OR;  Service: Thoracic;  Laterality: N/A;   ESOPHAGOGASTRODUODENOSCOPY N/A 05/29/2020   Procedure: ESOPHAGOGASTRODUODENOSCOPY (EGD);  Surgeon: Lajuana Matte, MD;  Location: [facility] OR;  Service: Thoracic;  Laterality: N/A;   ESOPHAGOGASTRODUODENOSCOPY (EGD) WITH PROPOFOL N/A 11/23/2019   Procedure: ESOPHAGOGASTRODUODENOSCOPY (EGD) WITH PROPOFOL;  Surgeon: Arta Silence, MD;  Location: [facility];  Service: Endoscopy;   Laterality: N/A;   INTERCOSTAL NERVE BLOCK Right 02/24/2020   Procedure: INTERCOSTAL NERVE BLOCK;  Surgeon: Lajuana Matte, MD;  Location: [place];  Service: Thoracic;  Laterality: Right;   IR EMBO ART  VEN HEMORR LYMPH EXTRAV  INC GUIDE ROADMAPPING  04/07/2020   IR FLUORO GUIDED NEEDLE PLC ASPIRATION/INJECTION LOC  03/23/2020   IR LYMPHANGIOGRAM PEL/ABD BILAT  03/23/2020   IR LYMPHANGIOGRAM PEL/ABD BILAT  04/07/2020   IR US GUIDANCE  03/23/2020   IR US GUIDANCE  03/23/2020   IR US GUIDE VASC ACCESS LEFT  04/07/2020   IR US GUIDE VASC ACCESS RIGHT  04/07/2020   RADIOLOGY WITH ANESTHESIA N/A 03/23/2020   Procedure: Malignant neoplasm of esophagus;  Surgeon: Sandi Mariscal, MD;  Location: [facility];  Service: Radiology;  Laterality: N/A;   RADIOLOGY WITH ANESTHESIA N/A 04/07/2020   Procedure: IR WITH ANESTHESIA LYMPHANGIOGRAM BILATERAL WITH EMBOLIZATION;  Surgeon: Criselda Peaches, MD;  Location: [place];  Service: Radiology;  Laterality: N/A;   TONSILLECTOMY     TOTAL HIP ARTHROPLASTY Right 01/16/2022   Procedure: RIGHT TOTAL HIP ARTHROPLASTY;  Surgeon: Rod Can, MD;  Location: [facility];  Service: Orthopedics;  Laterality: Right;   VIDEO ASSISTED THORACOSCOPY (VATS)/EMPYEMA Left 04/11/2020   Procedure: LEFT VIDEO ASSISTED THORACOSCOPY (VATS) WITH IRRIGATION AND CHEST TUBE PLACEMENT;  Surgeon: Lajuana Matte, MD;  Location: [facility] OR;  Service: Thoracic;  Laterality: Left;    FAMILY HISTORY: Family History  Problem Relation Age of Onset   Hyperlipidemia Mother    Heart attack Mother    CAD Mother     SOCIAL HISTORY: Social History   Socioeconomic History   Marital status: Legally Separated    Spouse name: Not on file   Number of children: 1   Years of education: Not on file   Highest education level: High school graduate  Occupational History   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  Tobacco Use   Smoking status: Never   Smokeless tobacco: Never  Vaping Use    Vaping Use: Former  Substance and Sexual Activity   Alcohol use: Not Currently   Drug use: Yes    Types: Marijuana    Comment: last time 02/2020   Sexual activity: Yes  Other Topics Concern   Not on file  Social History Narrative   Not on file   Social Determinants of Health   Financial Resource Strain: Low Risk  (12/14/2019)   Overall Financial Resource Strain (CARDIA)    Difficulty of Paying Living Expenses: Not hard at all  Food Insecurity: No Food Insecurity (05/16/2020)   Hunger Vital Sign    Worried About Running Out of Food in the Last Year: Never true    Ran Out of Food in the Last Year: Never true  Transportation Needs: Unknown (05/16/2020)   PRAPARE - Hydrologist (Medical): Not on file    Lack of Transportation (Non-Medical): No  Physical Activity: Not on file  Stress: No Stress Concern Present (12/14/2019)   [place]  Questionnaire    Feeling of Stress : Not at all  Social Connections: Moderately Isolated (12/14/2019)   Social Connection and Isolation Panel [NHANES]    Frequency of Communication with Friends and Family: More than three times a week    Frequency of Social Gatherings with Friends and Family: More than three times a week    Attends Religious Services: 1 to 4 times per year    Active Member of [employer] or Organizations: No    Attends Archivist Meetings: Never    Marital Status: Separated  Intimate Partner Violence: Not At Risk (12/14/2019)   Humiliation, Afraid, Rape, and Kick questionnaire    Fear of Current or Ex-Partner: No    Emotionally Abused: No    Physically Abused: No    Sexually Abused: No    PHYSICAL EXAM  GENERAL EXAM/CONSTITUTIONAL: Vitals:  Vitals:   06/21/22 0758  BP: 111/71  Pulse: 61  Weight: 181 lb (82.1 kg)  Height: 5' 11"$  (1.803 m)   Body mass index is 25.24 kg/m. Wt Readings from Last 3 Encounters:  06/21/22 181 lb (82.1 kg)   01/16/22 173 lb 4.5 oz (78.6 kg)  01/12/21 171 lb 3.2 oz (77.7 kg)   Patient is in no distress; well developed, nourished and groomed; neck is supple  EYES: Visual fields full to confrontation, Extraocular movements intacts,   MUSCULOSKELETAL: Gait, strength, tone, movements noted in Neurologic exam below  NEUROLOGIC: MENTAL STATUS:      No data to display         awake, alert, oriented to person, place and time recent and remote memory intact normal attention and concentration language fluent, comprehension intact, naming intact fund of knowledge appropriate  CRANIAL NERVE:  2nd, 3rd, 4th, 6th - Visual fields full to confrontation, extraocular muscles intact, no nystagmus 5th - facial sensation symmetric 7th - facial strength symmetric 8th - hearing intact 9th - palate elevates symmetrically, uvula midline 11th - shoulder shrug symmetric 12th - tongue protrusion midline  MOTOR:  normal bulk and tone, full strength in the BUE, BLE  SENSORY:  normal and symmetric to light touch  COORDINATION:  finger-nose-finger, fine finger movements normal  REFLEXES:  deep tendon reflexes present and symmetric  GAIT/STATION:  normal     DIAGNOSTIC DATA (LABS, IMAGING, TESTING) - I reviewed patient records, labs, notes, testing and imaging myself where available.  Lab Results  Component Value Date   WBC 4.9 01/18/2022   HGB 8.9 (L) 01/18/2022   HCT 25.9 (L) 01/18/2022   MCV 91.5 01/18/2022   PLT 125 (L) 01/18/2022      Component Value Date/Time   NA 135 01/17/2022 0113   NA 142 01/12/2021 1527   K 4.1 01/17/2022 0113   CL 107 01/17/2022 0113   CO2 23 01/17/2022 0113   GLUCOSE 199 (H) 01/17/2022 0113   BUN 12 01/17/2022 0113   BUN 16 01/12/2021 1527   CREATININE 0.80 01/17/2022 0113   CREATININE 0.73 03/12/2021 1203   CALCIUM 7.9 (L) 01/17/2022 0113   PROT 5.8 (L) 01/16/2022 0110   PROT 6.5 01/12/2021 1527   ALBUMIN 3.6 01/16/2022 0110   ALBUMIN 4.3  01/12/2021 1527   AST 22 01/16/2022 0110   AST 17 03/12/2021 1203   ALT 14 01/16/2022 0110   ALT 11 03/12/2021 1203   ALKPHOS 52 01/16/2022 0110   BILITOT 1.1 01/16/2022 0110   BILITOT 0.7 03/12/2021 1203   GFRNONAA >60 01/17/2022 0113   GFRNONAA >60 03/12/2021 1203  GFRAA >60 01/26/2020 1426   Lab Results  Component Value Date   CHOL 187 01/12/2021   HDL 56 01/12/2021   LDLCALC 116 (H) 01/12/2021   LDLDIRECT 111 (H) 01/12/2021   TRIG 79 01/12/2021   CHOLHDL 3.3 01/12/2021   Lab Results  Component Value Date   HGBA1C 5.4 06/16/2020   Lab Results  Component Value Date   VITAMINB12 213 09/18/2019   Lab Results  Component Value Date   TSH 1.240 01/12/2021     ASSESSMENT AND PLAN  67 y.o. year old male with history of syncope, esophageal cancer, bradycardia who is presenting for evaluation of recurrent syncope.  This last episode was associated with confusion.  Patient reports prior to syncope he will feel hot, sweaty and swimmy headed prior to passing out. His cardiac monitor show evidence of bradycardia.  At this time, we will proceed with routine EEG and MRI; if routine EEG is normal, then we will obtain a 3-day ambulatory EEG.  With his prolonged post ictal confusion I am concerned that these may be seizures.  I will contact the patient to go over the results.  I will see him in the office in 3 months for follow-up.   1. Seizure-like activity ([place])   2. Syncope, unspecified syncope type      Patient Instructions  Routine EEG, if normal we will proceed with ambulatory EEG MRI brain with and without contrast Contact me if you have another event Follow-up in 6 months or sooner if worse.  Orders Placed This Encounter  Procedures   MR BRAIN W WO CONTRAST   EEG adult    No orders of the defined types were placed in this encounter.   Return in about 6 months (around 12/20/2022).    Alric Ran, MD 06/21/2022, 8:31 AM  [facility] [address] [place], [place] [address] [phone]

## 2022-06-21 NOTE — Patient Instructions (Signed)
Routine EEG, if normal we will proceed with ambulatory EEG MRI brain with and without contrast Contact me if you have another event Follow-up in 6 months or sooner if worse.

## 2022-06-25 ENCOUNTER — Telehealth: Payer: Self-pay | Admitting: Neurology

## 2022-06-25 NOTE — Telephone Encounter (Signed)
medicare/medicaid NPR sent to [facility] [phone]

## 2022-07-02 ENCOUNTER — Ambulatory Visit (INDEPENDENT_AMBULATORY_CARE_PROVIDER_SITE_OTHER): Payer: Medicare Other | Admitting: Neurology

## 2022-07-02 ENCOUNTER — Encounter: Payer: Self-pay | Admitting: Physician Assistant

## 2022-07-02 DIAGNOSIS — R569 Unspecified convulsions: Secondary | ICD-10-CM

## 2022-07-02 NOTE — Procedures (Signed)
    History:  67 year old man with seizure like activity.   EEG classification:  Awake and asleep  Description of the recording: The background rhythms of this recording consists of a fairly well modulated medium amplitude background activity of 10 Hz. As the record progresses, the patient initially is in the waking state, but appears to enter the early stage II sleep during the recording, with rudimentary sleep spindles and vertex sharp wave activity seen. During the wakeful state, photic stimulation is performed, showing rhythmic activity, time locked with photic stimulation and nonepileptic. Hyperventilation was also performed, no abnormal response seen. No epileptiform discharges seen during this recording. There was no focal slowing.   Abnormality: None   Impression: This is essentially a normal EEG recording in the waking and sleeping state. No evidence of interictal epileptiform discharges. Normal EEGs, however, do not rule out epilepsy.    Alric Ran, MD [facility]

## 2022-07-11 ENCOUNTER — Ambulatory Visit
Admission: RE | Admit: 2022-07-11 | Discharge: 2022-07-11 | Disposition: A | Discharge status: Home / Self Care | Payer: Medicare Other | Source: Ambulatory Visit | Attending: Neurology | Admitting: Neurology

## 2022-07-11 DIAGNOSIS — R569 Unspecified convulsions: Secondary | ICD-10-CM

## 2022-07-11 MED ORDER — GADOPICLENOL 0.5 MMOL/ML IV SOLN
8.0000 mL | Freq: Once | INTRAVENOUS | Status: AC | PRN
  Administered 2022-07-11: 8 mL via INTRAVENOUS

## 2022-10-30 ENCOUNTER — Encounter: Payer: Self-pay | Admitting: Physician Assistant

## 2022-10-30 ENCOUNTER — Other Ambulatory Visit: Payer: Self-pay

## 2022-10-30 ENCOUNTER — Telehealth: Payer: Self-pay | Admitting: Hematology

## 2022-10-30 NOTE — Telephone Encounter (Signed)
Patient is aware of upcoming appointment times/dates.  

## 2022-11-13 ENCOUNTER — Telehealth: Payer: Self-pay | Admitting: Hematology

## 2022-11-15 ENCOUNTER — Ambulatory Visit: Payer: Medicare Other | Admitting: Hematology

## 2022-11-22 ENCOUNTER — Inpatient Hospital Stay: Payer: Medicare HMO

## 2022-11-22 ENCOUNTER — Other Ambulatory Visit: Payer: Self-pay

## 2022-11-22 ENCOUNTER — Inpatient Hospital Stay: Payer: Medicare HMO | Attending: Hematology | Admitting: Hematology

## 2022-11-22 ENCOUNTER — Encounter: Payer: Self-pay | Admitting: Physician Assistant

## 2022-11-22 ENCOUNTER — Encounter: Payer: Self-pay | Admitting: Hematology

## 2022-11-22 VITALS — BP 130/71 | HR 64 | Temp 98.3°F | Resp 18 | Ht 71.0 in | Wt 181.9 lb

## 2022-11-22 DIAGNOSIS — C155 Malignant neoplasm of lower third of esophagus: Secondary | ICD-10-CM

## 2022-11-22 DIAGNOSIS — Z79899 Other long term (current) drug therapy: Secondary | ICD-10-CM | POA: Insufficient documentation

## 2022-11-22 DIAGNOSIS — D500 Iron deficiency anemia secondary to blood loss (chronic): Secondary | ICD-10-CM | POA: Diagnosis not present

## 2022-11-22 DIAGNOSIS — Z923 Personal history of irradiation: Secondary | ICD-10-CM | POA: Diagnosis not present

## 2022-11-22 DIAGNOSIS — Z9221 Personal history of antineoplastic chemotherapy: Secondary | ICD-10-CM | POA: Diagnosis not present

## 2022-11-22 DIAGNOSIS — Z8501 Personal history of malignant neoplasm of esophagus: Secondary | ICD-10-CM | POA: Insufficient documentation

## 2022-11-22 DIAGNOSIS — Z7901 Long term (current) use of anticoagulants: Secondary | ICD-10-CM | POA: Insufficient documentation

## 2022-11-22 LAB — CBC WITH DIFFERENTIAL (CANCER CENTER ONLY)
Abs Immature Granulocytes: 0 10*3/uL (ref 0.00–0.07)
Basophils Absolute: 0 10*3/uL (ref 0.0–0.1)
Basophils Relative: 1 %
Eosinophils Absolute: 0.2 10*3/uL (ref 0.0–0.5)
Eosinophils Relative: 5 %
HCT: 30.8 % — ABNORMAL LOW (ref 39.0–52.0)
Hemoglobin: 10.3 g/dL — ABNORMAL LOW (ref 13.0–17.0)
Immature Granulocytes: 0 %
Lymphocytes Relative: 19 %
Lymphs Abs: 0.7 10*3/uL (ref 0.7–4.0)
MCH: 28.9 pg (ref 26.0–34.0)
MCHC: 33.4 g/dL (ref 30.0–36.0)
MCV: 86.3 fL (ref 80.0–100.0)
Monocytes Absolute: 0.4 10*3/uL (ref 0.1–1.0)
Monocytes Relative: 11 %
Neutro Abs: 2.4 10*3/uL (ref 1.7–7.7)
Neutrophils Relative %: 64 %
Platelet Count: 220 10*3/uL (ref 150–400)
RBC: 3.57 MIL/uL — ABNORMAL LOW (ref 4.22–5.81)
RDW: 16.2 % — ABNORMAL HIGH (ref 11.5–15.5)
WBC Count: 3.7 10*3/uL — ABNORMAL LOW (ref 4.0–10.5)
nRBC: 0 % (ref 0.0–0.2)

## 2022-11-22 LAB — CMP (CANCER CENTER ONLY)
ALT: 13 U/L (ref 0–44)
AST: 18 U/L (ref 15–41)
Albumin: 4.1 g/dL (ref 3.5–5.0)
Alkaline Phosphatase: 55 U/L (ref 38–126)
Anion gap: 4 — ABNORMAL LOW (ref 5–15)
BUN: 10 mg/dL (ref 8–23)
CO2: 29 mmol/L (ref 22–32)
Calcium: 9.2 mg/dL (ref 8.9–10.3)
Chloride: 108 mmol/L (ref 98–111)
Creatinine: 0.69 mg/dL (ref 0.61–1.24)
GFR, Estimated: >60 mL/min (ref >60)
Glucose, Bld: 91 mg/dL (ref 70–99)
Potassium: 5 mmol/L (ref 3.5–5.1)
Sodium: 141 mmol/L (ref 135–145)
Total Bilirubin: 0.3 mg/dL (ref 0.3–1.2)
Total Protein: 6.6 g/dL (ref 6.5–8.1)

## 2022-11-22 LAB — CEA (ACCESS): CEA (CHCC): 2.43 ng/mL (ref 0.00–5.00)

## 2022-11-22 NOTE — Progress Notes (Signed)
[facility]   [phone] [phone]   Clinic Follow up Note   Patient Care Team: Deatra James, MD as PCP - General (Family Medicine) Quintella Reichert, MD as PCP - Cardiology (Cardiology) Malachy Mood, MD as Consulting Physician (Oncology) Radonna Ricker, RN (Inactive) as Registered Nurse Kathi Der, MD as Consulting Physician (Gastroenterology) Anabel Bene, RD as Dietitian (Nutrition) Corliss Skains, MD as Consulting Physician (Cardiothoracic Surgery) Willis Modena, MD as Consulting Physician (Gastroenterology)  Date of Service:  11/22/2022  CHIEF COMPLAINT: f/u of Esophageal cancer   CURRENT THERAPY:  Surveillance  ASSESSMENT:  Brett Dawson is a 67 y.o. male with    1. Esophageal squamous cell carcinoma, in distal esophagus, cTxN1M0, ypT0N0 -He presented with symptoms in May 2021, but was not diagnosed until 11/23/19 EGD where path showed Invasive well-differentiated squamous cell carcinoma of esophagus which extend to stomach cardia.  -He was treated with concurrent chemoradiation with weekly carboplatin and Taxol 12/06/19-01/13/20, last chemo 01/03/2020, cycle 6 held due to neutropenia. -He underwent esophagectomy on 02/24/20. Path showed no residual carcinoma, 13 LN negative.  He had complete response to neoadjuvant therapy.  This is a excellent prognostic factors, predicts low risk of recurrence, although the risk is not zero. -Given complete response, he does not require adjuvant treatment. He is currently on surveillance.  -he lost f/u after his last visit in July 2022.  He came in today because he needs dental clearance. -He is clinically doing very well, exam was unremarkable, will repeat labs today.  I recommend last surveillance CT scan in the next few weeks. -It has been 3 years since his initial diagnosis, his risk of recurrence is low now with I will plan to see him back in 6 months, will follow-up for additional 2 more  years.   2. Iron deficiency anemia secondary to blood loss -The patient received blood transfusion on 09/27/19 and 1 dose Feraheme on 09/18/19 while in the hospital for symptomatic anemia. Due to persistent low iron and anemia, he received 2 more IV iron doses in 11/2019.  -S/p treatment and surgery, anemia much improved, now mild. He is no longer taking oral iron.     PLAN: -Lab today -I order CT CAP in 3 weeks for cancer surveillance  -lab and f/u in 6 months -I filled out his dental clearance form    SUMMARY OF ONCOLOGIC HISTORY: Oncology History Overview Note  Cancer Staging Malignant neoplasm of lower third of esophagus (HCC) Staging form: Esophagus - Squamous Cell Carcinoma, AJCC 8th Edition - Clinical stage from 11/23/2019: Stage Unknown (cTX, cN1, cM0) - Signed by Malachy Mood, MD on 11/26/2019     Malignant neoplasm of lower third of esophagus (HCC)  09/14/2019 Imaging   CT AP W Contrast 09/14/19 IMPRESSION: 1. Large hiatal hernia. 2. Hepatic and bilateral simple renal cysts. 3. Colonic diverticulosis. 4. Small fat containing left inguinal hernia.   11/10/2019 Imaging   CT Chest 11/10/19  IMPRESSION: 1. There is a large mass of the lower third of the esophagus, with ill-defined margins, measuring approximately 6.0 x 5.4 x 7.7 cm. This appears enlarged and more appreciably masslike than appearance on prior CT dated 09/14/2019. 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 2. There are prominent gastrohepatic ligament lymph nodes adjacent to the lesser curvature, concerning for nodal metastatic disease although not ideally imaged on this examination of the chest. 3. No evidence of metastatic disease within in the chest. 4. Coronary artery disease.  11/22/2019 PET scan   PET 11/22/19 IMPRESSION: 1. Intensely hypermetabolic (max SUV 20.2) lower thoracic esophageal 6.0 x 5.5 x 9.4 cm mass extending to the esophagogastric junction with probable involvement of  the gastric cardia, compatible with primary esophageal malignancy. 2. Hypermetabolic gastrohepatic ligament nodal metastases. 3. No hypermetabolic liver or other distant metastases. 4. Chronic findings include: Aortic Atherosclerosis (ICD10-I70.0). Coronary atherosclerosis. Marked diffuse colonic diverticulosis.   11/23/2019 Procedure   EGD with Upper Endoscopy by Dr Dulce Sellar 11/23/19 IMPRESSION - Partially obstructing, likely malignant esophageal tumor was found in the lower third of the esophagus. Biopsied. 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 Biopsied. - Normal duodenal bulb, first portion of the duodenum and second portion of the duodenum.   11/23/2019 Initial Biopsy   FINAL MICROSCOPIC DIAGNOSIS:   A. STOMACH, CARDIA, BIOPSY:  - Invasive well-differentiated squamous cell carcinoma.  See comment   B. ESOPHAGUS, DISTAL, BIOPSY:  - Invasive well-differentiated squamous cell carcinoma.  See comment     COMMENT:   A  B.   Dr. Berneice Heinrich reviewed the case and concurs with the diagnosis.  Dr. Dulce Sellar was paged on 11/24/2019.    11/23/2019 Cancer Staging   Staging form: Esophagus - Squamous Cell Carcinoma, AJCC 8th Edition - Clinical stage from 11/23/2019: Stage Unknown (cTX, cN1, cM0) - Signed by Malachy Mood, MD on 11/26/2019   11/24/2019 Initial Diagnosis   Malignant neoplasm of lower third of esophagus (HCC)   12/06/2019 - 01/03/2020 Chemotherapy   Concurrent chemoradiation with weekly carboplatin and Taxol starting 12/06/19 - 01/13/20, last chemo 01/03/2020, cycle 6 held due to neutropenia   12/07/2019 - 01/13/2020 Radiation Therapy   Concurrent chemoradiation with Dr Mitzi Hansen starting 12/07/19   02/17/2020 Imaging   PET  IMPRESSION: 1. Marked reduction in activity in the distal esophagus and extending into the stomach, maximum SUV 3.8, formerly 20.2. There does continue to be circumferential wall thickening in the distal esophagus. 2. Right gastric lymph node is stable in size at 1.1 cm  in short axis, but has decreased in activity from previous SUV of 4.8 to current maximum SUV of 2.2. 3. Other imaging findings of potential clinical significance: Aortic Atherosclerosis (ICD10-I70.0). Coronary atherosclerosis. Hepatic and renal cysts. Colonic diverticulosis. Stranding in the central mesentery with some scattered mesenteric lymph nodes favoring sclerosing mesenteritis.   02/24/2020 Pathology Results   XI ROBOTIC ASSISTED IVOR LEWIS ESOPHAGECTOMY   FINAL MICROSCOPIC DIAGNOSIS:   A. LYMPH NODE, CELIAC, EXCISION:  - Focal atypia.  - See comment.   B. LYMPH NODE, LEVEL 4, EXCISION:  - One benign lymph node (0/1).   C. ESOP HAGUS, ESOPHAGECTOMY:  - Ulcer with inflammation, fibrosis and dystrophic calcifications.  - Focal residual keratin with giant cell reaction.  - No residual viable carcinoma identified.  - Margins free of tumor.  - Thirteen benign lymph nodes (0/13).  - See oncology table.   D. ADDITIONAL DISTAL MARGIN, EXCISION:  - Benign stomach.  - No evidence of malignancy.   COMMENT:  A. The celiac lymph node specimen consists of blood and connective  tissue with no lymph node tissue identified.  There are several minute  fragments of epithelioid cells with mild atypia which are not sufficient  for diagnosis of malignancy.   02/24/2020 Cancer Staging   Staging form: Esophagus - Squamous Cell Carcinoma, AJCC 8th Edition - Pathologic stage from 02/24/2020: pT0, pN0, cM0 - Signed by Malachy Mood, MD on 05/10/2020   03/07/2020 Imaging   CT CAP  IMPRESSION: 1. Previous  esophagectomy and pull-through surgery. 2. Large loculated pleural fluid collection posteriorly on the left with air bubbles and air-fluid level consistent with empyema. The left-sided chest tube is not visibly in communication with the loculated collection. 3. Small amount of pleural fluid on the right with dependent pulmonary atelectasis. 4. Hypoperfusion of the ventral corner of the  spleen suggesting splenic infarct.        INTERVAL HISTORY:  Brett Dawson is here for a follow up of Esophageal cancer . He was last seen by me on 11/29/2020. He presents to the clinic alone. Pt state that he is having a dental procedure. Pt denies having issue with swallowing. Pt denies having any discomfort or pain in the abdomen.     All other systems were reviewed with the patient and are negative.  MEDICAL HISTORY:  Past Medical History:  Diagnosis Date   Anemia    Aortic dilatation (HCC)    a. mildly dilated by echo but CTA normal 11/2020.   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   Mild CAD    PAT (paroxysmal atrial tachycardia)    Premature atrial contractions    PVCs (premature ventricular contractions)    Sinus bradycardia     SURGICAL HISTORY: Past Surgical History:  Procedure Laterality Date   BIOPSY  11/23/2019   Procedure: BIOPSY;  Surgeon: Willis Modena, MD;  Location: [facility];  Service: Endoscopy;;   ESOPHAGEAL STENT PLACEMENT N/A 04/11/2020   Procedure: ESOPHAGOGASTROSCOPY WITH ESOPHAGEAL STENT PLACEMENT;  Surgeon: Corliss Skains, MD;  Location: [facility] OR;  Service: Thoracic;  Laterality: N/A;   ESOPHAGEAL STENT PLACEMENT N/A 05/29/2020   Procedure: ESOPHAGEAL STENT REMOVAL;  Surgeon: Corliss Skains, MD;  Location: [facility] OR;  Service: Thoracic;  Laterality: N/A;   ESOPHAGOGASTRODUODENOSCOPY N/A 02/24/2020   Procedure: ESOPHAGOGASTRODUODENOSCOPY (EGD);  Surgeon: Corliss Skains, MD;  Location: [facility] OR;  Service: Thoracic;  Laterality: N/A;   ESOPHAGOGASTRODUODENOSCOPY N/A 05/29/2020   Procedure: ESOPHAGOGASTRODUODENOSCOPY (EGD);  Surgeon: Corliss Skains, MD;  Location: [facility] OR;  Service: Thoracic;  Laterality: N/A;   ESOPHAGOGASTRODUODENOSCOPY (EGD) WITH PROPOFOL N/A 11/23/2019   Procedure: ESOPHAGOGASTRODUODENOSCOPY (EGD)  WITH PROPOFOL;  Surgeon: Willis Modena, MD;  Location: [facility];  Service: Endoscopy;  Laterality: N/A;   INTERCOSTAL NERVE BLOCK Right 02/24/2020   Procedure: INTERCOSTAL NERVE BLOCK;  Surgeon: Corliss Skains, MD;  Location: [facility] OR;  Service: Thoracic;  Laterality: Right;   IR EMBO ART  VEN HEMORR LYMPH EXTRAV  INC GUIDE ROADMAPPING  04/07/2020   IR FLUORO GUIDED NEEDLE PLC ASPIRATION/INJECTION LOC  03/23/2020   IR LYMPHANGIOGRAM PEL/ABD BILAT  03/23/2020   IR LYMPHANGIOGRAM PEL/ABD BILAT  04/07/2020   IR US GUIDANCE  03/23/2020   IR US GUIDANCE  03/23/2020   IR US GUIDE VASC ACCESS LEFT  04/07/2020   IR US GUIDE VASC ACCESS RIGHT  04/07/2020   RADIOLOGY WITH ANESTHESIA N/A 03/23/2020   Procedure: Malignant neoplasm of esophagus;  Surgeon: Simonne Come, MD;  Location: [facility] OR;  Service: Radiology;  Laterality: N/A;   RADIOLOGY WITH ANESTHESIA N/A 04/07/2020   Procedure: IR WITH ANESTHESIA LYMPHANGIOGRAM BILATERAL WITH EMBOLIZATION;  Surgeon: Sterling Big, MD;  Location: [facility] OR;  Service: Radiology;  Laterality: N/A;   TONSILLECTOMY     TOTAL HIP ARTHROPLASTY Right 01/16/2022   Procedure: RIGHT TOTAL HIP ARTHROPLASTY;  Surgeon: Samson Frederic, MD;  Location[facility] OR;  Service: Orthopedics;  Laterality: Right;   VIDEO ASSISTED THORACOSCOPY (VATS)/EMPYEMA Left 04/11/2020   Procedure: LEFT VIDEO ASSISTED THORACOSCOPY (VATS) WITH IRRIGATION AND CHEST TUBE PLACEMENT;  Surgeon: Corliss Skains, MD;  Location: [facility] OR;  Service: Thoracic;  Laterality: Left;    I have reviewed the social history and family history with the patient and they are unchanged from previous note.  ALLERGIES:  has no active allergies.  MEDICATIONS:  Current Outpatient Medications  Medication Sig Dispense Refill   apixaban (ELIQUIS) 2.5 MG TABS tablet Take 1 tablet (2.5 mg total) by mouth 2 (two) times daily. 60 tablet 0   No current facility-administered medications for this visit.    PHYSICAL  EXAMINATION: ECOG PERFORMANCE STATUS: 0 - Asymptomatic  Vitals:   11/22/22 0931  BP: 130/71  Pulse: 64  Resp: 18  Temp: 98.3 F (36.8 C)  SpO2: 98%   Wt Readings from Last 3 Encounters:  11/22/22 181 lb 14.4 oz (82.5 kg)  06/21/22 181 lb (82.1 kg)  01/16/22 173 lb 4.5 oz (78.6 kg)     GENERAL:alert, no distress and comfortable SKIN: skin color normal, no rashes or significant lesions EYES: normal, Conjunctiva are pink and non-injected, sclera clear  NEURO: alert & oriented x 3 with fluent speech NECK: (-)supple, thyroid normal size, non-tender, without nodularity LYMPH: (-) no palpable lymphadenopathy in the cervical, axillary  ABDOMEN:(-)abdomen soft, (-)non-tender and normal bowel sounds Musculoskeletal:no cyanosis of digits and no clubbing  NEURO: alert & oriented x 3 with fluent speech, no focal motor/sensory deficits  LABORATORY DATA:  I have reviewed the data as listed    Latest Ref Rng & Units 11/22/2022   10:05 AM 01/18/2022    1:30 AM 01/17/2022    1:13 AM  CBC  WBC 4.0 - 10.5 K/uL 3.7  4.9  6.3   Hemoglobin 13.0 - 17.0 g/dL 72.5  8.9  36.6   Hematocrit 39.0 - 52.0 % 30.8  25.9  30.0   Platelets 150 - 400 K/uL 220  125  160         Latest Ref Rng & Units 11/22/2022   10:05 AM 01/17/2022    1:13 AM 01/16/2022    1:10 AM  CMP  Glucose 70 - 99 mg/dL 91  440  347   BUN 8 - 23 mg/dL 10  12  11    Creatinine 0.61 - 1.24 mg/dL 4.25  9.56  3.87   Sodium 135 - 145 mmol/L 141  135  137   Potassium 3.5 - 5.1 mmol/L 5.0  4.1  3.9   Chloride 98 - 111 mmol/L 108  107  102   CO2 22 - 32 mmol/L 29  23  23    Calcium 8.9 - 10.3 mg/dL 9.2  7.9  8.5   Total Protein 6.5 - 8.1 g/dL 6.6   5.8   Total Bilirubin 0.3 - 1.2 mg/dL 0.3   1.1   Alkaline Phos 38 - 126 U/L 55   52   AST 15 - 41 U/L 18   22   ALT 0 - 44 U/L 13   14       RADIOGRAPHIC STUDIES: I have personally reviewed the radiological images as listed and agreed with the findings in the report. No results found.     Orders Placed This Encounter  Procedures   CBC with Differential (Cancer Center Only)    Standing Status:   Future  Number of Occurrences:   1    Standing Expiration Date:   11/22/2023   CMP (Cancer Center only)    Standing Status:   Future    Number of Occurrences:   1    Standing Expiration Date:   11/22/2023   [facility] (Access)-[facility] ONLY    Standing Status:   Future    Number of Occurrences:   1    Standing Expiration Date:   11/22/2023   All questions were answered. The patient knows to call the clinic with any problems, questions or concerns. No barriers to learning was detected. The total time spent in the appointment was 25 minutes.     Malachy Mood, MD 11/22/2022   Carolin Coy, CMA, am acting as scribe for Malachy Mood, MD.   I have reviewed the above documentation for accuracy and completeness, and I agree with the above.

## 2022-11-24 ENCOUNTER — Encounter: Payer: Self-pay | Admitting: Physician Assistant

## 2022-11-25 ENCOUNTER — Telehealth: Payer: Self-pay | Admitting: Hematology

## 2022-11-29 ENCOUNTER — Other Ambulatory Visit: Payer: Self-pay

## 2022-12-03 ENCOUNTER — Other Ambulatory Visit: Payer: Self-pay

## 2022-12-10 ENCOUNTER — Other Ambulatory Visit: Payer: Self-pay

## 2022-12-10 DIAGNOSIS — C155 Malignant neoplasm of lower third of esophagus: Secondary | ICD-10-CM

## 2022-12-11 ENCOUNTER — Inpatient Hospital Stay: Payer: Medicare HMO | Attending: Hematology

## 2022-12-11 ENCOUNTER — Ambulatory Visit (HOSPITAL_COMMUNITY)
Admission: RE | Admit: 2022-12-11 | Discharge: 2022-12-11 | Disposition: A | Discharge status: Home / Self Care | Payer: Medicare HMO | Source: Ambulatory Visit | Attending: Hematology | Admitting: Hematology

## 2022-12-11 ENCOUNTER — Other Ambulatory Visit: Payer: Self-pay

## 2022-12-11 DIAGNOSIS — C155 Malignant neoplasm of lower third of esophagus: Secondary | ICD-10-CM | POA: Insufficient documentation

## 2022-12-11 DIAGNOSIS — Z8501 Personal history of malignant neoplasm of esophagus: Secondary | ICD-10-CM | POA: Insufficient documentation

## 2022-12-11 DIAGNOSIS — D500 Iron deficiency anemia secondary to blood loss (chronic): Secondary | ICD-10-CM | POA: Diagnosis not present

## 2022-12-11 DIAGNOSIS — K7689 Other specified diseases of liver: Secondary | ICD-10-CM | POA: Diagnosis not present

## 2022-12-11 DIAGNOSIS — K5730 Diverticulosis of large intestine without perforation or abscess without bleeding: Secondary | ICD-10-CM | POA: Diagnosis not present

## 2022-12-11 DIAGNOSIS — J90 Pleural effusion, not elsewhere classified: Secondary | ICD-10-CM | POA: Diagnosis not present

## 2022-12-11 LAB — CBC WITH DIFFERENTIAL (CANCER CENTER ONLY)
Abs Immature Granulocytes: 0.01 10*3/uL (ref 0.00–0.07)
Basophils Absolute: 0 10*3/uL (ref 0.0–0.1)
Basophils Relative: 1 %
Eosinophils Absolute: 0.3 10*3/uL (ref 0.0–0.5)
Eosinophils Relative: 6 %
HCT: 34.2 % — ABNORMAL LOW (ref 39.0–52.0)
Hemoglobin: 11.5 g/dL — ABNORMAL LOW (ref 13.0–17.0)
Immature Granulocytes: 0 %
Lymphocytes Relative: 24 %
Lymphs Abs: 1.1 10*3/uL (ref 0.7–4.0)
MCH: 29 pg (ref 26.0–34.0)
MCHC: 33.6 g/dL (ref 30.0–36.0)
MCV: 86.4 fL (ref 80.0–100.0)
Monocytes Absolute: 0.5 10*3/uL (ref 0.1–1.0)
Monocytes Relative: 11 %
Neutro Abs: 2.5 10*3/uL (ref 1.7–7.7)
Neutrophils Relative %: 58 %
Platelet Count: 227 10*3/uL (ref 150–400)
RBC: 3.96 MIL/uL — ABNORMAL LOW (ref 4.22–5.81)
RDW: 15.7 % — ABNORMAL HIGH (ref 11.5–15.5)
WBC Count: 4.4 10*3/uL (ref 4.0–10.5)
nRBC: 0 % (ref 0.0–0.2)

## 2022-12-11 LAB — CMP (CANCER CENTER ONLY)
ALT: 12 U/L (ref 0–44)
AST: 19 U/L (ref 15–41)
Albumin: 4.5 g/dL (ref 3.5–5.0)
Alkaline Phosphatase: 58 U/L (ref 38–126)
Anion gap: 6 (ref 5–15)
BUN: 13 mg/dL (ref 8–23)
CO2: 30 mmol/L (ref 22–32)
Calcium: 9.4 mg/dL (ref 8.9–10.3)
Chloride: 104 mmol/L (ref 98–111)
Creatinine: 0.82 mg/dL (ref 0.61–1.24)
GFR, Estimated: >60 mL/min (ref >60)
Glucose, Bld: 155 mg/dL — ABNORMAL HIGH (ref 70–99)
Potassium: 4.5 mmol/L (ref 3.5–5.1)
Sodium: 140 mmol/L (ref 135–145)
Total Bilirubin: 0.4 mg/dL (ref 0.3–1.2)
Total Protein: 7.2 g/dL (ref 6.5–8.1)

## 2022-12-11 MED ORDER — IOHEXOL 300 MG/ML  SOLN
100.0000 mL | Freq: Once | INTRAMUSCULAR | Status: AC | PRN
  Administered 2022-12-11: 100 mL via INTRAVENOUS

## 2022-12-11 MED ORDER — SODIUM CHLORIDE (PF) 0.9 % IJ SOLN
INTRAMUSCULAR | Status: AC
  Filled 2022-12-11: qty 50

## 2022-12-16 ENCOUNTER — Telehealth: Payer: Self-pay

## 2022-12-16 NOTE — Telephone Encounter (Addendum)
Called to relay below results as per Dr/. Feng . Patient voiced full understanding.    ----- Message from Malachy Mood sent at 12/15/2022  6:44 PM EDT ----- Please let pt know his CT result, no concerns, will see him back next year as scheduled.   Malachy Mood

## 2022-12-24 ENCOUNTER — Encounter: Payer: Self-pay | Admitting: Physician Assistant

## 2022-12-31 ENCOUNTER — Ambulatory Visit: Payer: Medicare Other | Admitting: Neurology

## 2023-02-12 DIAGNOSIS — K219 Gastro-esophageal reflux disease without esophagitis: Secondary | ICD-10-CM | POA: Diagnosis not present

## 2023-02-12 DIAGNOSIS — I700 Atherosclerosis of aorta: Secondary | ICD-10-CM | POA: Diagnosis not present

## 2023-02-12 DIAGNOSIS — Z0000 Encounter for general adult medical examination without abnormal findings: Secondary | ICD-10-CM | POA: Diagnosis not present

## 2023-02-12 DIAGNOSIS — Z1331 Encounter for screening for depression: Secondary | ICD-10-CM | POA: Diagnosis not present

## 2023-02-12 DIAGNOSIS — Z125 Encounter for screening for malignant neoplasm of prostate: Secondary | ICD-10-CM | POA: Diagnosis not present

## 2023-03-21 ENCOUNTER — Encounter (HOSPITAL_COMMUNITY): Payer: Self-pay

## 2023-03-21 ENCOUNTER — Emergency Department (HOSPITAL_COMMUNITY)
Admission: EM | Admit: 2023-03-21 | Discharge: 2023-03-22 | Disposition: A | Discharge status: Home / Self Care | Payer: Medicare HMO | Attending: Student | Admitting: Student

## 2023-03-21 ENCOUNTER — Other Ambulatory Visit: Payer: Self-pay

## 2023-03-21 ENCOUNTER — Encounter: Payer: Self-pay | Admitting: Physician Assistant

## 2023-03-21 DIAGNOSIS — I2510 Atherosclerotic heart disease of native coronary artery without angina pectoris: Secondary | ICD-10-CM | POA: Diagnosis not present

## 2023-03-21 DIAGNOSIS — R55 Syncope and collapse: Secondary | ICD-10-CM | POA: Diagnosis not present

## 2023-03-21 DIAGNOSIS — Z7901 Long term (current) use of anticoagulants: Secondary | ICD-10-CM | POA: Insufficient documentation

## 2023-03-21 DIAGNOSIS — R61 Generalized hyperhidrosis: Secondary | ICD-10-CM | POA: Diagnosis not present

## 2023-03-21 DIAGNOSIS — Z8501 Personal history of malignant neoplasm of esophagus: Secondary | ICD-10-CM | POA: Insufficient documentation

## 2023-03-21 DIAGNOSIS — R42 Dizziness and giddiness: Secondary | ICD-10-CM | POA: Diagnosis not present

## 2023-03-21 DIAGNOSIS — R231 Pallor: Secondary | ICD-10-CM | POA: Diagnosis not present

## 2023-03-21 DIAGNOSIS — I959 Hypotension, unspecified: Secondary | ICD-10-CM | POA: Diagnosis not present

## 2023-03-21 NOTE — ED Triage Notes (Addendum)
Pt arrived via EMS from restaurant. Pt had a couple sips of their beer before becoming diaphoretic, gray, unable to stay in stool. Pt helped into chair. Pt denies losing consciousness. HX of syncope. EMS states pt has positive orthostatic vitals. CBG 120. Given 300 ml of LR.

## 2023-03-22 ENCOUNTER — Encounter: Payer: Self-pay | Admitting: Physician Assistant

## 2023-03-22 LAB — CBC
HCT: 31.3 % — ABNORMAL LOW (ref 39.0–52.0)
Hemoglobin: 10.1 g/dL — ABNORMAL LOW (ref 13.0–17.0)
MCH: 28.8 pg (ref 26.0–34.0)
MCHC: 32.3 g/dL (ref 30.0–36.0)
MCV: 89.2 fL (ref 80.0–100.0)
Platelets: 214 10*3/uL (ref 150–400)
RBC: 3.51 MIL/uL — ABNORMAL LOW (ref 4.22–5.81)
RDW: 17 % — ABNORMAL HIGH (ref 11.5–15.5)
WBC: 6.1 10*3/uL (ref 4.0–10.5)
nRBC: 0 % (ref 0.0–0.2)

## 2023-03-22 LAB — URINALYSIS, ROUTINE W REFLEX MICROSCOPIC
Bacteria, UA: NONE SEEN
Bilirubin Urine: NEGATIVE
Glucose, UA: NEGATIVE mg/dL
Hgb urine dipstick: NEGATIVE
Ketones, ur: NEGATIVE mg/dL
Leukocytes,Ua: NEGATIVE
Nitrite: NEGATIVE
Protein, ur: 100 mg/dL — AB
Specific Gravity, Urine: 1.021 (ref 1.005–1.030)
pH: 5 (ref 5.0–8.0)

## 2023-03-22 LAB — CBG MONITORING, ED: Glucose-Capillary: 80 mg/dL (ref 70–99)

## 2023-03-22 LAB — BASIC METABOLIC PANEL
Anion gap: 7 (ref 5–15)
BUN: 16 mg/dL (ref 8–23)
CO2: 26 mmol/L (ref 22–32)
Calcium: 8.7 mg/dL — ABNORMAL LOW (ref 8.9–10.3)
Chloride: 103 mmol/L (ref 98–111)
Creatinine, Ser: 0.88 mg/dL (ref 0.61–1.24)
GFR, Estimated: >60 mL/min (ref >60)
Glucose, Bld: 73 mg/dL (ref 70–99)
Potassium: 3.5 mmol/L (ref 3.5–5.1)
Sodium: 136 mmol/L (ref 135–145)

## 2023-03-22 LAB — TROPONIN I (HIGH SENSITIVITY): Troponin I (High Sensitivity): 4 ng/L (ref <18)

## 2023-03-22 LAB — D-DIMER, QUANTITATIVE: D-Dimer, Quant: <0.27 ug{FEU}/mL (ref 0.00–0.50)

## 2023-03-22 MED ORDER — GABAPENTIN 100 MG PO CAPS
100.0000 mg | ORAL_CAPSULE | Freq: Once | ORAL | Status: AC
Start: 2023-03-22 — End: 2023-03-22
  Administered 2023-03-22: 100 mg via ORAL
  Filled 2023-03-22: qty 1

## 2023-03-22 NOTE — ED Provider Notes (Signed)
[place] EMERGENCY DEPARTMENT AT [facility] Provider Note  CSN: [id number] Arrival date & time: 03/21/23 2338  Chief Complaint(s) Hypotension  HPI Brett Dawson is a 67 y.o. male with a PMH anemia, gout, esophageal cancer status post esophagectomy, paroxysmal atrial tachycardia, recurrent syncope who presents emergency room for evaluation of a syncopal event.  Patient states that he was at the bar drinking a beer when he had a prodrome of lightheadedness and a feeling like he was going to pass out.  He remained presyncopal but pale and diaphoretic and EMS was called.  Patient reportedly orthostatic by EMS and received 300 cc of LR prior to arrival.  Here in the emergency room, he states that his symptoms have resolved and he currently is denying any chest pain, shortness of breath, abdominal pain, nausea, vomiting, headache, fever or other systemic symptoms.   Past Medical History Past Medical History:  Diagnosis Date   Anemia    Aortic dilatation (HCC)    a. mildly dilated by echo but CTA normal 11/2020.   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   Mild CAD    PAT (paroxysmal atrial tachycardia) (HCC)    Premature atrial contractions    PVCs (premature ventricular contractions)    Sinus bradycardia    Patient Active Problem List   Diagnosis Date Noted   Acute right hip pain 01/16/2022   Fall at home, initial encounter 01/16/2022   HLD (hyperlipidemia) 01/16/2022   Anemia of chronic disease 01/16/2022   Closed fracture of neck of right femur (HCC) 01/15/2022   Protein-calorie malnutrition, severe 06/15/2020   Postoperative wound infection 06/13/2020   Wound infection 06/13/2020   Encounter for attention to other artificial openings of digestive tract (HCC) 06/01/2020   History of excision of intestinal structure 06/01/2020   Malignant tumor of  cardia (HCC) 06/01/2020   Esophagectomy, anastomotic leak 05/29/2020   Constipation 05/16/2020   Gastroesophageal reflux disease 05/16/2020   Iron deficiency anemia 05/16/2020   Obesity 05/16/2020   Periodontitis 05/16/2020   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 Authorizing Provider  apixaban (ELIQUIS) 2.5 MG TABS tablet Take 1 tablet (2.5 mg total) by mouth 2 (two) times daily. 01/18/22 02/17/22  Sabas Sous                                                                                                                                    Past Surgical History Past Surgical History:  Procedure  Laterality Date   BIOPSY  11/23/2019   Procedure: BIOPSY;  Surgeon: Willis Modena, MD;  Location: [facility];  Service: Endoscopy;;   ESOPHAGEAL STENT PLACEMENT N/A 04/11/2020   Procedure: ESOPHAGOGASTROSCOPY WITH ESOPHAGEAL STENT PLACEMENT;  Surgeon: Corliss Skains, MD;  Location: [facility] OR;  Service: Thoracic;  Laterality: N/A;   ESOPHAGEAL STENT PLACEMENT N/A 05/29/2020   Procedure: ESOPHAGEAL STENT REMOVAL;  Surgeon: Corliss Skains, MD;  Location: [facility] OR;  Service: Thoracic;  Laterality: N/A;   ESOPHAGOGASTRODUODENOSCOPY N/A 02/24/2020   Procedure: ESOPHAGOGASTRODUODENOSCOPY (EGD);  Surgeon: Corliss Skains, MD;  Location: [facility] OR;  Service: Thoracic;  Laterality: N/A;   ESOPHAGOGASTRODUODENOSCOPY N/A 05/29/2020   Procedure: ESOPHAGOGASTRODUODENOSCOPY (EGD);  Surgeon: Corliss Skains, MD;  Location: [facility] OR;  Service: Thoracic;  Laterality: N/A;   ESOPHAGOGASTRODUODENOSCOPY (EGD) WITH PROPOFOL N/A 11/23/2019   Procedure: ESOPHAGOGASTRODUODENOSCOPY (EGD) WITH PROPOFOL;  Surgeon: Willis Modena, MD;   Location: [facility];  Service: Endoscopy;  Laterality: N/A;   INTERCOSTAL NERVE BLOCK Right 02/24/2020   Procedure: INTERCOSTAL NERVE BLOCK;  Surgeon: Corliss Skains, MD;  Location: [facility] OR;  Service: Thoracic;  Laterality: Right;   IR EMBO ART  VEN HEMORR LYMPH EXTRAV  INC GUIDE ROADMAPPING  04/07/2020   IR FLUORO GUIDED NEEDLE PLC ASPIRATION/INJECTION LOC  03/23/2020   IR LYMPHANGIOGRAM PEL/ABD BILAT  03/23/2020   IR LYMPHANGIOGRAM PEL/ABD BILAT  04/07/2020   IR US GUIDANCE  03/23/2020   IR US GUIDANCE  03/23/2020   IR US GUIDE VASC ACCESS LEFT  04/07/2020   IR US GUIDE VASC ACCESS RIGHT  04/07/2020   RADIOLOGY WITH ANESTHESIA N/A 03/23/2020   Procedure: Malignant neoplasm of esophagus;  Surgeon: Simonne Come, MD;  Location: [facility] OR;  Service: Radiology;  Laterality: N/A;   RADIOLOGY WITH ANESTHESIA N/A 04/07/2020   Procedure: IR WITH ANESTHESIA LYMPHANGIOGRAM BILATERAL WITH EMBOLIZATION;  Surgeon: Sterling Big, MD;  Location: [facility] OR;  Service: Radiology;  Laterality: N/A;   TONSILLECTOMY     TOTAL HIP ARTHROPLASTY Right 01/16/2022   Procedure: RIGHT TOTAL HIP ARTHROPLASTY;  Surgeon: Samson Frederic, MD;  Location: [facility] OR;  Service: Orthopedics;  Laterality: Right;   VIDEO ASSISTED THORACOSCOPY (VATS)/EMPYEMA Left 04/11/2020   Procedure: LEFT VIDEO ASSISTED THORACOSCOPY (VATS) WITH IRRIGATION AND CHEST TUBE PLACEMENT;  Surgeon: Corliss Skains, MD;  Location: [facility] OR;  Service: Thoracic;  Laterality: Left;   Family History Family History  Problem Relation Age of Onset   Hyperlipidemia Mother    Heart attack Mother    CAD Mother     Social History Social History   Tobacco Use   Smoking status: Never   Smokeless tobacco: Never  Vaping Use   Vaping status: Former  Substance Use Topics   Alcohol use: Not Currently   Drug use: Yes    Types: Marijuana    Comment: last time 02/2020   Allergies Patient has no active allergies.  Review of Systems Review of Systems   Neurological:  Positive for syncope and light-headedness.    Physical Exam Vital Signs  I have reviewed the triage vital signs BP 130/78   Pulse 62   Temp 98.5 F (36.9 C) (Oral)   Resp 11   SpO2 98%   Physical Exam Constitutional:      General: He is not in acute distress.    Appearance: Normal appearance.  HENT:     Head: Normocephalic and atraumatic.     Nose: No congestion or rhinorrhea.  Eyes:     General:  Right eye: No discharge.        Left eye: No discharge.     Extraocular Movements: Extraocular movements intact.     Pupils: Pupils are equal, round, and reactive to light.  Cardiovascular:     Rate and Rhythm: Normal rate and regular rhythm.     Heart sounds: No murmur heard. Pulmonary:     Effort: No respiratory distress.     Breath sounds: No wheezing or rales.  Abdominal:     General: There is no distension.     Tenderness: There is no abdominal tenderness.  Musculoskeletal:        General: Normal range of motion.     Cervical back: Normal range of motion.  Skin:    General: Skin is warm and dry.  Neurological:     General: No focal deficit present.     Mental Status: He is alert.     ED Results and Treatments Labs (all labs ordered are listed, but only abnormal results are displayed) Labs Reviewed  BASIC METABOLIC PANEL - Abnormal; Notable for the following components:      Result Value   Calcium 8.7 (*)    All other components within normal limits  CBC - Abnormal; Notable for the following components:   RBC 3.51 (*)    Hemoglobin 10.1 (*)    HCT 31.3 (*)    RDW 17.0 (*)    All other components within normal limits  URINALYSIS, ROUTINE W REFLEX MICROSCOPIC - Abnormal; Notable for the following components:   APPearance HAZY (*)    Protein, ur 100 (*)    All other components within normal limits  D-DIMER, QUANTITATIVE  CBG MONITORING, ED  TROPONIN I (HIGH SENSITIVITY)  TROPONIN I (HIGH SENSITIVITY)                                                                                                                           Radiology No results found.  Pertinent labs & imaging results that were available during my care of the patient were reviewed by me and considered in my medical decision making (see MDM for details).  Medications Ordered in ED Medications  gabapentin (NEURONTIN) capsule 100 mg (100 mg Oral Given 03/22/23 0057)  Procedures Procedures  (including critical care time)  Medical Decision Making / ED Course   This patient presents to the ED for concern of presyncope, this involves an extensive number of treatment options, and is a complaint that carries with it a high risk of complications and morbidity.  The differential diagnosis includes orthostatic presyncope, cardiogenic presyncope, vasovagal presyncope, electrolyte abnormality, dehydration, dysrhythmia, Hypoglycemia, Seizure, Autonomic Insufficiency  MDM: Patient seen emergency room for evaluation of a presyncopal episode.  Physical exam unremarkable with no murmur or irregularity heard on cardiopulmonary exam.  Laboratory evaluation with a hemoglobin of 10.1 which is within patient's baseline.  High-sensitivity troponin negative, D-dimer negative.  ECG nonischemic and without evidence of dysrhythmia.  Patient completed the rest of his 1 L bag by EMS and orthostatic vital signs were normal here in the emergency department.  He did complain of some persistent neuropathy and we trialed some low-dose gabapentin here without significant improvement.  Will be cautious and increasing doses for home use in the setting of his syncope.  At this time, patient presentation appears consistent with vasovagal presyncope as seen on previous presentations and with symptoms resolved he does not meet inpatient criteria for admission.  He will be  discharged with outpatient follow-up.  Return precautions given which she voiced understanding.   Additional history obtained:  -External records from outside source obtained and reviewed including: Chart review including previous notes, labs, imaging, consultation notes   Lab Tests: -I ordered, reviewed, and interpreted labs.   The pertinent results include:   Labs Reviewed  BASIC METABOLIC PANEL - Abnormal; Notable for the following components:      Result Value   Calcium 8.7 (*)    All other components within normal limits  CBC - Abnormal; Notable for the following components:   RBC 3.51 (*)    Hemoglobin 10.1 (*)    HCT 31.3 (*)    RDW 17.0 (*)    All other components within normal limits  URINALYSIS, ROUTINE W REFLEX MICROSCOPIC - Abnormal; Notable for the following components:   APPearance HAZY (*)    Protein, ur 100 (*)    All other components within normal limits  D-DIMER, QUANTITATIVE  CBG MONITORING, ED  TROPONIN I (HIGH SENSITIVITY)  TROPONIN I (HIGH SENSITIVITY)      EKG   EKG Interpretation Date/Time:  Friday March 21 2023 23:40:47 EST Ventricular Rate:  66 PR Interval:  187 QRS Duration:  114 QT Interval:  422 QTC Calculation: 443 R Axis:   69  Text Interpretation: Sinus rhythm Incomplete right bundle branch block Confirmed by Shirah Roseman ([id number]) on 03/22/2023 12:45:29 AM         Medicines ordered and prescription drug management: Meds ordered this encounter  Medications   gabapentin (NEURONTIN) capsule 100 mg    -I have reviewed the patients home medicines and have made adjustments as needed  Critical interventions none   Cardiac Monitoring: The patient was maintained on a cardiac monitor.  I personally viewed and interpreted the cardiac monitored which showed an underlying rhythm of: NSR  Social Determinants of Health:  Factors impacting patients care include: none   Reevaluation: After the interventions noted above, I  reevaluated the patient and found that they have :improved  Co morbidities that complicate the patient evaluation  Past Medical History:  Diagnosis Date   Anemia    Aortic dilatation (HCC)    a. mildly dilated by echo but CTA normal 11/2020.   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

## 2023-04-07 DIAGNOSIS — D509 Iron deficiency anemia, unspecified: Secondary | ICD-10-CM | POA: Diagnosis not present

## 2023-04-07 DIAGNOSIS — R001 Bradycardia, unspecified: Secondary | ICD-10-CM | POA: Diagnosis not present

## 2023-04-07 DIAGNOSIS — R55 Syncope and collapse: Secondary | ICD-10-CM | POA: Diagnosis not present

## 2023-05-29 ENCOUNTER — Inpatient Hospital Stay: Payer: Medicare HMO | Attending: Hematology | Admitting: Hematology

## 2023-05-29 ENCOUNTER — Inpatient Hospital Stay: Payer: Medicare HMO

## 2023-05-29 NOTE — Assessment & Plan Note (Deleted)
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  -He was treated with concurrent chemoradiation with weekly carboplatin and Taxol 12/06/19-01/13/20, last chemo 01/03/2020, cycle 6 held due to neutropenia. -He underwent esophagectomy on 02/24/20. Path showed no residual carcinoma, 13 LN negative.  He had complete response to neoadjuvant therapy.  This is a excellent prognostic factors, predicts low risk of recurrence, although the risk is not zero. -Given complete response, he does not require adjuvant treatment. He is currently on surveillance.  -last CT in 12/2022 was negative
